# Patient Record
Sex: Male | Born: 1955
Health system: Southern US, Community
[De-identification: ages and names within clinical notes are randomized; demographics above are authoritative.]

## PROBLEM LIST (undated history)

## (undated) DIAGNOSIS — K853 Drug induced acute pancreatitis without necrosis or infection: Secondary | ICD-10-CM

## (undated) DIAGNOSIS — M1990 Unspecified osteoarthritis, unspecified site: Secondary | ICD-10-CM

## (undated) DIAGNOSIS — M48061 Spinal stenosis, lumbar region without neurogenic claudication: Secondary | ICD-10-CM

## (undated) DIAGNOSIS — M87059 Idiopathic aseptic necrosis of unspecified femur: Secondary | ICD-10-CM

## (undated) DIAGNOSIS — E559 Vitamin D deficiency, unspecified: Secondary | ICD-10-CM

## (undated) DIAGNOSIS — K5150 Left sided colitis without complications: Secondary | ICD-10-CM

## (undated) DIAGNOSIS — N529 Male erectile dysfunction, unspecified: Secondary | ICD-10-CM

## (undated) DIAGNOSIS — E119 Type 2 diabetes mellitus without complications: Secondary | ICD-10-CM

## (undated) DIAGNOSIS — K649 Unspecified hemorrhoids: Secondary | ICD-10-CM

## (undated) DIAGNOSIS — I10 Essential (primary) hypertension: Secondary | ICD-10-CM

## (undated) DIAGNOSIS — K529 Noninfective gastroenteritis and colitis, unspecified: Secondary | ICD-10-CM

## (undated) DIAGNOSIS — F553 Abuse of steroids or hormones: Secondary | ICD-10-CM

## (undated) HISTORY — DX: Left sided colitis without complications: K51.50

## (undated) HISTORY — DX: Drug induced acute pancreatitis without necrosis or infection: K85.30

## (undated) HISTORY — DX: Abuse of steroids or hormones: F55.3

## (undated) HISTORY — DX: Unspecified osteoarthritis, unspecified site: M19.90

## (undated) HISTORY — DX: Spinal stenosis, lumbar region without neurogenic claudication: M48.061

## (undated) HISTORY — PX: COLONOSCOPY: SHX174

## (undated) HISTORY — DX: Noninfective gastroenteritis and colitis, unspecified: K52.9

## (undated) HISTORY — PX: SIGMOIDOSCOPY: SUR1295

## (undated) HISTORY — PX: KNEE ARTHROSCOPY: SUR90

## (undated) HISTORY — DX: Vitamin D deficiency, unspecified: E55.9

## (undated) HISTORY — DX: Type 2 diabetes mellitus without complications: E11.9

## (undated) HISTORY — DX: Essential (primary) hypertension: I10

## (undated) HISTORY — DX: Idiopathic aseptic necrosis of unspecified femur: M87.059

## (undated) HISTORY — DX: Unspecified hemorrhoids: K64.9

## (undated) HISTORY — DX: Male erectile dysfunction, unspecified: N52.9

---

## 1999-09-21 ENCOUNTER — Emergency Department (HOSPITAL_COMMUNITY): Admission: EM | Admit: 1999-09-21 | Discharge: 1999-09-21 | Payer: Self-pay | Admitting: Emergency Medicine

## 2000-08-16 ENCOUNTER — Ambulatory Visit (HOSPITAL_COMMUNITY): Admission: RE | Admit: 2000-08-16 | Discharge: 2000-08-16 | Payer: Self-pay | Admitting: Specialist

## 2003-09-25 ENCOUNTER — Emergency Department (HOSPITAL_COMMUNITY): Admission: EM | Admit: 2003-09-25 | Discharge: 2003-09-25 | Payer: Self-pay | Admitting: *Deleted

## 2004-03-14 ENCOUNTER — Encounter: Payer: Self-pay | Admitting: Internal Medicine

## 2004-04-07 ENCOUNTER — Ambulatory Visit: Payer: Self-pay | Admitting: Internal Medicine

## 2004-06-23 ENCOUNTER — Ambulatory Visit: Payer: Self-pay | Admitting: Internal Medicine

## 2004-08-01 ENCOUNTER — Ambulatory Visit: Payer: Self-pay | Admitting: Family Medicine

## 2004-08-10 ENCOUNTER — Ambulatory Visit: Payer: Self-pay | Admitting: Family Medicine

## 2004-08-24 ENCOUNTER — Ambulatory Visit: Payer: Self-pay | Admitting: Family Medicine

## 2005-03-13 ENCOUNTER — Ambulatory Visit: Payer: Self-pay | Admitting: Internal Medicine

## 2005-05-08 ENCOUNTER — Ambulatory Visit: Payer: Self-pay | Admitting: Internal Medicine

## 2005-09-04 ENCOUNTER — Ambulatory Visit: Payer: Self-pay | Admitting: Family Medicine

## 2005-09-11 ENCOUNTER — Ambulatory Visit: Payer: Self-pay | Admitting: Family Medicine

## 2006-04-16 ENCOUNTER — Ambulatory Visit: Payer: Self-pay | Admitting: Internal Medicine

## 2006-09-11 ENCOUNTER — Ambulatory Visit: Payer: Self-pay | Admitting: Family Medicine

## 2006-09-11 LAB — CONVERTED CEMR LAB
ALT: 25 units/L (ref 0–40)
AST: 32 units/L (ref 0–37)
Albumin: 3.7 g/dL (ref 3.5–5.2)
Alkaline Phosphatase: 40 units/L (ref 39–117)
BUN: 12 mg/dL (ref 6–23)
Basophils Absolute: 0 10*3/uL (ref 0.0–0.1)
Basophils Relative: 0.4 % (ref 0.0–1.0)
Bilirubin, Direct: 0.1 mg/dL (ref 0.0–0.3)
CO2: 32 meq/L (ref 19–32)
Calcium: 9.3 mg/dL (ref 8.4–10.5)
Chloride: 106 meq/L (ref 96–112)
Cholesterol: 165 mg/dL (ref 0–200)
Creatinine, Ser: 0.9 mg/dL (ref 0.4–1.5)
Eosinophils Absolute: 0.4 10*3/uL (ref 0.0–0.6)
Eosinophils Relative: 8.8 % — ABNORMAL HIGH (ref 0.0–5.0)
GFR calc Af Amer: 114 mL/min
GFR calc non Af Amer: 94 mL/min
Glucose, Bld: 88 mg/dL (ref 70–99)
HCT: 42.2 % (ref 39.0–52.0)
HDL: 45.8 mg/dL (ref >39.0)
Hemoglobin: 14.4 g/dL (ref 13.0–17.0)
LDL Cholesterol: 107 mg/dL — ABNORMAL HIGH (ref 0–99)
Lymphocytes Relative: 29.3 % (ref 12.0–46.0)
MCHC: 34.2 g/dL (ref 30.0–36.0)
MCV: 90.9 fL (ref 78.0–100.0)
Monocytes Absolute: 0.5 10*3/uL (ref 0.2–0.7)
Monocytes Relative: 11.6 % — ABNORMAL HIGH (ref 3.0–11.0)
Neutro Abs: 2.3 10*3/uL (ref 1.4–7.7)
Neutrophils Relative %: 49.9 % (ref 43.0–77.0)
PSA: 0.5 ng/mL (ref 0.10–4.00)
Platelets: 231 10*3/uL (ref 150–400)
Potassium: 3.8 meq/L (ref 3.5–5.1)
RBC: 4.64 M/uL (ref 4.22–5.81)
RDW: 12.1 % (ref 11.5–14.6)
Sodium: 142 meq/L (ref 135–145)
TSH: 1.19 microintl units/mL (ref 0.35–5.50)
Total Bilirubin: 0.9 mg/dL (ref 0.3–1.2)
Total CHOL/HDL Ratio: 3.6
Total Protein: 7.2 g/dL (ref 6.0–8.3)
Triglycerides: 60 mg/dL (ref 0–149)
VLDL: 12 mg/dL (ref 0–40)
WBC: 4.5 10*3/uL (ref 4.5–10.5)

## 2006-09-16 ENCOUNTER — Ambulatory Visit: Payer: Self-pay | Admitting: Family Medicine

## 2006-12-24 ENCOUNTER — Ambulatory Visit: Payer: Self-pay | Admitting: Internal Medicine

## 2006-12-24 LAB — CONVERTED CEMR LAB
Bilirubin, Direct: 0.1 mg/dL (ref 0.0–0.3)
CRP, High Sensitivity: 1 (ref 0.00–5.00)
Calcium: 9.1 mg/dL (ref 8.4–10.5)
Eosinophils Absolute: 0.2 10*3/uL (ref 0.0–0.6)
Eosinophils Relative: 3.5 % (ref 0.0–5.0)
GFR calc Af Amer: 90 mL/min
GFR calc non Af Amer: 75 mL/min
Glucose, Bld: 144 mg/dL — ABNORMAL HIGH (ref 70–99)
Lymphocytes Relative: 23.7 % (ref 12.0–46.0)
MCHC: 35.8 g/dL (ref 30.0–36.0)
MCV: 90.1 fL (ref 78.0–100.0)
Neutro Abs: 3.4 10*3/uL (ref 1.4–7.7)
Neutrophils Relative %: 63.1 % (ref 43.0–77.0)
Platelets: 191 10*3/uL (ref 150–400)
Sodium: 138 meq/L (ref 135–145)
WBC: 5.4 10*3/uL (ref 4.5–10.5)

## 2007-01-29 ENCOUNTER — Ambulatory Visit: Payer: Self-pay | Admitting: Internal Medicine

## 2007-03-26 ENCOUNTER — Ambulatory Visit: Payer: Self-pay | Admitting: Internal Medicine

## 2007-07-22 ENCOUNTER — Emergency Department (HOSPITAL_COMMUNITY): Admission: EM | Admit: 2007-07-22 | Discharge: 2007-07-22 | Payer: Self-pay | Admitting: Emergency Medicine

## 2007-08-05 ENCOUNTER — Ambulatory Visit: Payer: Self-pay | Admitting: Internal Medicine

## 2007-10-29 ENCOUNTER — Ambulatory Visit: Payer: Self-pay | Admitting: Family Medicine

## 2007-10-29 ENCOUNTER — Telehealth: Payer: Self-pay | Admitting: Family Medicine

## 2007-10-29 LAB — CONVERTED CEMR LAB
Albumin: 3.6 g/dL (ref 3.5–5.2)
Alkaline Phosphatase: 36 units/L — ABNORMAL LOW (ref 39–117)
BUN: 12 mg/dL (ref 6–23)
Eosinophils Absolute: 0.4 10*3/uL (ref 0.0–0.7)
Eosinophils Relative: 9.7 % — ABNORMAL HIGH (ref 0.0–5.0)
GFR calc Af Amer: 90 mL/min
GFR calc non Af Amer: 74 mL/min
HCT: 43.1 % (ref 39.0–52.0)
HDL: 30.6 mg/dL — ABNORMAL LOW (ref >39.0)
MCHC: 33.8 g/dL (ref 30.0–36.0)
MCV: 92.2 fL (ref 78.0–100.0)
Monocytes Absolute: 0.4 10*3/uL (ref 0.1–1.0)
Platelets: 218 10*3/uL (ref 150–400)
Potassium: 3.9 meq/L (ref 3.5–5.1)
RDW: 12.6 % (ref 11.5–14.6)
Sodium: 141 meq/L (ref 135–145)
Triglycerides: 68 mg/dL (ref 0–149)

## 2007-11-05 ENCOUNTER — Ambulatory Visit: Payer: Self-pay | Admitting: Family Medicine

## 2008-08-11 ENCOUNTER — Ambulatory Visit: Payer: Self-pay | Admitting: Internal Medicine

## 2008-08-11 DIAGNOSIS — K5100 Ulcerative (chronic) pancolitis without complications: Secondary | ICD-10-CM | POA: Insufficient documentation

## 2008-08-11 DIAGNOSIS — K649 Unspecified hemorrhoids: Secondary | ICD-10-CM | POA: Insufficient documentation

## 2008-08-11 DIAGNOSIS — K5150 Left sided colitis without complications: Secondary | ICD-10-CM | POA: Insufficient documentation

## 2008-08-12 ENCOUNTER — Ambulatory Visit: Payer: Self-pay | Admitting: Internal Medicine

## 2009-12-22 ENCOUNTER — Telehealth: Payer: Self-pay | Admitting: Internal Medicine

## 2009-12-22 ENCOUNTER — Ambulatory Visit: Payer: Self-pay | Admitting: Family Medicine

## 2009-12-22 LAB — CONVERTED CEMR LAB
ALT: 25 units/L (ref 0–53)
AST: 29 units/L (ref 0–37)
Albumin: 3.8 g/dL (ref 3.5–5.2)
Alkaline Phosphatase: 41 units/L (ref 39–117)
Basophils Relative: 0.3 % (ref 0.0–3.0)
Cholesterol: 156 mg/dL (ref 0–200)
GFR calc non Af Amer: 97.25 mL/min (ref >60)
Glucose, Bld: 93 mg/dL (ref 70–99)
HDL: 35 mg/dL — ABNORMAL LOW (ref >39.00)
Hemoglobin, Urine: NEGATIVE
Ketones, ur: NEGATIVE mg/dL
Lymphs Abs: 1.4 10*3/uL (ref 0.7–4.0)
MCHC: 34.4 g/dL (ref 30.0–36.0)
MCV: 92.5 fL (ref 78.0–100.0)
Neutro Abs: 2.6 10*3/uL (ref 1.4–7.7)
Neutrophils Relative %: 53.1 % (ref 43.0–77.0)
Platelets: 216 10*3/uL (ref 150.0–400.0)
Potassium: 3.9 meq/L (ref 3.5–5.1)
RDW: 13.3 % (ref 11.5–14.6)
Sodium: 143 meq/L (ref 135–145)
Total Protein, Urine: NEGATIVE mg/dL
Total Protein: 7.1 g/dL (ref 6.0–8.3)
Triglycerides: 41 mg/dL (ref 0.0–149.0)
Urine Glucose: NEGATIVE mg/dL
VLDL: 8.2 mg/dL (ref 0.0–40.0)
WBC: 4.9 10*3/uL (ref 4.5–10.5)

## 2010-01-08 ENCOUNTER — Emergency Department (HOSPITAL_COMMUNITY): Admission: EM | Admit: 2010-01-08 | Discharge: 2010-01-08 | Payer: Self-pay | Admitting: Emergency Medicine

## 2010-01-10 ENCOUNTER — Ambulatory Visit: Payer: Self-pay | Admitting: Family Medicine

## 2010-01-12 ENCOUNTER — Emergency Department (HOSPITAL_COMMUNITY): Admission: EM | Admit: 2010-01-12 | Discharge: 2010-01-12 | Payer: Self-pay | Admitting: Family Medicine

## 2010-01-16 ENCOUNTER — Emergency Department (HOSPITAL_COMMUNITY): Admission: EM | Admit: 2010-01-16 | Discharge: 2010-01-16 | Payer: Self-pay | Admitting: Emergency Medicine

## 2010-01-17 ENCOUNTER — Telehealth: Payer: Self-pay | Admitting: Family Medicine

## 2010-01-19 ENCOUNTER — Ambulatory Visit: Payer: Self-pay | Admitting: Family Medicine

## 2010-01-27 ENCOUNTER — Encounter: Payer: Self-pay | Admitting: Family Medicine

## 2010-01-27 ENCOUNTER — Telehealth: Payer: Self-pay | Admitting: Family Medicine

## 2010-01-31 ENCOUNTER — Telehealth: Payer: Self-pay | Admitting: *Deleted

## 2010-02-15 ENCOUNTER — Ambulatory Visit: Payer: Self-pay | Admitting: Internal Medicine

## 2010-06-12 ENCOUNTER — Telehealth: Payer: Self-pay | Admitting: Internal Medicine

## 2010-06-13 ENCOUNTER — Other Ambulatory Visit: Payer: Self-pay | Admitting: Nurse Practitioner

## 2010-06-13 ENCOUNTER — Ambulatory Visit
Admission: RE | Admit: 2010-06-13 | Discharge: 2010-06-13 | Payer: Self-pay | Source: Home / Self Care | Attending: Gastroenterology | Admitting: Gastroenterology

## 2010-06-13 ENCOUNTER — Encounter: Payer: Self-pay | Admitting: Nurse Practitioner

## 2010-06-13 LAB — BASIC METABOLIC PANEL
BUN: 12 mg/dL (ref 6–23)
CO2: 31 mEq/L (ref 19–32)
Calcium: 9.2 mg/dL (ref 8.4–10.5)
Chloride: 105 mEq/L (ref 96–112)
Creatinine, Ser: 1 mg/dL (ref 0.4–1.5)
GFR: 104.12 mL/min (ref >60.00)
Glucose, Bld: 89 mg/dL (ref 70–99)
Potassium: 3.9 mEq/L (ref 3.5–5.1)
Sodium: 140 mEq/L (ref 135–145)

## 2010-06-13 LAB — CBC WITH DIFFERENTIAL/PLATELET
Basophils Absolute: 0 10*3/uL (ref 0.0–0.1)
Basophils Relative: 0.3 % (ref 0.0–3.0)
Eosinophils Absolute: 0.3 10*3/uL (ref 0.0–0.7)
Eosinophils Relative: 7.3 % — ABNORMAL HIGH (ref 0.0–5.0)
HCT: 43 % (ref 39.0–52.0)
Hemoglobin: 14.8 g/dL (ref 13.0–17.0)
Lymphocytes Relative: 33 % (ref 12.0–46.0)
Lymphs Abs: 1.5 10*3/uL (ref 0.7–4.0)
MCHC: 34.3 g/dL (ref 30.0–36.0)
MCV: 94 fl (ref 78.0–100.0)
Monocytes Absolute: 0.6 10*3/uL (ref 0.1–1.0)
Monocytes Relative: 14.1 % — ABNORMAL HIGH (ref 3.0–12.0)
Neutro Abs: 2 10*3/uL (ref 1.4–7.7)
Neutrophils Relative %: 45.3 % (ref 43.0–77.0)
Platelets: 200 10*3/uL (ref 150.0–400.0)
RBC: 4.58 Mil/uL (ref 4.22–5.81)
RDW: 13.1 % (ref 11.5–14.6)
WBC: 4.4 10*3/uL — ABNORMAL LOW (ref 4.5–10.5)

## 2010-06-14 ENCOUNTER — Encounter: Payer: Self-pay | Admitting: Family Medicine

## 2010-06-27 ENCOUNTER — Ambulatory Visit
Admission: RE | Admit: 2010-06-27 | Discharge: 2010-06-27 | Payer: Self-pay | Source: Home / Self Care | Attending: Internal Medicine | Admitting: Internal Medicine

## 2010-07-04 NOTE — Progress Notes (Signed)
Summary: Med refills/ I have forms he dropped off   Phone Note Call from Patient   Call For: Dr Carlean Purl Summary of Call: Patient walked in to drop off a mail order form for his Asacol refill. He would like [place] to complete & fax and then mail him the original for his records please. I noticed it had already ben a year since his last follow up, so I went ahead and scheduled hima follow up for Sept 14. Initial call taken by: Irwin Brakeman [facility],  December 22, 2009 8:06 AM  Follow-up for Phone Call        LM to [facility] at home number. Abelino Derrick CMA Deborra Medina)  December 22, 2009 2:21 PM  Pt left voicemail on my phone at 2:58pm. RC to pt, he received last refill about one month ago and has quite a few pills left.  Advised pt as he is past due for a follow up he I can only fill for one month until he has an appt.  He is agreeable.  He will call me when he is closer to running out and I will call him in a refill. Follow-up by: Abelino Derrick CMA Deborra Medina),  December 22, 2009 3:55 PM

## 2010-07-04 NOTE — Assessment & Plan Note (Signed)
Summary: CPX/CJR   Vital Signs:  Patient profile:   55 year old male Height:      70.25 inches Weight:      250 pounds BMI:     35.75 Temp:     98.5 degrees F oral BP sitting:   160 / 110  (left arm) Cuff size:   regular  Vitals Entered By: Westley Hummer CMA Deborra Medina) (January 10, 2010 9:35 AM) CC: cpx Is Patient Diabetic? No   Primary Care Provider:  Stevie Kern, MD  CC:  cpx.  History of Present Illness: Angel Frazier is a 55 year old male, single, nonsmoker, who comes in today for evaluation of multiple issues.  He has a history of underlying left-sided ulcer of colitis, currently on Asacol 1600 mg twice daily, and relatively asymptomatic.  He is due to go back and see his GI this August.  He takes fiber 100 mg p.r.n. for ED.  This last Saturday he noticed some discomfort in the right side of his back.  It seemed to get better throughout the day.  He went to work on Sunday.  He took some Motrin and felt fine until bedtime when all of a sudden his pain became a 10+.  It was so painful.  He called 911 and went to the emergency room.  In the emergency room he had an evaluation.  They gave him oxycodone Motrin and Valium.  He states today he is about 90% pain free.  No history of trauma.  He gets routine eye care.  Dental care.  Tetanus 2003.  BP today 160/110.  He's had a history in the past to sensitivity to Motrin.  He taken OTC Motrin and this made his blood pressure go up.  When he stops the Motrin.  Blood pressure drops back to normal.  He continues to exercise at the gym on a regular basis.  He is not taking any supplements.  Allergies (verified): No Known Drug Allergies  Past History:  Past medical, surgical, family and social histories (including risk factors) reviewed, and no changes noted (except as noted below).  Past Medical History: Reviewed history from 08/05/2007 and no changes required. ED Left-sided Ulcerative Colitis Hypertension Hemorrhoids (Bleeding) Prior  anabolic steroid use  Past Surgical History: Reviewed history from 08/11/2008 and no changes required. Knee Arthroscopy  Family History: Reviewed history from 08/11/2008 and no changes required. Family History Diabetes 1st degree relative Family History Hypertension Family History of Respiratory disease No FH of Colon Cancer:  Social History: Reviewed history from 08/11/2008 and no changes required. Married 1 son Former Smoker Alcohol use-yes occ Drug use-no Regular exercise-yes, has been a bodybuilder/weightlifter  Review of Systems      See HPI  Physical Exam  General:  Well-developed,well-nourished,in no acute distress; alert,appropriate and cooperative throughout examination Head:  Normocephalic and atraumatic without obvious abnormalities. No apparent alopecia or balding. Eyes:  No corneal or conjunctival inflammation noted. EOMI. Perrla. Funduscopic exam benign, without hemorrhages, exudates or papilledema. Vision grossly normal. Ears:  External ear exam shows no significant lesions or deformities.  Otoscopic examination reveals clear canals, tympanic membranes are intact bilaterally without bulging, retraction, inflammation or discharge. Hearing is grossly normal bilaterally. Nose:  External nasal examination shows no deformity or inflammation. Nasal mucosa are pink and moist without lesions or exudates. Mouth:  Oral mucosa and oropharynx without lesions or exudates.  Teeth in good repair. Neck:  No deformities, masses, or tenderness noted. Chest Wall:  No deformities, masses, tenderness or gynecomastia noted.  Breasts:  No masses or gynecomastia noted Lungs:  Normal respiratory effort, chest expands symmetrically. Lungs are clear to auscultation, no crackles or wheezes. Heart:  Normal rate and regular rhythm. S1 and S2 normal without gallop, murmur, click, rub or other extra sounds. Abdomen:  Bowel sounds positive,abdomen soft and non-tender without masses, organomegaly  or hernias noted. Rectal:  No external abnormalities noted. Normal sphincter tone. No rectal masses or tenderness. Genitalia:  Testes bilaterally descended without nodularity, tenderness or masses. No scrotal masses or lesions. No penis lesions or urethral discharge. Prostate:  Prostate gland firm and smooth, no enlargement, nodularity, tenderness, mass, asymmetry or induration. Msk:  No deformity or scoliosis noted of thoracic or lumbar spine.   Pulses:  R and L carotid,radial,femoral,dorsalis pedis and posterior tibial pulses are full and equal bilaterally Extremities:  No clubbing, cyanosis, edema, or deformity noted with normal full range of motion of all joints.   Neurologic:  No cranial nerve deficits noted. Station and gait are normal. Plantar reflexes are down-going bilaterally. DTRs are symmetrical throughout. Sensory, motor and coordinative functions appear intact. Skin:  Intact without suspicious lesions or rashes Cervical Nodes:  No lymphadenopathy noted Axillary Nodes:  No palpable lymphadenopathy Inguinal Nodes:  No significant adenopathy Psych:  Cognition and judgment appear intact. Alert and cooperative with normal attention span and concentration. No apparent delusions, illusions, hallucinations   Impression & Recommendations:  Problem # 1:  HYPERTENSION (ICD-401.9) Assessment Comment Only  Orders: Prescription Created Electronically [phone]) EKG w/ Interpretation (93000)  Problem # 2:  Preventive Health Care (ICD-V70.0) Assessment: Unchanged  Complete Medication List: 1)  Asacol 400 Mg Tbec (Mesalamine) .... 4 tabs two times a day 2)  Viagra 100 Mg Tabs (Sildenafil citrate) .... Take one tab one hour prior 3)  Fish Oil 300 Mg Caps (Omega-3 fatty acids) .... 2 gel caps once daily 4)  Oxycodone-acetaminophen 5-325 Mg Tabs (Oxycodone-acetaminophen) .... Take one to two tabs by mouth every six hours as needed for pain 5)  Diazepam 5 Mg Tabs (Diazepam) .... Take one tab by  mouth every 6 hours as needed for pain  Patient Instructions: 1)  did not take any Motrin, Aleve, Advil, etc..  Only take two Tylenol 3 times a day as needed and a half of Valium and/or oxycodone at bedtime as needed for low back pain. 2)  Check y  blood pressure daily for the next two weeks to be sure your blood pressure drops back to normal.  If  after two weeks, you getting consistently elevated pressures.  Return to see me with the data and  the device. 3)  Please schedule a follow-up appointment in 1 year. 4)  If you could become pregnant, take a multivitamin with folic acid every day. 5)  Take an Aspirin every day. Prescriptions: VIAGRA 100 MG  TABS (SILDENAFIL CITRATE) take one tab one hour prior  #6 x 11   Entered and Authorized by:   Dorena Cookey MD   Signed by:   Dorena Cookey MD on 01/10/2010   Method used:   Faxed to ...       [facility] (mail-order)             , [place]         Ph: [phone]       Fax: [phone]   RxID:   [id number]

## 2010-07-04 NOTE — Progress Notes (Signed)
Summary: paperwork? LMTCB 01-17-10  Phone Note Call from Patient Call back at Home Phone [phone]   Caller: vm Summary of Call: Paperwork needed.  Do I need appt.? Left message to call back.  Line not identified name or number. Shelbie Hutching, RN  January 17, 2010 12:49 PM  Initial call taken by: Shelbie Hutching, RN,  January 17, 2010 12:34 PM  Follow-up for Phone Call        spoke with patient  Follow-up by: Westley Hummer CMA Deborra Medina),  January 18, 2010 12:16 PM    Prescriptions: VIAGRA 100 MG  TABS (SILDENAFIL CITRATE) take one tab one hour prior  #6 x 11   Entered by:   Westley Hummer CMA ([place])   Authorized by:   Dorena Cookey MD   Signed by:   Westley Hummer CMA ([place]) on 01/18/2010   Method used:   Electronically to        [facility]  [phone]* (retail)       [address]       [place], [place]  [address]       Ph: [phone] or [phone]       Fax: [phone]   RxID:   [id number]

## 2010-07-04 NOTE — Progress Notes (Signed)
Summary: Prior Authorization for Viagra has been approved until 01-27-2011  Phone Note Outgoing Call   Summary of Call: prior authorization for Viagra has been approved by [facility] from 01-26-2010 until 01-27-2011 per mariah. Lmoam for the pt. Initial call taken by: Despina Arias,  January 27, 2010 3:26 PM

## 2010-07-04 NOTE — Progress Notes (Signed)
Summary: Pt req script for Viagra to be sent to [facility] mail order  Phone Note Refill Request Call back at Home Phone [phone] Message from:  Patient  Refills Requested: Medication #1:  VIAGRA 100 MG  TABS take one tab one hour prior   Dosage confirmed as above?Dosage Confirmed  Method Requested: Telephone to [facility].  Initial call taken by: Braulio Bosch,  January 31, 2010 10:00 AM    Prescriptions: VIAGRA 100 MG  TABS (SILDENAFIL CITRATE) take one tab one hour prior  #6 x 11   Entered by:   Clearnce Sorrel CMA   Authorized by:   Dorena Cookey MD   Signed by:   Clearnce Sorrel CMA on 01/31/2010   Method used:   Faxed to ...       [facility] (mail-order)             , [place]         Ph: [phone]       Fax: [phone]   RxID:   [id number]

## 2010-07-04 NOTE — Assessment & Plan Note (Signed)
Summary: Annual UC visit    History of Present Illness Visit Type: Follow-up Visit Primary GI MD: Silvano Rusk MD Primary Provider: Stevie Kern, MD Requesting Provider: n/a Chief Complaint: annual/ med refills History of Present Illness:   55 yo African-american man with longstanding ulcerative coitis. He is usually fine but if he has a hard stool will see a small amount of bright red blood on the toilet paper.   GI Review of Systems      Denies abdominal pain, acid reflux, belching, bloating, chest pain, dysphagia with liquids, dysphagia with solids, heartburn, loss of appetite, nausea, vomiting, vomiting blood, weight loss, and  weight gain.        Denies anal fissure, black tarry stools, change in bowel habit, constipation, diarrhea, diverticulosis, fecal incontinence, heme positive stool, hemorrhoids, irritable bowel syndrome, jaundice, light color stool, liver problems, rectal bleeding, and  rectal pain.    Clinical Reports Reviewed:  Colonoscopy:  03/14/2004:  Left-sided Ulceraticve Colitis  03/14/2004:  Results: Colitis. Location:  [facility].   Left colon bx chronic active colitos right colon bx normal  Flexible Sigmoidoscopy:  08/12/2008:  Results: Hemorrhoids.  ENDOSCOPIC IMPRESSION: 1) Small-medium internal hemorrhoids, which I think are the source of intermittent bleeding. 2) Normal colon, no endoscopically active ulcerative colitis.  Procedures Next Due Date:    Colonoscopy: 03/2012   Current Medications (verified): 1)  Asacol 400 Mg  Tbec (Mesalamine) .... 4 Tabs Two Times A Day 2)  Viagra 100 Mg  Tabs (Sildenafil Citrate) .... Take One Tab One Hour Prior 3)  Fish Oil 300 Mg Caps (Omega-3 Fatty Acids) .... 2 Gel Caps Once Daily 4)  Oxycodone-Acetaminophen 5-325 Mg Tabs (Oxycodone-Acetaminophen) .... Take One To Two Tabs By Mouth Every Six Hours As Needed For Pain 5)  Diazepam 5 Mg Tabs (Diazepam) .... Take One Tab By Mouth Every 6 Hours  As Needed For Pain  Allergies: No Known Drug Allergies  Past History:  Past Medical History: Reviewed history from 08/05/2007 and no changes required. ED Left-sided Ulcerative Colitis Hypertension Hemorrhoids (Bleeding) Prior anabolic steroid use  Past Surgical History: Reviewed history from 08/11/2008 and no changes required. Knee Arthroscopy  Family History: Reviewed history from 08/11/2008 and no changes required. Family History Diabetes 1st degree relative Family History Hypertension Family History of Respiratory disease No FH of Colon Cancer:  Social History: Reviewed history from 08/11/2008 and no changes required. Married 1 son Former Smoker Alcohol use-yes occ Drug use-no Regular exercise-yes, has been a bodybuilder/weightlifter  Vital Signs:  Patient profile:   55 year old male Height:      70.25 inches Weight:      250.25 pounds BMI:     35.78 Pulse rate:   100 / minute Pulse rhythm:   regular BP sitting:   132 / 84  (left arm) Cuff size:   large  Vitals Entered By: June McMurray Alpha Deborra Medina) (February 15, 2010 3:40 PM)  Physical Exam  General:  Well developed, well nourished, no acute distress.obese.   Eyes:  anicteric Lungs:  Clear throughout to auscultation. Heart:  Regular rate and rhythm; no murmurs, rubs,  or bruits. Abdomen:  Bowel sounds positive,abdomen soft and non-tender without masses, organomegaly or hernias noted.   Impression & Recommendations:  Problem # 1:  LEFT SIDED ULCERATIVE COLITIS (ICD-556.5) Assessment Unchanged Diagnosed 2004.  Doing well on Asacol, essentially siince that time. To continue Asacol 4.8 g daily. Repeat routine colonoscopy  2013  Problem # 2:  HEMORRHOIDS, WITH BLEEDING (  ICD-455.8) Assessment: Unchanged Rare bright red blood on toilet paper. No changes.  Patient Instructions: 1)  Please continue current medications.  2)  Please schedule a follow-up appointment in 1 year. 3)  If the rectal  bleeding becomesmore of a problem than it has been then let Dr. Carlean Purl know. 4)  Otherwise call for other gasrointestinal problems. 5)  The medication list was reviewed and reconciled.  All changed / newly prescribed medications were explained.  A complete medication list was provided to the patient / caregiver. Prescriptions: ASACOL 400 MG  TBEC (MESALAMINE) 4 tabs two times a day  #720 x 4   Entered and Authorized by:   Gatha Mayer MD, [facility]   Signed by:   Gatha Mayer MD, FACG on 02/15/2010   Method used:   Faxed to ...       [facility] (mail-order)             , [place]         Ph: [phone]       Fax: [phone]   RxID:   [id number]   Appended Document: Annual UC visit   Impression & Recommendations:  Problem # 1:  LEFT SIDED ULCERATIVE COLITIS (ICD-556.5) Assessment Comment Only asacol is at 3.6 grams daily

## 2010-07-04 NOTE — Medication Information (Signed)
Summary: Viagra Approved  Viagra Approved   Imported By: Laural Benes 02/02/2010 15:23:29  _____________________________________________________________________  External Attachment:    Type:   Image     Comment:   External Document

## 2010-07-06 NOTE — Assessment & Plan Note (Signed)
Summary: UC flare/sheri    History of Present Illness Visit Type: Follow-up Visit Primary GI MD: Silvano Rusk MD Primary Provider: Stevie Kern, MD Requesting Provider: n/a Chief Complaint: UC Flare History of Present Illness:   Patient is followed by Dr. Carlean Purl for history of ulcerative colitis. He was doing fine on Asacol at time of last office visit Sept. 2011. Recently started taking something OTC for right knee pain, it was something for his cartilage. After starting medication patient developed bloody diarrhea. Stopped medication a couple of weeks ago and bleeding improved with only minimal improvement in diarrhea.  He is averaging about 8 loose stools a day. No nocturnal diarrhea. No abdominal pain or fever. No nausea. No recent antibiotics.    GI Review of Systems    Reports belching and  bloating.      Denies abdominal pain, acid reflux, chest pain, dysphagia with liquids, dysphagia with solids, heartburn, loss of appetite, nausea, vomiting, vomiting blood, weight loss, and  weight gain.      Reports change in bowel habits, diarrhea, and  rectal bleeding.     Denies anal fissure, black tarry stools, constipation, diverticulosis, fecal incontinence, heme positive stool, hemorrhoids, irritable bowel syndrome, jaundice, light color stool, liver problems, and  rectal pain.    Current Medications (verified): 1)  Asacol 400 Mg  Tbec (Mesalamine) .... 4 Tabs Two Times A Day 2)  Viagra 100 Mg  Tabs (Sildenafil Citrate) .... Take One Tab One Hour Prior 3)  Fish Oil 300 Mg Caps (Omega-3 Fatty Acids) .... 2 Gel Caps Once Daily  Allergies (verified): No Known Drug Allergies  Past History:  Past Medical History: Last updated: 08/05/2007 ED Left-sided Ulcerative Colitis Hypertension Hemorrhoids (Bleeding) Prior anabolic steroid use  Past Surgical History: Last updated: 08/11/2008 Knee Arthroscopy  Family History: Last updated: 08/11/2008 Family History Diabetes 1st degree  relative Family History Hypertension Family History of Respiratory disease No FH of Colon Cancer:  Social History: Last updated: 08/11/2008 Married 1 son Former Smoker Alcohol use-yes occ Drug use-no Regular exercise-yes, has been a bodybuilder/weightlifter  Review of Systems  The patient denies allergy/sinus, anemia, anxiety-new, arthritis/joint pain, back pain, blood in urine, breast changes/lumps, change in vision, confusion, cough, coughing up blood, depression-new, fainting, fatigue, fever, headaches-new, hearing problems, heart murmur, heart rhythm changes, itching, muscle pains/cramps, night sweats, nosebleeds, shortness of breath, skin rash, sleeping problems, sore throat, swelling of feet/legs, swollen lymph glands, thirst - excessive, urination - excessive, urination changes/pain, urine leakage, vision changes, and voice change.    Vital Signs:  Patient profile:   55 year old male Height:      70.25 inches Weight:      252 pounds BMI:     36.03 BSA:     2.31 Pulse rate:   60 / minute Pulse rhythm:   regular BP sitting:   122 / 94  (left arm)  Vitals Entered By: [place] Deborra Medina) (June 13, 2010 9:34 AM)  Physical Exam  General:  Well developed, well nourished, no acute distress. Head:  Normocephalic and atraumatic. Eyes:  Conjunctiva pink, no icterus.  Neck:  no obvious masses  Lungs:  Clear throughout to auscultation. Heart:  Regular rate and rhythm; no murmurs, rubs,  or bruits. Abdomen:  Abdomen soft, nontender, nondistended. No obvious masses or hepatomegaly.Normal bowel sounds.  Rectal:  No fissures. Inflamed internal hemorrhoids on anoscopy.  Msk:  Symmetrical with no gross deformities. Normal posture. Extremities:  No palmar erythema, no edema.  Neurologic:  Alert and  oriented x4;  grossly normal neurologically. Skin:  Intact without significant lesions or rashes. Cervical Nodes:  No significant cervical adenopathy. Psych:  Alert and  cooperative. Normal mood and affect.   Impression & Recommendations:  Problem # 1:  LEFT SIDED ULCERATIVE COLITIS (ICD-556.5) Assessment Deteriorated Recent development of bloody diarrhea. Blood significantly improved but still having several loose BMs a day. He attributes onset of symptoms to starting what sounds like Chondroitin for knee pain but this doesn't seem likely. Could be infectious but suspect ulcerative colitis flare. His abdominal examination is totally benign. He has internal hemorroids on exam. Will check stool studies, CBC. Increase Asacol to 4.8 gms/ day, I don't feel he is acute enough for a course of steroids. Patient will follow up in a few weeks with Dr. Carlean Purl, hopefully his Asacol can be reduced back to 3.2 gms /day. In the meantime patient will call should develop abdominal pain or fevers.  Orders: TLB-CBC Platelet - w/Differential (85025-[id number]) TLB-BMP (Basic Metabolic Panel-BMET) ([id number]) T-C diff by PCR (35789) T-Culture, Stool ([id number])  Problem # 2:  HEMORRHOIDS, WITH BLEEDING (ICD-455.8) Assessment: Deteriorated Start steroid suppositories. Orders: TLB-CBC Platelet - w/Differential (85025-[id number]) TLB-BMP (Basic Metabolic Panel-BMET) ([id number]) T-C diff by PCR ([id number]) T-Culture, Stool ([id number])  Patient Instructions: 1)  Please go to lab, basement level. 2)  We gave you printed perscriptions for Hydrosortisone Suppositories and Asacol HD to take to your pharmacy. 3)  We have given you samples of Asacol HD and a savings card. 4)  We made you an appointment with Dr. Carlean Purl for 06-27-2010 at 9:30AM. 5)  Copy sent to : Stevie Kern, MD 6)  The medication list was reviewed and reconciled.  All changed / newly prescribed medications were explained.  A complete medication list was provided to the patient / caregiver. Prescriptions: HYDROCORTISONE ACETATE 25 MG SUPP (HYDROCORTISONE ACETATE) Use 1 suppository at bedtime x 1 week  #7 x  1   Entered by:   Marisue Humble NCMA   Authorized by:   Tye Savoy NP   Signed by:   Marisue Humble NCMA on 06/13/2010   Method used:   Print then Give to Patient   RxID:   [id number] ASACOL HD 800 MG TBEC (MESALAMINE) Take 2 tab 3 times daily  #180 x 2   Entered by:   Marisue Humble NCMA   Authorized by:   Tye Savoy NP   Signed by:   Marisue Humble NCMA on 06/13/2010   Method used:   Print then Give to Patient   RxID:   [id number]   Appended Document: UC flare/sheri Milca Sytsma--should recheck C. difficile toxin by PCR...????  Appended Document: UC flare/sheri I ordered stool studies (including C-Diff by PCR) when I saw patient in office today but I don't have any results yet. Thanks

## 2010-07-06 NOTE — Assessment & Plan Note (Signed)
Summary: F/U Rectal bleeding, saw Janne Napoleon ACNP    History of Present Illness Primary GI MD: Silvano Rusk MD Primary Provider: Stevie Kern, MD Requesting Provider: n/a Chief Complaint: F?U UC flare up after seeing Nevin Bloodgood. Pt states he still has around 3-5 bloody diarrhea BM's in a day. Pt states the amount of blood has reduced and the frequency of stools in a day since starting suppositories. Pt is out of them now.  History of Present Illness:   Saw NP 06/13/10 with diarrhea and rectal bleeding. hemorrhoids seen on anoscopy and hydrocortisone suppositories were used for 1 week. His Asacol was increased from 3.2 to 4.8 grams daily.  Stool studies negative. Rectal  bleeding is intermittemt, not with all stools. 3-5 loos and sometimes somewhat formed stools a day now.       GI Review of Systems      Denies abdominal pain, acid reflux, belching, bloating, chest pain, dysphagia with liquids, dysphagia with solids, heartburn, loss of appetite, nausea, vomiting, vomiting blood, weight loss, and  weight gain.      Reports diarrhea and  rectal bleeding.     Denies anal fissure, black tarry stools, change in bowel habit, constipation, diverticulosis, fecal incontinence, heme positive stool, hemorrhoids, irritable bowel syndrome, jaundice, light color stool, liver problems, and  rectal pain.    Current Medications (verified): 1)  Asacol Hd 800 Mg Tbec (Mesalamine) .... Take 2 Tab 3 Times Daily 2)  Viagra 100 Mg  Tabs (Sildenafil Citrate) .... Take One Tab One Hour Prior 3)  Fish Oil 300 Mg Caps (Omega-3 Fatty Acids) .... 2 Gel Caps Once Daily  Allergies (verified): No Known Drug Allergies  Past History:  Past Medical History: Reviewed history from 08/05/2007 and no changes required. ED Left-sided Ulcerative Colitis Hypertension Hemorrhoids (Bleeding) Prior anabolic steroid use  Past Surgical History: Reviewed history from 08/11/2008 and no changes required. Knee  Arthroscopy  Family History: Reviewed history from 08/11/2008 and no changes required. Family History Diabetes 1st degree relative Family History Hypertension Family History of Respiratory disease No FH of Colon Cancer:  Social History: Reviewed history from 08/11/2008 and no changes required. Married 1 son Former Smoker Alcohol use-yes occ Drug use-no Regular exercise-yes, has been a bodybuilder/weightlifter  Vital Signs:  Patient profile:   55 year old male Height:      70.25 inches Weight:      253.38 pounds BMI:     36.23 Pulse rate:   66 / minute Pulse rhythm:   regular BP sitting:   144 / 84  (left arm) Cuff size:   large  Vitals Entered By: Marlon Pel CMA Deborra Medina) (June 27, 2010 9:16 AM)  Physical Exam  General:  Well developed, well nourished, no acute distress. Lungs:  Clear throughout to auscultation. Heart:  Regular rate and rhythm; no murmurs, rubs,  or bruits. Abdomen:  Abdomen soft, nontender, nondistended. No obvious masses or hepatomegaly.Normal bowel sounds.  Rectal:  inspected, one anal tag no other abnormalities    Impression & Recommendations:  Problem # 1:  LEFT SIDED ULCERATIVE COLITIS (ICD-556.5) Assessment Improved diagnosed in 2004 Has mostly done well on ASA Tx with some prdnisone in past. Recent flare is improved but not resolved. he is to stay on 4.8 grams asacl/day and take Canasa 1000 mg nightly diet and nutrition handout from  [facility] given - he asked for this - advised that diet does not cure UC  Problem # 2:  HEMORRHOIDS, WITH BLEEDING (ICD-455.8) Assessment: Improved Canasa suppositories  and control of diarrhea should continue to help.  Patient Instructions: 1)  Copy sent to : Dr Sherren Mocha 2)  Please schedule a follow-up appointment in 4 to 6 weeks.  3)  Your prescription has been sent to the pharmacy. 4)  The medication list was reviewed and reconciled.  All changed / newly prescribed medications were explained.  A complete  medication list was provided to the patient / caregiver. Prescriptions: CANASA 1000 MG SUPP (MESALAMINE) insert one into rectum at bedtime for 1 month  #30 x 0   Entered and Authorized by:   Gatha Mayer MD, [facility]   Signed by:   Gatha Mayer MD, [facility] on 06/27/2010   Method used:   Electronically to        [facility] [phone]* (retail)       [address]       [place], [place]  [address]       Ph: [phone]       Fax: [phone]   RxID:   [id number]

## 2010-07-06 NOTE — Progress Notes (Signed)
Summary: Triage   Phone Note Call from Patient Call back at Home Phone [phone]   Caller: Patient Call For: Dr. Carlean Purl Reason for Call: Talk to Nurse Summary of Call: Pt is having a ulcerative colitis flare and needs to speak with nurse Initial call taken by: Martinique Johnson,  June 12, 2010 8:46 AM  Follow-up for Phone Call        Patient reprot 6-7 diarrhea stools with some rectal bleeding.  He is still taking asacol at ordered dose.  He reports that he has no cramping or urgency.  Patient will come in and see Tye Savoy RNP 06/13/10 9:30 Follow-up by: Barb Merino RN, CGRN,  June 12, 2010 10:39 AM

## 2010-08-07 ENCOUNTER — Encounter: Payer: Self-pay | Admitting: Internal Medicine

## 2010-08-07 ENCOUNTER — Ambulatory Visit (INDEPENDENT_AMBULATORY_CARE_PROVIDER_SITE_OTHER): Payer: BC Managed Care – PPO | Admitting: Internal Medicine

## 2010-08-07 DIAGNOSIS — K649 Unspecified hemorrhoids: Secondary | ICD-10-CM

## 2010-08-07 DIAGNOSIS — K5150 Left sided colitis without complications: Secondary | ICD-10-CM

## 2010-08-15 NOTE — Assessment & Plan Note (Signed)
Summary: FU 4-6 week    History of Present Illness Visit Type: Follow-up Visit Primary GI MD: Silvano Rusk MD Primary Provider: Stevie Kern, MD Requesting Provider: n/a Chief Complaint: Rectal bleeding History of Present Illness:   55 yo African-american man with left UC and known hemorrhoids in past. he has been having a flare of his UC over past few months that he thinks is controlled. He is having bright red blood streaked on the tissue paper and outside of stool most of the time. Stools are formed. No rectal pain.    GI Review of Systems      Denies abdominal pain, acid reflux, belching, bloating, chest pain, dysphagia with liquids, dysphagia with solids, heartburn, loss of appetite, nausea, vomiting, vomiting blood, weight loss, and  weight gain.      Reports rectal bleeding.     Denies anal fissure, black tarry stools, change in bowel habit, constipation, diarrhea, diverticulosis, fecal incontinence, heme positive stool, hemorrhoids, irritable bowel syndrome, jaundice, light color stool, liver problems, and  rectal pain.    Current Medications (verified): 1)  Asacol Hd 800 Mg Tbec (Mesalamine) .... Take 2 Tab 3 Times Daily 2)  Viagra 100 Mg  Tabs (Sildenafil Citrate) .... Take One Tab One Hour Prior 3)  Fish Oil 300 Mg Caps (Omega-3 Fatty Acids) .... 2 Gel Caps Once Daily 4)  Canasa 1000 Mg Supp (Mesalamine) .... Insert One Into Rectum At Bedtime For 1 Month 5)  Aspirin 81 Mg Tbec (Aspirin) .... Take 1 Tablet By Mouth Once Daily  Allergies (verified): No Known Drug Allergies  Past History:  Past Medical History: Reviewed history from 08/05/2007 and no changes required. ED Left-sided Ulcerative Colitis Hypertension Hemorrhoids (Bleeding) Prior anabolic steroid use  Past Surgical History: Reviewed history from 08/11/2008 and no changes required. Knee Arthroscopy  Family History: Reviewed history from 08/11/2008 and no changes required. Family History Diabetes 1st  degree relative Family History Hypertension Family History of Respiratory disease No FH of Colon Cancer:  Social History: Reviewed history from 08/11/2008 and no changes required. Married 1 son Former Smoker Alcohol use-yes occ Drug use-no Regular exercise-yes, has been a bodybuilder/weightlifter  Vital Signs:  Patient profile:   55 year old male Height:      70.25 inches Weight:      253 pounds BMI:     36.17 Pulse rate:   72 / minute Pulse rhythm:   regular BP sitting:   130 / 98  (left arm) Cuff size:   large  Vitals Entered By: June McMurray CMA Deborra Medina) (August 07, 2010 9:34 AM)  Physical Exam  General:  Well developed, well nourished, no acute distress. Rectal:  normal anoderm except very small tag nontender, no mass brown stool anoscopy shows inflamed hemorrhoids and some blood produced with anoscopy.    Impression & Recommendations:  Problem # 1:  HEMORRHOIDS, WITH BLEEDING (ICD-455.8) Assessment Improved Not  resolved though seems better I am not 989% certain that he does not have a component of proctitis. will try hydrocortisone suppositories x 1 month and if still bleeding then flex sig If persistent eorrhoids consider surgical evaluation for possible sclerosis or banding  Problem # 2:  LEFT SIDED ULCERATIVE COLITIS (ICD-556.5) Assessment: Unchanged diagnosed in 2004 Has mostly done well on ASA Tx with some prdnisone in past. Recent flare is probably resolved though with rectal bleeding I am not 100% convinced. He is to stay on 4.8 grams asacl/day. will see what happens with hydrocortisone suppositories this time  and if still bleeding then flex sig evaluation.   Patient Instructions: 1)  Copy sent to : Stevie Kern, MD 2)  Please pick up your new prescription from your pharmacy today 3)  You will use the Anucort nightly for 4 weeks 4)  Please call back in 4 weeks after the completion of your medication to let us know how your symptoms are. 5)  The  medication list was reviewed and reconciled.  All changed / newly prescribed medications were explained.  A complete medication list was provided to the patient / caregiver. Prescriptions: ANUCORT-HC 25 MG SUPP (HYDROCORTISONE ACETATE) insert 1 into rectum before bedtime daily for 1 month  #30 x 0   Entered and Authorized by:   Gatha Mayer MD, [facility]   Signed by:   Gatha Mayer MD, FACG on 08/07/2010   Method used:   Electronically to        [facility]  [phone]* (retail)       [address]       [place], [place]  [address]       Ph: [phone] or [phone]       Fax: [phone]   RxID:   [id number]

## 2010-09-04 ENCOUNTER — Telehealth: Payer: Self-pay | Admitting: Internal Medicine

## 2010-09-04 ENCOUNTER — Encounter: Payer: Self-pay | Admitting: Gastroenterology

## 2010-09-04 DIAGNOSIS — K625 Hemorrhage of anus and rectum: Secondary | ICD-10-CM

## 2010-09-04 NOTE — Telephone Encounter (Signed)
Left message for patient to call back  

## 2010-09-04 NOTE — Telephone Encounter (Signed)
Please schedule him for  a flex sig Dx to use is rectal bleeding and his ulcerative colitis dx

## 2010-09-04 NOTE — Telephone Encounter (Signed)
Error

## 2010-09-04 NOTE — Telephone Encounter (Signed)
Pt called back to let me know he gave me the wrong information, the medication that he was given that has not worked for the rectal bleeding was Hydrocort. Supp. Dr. Carlean Purl please advise.

## 2010-09-04 NOTE — Telephone Encounter (Signed)
Patient states that he was supposed to call back in 1 month to report how the [employer] are working. Pt states that he is still seeing blood on the tissue when he wipes. Reports it is bright red blood. Pt states that Dr. Carlean Purl mentioned doing a procedure if the continued. Dr. Carlean Purl please advise.

## 2010-09-05 NOTE — Telephone Encounter (Signed)
Pt is advised of Dr Celesta Aver recommendations.  Flex scheduled at [facility] for 09/19/10 9:15, pre-visit 09/11/10 9:00

## 2010-09-11 ENCOUNTER — Other Ambulatory Visit: Payer: Self-pay | Admitting: Internal Medicine

## 2010-09-11 ENCOUNTER — Ambulatory Visit (AMBULATORY_SURGERY_CENTER): Payer: BC Managed Care – PPO | Admitting: *Deleted

## 2010-09-11 VITALS — Ht 71.0 in | Wt 256.0 lb

## 2010-09-11 DIAGNOSIS — K625 Hemorrhage of anus and rectum: Secondary | ICD-10-CM

## 2010-09-11 MED ORDER — MESALAMINE 800 MG PO TBEC: 2.0000 | DELAYED_RELEASE_TABLET | Freq: Three times a day (TID) | ORAL | Status: DC

## 2010-09-11 NOTE — Telephone Encounter (Signed)
Patient needed refill on Asacol sent to [facility]

## 2010-09-19 ENCOUNTER — Other Ambulatory Visit: Payer: BC Managed Care – PPO | Admitting: Internal Medicine

## 2010-09-19 ENCOUNTER — Ambulatory Visit (HOSPITAL_COMMUNITY)
Admission: RE | Admit: 2010-09-19 | Discharge: 2010-09-19 | Disposition: A | Discharge status: Home / Self Care | Payer: BC Managed Care – PPO | Source: Ambulatory Visit | Attending: Internal Medicine | Admitting: Internal Medicine

## 2010-09-19 DIAGNOSIS — K644 Residual hemorrhoidal skin tags: Secondary | ICD-10-CM | POA: Insufficient documentation

## 2010-09-19 DIAGNOSIS — K648 Other hemorrhoids: Secondary | ICD-10-CM

## 2010-09-19 DIAGNOSIS — K5150 Left sided colitis without complications: Secondary | ICD-10-CM | POA: Insufficient documentation

## 2010-09-19 DIAGNOSIS — I10 Essential (primary) hypertension: Secondary | ICD-10-CM | POA: Insufficient documentation

## 2010-09-19 DIAGNOSIS — E119 Type 2 diabetes mellitus without complications: Secondary | ICD-10-CM | POA: Insufficient documentation

## 2010-09-19 DIAGNOSIS — K625 Hemorrhage of anus and rectum: Secondary | ICD-10-CM

## 2010-10-17 NOTE — Assessment & Plan Note (Signed)
[facility] OFFICE NOTE   NIK, GORRELL                          MRN:          [id number]  DATE:01/29/2007                            DOB:          Jun 01, 1956    CHIEF COMPLAINT:  Followup of left-sided ulcerative colitis.   He was in with a flare on July 22.  I put him on some prednisone, he  took a taper, he better with formed stools but occasionally still sees  some bright red blood per rectum.  His only medications are the Asacol  12 a day and fish oil at this time.   PAST MEDICAL HISTORY:  Reviewed and unchanged.  No abdominal pain or  fever.  No nausea or vomiting.   Weight 250 pounds, pulse 90, blood pressure 126/74.  RECTAL:  Exam shows some hemorrhoids in the canal.  I can see those in  the knee chest position.  He had a protruding one before.  There is no  perianal dermatitis or abscess or fistula.  He is alert and oriented x3.   ASSESSMENT:  1. Left-sided ulcerative colitis flare, seems to be resolved.  2. Some persistent bleeding which I think is hemorrhoidal.  He is a      weight lifter and I suspect that may be the case.  He dyspnea on      exertion have left-sided ulcerative colitis that could be playing a      role.   PLAN:  1. Continue current dose of Asacol and refill that.  2. Canasa suppositories, one every night for 2 weeks and then as      needed.  3. Return to see me in about 2 months to re-check.  4. If he has recurrent flares I think we need to consider repeat      sigmoidoscopy versus colonoscopy and possible immunomodulator      therapy.     Gatha Mayer, MD,FACG  Electronically Signed    CEG/MedQ  DD: 01/29/2007  DT: 01/29/2007  Job #: [id number]   cc:   Dellis Filbert A. Sherren Mocha, MD

## 2010-10-17 NOTE — Assessment & Plan Note (Signed)
[facility] OFFICE NOTE   MOMODOU, CONSIGLIO                          MRN:          [id number]  DATE:03/26/2007                            DOB:          [birth year]    CHIEF COMPLAINT:  Left-sided ulcerative colitis.   Kahle seems to be doing well on 12 Asacol a day.  He took some Canasa for  a while and he has had rare spots of red blood but no diarrhea, no  abdominal pain.  There are no other active problems.  He continues to  lift weights.   Height 5 feet, 11 inches, weight 256 pounds. Pulse 78, blood pressure  120/90.   PAST MEDICAL HISTORY:  Problems outlined previously.   ASSESSMENT:  I think his bleeding was hemorrhoidal as I suspected.  I  think whatever little blood he sees now is.  He has no diarrhea or pain  to suggest significantly symptomatic left-sided ulcerative colitis.   PLAN:  Continue Asacol. Can use the Canasa as needed. I want to see him  back in February, approximately 4 months.  If the bleeding picks up and  diagnostic uncertainty resurfaces, a flexible sigmoidoscopy will be  undertaken.     Gatha Mayer, MD,FACG  Electronically Signed    CEG/MedQ  DD: 03/26/2007  DT: 03/26/2007  Job #: [id number]   cc:   Dellis Filbert A. Sherren Mocha, MD

## 2010-10-17 NOTE — Assessment & Plan Note (Signed)
[facility] OFFICE NOTE   MAYUR, DUMAN                          MRN:          [id number]  DATE:12/24/2006                            DOB:          Mar 31, 1956    CHIEF COMPLAINT:  Diarrhea, slight bleeding.   PROBLEMS:  1. Left-sided ulcerative colitis, diagnosed 2004.  2. History of transient hypertension.  3. History of anabolic steroid use.  4. History of mild hyperglycemia in the past while on prednisone.  5. Prior history of anemia.  6. 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   MEDICATIONS:  1. Asacol, now on 12 tablets a day.  2. Fish oil, Omega 3 b.i.d.   DRUG ALLERGIES:  None known.   INTERVAL HISTORY:  Angel Frazier was seen in November.  He called about a month  ago stating he was having diarrhea with some streaks of red blood.  His  Asacol was increased to 12 a day.  He was given Rowasa enema at bedtime  for 14 days.  He has basically had 5 to 6 loose stools a day, which is a  change, from a couple of foreign bowel movements he has seen some  streaks of blood.  He denies pain or fever.  There are no sick contacts  or travel.  He has been raking his brain trying to figure out if it is  his diet, but he eats a very healthy diet.  His weight is stable.  He  has otherwise been well without any medical problems, he tells me.   He is not a smoker.   PHYSICAL EXAMINATION:  Well-developed , muscular black male.  Weight 244 pounds, pulse 64, blood pressure 128/88.  Eyes anicteric.  LUNGS:  Clear.  HEART:  S1, S2, no murmurs or gallops.  ABDOMEN:  Soft, nontender without organomegaly or masses.  Inspection of the rectal exam shows a skin tag and a small fresh  external hemorrhoid.  There is no obvious abscess.  Digital exam not  performed.  LOWER EXTREMITIES:  Free of edema.  SKIN:  Warm, dry, no acute rash.   ASSESSMENT:  Flare of left-sided ulcerative colitis seems likely.  Takeo  is  concerned because a friend developed stomach cancer and another  apparently had something like that as well.  I think this is just a  flare of his colitis.   PLAN:  1. He is reassured, though if he has persistent problems after we      treat him with prednisone, sigmoidoscopy or colonoscopy will be      considered.  He is not due for his initial surveillance exam until      2014.  2. Prednisone 40 mg daily for 5 days, then 30 mg daily for 5 days,      then 20 mg daily for 5 days, then 10 mg daily for 5 days, then 5 mg      daily for 10 days.  3. Followup in 1 month.  4. CBC, CMET, C-reactive protein today.  5. Further plans pending clinical course.  Immunomodulator therapy  with 6MP or azathioprine would probably be the next step. I think      endoscopic reassessment would be appropriate prior to initiating      that.     Gatha Mayer, MD,FACG  Electronically Signed    CEG/MedQ  DD: 12/24/2006  DT: 12/24/2006  Job #: [id number]   cc:   Dellis Filbert A. Sherren Mocha, MD

## 2010-10-17 NOTE — Assessment & Plan Note (Signed)
[facility] OFFICE NOTE   Angel Frazier, [name]                          MRN:          [id number]  DATE:08/05/2007                            DOB:          1955/10/16    CHIEF COMPLAINT:  Left-sided ulcerative colitis.   Angel Frazier is doing well on 12 Asacol a day.  He previously had rectal bleeding  that I thought was hemorrhoidal.  In the past 4 months he has not had  any bleeding, he has not had any diarrhea or abdominal pain.   He did have a flare up of some hip pain and could not walk well, that  was treated by Dr. Tonita Cong, apparently he had a spur and some inflammatory  changes.   MEDICATIONS:  1. Asacol 12 a day.  2. Fish oil b.i.d.  3. Motrin p.r.n.   DRUG ALLERGIES:  None known.   ASSESSMENT:  Left-sided ulcerative colitis, doing well.  He is on some  non-steroidal anti-inflammatory drugs, he seems to be tolerating that.  Will monitor things and see him back in a year.  Refill his Asacol.  Will see him sooner if he has a flare.   Note:  His weight is 254 pounds, pulse 84, blood pressure 142/96.  He  keeps primary care followup with Dr. Sherren Mocha.     Gatha Mayer, MD,FACG  Electronically Signed    CEG/MedQ  DD: 08/05/2007  DT: 08/05/2007  Job #: [id number]

## 2010-10-20 NOTE — Op Note (Signed)
[facility]  Patient:    Angel Frazier, Angel Frazier                      MRN: [id number] Proc. Date: 08/16/00 Adm. Date:  [id number] Attending:  Tye Savoy                           Operative Report  PREOPERATIVE DIAGNOSES:  Medial meniscus tear, degenerative joint disease right knee.  POSTOPERATIVE DIAGNOSES:  Medial meniscus tear, degenerative joint disease right knee.  Grade 3 chondromalacia medial femoral condyle, medial tibial plateau, lateral tibial plateau, patella, medial meniscus tear.  PROCEDURE PERFORMED:  Right knee arthroscopy complex to patella, medial femoral condyle, medial and lateral tibial plateau, partial medial meniscectomy.  BRIEF HISTORY AND INDICATION:  A 55 year old with refractory knee pain, swelling, giving-way.  MRI indicating complex medial meniscus tear, tricompartmental degenerative changes.  Operative intervention was indicated for partial medial meniscectomy and debridement and chondroplasty for pain, swelling.  Risks and benefits discussed including bleeding, infection, damage to surrounding structures, sustained swelling, need for treatment including arthroscopic debridement, cortical steroid injection, and inflammatory, etc.  TECHNIQUE:  The patient is in supine position.  After the induction of adequate general anesthesia, 1 gram of Kefzol, the right lower extremity was prepped and draped in the usual sterile fashion.  Superior medial and a lateral parapatellar portal was fashioned with #11 blade.  ______ cannulated atraumatically.  Place irrigant was utilized to insufflate the joint. Orthoscopic camera was then inserted via the lateral compartment.  Inspection of the suprapatellar pouch showed extensive grade 3 chondral changes to the patella.  There normal patellofemoral tracking.  Sulcus demonstrated slight grade 2 changes.  Under direct visualization, an 18 gauge needle was utilized to localize the medial  parapatellar portal which was then fashioned with the #11 blade, sparing the medial meniscus.  Extensive grade 3 changes were noted on the femoral condyle and the tibial plateau medially and extensive complex tear of the posterior horn of the medial meniscus was noted.  Basket rongeur was introduced via the medial portal and utilized to perform partial medial meniscectomy which was then further contoured and shaved to a stable base. Chondroplasties were performed on the medial femoral condyle and of the patella as well.  ______ ______ ______ this.  ACL and PCL were intact with some degenerative spurring of the intercondylar eminence.  Extensive grade 3 changes of the patella were noted, and the shaver was introduced and utilized to perform chondroplasty of patella.  In the lateral compartment, there was a meniscus with a mild frame without tearing but interestingly, fissures were throughout the lateral tibial plateau process.  [place] was introduced and utilized to shave and debride these fissures to stable bases.  Medial and lateral meniscus was stable to palpation.  Reexamination of the medial compartment revealed stable medial meniscus following resection and recontouring.  No loose cartilaginous debris.  The gutters were unremarkable. The wound was then copiously irrigated.  All instrumentation was removed.  The portals were closed with 4-0 nylon simple sutures.  Marcaine 0.25% with epinephrine was infiltrated into the joint.  The wound was dressed sterilely and secured with an ACE bandage.  The patient was awakened without difficulty and transported to the recovery room in satisfactory condition.  The patient tolerated the procedure well, and there were no complications. DD:  08/16/00 TD:  08/16/00 Job: [id number]

## 2010-10-20 NOTE — Assessment & Plan Note (Signed)
[place] OFFICE NOTE   Angel Frazier, Angel Frazier                          MRN:          [id number]  DATE:04/16/2006                            DOB:          11/16/1955    CHIEF COMPLAINT:  Follow-up of left-sided ulcerative colitis.   PROBLEMS:  1. Left-sided ulcerative colitis. Last colonoscopy March 22, 2004,      demonstrating left-sided ulcerative colitis, no dysplasia.  2. Prior history of transient hypertension.  3. History of anabolic steroid use.   Angel Frazier has done well.  I have not seen him in about a year.  He is on  Asacol 3 tablets (400 mg) three times a day for a total of 9 a day with good  control of his ulcerative colitis.  There is no abdominal pain, bleeding or  fever.   ALLERGIES:  NO KNOWN DRUG ALLERGIES.   MEDICATIONS:  No other medications.   SOCIAL HISTORY:  He has changed jobs, working at [employer] in [place] now.   PHYSICAL EXAMINATION:  GENERAL APPEARANCE:  A well-developed, muscular black  male in no acute distress.  VITAL SIGNS:  Weight 245 pounds, pulse 64, blood pressure 130/80.  HEENT:  Eyes anicteric.  LUNGS:  Clear.  CARDIOVASCULAR:  S1, S2, no murmurs or gallops.  ABDOMEN:  Soft and nontender.  No organomegaly or masses.  MENTAL STATUS:  Alert and oriented x3.   ASSESSMENT:  Left-sided ulcerative colitis doing well.   PLAN:  1. Continue Asacol at current dose.  2. He has a physical with Dr. Sherren Mocha in the first of the year and we will      get labs then, he tells me.  3. Return to see me in a year, sooner if having problems.  4. Colonoscopy in 2014, is on the recall list.  5. We will try to help him refill his Asacol through [facility].      Several boxes of samples are given today as well to tide him over.     Gatha Mayer, MD,FACG  Electronically Signed    CEG/MedQ  DD: 04/16/2006  DT: 04/16/2006  Job #: [id number]   cc:   Dellis Filbert A. Sherren Mocha, MD

## 2010-11-13 ENCOUNTER — Ambulatory Visit (INDEPENDENT_AMBULATORY_CARE_PROVIDER_SITE_OTHER): Payer: BC Managed Care – PPO | Admitting: Internal Medicine

## 2010-11-13 ENCOUNTER — Encounter: Payer: Self-pay | Admitting: Internal Medicine

## 2010-11-13 VITALS — BP 126/92 | HR 96 | Ht 71.0 in | Wt 253.8 lb

## 2010-11-13 DIAGNOSIS — K649 Unspecified hemorrhoids: Secondary | ICD-10-CM

## 2010-11-13 DIAGNOSIS — K5190 Ulcerative colitis, unspecified, without complications: Secondary | ICD-10-CM

## 2010-11-13 DIAGNOSIS — K5150 Left sided colitis without complications: Secondary | ICD-10-CM

## 2010-11-13 MED ORDER — MESALAMINE 800 MG PO TBEC: 2.0000 | DELAYED_RELEASE_TABLET | Freq: Three times a day (TID) | ORAL | Status: DC

## 2010-11-13 NOTE — Progress Notes (Signed)
55 yo African-american man with recent band ligation of internal hemorrhoids and left-sided ulcerative colitis. He has not had further bleeding since April 2012 hemorrhoid ligation. No diarrhea or abdominal pain. Asking for refill of Asacol HD.

## 2010-11-13 NOTE — Patient Instructions (Addendum)
Your Asacol was refilled through [facility]. Also samples were given # 3 boxes. Call back sooner than 1 year if you are having problems.

## 2010-11-13 NOTE — Assessment & Plan Note (Signed)
Doing well now - no active colitis on April 2012 sigmoidoscopy. Bleeding was hemorrhoidal. Continue Asacol 4.8 g total daily. Routine follow-up 1 year. Routine colonoscopy around 2015

## 2010-11-13 NOTE — Assessment & Plan Note (Signed)
Successfully treated with band ligation 09/2010

## 2010-11-16 ENCOUNTER — Other Ambulatory Visit: Payer: Self-pay | Admitting: Gastroenterology

## 2010-11-16 MED ORDER — MESALAMINE 800 MG PO TBEC: 2.0000 | DELAYED_RELEASE_TABLET | Freq: Three times a day (TID) | ORAL | Status: DC

## 2010-11-16 NOTE — Telephone Encounter (Signed)
Received a Rx refill request from [facility] mail order concerning the quantity of Asacol. Adjustment made and sent in to [facility] for Asacol 800 mg (2 tablet three times a day)  #540 with 3 refills.

## 2011-02-06 ENCOUNTER — Other Ambulatory Visit (INDEPENDENT_AMBULATORY_CARE_PROVIDER_SITE_OTHER): Payer: BC Managed Care – PPO

## 2011-02-06 DIAGNOSIS — Z0000 Encounter for general adult medical examination without abnormal findings: Secondary | ICD-10-CM

## 2011-02-06 LAB — HEPATIC FUNCTION PANEL
ALT: 24 U/L (ref 0–53)
Albumin: 3.8 g/dL (ref 3.5–5.2)
Alkaline Phosphatase: 37 U/L — ABNORMAL LOW (ref 39–117)
Bilirubin, Direct: 0.1 mg/dL (ref 0.0–0.3)
Total Protein: 7 g/dL (ref 6.0–8.3)

## 2011-02-06 LAB — POCT URINALYSIS DIPSTICK
Bilirubin, UA: NEGATIVE
Glucose, UA: NEGATIVE
Leukocytes, UA: NEGATIVE
Nitrite, UA: NEGATIVE

## 2011-02-06 LAB — CBC WITH DIFFERENTIAL/PLATELET
Basophils Relative: 0.3 % (ref 0.0–3.0)
Eosinophils Relative: 6.7 % — ABNORMAL HIGH (ref 0.0–5.0)
Hemoglobin: 14.4 g/dL (ref 13.0–17.0)
Lymphocytes Relative: 31.2 % (ref 12.0–46.0)
MCV: 94.3 fl (ref 78.0–100.0)
Neutro Abs: 2.7 10*3/uL (ref 1.4–7.7)
Neutrophils Relative %: 51.1 % (ref 43.0–77.0)
RBC: 4.51 Mil/uL (ref 4.22–5.81)
WBC: 5.3 10*3/uL (ref 4.5–10.5)

## 2011-02-06 LAB — BASIC METABOLIC PANEL
CO2: 30 mEq/L (ref 19–32)
Calcium: 8.9 mg/dL (ref 8.4–10.5)
Chloride: 103 mEq/L (ref 96–112)
Creatinine, Ser: 0.9 mg/dL (ref 0.4–1.5)
Sodium: 141 mEq/L (ref 135–145)

## 2011-02-06 LAB — PSA: PSA: 0.6 ng/mL (ref 0.10–4.00)

## 2011-02-06 LAB — TSH: TSH: 0.79 u[IU]/mL (ref 0.35–5.50)

## 2011-02-06 LAB — LIPID PANEL: Total CHOL/HDL Ratio: 3

## 2011-02-15 ENCOUNTER — Encounter: Payer: Self-pay | Admitting: Family Medicine

## 2011-02-15 ENCOUNTER — Ambulatory Visit (INDEPENDENT_AMBULATORY_CARE_PROVIDER_SITE_OTHER): Payer: BC Managed Care – PPO | Admitting: Family Medicine

## 2011-02-15 DIAGNOSIS — I10 Essential (primary) hypertension: Secondary | ICD-10-CM

## 2011-02-15 DIAGNOSIS — K5150 Left sided colitis without complications: Secondary | ICD-10-CM

## 2011-02-15 DIAGNOSIS — Z0000 Encounter for general adult medical examination without abnormal findings: Secondary | ICD-10-CM

## 2011-02-15 DIAGNOSIS — Z23 Encounter for immunization: Secondary | ICD-10-CM

## 2011-02-15 MED ORDER — SILDENAFIL CITRATE 100 MG PO TABS: 100.0000 mg | ORAL_TABLET | ORAL | Status: DC | PRN

## 2011-02-15 NOTE — Progress Notes (Signed)
  Subjective:    Patient ID: Angel Frazier, male    DOB: [birth year], 55 y.o.   MRN: [id number]  HPILeo is a 55 year old single male, nonsmoker, who comes in today for general physical examination because of a history of inflammatory bowel disease and erectile dysfunction.  He takes Asacol 1600 mg 3 times daily under the direction of Dr. Carlean Purl and is doing well  He uses fiber 100 mg p.r.n. For ED.  He, states he's having trouble swelling of his right, leg it's been going on and off for the past couple months.  He had knee surgery torn cartilage many years ago.  No history of chest pain, shortness of breath, etc..  The swelling comes and goes.  He is very conscious of his diet.  He exercises list weights and states on a salt free diet.  He gets routine eye care at the optometry shopped advised to see an ophthalmologist.  Greater dental care, colonoscopy, and GI by Dr. Carlean Purl, tetanus, 2003, seasonal flu shot today    Review of Systems  Constitutional: Negative.   HENT: Negative.   Eyes: Negative.   Respiratory: Negative.   Cardiovascular: Negative.   Gastrointestinal: Negative.   Genitourinary: Negative.   Musculoskeletal: Negative.   Skin: Negative.   Neurological: Negative.   Hematological: Negative.   Psychiatric/Behavioral: Negative.        Objective:   Physical Exam  Constitutional: He is oriented to person, place, and time. He appears well-developed and well-nourished.  HENT:  Head: Normocephalic and atraumatic.  Right Ear: External ear normal.  Left Ear: External ear normal.  Nose: Nose normal.  Mouth/Throat: Oropharynx is clear and moist.  Eyes: Conjunctivae and EOM are normal. Pupils are equal, round, and reactive to light.  Neck: Normal range of motion. Neck supple. No JVD present. No tracheal deviation present. No thyromegaly present.  Cardiovascular: Normal rate, regular rhythm, normal heart sounds and intact distal pulses.  Exam reveals no gallop and no  friction rub.   No murmur heard. Pulmonary/Chest: Effort normal and breath sounds normal. No stridor. No respiratory distress. He has no wheezes. He has no rales. He exhibits no tenderness.  Abdominal: Soft. Bowel sounds are normal. He exhibits no distension and no mass. There is no tenderness. There is no rebound and no guarding.  Genitourinary: Rectum normal, prostate normal and penis normal. Guaiac negative stool. No penile tenderness.  Musculoskeletal: Normal range of motion. He exhibits edema. He exhibits no tenderness.       1+ edema bilaterally.......Marland Kitchen Arterial pulses normal  Lymphadenopathy:    He has no cervical adenopathy.  Neurological: He is alert and oriented to person, place, and time. He has normal reflexes. No cranial nerve deficit. He exhibits normal muscle tone.  Skin: Skin is warm and dry. No rash noted. No erythema. No pallor.  Psychiatric: He has a normal mood and affect. His behavior is normal. Judgment and thought content normal.          Assessment & Plan:  Healthy male.  Ulcerative colitis, asymptomatic on medication.  History of erectile dysfunction continue viagra  Venous insufficiency lower extremities.  Patient declines a diuretic

## 2011-02-15 NOTE — Patient Instructions (Signed)
Continue your current good health habits and medications.  Follow-up in one year or sooner if any problems.  When you do for you next exam.  I would recommend Dr. Audry Pili

## 2011-04-25 ENCOUNTER — Encounter: Payer: Self-pay | Admitting: Internal Medicine

## 2011-10-11 ENCOUNTER — Other Ambulatory Visit: Payer: Self-pay

## 2011-10-11 MED ORDER — MESALAMINE 800 MG PO TBEC: 2.0000 | DELAYED_RELEASE_TABLET | Freq: Three times a day (TID) | ORAL | Status: DC

## 2011-10-11 NOTE — Telephone Encounter (Signed)
Refilled Asacol 879m 2tabs three times a day for a month and left pt message that he needs to call and set up yearly f/u visit with Dr. GCarlean Purl

## 2011-11-02 ENCOUNTER — Ambulatory Visit (INDEPENDENT_AMBULATORY_CARE_PROVIDER_SITE_OTHER): Payer: BC Managed Care – PPO | Admitting: Internal Medicine

## 2011-11-02 ENCOUNTER — Encounter: Payer: Self-pay | Admitting: Internal Medicine

## 2011-11-02 VITALS — BP 134/80 | HR 60 | Ht 71.0 in | Wt 256.0 lb

## 2011-11-02 DIAGNOSIS — K5150 Left sided colitis without complications: Secondary | ICD-10-CM

## 2011-11-02 DIAGNOSIS — K649 Unspecified hemorrhoids: Secondary | ICD-10-CM

## 2011-11-02 MED ORDER — MESALAMINE 800 MG PO TBEC: 2.0000 | DELAYED_RELEASE_TABLET | Freq: Three times a day (TID) | ORAL | Status: DC

## 2011-11-02 NOTE — Assessment & Plan Note (Signed)
No further problems.

## 2011-11-02 NOTE — Progress Notes (Signed)
  Subjective:    Patient ID: Angel Frazier, male    DOB: 04-Jan-1956, 56 y.o.   MRN: [id number]  HPI The patient is a middle-aged [place] American man followed for left-sided ulcerative colitis since 2004. He presents today without any complaints, doing well. When last seen in 2012 it had rectal bleeding which turned out to be hemorrhoids, they have responded nicely the ligation he does not have any rectal bleeding. He continues to have at least annual visits with Dr. Sherren Mocha.   Review of Systems Gaining weight, no other complaints    Objective:   Physical Exam General:  NAD Eyes:   anicteric Lungs:  clear Heart:  S1S2 no rubs, murmurs or gallops Abdomen:  soft and nontender, BS+ Ext:   no edema    Data Reviewed:  I reviewed his last chemistries, CBC and urinalysis from September 2012 and these are normal.        Assessment & Plan:

## 2011-11-02 NOTE — Patient Instructions (Signed)
Glad to see things are going well. The Asacol HD was refilled. See you in a year, sooner if needed. Gatha Mayer, MD, [facility]

## 2011-11-02 NOTE — Assessment & Plan Note (Addendum)
Is doing well on his current dose of Asacol at 4.8 g daily and we will continue that and see him annually, sooner as needed. Consider a colonoscopy in 2015 which would be tenuous and the diagnosis, he only has left-sided disease, I think overall that make sense because this interval for routine screening anyway.

## 2012-05-13 ENCOUNTER — Other Ambulatory Visit (INDEPENDENT_AMBULATORY_CARE_PROVIDER_SITE_OTHER): Payer: BC Managed Care – PPO

## 2012-05-13 DIAGNOSIS — Z0000 Encounter for general adult medical examination without abnormal findings: Secondary | ICD-10-CM

## 2012-05-13 LAB — POCT URINALYSIS DIPSTICK
Bilirubin, UA: NEGATIVE
Glucose, UA: NEGATIVE
Leukocytes, UA: NEGATIVE
Spec Grav, UA: 1.025

## 2012-05-13 LAB — CBC WITH DIFFERENTIAL/PLATELET
Basophils Relative: 0.3 % (ref 0.0–3.0)
Eosinophils Absolute: 0.4 10*3/uL (ref 0.0–0.7)
Lymphs Abs: 1.7 10*3/uL (ref 0.7–4.0)
MCHC: 33.5 g/dL (ref 30.0–36.0)
MCV: 93.6 fl (ref 78.0–100.0)
Monocytes Absolute: 0.5 10*3/uL (ref 0.1–1.0)
Neutro Abs: 2.4 10*3/uL (ref 1.4–7.7)
Neutrophils Relative %: 47.9 % (ref 43.0–77.0)
RBC: 4.62 Mil/uL (ref 4.22–5.81)

## 2012-05-13 LAB — TSH: TSH: 0.95 u[IU]/mL (ref 0.35–5.50)

## 2012-05-13 LAB — BASIC METABOLIC PANEL
BUN: 13 mg/dL (ref 6–23)
Calcium: 9.2 mg/dL (ref 8.4–10.5)
Creatinine, Ser: 1.1 mg/dL (ref 0.4–1.5)
GFR: 90.27 mL/min (ref >60.00)
Potassium: 3.9 mEq/L (ref 3.5–5.1)

## 2012-05-13 LAB — HEPATIC FUNCTION PANEL
Alkaline Phosphatase: 36 U/L — ABNORMAL LOW (ref 39–117)
Bilirubin, Direct: 0.1 mg/dL (ref 0.0–0.3)

## 2012-05-13 LAB — LIPID PANEL: Cholesterol: 146 mg/dL (ref 0–200)

## 2012-05-13 LAB — PSA: PSA: 0.82 ng/mL (ref 0.10–4.00)

## 2012-05-21 ENCOUNTER — Encounter: Payer: Self-pay | Admitting: Family Medicine

## 2012-05-21 ENCOUNTER — Ambulatory Visit (INDEPENDENT_AMBULATORY_CARE_PROVIDER_SITE_OTHER): Payer: BC Managed Care – PPO | Admitting: Family Medicine

## 2012-05-21 VITALS — BP 124/80 | Temp 97.7°F | Ht 71.0 in | Wt 261.0 lb

## 2012-05-21 DIAGNOSIS — I10 Essential (primary) hypertension: Secondary | ICD-10-CM

## 2012-05-21 DIAGNOSIS — Z23 Encounter for immunization: Secondary | ICD-10-CM

## 2012-05-21 DIAGNOSIS — K5150 Left sided colitis without complications: Secondary | ICD-10-CM

## 2012-05-21 DIAGNOSIS — N529 Male erectile dysfunction, unspecified: Secondary | ICD-10-CM | POA: Insufficient documentation

## 2012-05-21 MED ORDER — SILDENAFIL CITRATE 100 MG PO TABS: 100.0000 mg | ORAL_TABLET | ORAL | Status: DC | PRN

## 2012-05-21 MED ORDER — MESALAMINE 800 MG PO TBEC: 2.0000 | DELAYED_RELEASE_TABLET | Freq: Three times a day (TID) | ORAL | Status: DC

## 2012-05-21 NOTE — Patient Instructions (Addendum)
Continue your current medications  Followup with me in one year  Followup with Dr. Elder Cyphers her this summer unless you notice any change in your bowel habits or overt bleeding

## 2012-05-21 NOTE — Progress Notes (Signed)
  Subjective:    Patient ID: Angel Frazier, male    DOB: 08-06-1955, 56 y.o.   MRN: [id number]  HPI Angel Frazier is a 56 year old male single nonsmoker who comes in today for general physical exam because of a history of ulcerative colitis  He sees Dr. Arelia Frazier in the summer. His current medication is Asacol 1600 mg 3 times daily  He states he's asymptomatic no GI problems or symptoms bowel movements normal. He also uses Viagra when necessary for ED  He gets routine eye care, dental care, GI followup as outlined, tetanus 2003 booster today, seasonal flu shot today.  He works out daily at [employer] extremely muscular   Review of Systems  Constitutional: Negative.   HENT: Negative.   Eyes: Negative.   Respiratory: Negative.   Cardiovascular: Negative.   Gastrointestinal: Negative.   Genitourinary: Negative.   Musculoskeletal: Negative.   Skin: Negative.   Neurological: Negative.   Hematological: Negative.   Psychiatric/Behavioral: Negative.        Objective:   Physical Exam  Constitutional: He is oriented to person, place, and time. He appears well-developed and well-nourished.  HENT:  Head: Normocephalic and atraumatic.  Right Ear: External ear normal.  Left Ear: External ear normal.  Nose: Nose normal.  Mouth/Throat: Oropharynx is clear and moist.  Eyes: Conjunctivae normal and EOM are normal. Pupils are equal, round, and reactive to light.  Neck: Normal range of motion. Neck supple. No JVD present. No tracheal deviation present. No thyromegaly present.  Cardiovascular: Normal rate, regular rhythm, normal heart sounds and intact distal pulses.  Exam reveals no gallop and no friction rub.   No murmur heard. Pulmonary/Chest: Effort normal and breath sounds normal. No stridor. No respiratory distress. He has no wheezes. He has no rales. He exhibits no tenderness.  Abdominal: Soft. Bowel sounds are normal. He exhibits no distension and no mass. There is no tenderness. There is no rebound  and no guarding.  Genitourinary: Prostate normal and penis normal. Guaiac positive stool. No penile tenderness.  Musculoskeletal: Normal range of motion. He exhibits no edema and no tenderness.  Lymphadenopathy:    He has no cervical adenopathy.  Neurological: He is alert and oriented to person, place, and time. He has normal reflexes. No cranial nerve deficit. He exhibits normal muscle tone.  Skin: Skin is warm and dry. No rash noted. No erythema. No pallor.  Psychiatric: He has a normal mood and affect. His behavior is normal. Judgment and thought content normal.          Assessment & Plan:  Healthy male  History of ulcers colitis on Asacol 1600 mg 3 times daily asymptomatic except for trace guaiac however hemoglobin normal continue current medication followup in the summer with Dr. Arelia Frazier and less he notices a change in his bowel habits or any overt bleeding  Erectile dysfunction continue Viagra when necessary

## 2012-12-16 ENCOUNTER — Telehealth: Payer: Self-pay | Admitting: Internal Medicine

## 2012-12-16 NOTE — Telephone Encounter (Signed)
Patient would like an earlier appt for colitis flare.  He will come in and see Nicoletta Ba PA on 12/18/12 9:30

## 2012-12-16 NOTE — Telephone Encounter (Signed)
Left message for patient to call back  

## 2012-12-18 ENCOUNTER — Encounter: Payer: Self-pay | Admitting: Physician Assistant

## 2012-12-18 ENCOUNTER — Other Ambulatory Visit (INDEPENDENT_AMBULATORY_CARE_PROVIDER_SITE_OTHER): Payer: BC Managed Care – PPO

## 2012-12-18 ENCOUNTER — Ambulatory Visit (INDEPENDENT_AMBULATORY_CARE_PROVIDER_SITE_OTHER): Payer: BC Managed Care – PPO | Admitting: Physician Assistant

## 2012-12-18 VITALS — BP 128/80 | HR 79 | Ht 71.0 in | Wt 248.2 lb

## 2012-12-18 DIAGNOSIS — K5150 Left sided colitis without complications: Secondary | ICD-10-CM

## 2012-12-18 DIAGNOSIS — K625 Hemorrhage of anus and rectum: Secondary | ICD-10-CM

## 2012-12-18 LAB — CBC WITH DIFFERENTIAL/PLATELET
Basophils Relative: 0.5 % (ref 0.0–3.0)
Eosinophils Absolute: 0.2 10*3/uL (ref 0.0–0.7)
Eosinophils Relative: 3.8 % (ref 0.0–5.0)
HCT: 41.6 % (ref 39.0–52.0)
Lymphs Abs: 1.3 10*3/uL (ref 0.7–4.0)
MCHC: 33.9 g/dL (ref 30.0–36.0)
MCV: 94.1 fl (ref 78.0–100.0)
Monocytes Absolute: 0.5 10*3/uL (ref 0.1–1.0)
Platelets: 205 10*3/uL (ref 150.0–400.0)
RBC: 4.42 Mil/uL (ref 4.22–5.81)
WBC: 5.2 10*3/uL (ref 4.5–10.5)

## 2012-12-18 MED ORDER — PREDNISONE 20 MG PO TABS: ORAL_TABLET | ORAL | Status: DC

## 2012-12-18 NOTE — Progress Notes (Signed)
Subjective:    Patient ID: Angel Frazier, male    DOB: Feb 15, 1956, 57 y.o.   MRN: [id number]  HPI  Angel Frazier is a pleasant 57 year old African American male known to Dr. Carlean Purl who has history of left-sided ulcerative colitis which was initially diagnosed in 2004. He had full colonoscopy in 2005 and then had a flexible sigmoidoscopy most recently in April of 2012 at which time he had banding of internal hemorrhoids and was also noted to have mild active ulcerative colitis. He has been maintained on Asacol HD  2 tablets 3 times daily, and had been doing well over the past year. Patient comes in today stating that he has been having a flare over  the past 2-3 weeks and is now having 5-6 loose bowel movements per day area he denies any abdominal pain or cramping but says he has had some bloating. His appetite has been fine his weight has been stable he's not had any fever or chills. He has been seeing bright red blood in his bowel movements consistently. He has not been on any new meds antibiotics etc. He states that he has taken steroids before and generally does well    Review of Systems  Constitutional: Negative.   HENT: Negative.   Eyes: Negative.   Respiratory: Negative.   Cardiovascular: Negative.   Gastrointestinal: Positive for diarrhea, blood in stool and abdominal distention.  Endocrine: Negative.   Musculoskeletal: Negative.   Skin: Negative.   Allergic/Immunologic: Negative.   Neurological: Negative.   Hematological: Negative.   Psychiatric/Behavioral: Negative.    Outpatient Prescriptions Prior to Visit  Medication Sig Dispense Refill  . aspirin 81 MG tablet Take 81 mg by mouth daily.        . Mesalamine (ASACOL HD) 800 MG TBEC Take 2 tablets (1,600 mg total) by mouth 3 (three) times daily.  540 tablet  3  . Omega-3 Fatty Acids (FISH OIL) 300 MG CAPS Take 2 capsules by mouth daily.        . sildenafil (VIAGRA) 100 MG tablet Take 1 tablet (100 mg total) by mouth as needed.  6 tablet   11  . vitamin C (ASCORBIC ACID) 500 MG tablet Take 500 mg by mouth daily.         No facility-administered medications prior to visit.   No Known Allergies Patient Active Problem List   Diagnosis Date Noted  . ED (erectile dysfunction) 05/21/2012  . LEFT SIDED ULCERATIVE COLITIS 08/11/2008   History  Substance Use Topics  . Smoking status: Former Research scientist (life sciences)  . Smokeless tobacco: Never Used  . Alcohol Use: 0.5 oz/week    1 drink(s) per week   family history includes Diabetes in an unspecified family member and Hypertension in an unspecified family member.  There is no history of Colon cancer.     Objective:   Physical Exam  well-developed African American male in no acute distress, pleasant blood pressure 128/80 pulse 79 height 5 foot 11 weight 248. HEENT; nontraumatic normocephalic EOMI PERRLA sclera anicteric, Supple ;no JVD, Cardiovascular; regular rate and rhythm with S1-S2 no murmur or gallop, Pulmonary; clear bilaterally, Abdomen; soft he is mildly tender in the left lower quadrant there is no guarding or rebound no palpable mass or hepatosplenomegaly bowel sounds are active, Rectal; exam not done, Extremities; no clubbing cyanosis or edema skin warm and dry, Psych; mood and affect normal and appropriate.        Assessment & Plan:  #1 57 yo  old male with  exacerbation of known left-sided ulcerative colitis presenting with diarrhea and hematochezia x2-3 weeks  Plan; will check CBC with differential and CRP today Continue Asacol HD 800 mg 2 tablets 3 times daily Add prednisone 20 mg by mouth every morning Patient is asked to call in a week if he has not noticed any improvement and/or if the symptoms worsen in the interim, otherwise he will have followup with Dr. Carlean Purl in about 3 weeks and we'll keep him on 20 mg of prednisone daily until that time.

## 2012-12-18 NOTE — Patient Instructions (Addendum)
Stay on the Asacol HD, 800 mg , 3 tab twice daily.  We sent a prescription to [address] , phone number is [phone]. It is for Prednisone 20 mg.  Take as directed.  We rescheduled the appointment with Dr. Carlean Purl on 01-23-2013 Friday at 8:45 am.

## 2012-12-22 NOTE — Progress Notes (Signed)
Agree with Ms. Esterwood's assessment and plan. Mathews Stuhr E. Ronni Osterberg, MD, FACG   

## 2013-01-18 ENCOUNTER — Emergency Department (HOSPITAL_COMMUNITY): Payer: BC Managed Care – PPO

## 2013-01-18 ENCOUNTER — Encounter (HOSPITAL_COMMUNITY): Payer: Self-pay | Admitting: Emergency Medicine

## 2013-01-18 ENCOUNTER — Emergency Department (HOSPITAL_COMMUNITY)
Admission: EM | Admit: 2013-01-18 | Discharge: 2013-01-18 | Disposition: A | Discharge status: Home / Self Care | Payer: BC Managed Care – PPO | Attending: Emergency Medicine | Admitting: Emergency Medicine

## 2013-01-18 DIAGNOSIS — R519 Headache, unspecified: Secondary | ICD-10-CM

## 2013-01-18 DIAGNOSIS — Z7982 Long term (current) use of aspirin: Secondary | ICD-10-CM | POA: Insufficient documentation

## 2013-01-18 DIAGNOSIS — R51 Headache: Secondary | ICD-10-CM | POA: Insufficient documentation

## 2013-01-18 DIAGNOSIS — H53149 Visual discomfort, unspecified: Secondary | ICD-10-CM | POA: Insufficient documentation

## 2013-01-18 DIAGNOSIS — Z8719 Personal history of other diseases of the digestive system: Secondary | ICD-10-CM | POA: Insufficient documentation

## 2013-01-18 DIAGNOSIS — I10 Essential (primary) hypertension: Secondary | ICD-10-CM | POA: Insufficient documentation

## 2013-01-18 DIAGNOSIS — Z8679 Personal history of other diseases of the circulatory system: Secondary | ICD-10-CM | POA: Insufficient documentation

## 2013-01-18 DIAGNOSIS — Z87891 Personal history of nicotine dependence: Secondary | ICD-10-CM | POA: Insufficient documentation

## 2013-01-18 DIAGNOSIS — N529 Male erectile dysfunction, unspecified: Secondary | ICD-10-CM | POA: Insufficient documentation

## 2013-01-18 MED ORDER — BUTALBITAL-APAP-CAFFEINE 50-325-40 MG PO TABS: 1.0000 | ORAL_TABLET | Freq: Four times a day (QID) | ORAL | Status: DC | PRN

## 2013-01-18 MED ORDER — DIPHENHYDRAMINE HCL 50 MG/ML IJ SOLN
25.0000 mg | Freq: Once | INTRAMUSCULAR | Status: AC
  Administered 2013-01-18: 25 mg via INTRAVENOUS
  Filled 2013-01-18: qty 1

## 2013-01-18 MED ORDER — MORPHINE SULFATE 4 MG/ML IJ SOLN
4.0000 mg | Freq: Once | INTRAMUSCULAR | Status: AC
  Administered 2013-01-18: 4 mg via INTRAVENOUS
  Filled 2013-01-18: qty 1

## 2013-01-18 MED ORDER — SODIUM CHLORIDE 0.9 % IV BOLUS (SEPSIS)
1000.0000 mL | Freq: Once | INTRAVENOUS | Status: AC
  Administered 2013-01-18: 1000 mL via INTRAVENOUS

## 2013-01-18 MED ORDER — KETOROLAC TROMETHAMINE 30 MG/ML IJ SOLN
30.0000 mg | Freq: Once | INTRAMUSCULAR | Status: AC
  Administered 2013-01-18: 30 mg via INTRAVENOUS
  Filled 2013-01-18: qty 1

## 2013-01-18 MED ORDER — METOCLOPRAMIDE HCL 5 MG/ML IJ SOLN
10.0000 mg | Freq: Once | INTRAMUSCULAR | Status: AC
  Administered 2013-01-18: 10 mg via INTRAVENOUS
  Filled 2013-01-18: qty 2

## 2013-01-18 NOTE — ED Notes (Addendum)
PT. ARRIVED WITH EMS FROM HOME REPORTS INTERMITTENT HEADACHE / PHOTOPHOBIA SINCE Friday , DENIES INJURY , NO NAUSEA OR BLURRED VISION , UNRELIEVED BY OTC TYLENOL , ALERT AND ORIENTED , SPEECH CLEAR / NO FACIAL ASYMMETRY , AMBULATORY . CBG= 142 BY EMS.

## 2013-01-18 NOTE — ED Provider Notes (Signed)
TIME SEEN: 7:17 AM  CHIEF COMPLAINT: Headache  HPI: Patient is a 57 y.o. AAM with history of ulcerative colitis on mesalamine with recent flareup currently on prednisone who presents the emergency department with 2 days of headache. He reports the headache is diffuse, throbbing, sharp, gradual onset. He states he has not had a headache like this before. He has taken Tylenol once at home with only minimal relief. No history of head injury. No thunderclap headache. Not on anticoagulation. No fever, neck pain or neck stiffness, recent tick bite. No numbness, tingling, focal weakness, vision or hearing changes. No tearing of the eyes or rhinorrhea. Patient has photophobia but no nausea or vomiting.  ROS: See HPI Constitutional: no fever  Eyes: no drainage  ENT: no runny nose   Cardiovascular:  no chest pain  Resp: no SOB  GI: no vomiting GU: no dysuria Integumentary: no rash  Allergy: no hives  Musculoskeletal: no leg swelling  Neurological: no slurred speech ROS otherwise negative  PAST MEDICAL HISTORY/PAST SURGICAL HISTORY:  Past Medical History  Diagnosis Date  . Erectile dysfunction   . Left sided ulcerative colitis   . Hypertension   . Bleeding hemorrhoids   . Anabolic steroid abuse     MEDICATIONS:  Prior to Admission medications   Medication Sig Start Date End Date Taking? Authorizing Provider  aspirin 81 MG tablet Take 81 mg by mouth daily.     Yes Historical Provider, MD  Mesalamine (ASACOL HD) 800 MG TBEC Take 2 tablets (1,600 mg total) by mouth 3 (three) times daily. 05/21/12  Yes Dorena Cookey, MD  Omega-3 Fatty Acids (FISH OIL) 300 MG CAPS Take 2 capsules by mouth daily.     Yes Historical Provider, MD  predniSONE (DELTASONE) 20 MG tablet Take 20 mg by mouth daily. 12/18/12  Yes Amy S Esterwood, PA-C  vitamin B-12 (CYANOCOBALAMIN) 1000 MCG tablet Take 1,000 mcg by mouth daily.   Yes Historical Provider, MD  sildenafil (VIAGRA) 100 MG tablet Take 100 mg by mouth as  needed for erectile dysfunction. 05/21/12   Dorena Cookey, MD    ALLERGIES:  No Known Allergies  SOCIAL HISTORY:  History  Substance Use Topics  . Smoking status: Former Research scientist (life sciences)  . Smokeless tobacco: Never Used  . Alcohol Use: 0.5 oz/week    1 drink(s) per week    FAMILY HISTORY: Family History  Problem Relation Age of Onset  . Diabetes      cousin  . Hypertension    . Colon cancer Neg Hx     EXAM: BP 140/81  Pulse 93  Temp(Src) 97.8 F (36.6 C) (Oral)  Resp 23  SpO2 93% CONSTITUTIONAL: Alert and oriented and responds appropriately to questions. Well-appearing; well-nourished, no apparent distress HEAD: Normocephalic, atraumatic EYES: Conjunctivae clear, PERRL, extraocular movements intact ENT: normal nose; no rhinorrhea; moist mucous membranes; pharynx without lesions noted NECK: Supple, no meningismus, no LAD  CARD: RRR; S1 and S2 appreciated; no murmurs, no clicks, no rubs, no gallops RESP: Normal chest excursion without splinting or tachypnea; breath sounds clear and equal bilaterally; no wheezes, no rhonchi, no rales,  ABD/GI: Normal bowel sounds; non-distended; soft, non-tender, no rebound, no guarding BACK:  The back appears normal and is non-tender to palpation, there is no CVA tenderness EXT: Normal ROM in all joints; non-tender to palpation; no edema; normal capillary refill; no cyanosis    SKIN: Normal color for age and race; warm NEURO: Moves all extremities equally strength 5/5 in all 4  extremities, sensation to light touch intact diffusely, cranial nerves II through XII intact PSYCH: The patient's mood and manner are appropriate. Grooming and personal hygiene are appropriate.  MEDICAL DECISION MAKING: Headache seems consistent with migraine headache however given patient denies a prior history of chronic headaches, will obtain head imaging.  Low suspicion for ICH or meningitis.  Will give Reglan, IVF, reassess.  ED PROGRESS: Pt reports feeling much better  but his headache is not completely gone. Will give Toradol. Head CT is negative.  10:49 AM  Patient reports no relief with Toradol and is asking for "the good stuff". Will give a dose of morphine and reassess.  11:29 AM  HA is completely gone.  He is ready for discharge home. He does have intermittent episodes of mild tachycardia and hypoxia. He was a smoker in the past. Likely due to obstructive changes. His lungs are clear. Denies any chest pain or shortness of breath. No fever or cough.  [place], DO 01/18/13 1129

## 2013-01-19 ENCOUNTER — Telehealth: Payer: Self-pay | Admitting: Internal Medicine

## 2013-01-19 NOTE — Telephone Encounter (Signed)
Patient advised to taper dose, use Tylenol for HA pain.  Keep the appt for 01/23/13

## 2013-01-19 NOTE — Telephone Encounter (Signed)
Patient has had migraine HA and was treated in the ER yesterday for it.  He is still on Prednisone 20 mg, his colitis symptoms have improved and he is not having any more symptoms.  He has follow up with you on 01/23/13.  Can he wean down on prednisone?

## 2013-01-19 NOTE — Telephone Encounter (Signed)
10 mg rednisone daily x 5 days, 5 mg daily x 5 days stop

## 2013-01-20 ENCOUNTER — Ambulatory Visit: Payer: BC Managed Care – PPO | Admitting: Internal Medicine

## 2013-01-23 ENCOUNTER — Encounter: Payer: Self-pay | Admitting: Internal Medicine

## 2013-01-23 ENCOUNTER — Ambulatory Visit (INDEPENDENT_AMBULATORY_CARE_PROVIDER_SITE_OTHER): Payer: BC Managed Care – PPO | Admitting: Internal Medicine

## 2013-01-23 ENCOUNTER — Other Ambulatory Visit: Payer: BC Managed Care – PPO

## 2013-01-23 VITALS — BP 112/72 | HR 72 | Ht 71.0 in | Wt 244.4 lb

## 2013-01-23 DIAGNOSIS — K5150 Left sided colitis without complications: Secondary | ICD-10-CM

## 2013-01-23 MED ORDER — MESALAMINE 1000 MG RE SUPP: 1000.0000 mg | Freq: Every day | RECTAL | Status: DC

## 2013-01-23 MED ORDER — PREDNISONE 20 MG PO TABS: ORAL_TABLET | ORAL | Status: AC

## 2013-01-23 NOTE — Patient Instructions (Addendum)
Your physician has requested that you go to the basement for the following lab work before leaving today: Vitamin D level  We have sent the following medications to your pharmacy for you to pick up at your convenience: Angel Frazier, also we are providing you with a coupon card for this  Follow up with [place] in November 2014.  I appreciate the opportunity to care for you.

## 2013-01-23 NOTE — Progress Notes (Signed)
  Subjective:    Patient ID: Angel Frazier, male    DOB: 1956/01/23, 57 y.o.   MRN: [id number]  HPI Angel Frazier is here after recent flare of bloody diarrhe and was seen and treated by Nicoletta Ba PA-C. The prednisone calmed things down and he is without diarrhea but does have slight red blood on the toilet paper at times now. While on the prednisone he developed a severe headache and had ED evaluation with negative head CT. He called and we began further taper of prednisone.  Medications, allergies, past medical history, past surgical history, family history and social history are reviewed and updated in the EMR. Review of Systems As above    Objective:   Physical Exam Muscular WDWN NAD Abdomen soft and non-tender Rectal inspection RA tag otherwise ok    Assessment & Plan:  Left sided ulcerative (chronic) colitis - Plan: mesalamine (CANASA) 1000 MG suppository, Vitamin D (25 hydroxy)

## 2013-01-23 NOTE — Assessment & Plan Note (Signed)
Improved flare with slight rectal bleeding only. He has had hemorrhoids ligated in past - could be recurrent hemorrhoids. Will complete prednisone taper and add Canasa 1000 mg suppository qhs x 1 month. See me in 3 months, sooner prn. If persistent issues endoscopic evaluation and possible biopsies, repeat hemorrhoid ligation.   Vit D level today Pneumovax next visit - when off prednisone

## 2013-01-24 ENCOUNTER — Encounter: Payer: Self-pay | Admitting: Internal Medicine

## 2013-01-24 DIAGNOSIS — E559 Vitamin D deficiency, unspecified: Secondary | ICD-10-CM

## 2013-01-24 HISTORY — DX: Vitamin D deficiency, unspecified: E55.9

## 2013-01-24 LAB — VITAMIN D 25 HYDROXY (VIT D DEFICIENCY, FRACTURES): Vit D, 25-Hydroxy: 25 ng/mL — ABNORMAL LOW (ref 30–89)

## 2013-01-24 NOTE — Progress Notes (Signed)
Quick Note:  Vit D is lowish Rec: 50000 iu vit d3 weekly x 6 weeks disp # 6'  Will recheck level when he returns  He should also start Ca 1200 mg and vit 1000 IU daily ______

## 2013-01-26 ENCOUNTER — Other Ambulatory Visit: Payer: Self-pay

## 2013-01-26 MED ORDER — VITAMIN D3 1.25 MG (50000 UT) PO CAPS: 1.0000 | ORAL_CAPSULE | ORAL | Status: DC

## 2013-04-27 ENCOUNTER — Ambulatory Visit (INDEPENDENT_AMBULATORY_CARE_PROVIDER_SITE_OTHER): Payer: BC Managed Care – PPO | Admitting: Internal Medicine

## 2013-04-27 ENCOUNTER — Encounter: Payer: Self-pay | Admitting: Internal Medicine

## 2013-04-27 ENCOUNTER — Other Ambulatory Visit: Payer: BC Managed Care – PPO

## 2013-04-27 VITALS — BP 120/90 | HR 68 | Ht 71.0 in | Wt 252.8 lb

## 2013-04-27 DIAGNOSIS — E559 Vitamin D deficiency, unspecified: Secondary | ICD-10-CM

## 2013-04-27 DIAGNOSIS — K5150 Left sided colitis without complications: Secondary | ICD-10-CM

## 2013-04-27 DIAGNOSIS — Z1211 Encounter for screening for malignant neoplasm of colon: Secondary | ICD-10-CM

## 2013-04-27 NOTE — Assessment & Plan Note (Signed)
Recheck level

## 2013-04-27 NOTE — Patient Instructions (Addendum)
Your physician has requested that you go to the basement for the following lab work before leaving today: Vitamin D level   Please call back in January 2015 and set up a pre-visit and colonoscopy appointment.  Dx : screening and U.C.   It is the time of year to have a vaccination to prevent the flu (influenza virus).  Please have this done through your primary care provider or you can get this done at local pharmacies or the [facility]. It would be very helpful if you notify your primary care provider when and where you had the vaccination given by messaging them in My Chart, leaving a message or faxing the information.   I appreciate the opportunity to care for you.

## 2013-04-27 NOTE — Assessment & Plan Note (Signed)
Much better Off Canasa Still has rare to occasional rectal bleeding that he thinks are hemorrhoids

## 2013-04-27 NOTE — Progress Notes (Signed)
  Subjective:    Patient ID: Angel Frazier, male    DOB: 07-22-1955, 57 y.o.   MRN: [id number]  HPI The patient is here for followup of his ulcerative colitis and rectal bleeding. He is not having any diarrhea. There is rare to occasional slight rectal bleeding on the toilet paper if he gets a hard stool which is rare that will happen. He stopped using the Canasa suppositories. He doesn't think that made a difference. He had a flare earlier in the year and had a brief course of prednisone and is on mesalamine 4.8 g daily.  Medications, allergies, past medical history, past surgical history, family history and social history are reviewed and updated in the EMR.  Review of Systems As per history of present illness    Objective:   Physical Exam Well-developed well-nourished no acute distress    Assessment & Plan:   1. LEFT SIDED ULCERATIVE COLITIS   2. Special screening for malignant neoplasms, colon   3. Unspecified vitamin D deficiency

## 2013-04-28 LAB — VITAMIN D 25 HYDROXY (VIT D DEFICIENCY, FRACTURES): Vit D, 25-Hydroxy: 35 ng/mL (ref 30–89)

## 2013-05-04 NOTE — Progress Notes (Signed)
Quick Note:  Vit D level ok Please take 1000 IU vitamin D3 daily to maintain ______

## 2013-05-20 ENCOUNTER — Other Ambulatory Visit (INDEPENDENT_AMBULATORY_CARE_PROVIDER_SITE_OTHER): Payer: BC Managed Care – PPO

## 2013-05-20 DIAGNOSIS — Z0000 Encounter for general adult medical examination without abnormal findings: Secondary | ICD-10-CM

## 2013-05-20 LAB — CBC WITH DIFFERENTIAL/PLATELET
Basophils Absolute: 0 10*3/uL (ref 0.0–0.1)
Basophils Relative: 0.3 % (ref 0.0–3.0)
Eosinophils Absolute: 0.3 10*3/uL (ref 0.0–0.7)
Lymphocytes Relative: 34.2 % (ref 12.0–46.0)
MCHC: 33.9 g/dL (ref 30.0–36.0)
Neutrophils Relative %: 50.5 % (ref 43.0–77.0)
Platelets: 217 10*3/uL (ref 150.0–400.0)
RBC: 4.31 Mil/uL (ref 4.22–5.81)

## 2013-05-20 LAB — POCT URINALYSIS DIPSTICK
Bilirubin, UA: NEGATIVE
Blood, UA: NEGATIVE
Ketones, UA: NEGATIVE
Spec Grav, UA: 1.02
pH, UA: 5.5

## 2013-05-20 LAB — HEPATIC FUNCTION PANEL
ALT: 24 U/L (ref 0–53)
AST: 30 U/L (ref 0–37)
Alkaline Phosphatase: 35 U/L — ABNORMAL LOW (ref 39–117)
Bilirubin, Direct: 0.1 mg/dL (ref 0.0–0.3)
Total Bilirubin: 0.8 mg/dL (ref 0.3–1.2)

## 2013-05-20 LAB — LIPID PANEL
Cholesterol: 143 mg/dL (ref 0–200)
HDL: 36.3 mg/dL — ABNORMAL LOW (ref >39.00)
Total CHOL/HDL Ratio: 4
Triglycerides: 39 mg/dL (ref 0.0–149.0)

## 2013-05-20 LAB — BASIC METABOLIC PANEL
CO2: 29 mEq/L (ref 19–32)
Calcium: 9.3 mg/dL (ref 8.4–10.5)
Creatinine, Ser: 1 mg/dL (ref 0.4–1.5)

## 2013-05-25 ENCOUNTER — Encounter: Payer: BC Managed Care – PPO | Admitting: Family Medicine

## 2013-06-01 ENCOUNTER — Encounter: Payer: Self-pay | Admitting: Internal Medicine

## 2013-06-03 ENCOUNTER — Ambulatory Visit (INDEPENDENT_AMBULATORY_CARE_PROVIDER_SITE_OTHER): Payer: BC Managed Care – PPO | Admitting: Family Medicine

## 2013-06-03 ENCOUNTER — Encounter: Payer: Self-pay | Admitting: Family Medicine

## 2013-06-03 VITALS — BP 140/90 | Temp 98.6°F | Ht 70.0 in | Wt 261.0 lb

## 2013-06-03 DIAGNOSIS — N529 Male erectile dysfunction, unspecified: Secondary | ICD-10-CM

## 2013-06-03 DIAGNOSIS — Z1211 Encounter for screening for malignant neoplasm of colon: Secondary | ICD-10-CM

## 2013-06-03 DIAGNOSIS — I10 Essential (primary) hypertension: Secondary | ICD-10-CM

## 2013-06-03 DIAGNOSIS — Z23 Encounter for immunization: Secondary | ICD-10-CM

## 2013-06-03 DIAGNOSIS — K5150 Left sided colitis without complications: Secondary | ICD-10-CM

## 2013-06-03 DIAGNOSIS — E559 Vitamin D deficiency, unspecified: Secondary | ICD-10-CM

## 2013-06-03 MED ORDER — MESALAMINE 800 MG PO TBEC: 2.0000 | DELAYED_RELEASE_TABLET | Freq: Three times a day (TID) | ORAL | Status: DC

## 2013-06-03 MED ORDER — VARDENAFIL HCL 20 MG PO TABS: 20.0000 mg | ORAL_TABLET | Freq: Every day | ORAL | Status: DC | PRN

## 2013-06-03 NOTE — Patient Instructions (Signed)
Continue your current medications  Remember to take a vitamin D supplement daily  Check your blood pressure daily when you get out of bed ,,,,,,,,,, keep a record of all your blood pressure readings,,,,,,,,, return in 6 weeks with a record of all your blood pressure readings and the device  Levitra 20 mg,,,,, when necessary,,,,,,,,, [email or web address]

## 2013-06-03 NOTE — Progress Notes (Signed)
   Subjective:    Patient ID: Angel Frazier, male    DOB: 12-31-1955, 57 y.o.   MRN: [id number]  HPI Angel Frazier is a 57 year old male nonsmoker who comes in today for general physical examination  He's got a history of left-sided ulcerative colitis and is on Asacol he takes 2 tabs 3 times daily. He's due to see Dr. Mady Gemma in March for followup colonoscopy  He uses Viagra 100 mg when necessary but doesn't seem to be working he would like to discuss options. He takes a lot of over-the-counter vitamins and roots and herbs and an aspirin tablet. He says he is no longer taking the testosterone supplements  He gets routine eye care, dental care, vaccinations up-to-date   Review of Systems  Constitutional: Negative.   HENT: Negative.   Eyes: Negative.   Respiratory: Negative.   Cardiovascular: Negative.   Gastrointestinal: Negative.   Endocrine: Negative.   Genitourinary: Negative.   Musculoskeletal: Negative.   Skin: Negative.   Allergic/Immunologic: Negative.   Neurological: Negative.   Hematological: Negative.   Psychiatric/Behavioral: Negative.        Objective:   Physical Exam  Nursing note and vitals reviewed. Constitutional: He is oriented to person, place, and time. He appears well-developed and well-nourished.  HENT:  Head: Normocephalic and atraumatic.  Right Ear: External ear normal.  Left Ear: External ear normal.  Nose: Nose normal.  Mouth/Throat: Oropharynx is clear and moist.  Eyes: Conjunctivae and EOM are normal. Pupils are equal, round, and reactive to light.  Neck: Normal range of motion. Neck supple. No JVD present. No tracheal deviation present. No thyromegaly present.  Cardiovascular: Normal rate, regular rhythm, normal heart sounds and intact distal pulses.  Exam reveals no gallop and no friction rub.   No murmur heard. No carotid or bruits peripheral pulses 2+ and symmetrical  Pulmonary/Chest: Effort normal and breath sounds normal. No stridor. No respiratory  distress. He has no wheezes. He has no rales. He exhibits no tenderness.  Abdominal: Soft. Bowel sounds are normal. He exhibits no distension and no mass. There is no tenderness. There is no rebound and no guarding.  Genitourinary: Rectum normal, prostate normal and penis normal. Guaiac negative stool. No penile tenderness.  Musculoskeletal: Normal range of motion. He exhibits no edema and no tenderness.  Lymphadenopathy:    He has no cervical adenopathy.  Neurological: He is alert and oriented to person, place, and time. He has normal reflexes. No cranial nerve deficit. He exhibits normal muscle tone.  Skin: Skin is warm and dry. No rash noted. No erythema. No pallor.  Psychiatric: He has a normal mood and affect. His behavior is normal. Judgment and thought content normal.          Assessment & Plan:  Healthy male  History of left-sided colitis continue Asacol followup in GI by Dr. Mady Gemma  Question hypertension BP check every morning followup in 6 weeks  History of vitamin D deficiency check vitamin D level  Erectile dysfunction switch to Levitra

## 2013-06-03 NOTE — Progress Notes (Signed)
Pre visit review using our clinic review tool, if applicable. No additional management support is needed unless otherwise documented below in the visit note. 

## 2013-07-16 ENCOUNTER — Encounter: Payer: Self-pay | Admitting: Family Medicine

## 2013-07-16 ENCOUNTER — Ambulatory Visit (INDEPENDENT_AMBULATORY_CARE_PROVIDER_SITE_OTHER): Payer: BC Managed Care – PPO | Admitting: Family Medicine

## 2013-07-16 VITALS — BP 140/80 | Temp 98.6°F | Wt 252.0 lb

## 2013-07-16 DIAGNOSIS — N529 Male erectile dysfunction, unspecified: Secondary | ICD-10-CM

## 2013-07-16 DIAGNOSIS — I10 Essential (primary) hypertension: Secondary | ICD-10-CM

## 2013-07-16 MED ORDER — VARDENAFIL HCL 20 MG PO TABS: 20.0000 mg | ORAL_TABLET | Freq: Every day | ORAL | Status: DC | PRN

## 2013-07-16 NOTE — Progress Notes (Signed)
Pre visit review using our clinic review tool, if applicable. No additional management support is needed unless otherwise documented below in the visit note. 

## 2013-07-16 NOTE — Progress Notes (Signed)
   Subjective:    Patient ID: Angel Frazier, male    DOB: [birth year], 58 y.o.   MRN: [id number]  HPI Angel Frazier is a 58 year old male who comes in today for evaluation of possible hypertension  We saw about a month ago for general physical exam his blood pressure was elevated. He's been checking his blood pressure twice daily and comes back for followup. All his blood pressures at home are normal. This morning he got 120/80. BP here today 140/80   Review of Systems Review of systems negative    Objective:   Physical Exam Well-developed well nourished male no acute distress vital signs stable is afebrile BP here today 140/80       Assessment & Plan:  Normal blood pressure because of family history check BP weekly return when necessary if elevated

## 2013-07-16 NOTE — Patient Instructions (Signed)
Check your blood pressure once a week on Sunday morning  If your blood pressure is elevated then check it daily for 2 weeks. If at the end of 2 weeks your blood pressures normal then go back and just check it once a week if however it after 2 weeks of data gathering your blood pressures are all elevated then come in and see me for followup and reevaluation

## 2013-07-17 ENCOUNTER — Telehealth: Payer: Self-pay | Admitting: Family Medicine

## 2013-07-17 NOTE — Telephone Encounter (Signed)
Relevant patient education assigned to patient using Emmi. ° °

## 2013-07-23 ENCOUNTER — Ambulatory Visit (AMBULATORY_SURGERY_CENTER): Payer: Self-pay

## 2013-07-23 VITALS — Ht 71.0 in | Wt 252.2 lb

## 2013-07-23 DIAGNOSIS — K5190 Ulcerative colitis, unspecified, without complications: Secondary | ICD-10-CM

## 2013-07-23 MED ORDER — NA SULFATE-K SULFATE-MG SULF 17.5-3.13-1.6 GM/177ML PO SOLN: ORAL | Status: DC

## 2013-07-24 ENCOUNTER — Encounter: Payer: Self-pay | Admitting: Internal Medicine

## 2013-08-04 ENCOUNTER — Encounter: Payer: Self-pay | Admitting: Family Medicine

## 2013-08-06 ENCOUNTER — Ambulatory Visit (AMBULATORY_SURGERY_CENTER): Payer: BC Managed Care – PPO | Admitting: Internal Medicine

## 2013-08-06 ENCOUNTER — Encounter: Payer: Self-pay | Admitting: Internal Medicine

## 2013-08-06 VITALS — BP 119/79 | HR 57 | Temp 98.8°F | Resp 24 | Ht 71.0 in | Wt 252.0 lb

## 2013-08-06 DIAGNOSIS — K5150 Left sided colitis without complications: Secondary | ICD-10-CM

## 2013-08-06 DIAGNOSIS — K5190 Ulcerative colitis, unspecified, without complications: Secondary | ICD-10-CM

## 2013-08-06 DIAGNOSIS — K5289 Other specified noninfective gastroenteritis and colitis: Secondary | ICD-10-CM

## 2013-08-06 DIAGNOSIS — Z1211 Encounter for screening for malignant neoplasm of colon: Secondary | ICD-10-CM

## 2013-08-06 MED ORDER — SODIUM CHLORIDE 0.9 % IV SOLN: 500.0000 mL | INTRAVENOUS | Status: DC

## 2013-08-06 NOTE — Progress Notes (Signed)
A/ox3 pleased with MAC, report to Suzanne RN 

## 2013-08-06 NOTE — Progress Notes (Signed)
Called to room to assist during endoscopic procedure.  Patient ID and intended procedure confirmed with present staff. Received instructions for my participation in the procedure from the performing physician.  

## 2013-08-06 NOTE — Op Note (Signed)
[facility]  [address] [place], [address]   COLONOSCOPY PROCEDURE REPORT  PATIENT: Angel Frazier, Angel Frazier  MR#: [id number] BIRTHDATE: 12-Aug-1955 , 52  yrs. old GENDER: Male ENDOSCOPIST: Gatha Mayer, MD, [facility] PROCEDURE DATE:  08/06/2013 PROCEDURE:   Colonoscopy with biopsy First Screening Colonoscopy - Avg.  risk and is 50 yrs.  old or older - No.  Prior Negative Screening - Now for repeat screening. N/A  History of Adenoma - Now for follow-up colonoscopy & has been > or = to 3 yrs.  N/A  Polyps Removed Today? No.  Recommend repeat exam, <10 yrs? Yes.  High risk (family or personal hx). ASA CLASS:   Class II INDICATIONS:High risk patient with previously diagnosed UC left-sided colitis. MEDICATIONS: propofol (Diprivan) 257m IV, MAC sedation, administered by CRNA, and These medications were titrated to patient response per physician's verbal order  DESCRIPTION OF PROCEDURE:   After the risks benefits and alternatives of the procedure were thoroughly explained, informed consent was obtained.  A digital rectal exam revealed no abnormalities of the rectum, A digital rectal exam revealed no prostatic nodules, and A digital rectal exam revealed the prostate was not enlarged.   The LB [id number] endoscope was introduced through the anus and advanced to the terminal ileum which was intubated for a short distance. No adverse events experienced.   The quality of the prep was excellent using Suprep The instrument was then slowly withdrawn as the colon was fully examined.  COLON FINDINGS: Small patches of abnormal mucosa were found in the sigmoid colon.  The mucosa was erythematous and had loss of vascularity and erosions.  This was likely consistent with known ulcerative colitis disease.  Multiple biopsies were performed using cold forceps.   The colon mucosa was otherwise normal. random biopsies taken throughout.  The mucosa appeared normal in the terminal ileum.   Retroflexed views revealed  scars from prior hemorrhoid  banding no other abnormalities. The time to cecum=1 minutes 49 seconds.  Withdrawal time=11 minutes 44 seconds.  The scope was withdrawn and the procedure completed. COMPLICATIONS: There were no complications.  ENDOSCOPIC IMPRESSION: 1.   Small patches of abnormal mucosa were found in the sigmoid colon; multiple biopsies were performed using cold forceps 2.   The colon mucosa was otherwise normal - excellent prep 3.   Normal mucosa in the terminal ileum 4.   Overall very slight sigmoid visible inflammation - 10-11 years from diagnosis of left UC (mainly a CRCA screening exam)  RECOMMENDATIONS: Timing of repeat colonoscopy will be determined by pathology findings in patient w/ 10-11 year history of left UC   eSigned:  CGatha Mayer MD, FD[facility]03/10/2013 9:21 AM   cc: The Patient

## 2013-08-06 NOTE — Patient Instructions (Addendum)
Slight inflammation seen in the left (end) of colon. Overall things looked good. i do not think hemorrhoids are a problem now.  I will let you know pathology results and when to have another routine colonoscopy by mail.  I appreciate the opportunity to care for you. Gatha Mayer, MD, FACG YOU HAD AN ENDOSCOPIC PROCEDURE TODAY AT [facility]: Refer to the procedure report that was given to you for any specific questions about what was found during the examination.  If the procedure report does not answer your questions, please call your gastroenterologist to clarify.  If you requested that your care partner not be given the details of your procedure findings, then the procedure report has been included in a sealed envelope for you to review at your convenience later.  YOU SHOULD EXPECT: Some feelings of bloating in the abdomen. Passage of more gas than usual.  Walking can help get rid of the air that was put into your GI tract during the procedure and reduce the bloating. If you had a lower endoscopy (such as a colonoscopy or flexible sigmoidoscopy) you may notice spotting of blood in your stool or on the toilet paper. If you underwent a bowel prep for your procedure, then you may not have a normal bowel movement for a few days.  DIET: Your first meal following the procedure should be a light meal and then it is ok to progress to your normal diet.  A half-sandwich or bowl of soup is an example of a good first meal.  Heavy or fried foods are harder to digest and may make you feel nauseous or bloated.  Likewise meals heavy in dairy and vegetables can cause extra gas to form and this can also increase the bloating.  Drink plenty of fluids but you should avoid alcoholic beverages for 24 hours.  ACTIVITY: Your care partner should take you home directly after the procedure.  You should plan to take it easy, moving slowly for the rest of the day.  You can resume normal activity the day after  the procedure however you should NOT DRIVE or use heavy machinery for 24 hours (because of the sedation medicines used during the test).    SYMPTOMS TO REPORT IMMEDIATELY: A gastroenterologist can be reached at any hour.  During normal business hours, 8:30 AM to 5:00 PM Monday through Friday, call [phone].  After hours and on weekends, please call the GI answering service at [phone] who will take a message and have the physician on call contact you.   Following lower endoscopy (colonoscopy or flexible sigmoidoscopy):  Excessive amounts of blood in the stool  Significant tenderness or worsening of abdominal pains  Swelling of the abdomen that is new, acute  Fever of 100F or higher  FOLLOW UP: If any biopsies were taken you will be contacted by phone or by letter within the next 1-3 weeks.  Call your gastroenterologist if you have not heard about the biopsies in 3 weeks.  Our staff will call the home number listed on your records the next business day following your procedure to check on you and address any questions or concerns that you may have at that time regarding the information given to you following your procedure. This is a courtesy call and so if there is no answer at the home number and we have not heard from you through the emergency physician on call, we will assume that you have returned to your regular daily  activities without incident.  SIGNATURES/CONFIDENTIALITY: You and/or your care partner have signed paperwork which will be entered into your electronic medical record.  These signatures attest to the fact that that the information above on your After Visit Summary has been reviewed and is understood.  Full responsibility of the confidentiality of this discharge information lies with you and/or your care-partner.

## 2013-08-07 ENCOUNTER — Telehealth: Payer: Self-pay | Admitting: *Deleted

## 2013-08-07 NOTE — Telephone Encounter (Signed)
No answer, left  Message to call if questions or concerns.

## 2013-08-14 ENCOUNTER — Encounter: Payer: Self-pay | Admitting: Internal Medicine

## 2013-09-08 ENCOUNTER — Telehealth: Payer: Self-pay | Admitting: Internal Medicine

## 2013-09-08 NOTE — Telephone Encounter (Signed)
I have reviewed biopsy results with the patient.  All questions answered.  I have mailed him another copy of the biopsy letter

## 2013-09-24 ENCOUNTER — Encounter: Payer: Self-pay | Admitting: Family Medicine

## 2013-09-24 ENCOUNTER — Ambulatory Visit (INDEPENDENT_AMBULATORY_CARE_PROVIDER_SITE_OTHER): Payer: BC Managed Care – PPO | Admitting: Family Medicine

## 2013-09-24 VITALS — BP 110/88 | Temp 97.9°F | Wt 242.0 lb

## 2013-09-24 DIAGNOSIS — N529 Male erectile dysfunction, unspecified: Secondary | ICD-10-CM

## 2013-09-24 NOTE — Patient Instructions (Signed)
Call the urology Center and make an appointment to see Dr. Alinda Money

## 2013-09-24 NOTE — Progress Notes (Signed)
   Subjective:    Patient ID: Angel Frazier, male    DOB: 1955/10/21, 58 y.o.   MRN: [id number]  HPI Angel Frazier is a 58 year old male who comes in today for evaluation of erectile dysfunction  He's tried different products. Most recently tried to Levitra 20 mg,,,,,,,,,,, he then took 2 and it did not help  Since sees struggle with this I would recommend he now see a urologist Dr. Hart Rochester in   Review of Systems    negative Objective:   Physical Exam Well-developed well-nourished male no acute distress vital signs stable he's afebrile BP 110/80       Assessment & Plan:  Erectile dysfunction refer to urology Dr. Alinda Money

## 2013-10-21 ENCOUNTER — Telehealth: Payer: Self-pay | Admitting: Internal Medicine

## 2013-10-21 DIAGNOSIS — K5150 Left sided colitis without complications: Secondary | ICD-10-CM

## 2013-10-21 MED ORDER — MESALAMINE 800 MG PO TBEC: 2.0000 | DELAYED_RELEASE_TABLET | Freq: Three times a day (TID) | ORAL | Status: DC

## 2013-10-21 NOTE — Telephone Encounter (Signed)
How many refills on his Asacol would you like sent in Sir?  Thank you.

## 2013-10-21 NOTE — Telephone Encounter (Signed)
LM on patient's mobile VM that rx sent to [facility] as requested.

## 2013-10-21 NOTE — Telephone Encounter (Signed)
1 year ok

## 2014-02-12 ENCOUNTER — Encounter: Payer: Self-pay | Admitting: Internal Medicine

## 2014-05-31 ENCOUNTER — Telehealth: Payer: Self-pay | Admitting: Internal Medicine

## 2014-05-31 MED ORDER — PREDNISONE 10 MG PO TABS: ORAL_TABLET | ORAL | Status: DC

## 2014-05-31 NOTE — Telephone Encounter (Signed)
Follow up has been scheduled and med sent to the pharmacy

## 2014-05-31 NOTE — Telephone Encounter (Signed)
Prednisone 10 mg tabs  Take 4/day x 3 days Take 3/day x 3 days Take 2/day x 7 days Take 1/day x 14 days Take 1/2/ day x 14 days  Needs appt w/ me in 2-4 weeks and should call back sooner if that is not helping within a few days

## 2014-05-31 NOTE — Telephone Encounter (Signed)
Patient states that right before Christmas, he started having bright, red blood in stool. Reports sometimes the blood is a little darker.He also reports he has urgent loose stool after eating. He reports 6 loose stools/day Denies abdominal pain. He is taking Asacol 2 tablets TID. Please, advise.

## 2014-06-01 ENCOUNTER — Encounter: Payer: Self-pay | Admitting: Internal Medicine

## 2014-06-01 ENCOUNTER — Telehealth: Payer: Self-pay

## 2014-06-01 NOTE — Telephone Encounter (Signed)
Patient came into the office today requesting a jury duty excusal letter.  The form with the information needed has been placed on Dr. Celesta Aver desk for him to address when he returns next week from the hospital rotation.  Jury duty date is 06/17/14.

## 2014-06-02 NOTE — Telephone Encounter (Signed)
Spoke with patient and told him his jury letter is ready.  He is going to pick it up.

## 2014-06-22 ENCOUNTER — Other Ambulatory Visit (INDEPENDENT_AMBULATORY_CARE_PROVIDER_SITE_OTHER): Payer: BLUE CROSS/BLUE SHIELD

## 2014-06-22 ENCOUNTER — Encounter: Payer: Self-pay | Admitting: Internal Medicine

## 2014-06-22 ENCOUNTER — Telehealth: Payer: Self-pay | Admitting: Internal Medicine

## 2014-06-22 ENCOUNTER — Ambulatory Visit (INDEPENDENT_AMBULATORY_CARE_PROVIDER_SITE_OTHER): Payer: BLUE CROSS/BLUE SHIELD | Admitting: Internal Medicine

## 2014-06-22 VITALS — BP 138/84 | HR 79 | Ht 71.0 in | Wt 245.2 lb

## 2014-06-22 DIAGNOSIS — K5150 Left sided colitis without complications: Secondary | ICD-10-CM

## 2014-06-22 DIAGNOSIS — K648 Other hemorrhoids: Secondary | ICD-10-CM | POA: Insufficient documentation

## 2014-06-22 LAB — COMPREHENSIVE METABOLIC PANEL
ALT: 23 U/L (ref 0–53)
AST: 25 U/L (ref 0–37)
Albumin: 3.8 g/dL (ref 3.5–5.2)
Alkaline Phosphatase: 33 U/L — ABNORMAL LOW (ref 39–117)
BUN: 13 mg/dL (ref 6–23)
CO2: 30 mEq/L (ref 19–32)
CREATININE: 0.99 mg/dL (ref 0.40–1.50)
Calcium: 9.2 mg/dL (ref 8.4–10.5)
Chloride: 107 mEq/L (ref 96–112)
GFR: 99.09 mL/min (ref >60.00)
GLUCOSE: 109 mg/dL — AB (ref 70–99)
Potassium: 4.2 mEq/L (ref 3.5–5.1)
Sodium: 141 mEq/L (ref 135–145)
Total Bilirubin: 0.4 mg/dL (ref 0.2–1.2)
Total Protein: 6.8 g/dL (ref 6.0–8.3)

## 2014-06-22 LAB — CBC WITH DIFFERENTIAL/PLATELET
BASOS PCT: 0.3 % (ref 0.0–3.0)
Basophils Absolute: 0 10*3/uL (ref 0.0–0.1)
EOS PCT: 0.8 % (ref 0.0–5.0)
Eosinophils Absolute: 0 10*3/uL (ref 0.0–0.7)
HCT: 39.8 % (ref 39.0–52.0)
HEMOGLOBIN: 13.2 g/dL (ref 13.0–17.0)
LYMPHS ABS: 0.8 10*3/uL (ref 0.7–4.0)
LYMPHS PCT: 15.2 % (ref 12.0–46.0)
MCHC: 33.3 g/dL (ref 30.0–36.0)
MCV: 94.2 fl (ref 78.0–100.0)
MONOS PCT: 7.2 % (ref 3.0–12.0)
Monocytes Absolute: 0.4 10*3/uL (ref 0.1–1.0)
Neutro Abs: 4.2 10*3/uL (ref 1.4–7.7)
Neutrophils Relative %: 76.5 % (ref 43.0–77.0)
Platelets: 203 10*3/uL (ref 150.0–400.0)
RBC: 4.23 Mil/uL (ref 4.22–5.81)
RDW: 13.5 % (ref 11.5–15.5)
WBC: 5.5 10*3/uL (ref 4.0–10.5)

## 2014-06-22 LAB — C-REACTIVE PROTEIN: CRP: 0.1 mg/dL — AB (ref 0.5–20.0)

## 2014-06-22 MED ORDER — MESALAMINE 1.2 G PO TBEC: DELAYED_RELEASE_TABLET | ORAL | Status: DC

## 2014-06-22 MED ORDER — HYDROCORTISONE ACETATE 25 MG RE SUPP: 25.0000 mg | Freq: Every day | RECTAL | Status: DC

## 2014-06-22 MED ORDER — MESALAMINE 1.2 G PO TBEC
DELAYED_RELEASE_TABLET | ORAL | Status: DC
Start: 2014-06-22 — End: 2014-06-22

## 2014-06-22 NOTE — Assessment & Plan Note (Addendum)
DC Asacol Finish prednisone taper Start Lialda 2.4 g bid RTC 2 months CBC, CRP Info about immunomodulators given

## 2014-06-22 NOTE — Patient Instructions (Addendum)
Your physician has requested that you go to the basement for the following lab work before leaving today: CBC/diff, CMET, C-Reactive Protein  We are giving you information to read on Immunomodulators.  We have sent the following medications to your pharmacy for you to pick up at your convenience: Angel Frazier has been sent to your mail order pharmacy, rectal suppositories to help with the rectal bleeding and hemorrhoids sent to [facility].  The Lialda is to replace the asacol.  It is ok to use Imodium if you need it for diarrhea.  Follow up with Dr. Carlean Purl in 2 months.

## 2014-06-22 NOTE — Progress Notes (Signed)
Subjective:    Patient ID: Angel Frazier, male    DOB: 08-02-1955, 59 y.o.   MRN: [id number]  HPI  59 y/o presents for followup of his ulcerative colitis and rectal bleeding.  He has had a recent flare up and is currently on apPrednisone taper.  Complains of recurrent bloody diarrhea where he has good days (6-7 stool/day) and bad days (3-4 stools/day).  States he sees blood with every stool, both with wiping and in the toilet.  Condition has improved with the prednisone as he is having less diarrhea and is seeing less blood.  Also takes Asacol  but he doesn't think it is helping much.  Denies abdominal pain or distension, fever, and nausea/vomitting, and pain with defecation.     No Known Allergies Outpatient Prescriptions Prior to Visit  Medication Sig Dispense Refill  . aspirin 81 MG tablet Take 81 mg by mouth daily.      . Cholecalciferol (VITAMIN D3) 50000 UNITS CAPS Take 1 tablet by mouth once a week. 6 capsule 0  . Mesalamine (ASACOL HD) 800 MG TBEC Take 2 tablets (1,600 mg total) by mouth 3 (three) times daily. 540 tablet 3  . Omega-3 Fatty Acids (FISH OIL) 300 MG CAPS Take 2 capsules by mouth daily.      . predniSONE (DELTASONE) 10 MG tablet Take 4/day x 3 days Take 3/day x 3 days Take 2/day x 7 days Take 1/day x 14 days Take 1/2/ day x 14 days 100 tablet 0  . vardenafil (LEVITRA) 20 MG tablet Take 1 tablet (20 mg total) by mouth daily as needed for erectile dysfunction. 10 tablet 10  . vitamin B-12 (CYANOCOBALAMIN) 1000 MCG tablet Take 1,000 mcg by mouth daily.     No facility-administered medications prior to visit.   Past Medical History  Diagnosis Date  . Erectile dysfunction   . Left sided ulcerative colitis   . Hypertension   . Bleeding hemorrhoids   . Anabolic steroid abuse    Past Surgical History  Procedure Laterality Date  . Knee arthroscopy Right   . Colonoscopy      multiple  . Sigmoidoscopy     History   Social History  . Marital Status: Single    Spouse  Name: N/A    Number of Children: 1  . Years of Education: N/A   Occupational History  .     Social History Main Topics  . Smoking status: Former Research scientist (life sciences)  . Smokeless tobacco: Never Used  . Alcohol Use: 0.5 oz/week    1 drink(s) per week  . Drug Use: No  . Sexual Activity: None   Other Topics Concern  . None   Social History Narrative   Family History  Problem Relation Age of Onset  . Diabetes      cousin  . Hypertension    . Colon cancer Neg Hx   . Diabetes Mother         Review of Systems  Constitutional: Negative for fever, chills and fatigue.  Gastrointestinal: Positive for diarrhea and blood in stool. Negative for nausea, vomiting, abdominal pain, constipation, abdominal distention and rectal pain.  Genitourinary: Negative for dysuria, urgency and frequency.  Neurological: Negative for light-headedness and headaches.  Psychiatric/Behavioral: Negative for confusion and agitation.       Objective:   Physical Exam General: Well developed, well nourished, appears in no apparent distress HEENT: Anicteic Sclera. No pharyngeal erythema or exudates  Neck: Supple, no JVD, no masses  Cardiovascular:  RRR, S1 S2 auscultated, no rubs, murmurs or gallops.  Respiratory: Clear to auscultation bilaterally with equal chest rise  Abdomen: No tenderness to palpation, + BS, no masses.; No guarding or peritoneal signs Rectal Exam: Bleeding internal hemorrhoids seen on anoscopy. Extremities: warm dry without cyanosis clubbing.  Skin: Without rashes exudates or nodules.  Psych: Normal affect and demeanor with intact judgement and insight      Assessment & Plan:   Left-Sided Ulcerative Colitis: -Finish prednisone taper -Discontinue Asacol and start Lialda 2.4 g bid -RTC in 2 months -Ordered CBC, CRP  Internal Hemmorhoids- Bleeding -Seen on Anoscopy today. -Seems to be aggravated by diarrhea.  Will wait for colitis to be controlled before considering to repeat  ligation -Prescribed Hydrocortisone suppositories   Janifer Adie, PA-S, [place] 06/22/2014    I have personally seen the patient, reviewed and repeated key elements of the history and physical and participated in formation of the assessment and plan the student has documented. HC cream substituted for suppositories after visit due to formulary issues.   Gatha Mayer, MD, [facility]

## 2014-06-22 NOTE — Progress Notes (Signed)
Quick Note:  Let him know that labs look fine Hope the medication switch works  ______

## 2014-06-22 NOTE — Telephone Encounter (Signed)
Please advise Sir? 

## 2014-06-22 NOTE — Assessment & Plan Note (Addendum)
Seen at anoscopy today Diarrhea aggravating If colitis controlled then consider repeat ligation HC suppository

## 2014-06-23 MED ORDER — HYDROCORTISONE 2.5 % RE CREA: TOPICAL_CREAM | RECTAL | Status: DC

## 2014-06-23 NOTE — Telephone Encounter (Signed)
Try proctocream HC 2.5% apply into rectum qhs # 30 g with 2 RF

## 2014-06-23 NOTE — Telephone Encounter (Signed)
Patient notified of lab work and new rx

## 2014-07-20 ENCOUNTER — Other Ambulatory Visit (INDEPENDENT_AMBULATORY_CARE_PROVIDER_SITE_OTHER): Payer: BLUE CROSS/BLUE SHIELD

## 2014-07-20 DIAGNOSIS — Z0000 Encounter for general adult medical examination without abnormal findings: Secondary | ICD-10-CM

## 2014-07-20 LAB — CBC WITH DIFFERENTIAL/PLATELET
Basophils Absolute: 0 10*3/uL (ref 0.0–0.1)
Basophils Relative: 0.1 % (ref 0.0–3.0)
EOS ABS: 0.2 10*3/uL (ref 0.0–0.7)
Eosinophils Relative: 3.8 % (ref 0.0–5.0)
HCT: 39.4 % (ref 39.0–52.0)
HEMOGLOBIN: 13.3 g/dL (ref 13.0–17.0)
Lymphocytes Relative: 36 % (ref 12.0–46.0)
Lymphs Abs: 1.6 10*3/uL (ref 0.7–4.0)
MCHC: 33.6 g/dL (ref 30.0–36.0)
MCV: 93.1 fl (ref 78.0–100.0)
MONO ABS: 0.6 10*3/uL (ref 0.1–1.0)
Monocytes Relative: 12.5 % — ABNORMAL HIGH (ref 3.0–12.0)
NEUTROS ABS: 2.2 10*3/uL (ref 1.4–7.7)
NEUTROS PCT: 47.6 % (ref 43.0–77.0)
PLATELETS: 202 10*3/uL (ref 150.0–400.0)
RBC: 4.23 Mil/uL (ref 4.22–5.81)
RDW: 13.5 % (ref 11.5–15.5)
WBC: 4.6 10*3/uL (ref 4.0–10.5)

## 2014-07-20 LAB — BASIC METABOLIC PANEL
BUN: 13 mg/dL (ref 6–23)
CALCIUM: 9.1 mg/dL (ref 8.4–10.5)
CO2: 32 meq/L (ref 19–32)
CREATININE: 0.96 mg/dL (ref 0.40–1.50)
Chloride: 103 mEq/L (ref 96–112)
GFR: 102.65 mL/min (ref >60.00)
Glucose, Bld: 107 mg/dL — ABNORMAL HIGH (ref 70–99)
Potassium: 4.4 mEq/L (ref 3.5–5.1)
SODIUM: 140 meq/L (ref 135–145)

## 2014-07-20 LAB — LIPID PANEL
Cholesterol: 168 mg/dL (ref 0–200)
HDL: 50.9 mg/dL (ref >39.00)
LDL CALC: 105 mg/dL — AB (ref 0–99)
NONHDL: 117.1
Total CHOL/HDL Ratio: 3
Triglycerides: 63 mg/dL (ref 0.0–149.0)
VLDL: 12.6 mg/dL (ref 0.0–40.0)

## 2014-07-20 LAB — HEPATIC FUNCTION PANEL
ALBUMIN: 3.6 g/dL (ref 3.5–5.2)
ALK PHOS: 46 U/L (ref 39–117)
ALT: 20 U/L (ref 0–53)
AST: 21 U/L (ref 0–37)
Bilirubin, Direct: 0.1 mg/dL (ref 0.0–0.3)
Total Bilirubin: 0.5 mg/dL (ref 0.2–1.2)
Total Protein: 6.6 g/dL (ref 6.0–8.3)

## 2014-07-20 LAB — POCT URINALYSIS DIPSTICK
Bilirubin, UA: NEGATIVE
Glucose, UA: NEGATIVE
KETONES UA: NEGATIVE
Leukocytes, UA: NEGATIVE
Nitrite, UA: NEGATIVE
PH UA: 5.5
Protein, UA: NEGATIVE
RBC UA: NEGATIVE
Spec Grav, UA: 1.015
UROBILINOGEN UA: 0.2

## 2014-07-20 LAB — TSH: TSH: 1.18 u[IU]/mL (ref 0.35–4.50)

## 2014-07-20 LAB — PSA: PSA: 0.9 ng/mL (ref 0.10–4.00)

## 2014-07-29 ENCOUNTER — Telehealth: Payer: Self-pay | Admitting: Internal Medicine

## 2014-07-29 ENCOUNTER — Ambulatory Visit (INDEPENDENT_AMBULATORY_CARE_PROVIDER_SITE_OTHER): Payer: BLUE CROSS/BLUE SHIELD | Admitting: Family Medicine

## 2014-07-29 VITALS — BP 120/80 | Temp 98.3°F | Ht 70.5 in | Wt 242.0 lb

## 2014-07-29 DIAGNOSIS — K5150 Left sided colitis without complications: Secondary | ICD-10-CM

## 2014-07-29 DIAGNOSIS — K5190 Ulcerative colitis, unspecified, without complications: Secondary | ICD-10-CM

## 2014-07-29 DIAGNOSIS — Z0000 Encounter for general adult medical examination without abnormal findings: Secondary | ICD-10-CM

## 2014-07-29 DIAGNOSIS — K51911 Ulcerative colitis, unspecified with rectal bleeding: Secondary | ICD-10-CM

## 2014-07-29 DIAGNOSIS — N529 Male erectile dysfunction, unspecified: Secondary | ICD-10-CM

## 2014-07-29 LAB — TESTOSTERONE: TESTOSTERONE: 309.38 ng/dL (ref 300.00–890.00)

## 2014-07-29 MED ORDER — TADALAFIL 5 MG PO TABS: 5.0000 mg | ORAL_TABLET | Freq: Every day | ORAL | Status: DC | PRN

## 2014-07-29 NOTE — Patient Instructions (Signed)
Cialis 5 mg.......Marland Kitchen 1 daily............. you could actually take 1 Monday Wednesday Friday and it would be just as effective and most folks  Testosterone level today......Marland Kitchen we will call you the report  Return in one year for general physical exam sooner if any problems

## 2014-07-29 NOTE — Progress Notes (Signed)
   Subjective:    Patient ID: Angel Frazier, male    DOB: 08/05/1955, 59 y.o.   MRN: [id number]  HPI Saxton is a 59 year old male single nonsmoker who comes in today for general physical examination  He has a history of left-sided ulcerative colitis he's followed in GI by Dr. Rosamaria Lints and doing well. He's on spelled LIA LDA. 2.4 g twice a day. He also takes an aspirin tablet vitamin D and he uses Cialis 5 mg daily for ED  He sees Dr. Lestine Box and urology center yearly  He gets routine eye care, dental care, colonoscopy followed up by Dr. Lisa Roca G  Vaccinations up-to-date   Review of Systems  Constitutional: Negative.   HENT: Negative.   Eyes: Negative.   Respiratory: Negative.   Cardiovascular: Negative.   Gastrointestinal: Negative.   Endocrine: Negative.   Genitourinary: Negative.   Musculoskeletal: Negative.   Skin: Negative.   Allergic/Immunologic: Negative.   Neurological: Negative.   Hematological: Negative.   Psychiatric/Behavioral: Negative.        Objective:   Physical Exam  Constitutional: He is oriented to person, place, and time. He appears well-developed and well-nourished.  HENT:  Head: Normocephalic and atraumatic.  Right Ear: External ear normal.  Left Ear: External ear normal.  Nose: Nose normal.  Mouth/Throat: Oropharynx is clear and moist.  Eyes: Conjunctivae and EOM are normal. Pupils are equal, round, and reactive to light.  Neck: Normal range of motion. Neck supple. No JVD present. No tracheal deviation present. No thyromegaly present.  Cardiovascular: Normal rate, regular rhythm, normal heart sounds and intact distal pulses.  Exam reveals no gallop and no friction rub.   No murmur heard. No carotid nor aortic bruits  Pulmonary/Chest: Effort normal and breath sounds normal. No stridor. No respiratory distress. He has no wheezes. He has no rales. He exhibits no tenderness.  Abdominal: Soft. Bowel sounds are normal. He exhibits no distension and no mass.  There is no tenderness. There is no rebound and no guarding.  Genitourinary: Rectum normal, prostate normal and penis normal. Guaiac negative stool. No penile tenderness.  Musculoskeletal: Normal range of motion. He exhibits no edema or tenderness.  Lymphadenopathy:    He has no cervical adenopathy.  Neurological: He is alert and oriented to person, place, and time. He has normal reflexes. No cranial nerve deficit. He exhibits normal muscle tone.  Skin: Skin is warm and dry. No rash noted. No erythema. No pallor.  Total body skin exam normal he has a freckle on the dorsum of his left great toe. It's brown no dark pigmentation  Psychiatric: He has a normal mood and affect. His behavior is normal. Judgment and thought content normal.  Nursing note and vitals reviewed.         Assessment & Plan:  Healthy male  History of ulcerative colitis....... followed by Dr. Rosamaria Lints  Erectile dysfunction and low libido........ continue exercise...Marland KitchenMarland KitchenMarland Kitchen check testosterone level..... Cialis 5 mg daily

## 2014-07-29 NOTE — Progress Notes (Signed)
Pre visit review using our clinic review tool, if applicable. No additional management support is needed unless otherwise documented below in the visit note. 

## 2014-07-29 NOTE — Telephone Encounter (Signed)
I need to know is it diarrhea, rectal bleeding or both?

## 2014-07-29 NOTE — Telephone Encounter (Signed)
Please advise Sir? 

## 2014-07-29 NOTE — Telephone Encounter (Signed)
Left message for patient to call me back so I can get more details.

## 2014-07-30 MED ORDER — PREDNISONE 10 MG PO TABS: ORAL_TABLET | ORAL | Status: DC

## 2014-07-30 NOTE — Telephone Encounter (Signed)
Prednisone 10 mg tabs # 100 take 4 each day x 1 week then 3 each day x 1 week then 2 each day until he sees me Angel Frazier needs to see me in 2-3 weeks  If worse see APP   See if he can do a quantiferon gold, and TPMT (thiopurine methyltransferase) phenotype today or early next wee along with CBC and CRP re: ulcerative colitis dx

## 2014-07-30 NOTE — Telephone Encounter (Signed)
Spoke with patient and informed him what Dr. Carlean Purl advised.  Confirmed pharmacy to send in Prednisone to.  Patient headed to work now so he will come Monday to have labs drawn.  Aware that he needs appointment and he will either set it up Monday when he comes for labs or will call back when he has his schedule.

## 2014-07-30 NOTE — Telephone Encounter (Signed)
Left messages on both home and cell number to call me back to discuss symptoms.

## 2014-07-30 NOTE — Telephone Encounter (Signed)
Angel Frazier called back and is reporting both rectal bleeding and diarrhea up t 5 or 6 spells a day.  No fever.

## 2014-08-02 ENCOUNTER — Other Ambulatory Visit (INDEPENDENT_AMBULATORY_CARE_PROVIDER_SITE_OTHER): Payer: BLUE CROSS/BLUE SHIELD

## 2014-08-02 DIAGNOSIS — K51911 Ulcerative colitis, unspecified with rectal bleeding: Secondary | ICD-10-CM

## 2014-08-02 LAB — C-REACTIVE PROTEIN

## 2014-08-02 LAB — CBC WITH DIFFERENTIAL/PLATELET
Basophils Absolute: 0 10*3/uL (ref 0.0–0.1)
Basophils Relative: 0.3 % (ref 0.0–3.0)
Eosinophils Absolute: 0.1 10*3/uL (ref 0.0–0.7)
Eosinophils Relative: 1.3 % (ref 0.0–5.0)
HCT: 39.1 % (ref 39.0–52.0)
Hemoglobin: 13.2 g/dL (ref 13.0–17.0)
LYMPHS ABS: 2.6 10*3/uL (ref 0.7–4.0)
Lymphocytes Relative: 36 % (ref 12.0–46.0)
MCHC: 33.8 g/dL (ref 30.0–36.0)
MCV: 92.7 fl (ref 78.0–100.0)
Monocytes Absolute: 0.8 10*3/uL (ref 0.1–1.0)
Monocytes Relative: 10.7 % (ref 3.0–12.0)
NEUTROS ABS: 3.8 10*3/uL (ref 1.4–7.7)
Neutrophils Relative %: 51.7 % (ref 43.0–77.0)
Platelets: 263 10*3/uL (ref 150.0–400.0)
RBC: 4.22 Mil/uL (ref 4.22–5.81)
RDW: 13.4 % (ref 11.5–15.5)
WBC: 7.4 10*3/uL (ref 4.0–10.5)

## 2014-08-03 NOTE — Progress Notes (Signed)
Quick Note:  Labs are ok  ______

## 2014-08-05 LAB — QUANTIFERON TB GOLD ASSAY (BLOOD)
Mitogen value: 0.45 IU/mL
QUANTIFERON NIL VALUE: 0.02 [IU]/mL
Quantiferon Tb Ag Minus Nil Value: 0 IU/mL
TB AG VALUE: 0.02 [IU]/mL

## 2014-08-16 ENCOUNTER — Ambulatory Visit (INDEPENDENT_AMBULATORY_CARE_PROVIDER_SITE_OTHER): Payer: BLUE CROSS/BLUE SHIELD | Admitting: Internal Medicine

## 2014-08-16 ENCOUNTER — Encounter: Payer: Self-pay | Admitting: Internal Medicine

## 2014-08-16 VITALS — BP 138/84 | HR 100 | Ht 69.5 in | Wt 242.0 lb

## 2014-08-16 DIAGNOSIS — K5150 Left sided colitis without complications: Secondary | ICD-10-CM

## 2014-08-16 DIAGNOSIS — K648 Other hemorrhoids: Secondary | ICD-10-CM

## 2014-08-16 MED ORDER — PREDNISONE 10 MG PO TABS: ORAL_TABLET | ORAL | Status: DC

## 2014-08-16 NOTE — Assessment & Plan Note (Signed)
These may be bleeding some again but I don't think that the primary problem and I certainly wouldn't direct therapy towards the hemorrhoids while the diarrhea is still a problem.

## 2014-08-16 NOTE — Assessment & Plan Note (Addendum)
He has required prednisone now 2 times in the last few months. He had a flare while he was on Asacol HD. We changed to Lialda, and he is flared again after coming off the prednisone. He may need immunomodulators and/or biologic therapy. I introduced the topic to him today. I had him do a QuantiFERON which was indeterminate which might be related to his prednisone use. He would still need a hepatitis B surface antigen and a redo with a QuantiFERON versus a PPD which I would probably do if we go to Biologics. At this point we have decided to see if longer treatment on the Lialda and a slower taper prednisone will hold things as he has done pretty well over the years on mesalamine therapy. I don't think there is a rush to change therapy but if he does not respond changing to an immune modulator plus or minus biologic seems to be the next treatment. I have reviewed the risks benefits and indications those medications including increased risks of infections possible increased risk of lymphoma or leukemia and other possible side effects as well. Handouts from [facility] on immunomodulators in biologic therapies are provided. I will see him back in about 6-8 weeks.

## 2014-08-16 NOTE — Progress Notes (Signed)
   Subjective:    Patient ID: Angel Frazier, male    DOB: 02/05/1956, 59 y.o.   MRN: [id number] He complaint: Follow-up ulcerative colitis with rectal bleeding and diarrhea HPI Angel Frazier had another flare. He finished his prednisone taper earlier in the year from his flare that started at the end of 2015. I had changed him from Asacol HD to Lialda thinking may be flared through Asacol HD. The few weeks after finishing the prednisone restarted of diarrhea and rectal bleeding again so I initiated prednisone. He is on 20 mg daily. He was on 40 day for a week and 30 a day for a week and has had a rapid response moving towards normal defecation and no bleeding. He has not had any medication changes is not using any supplements or anything like that. Medications, allergies, past medical history, past surgical history, family history and social history are reviewed and updated in the EMR.  Review of Systems As above.    Objective:   Physical Exam BP 138/84 mmHg  Pulse 100  Ht 5' 9.5" (1.765 m)  Wt 242 lb (109.77 kg)  BMI 35.24 kg/m2 Robust obese black man in no acute distress    Assessment & Plan:  LEFT SIDED ULCERATIVE COLITIS He has required prednisone now 2 times in the last few months. He had a flare while he was on Asacol HD. We changed to Lialda, and he is flared again after coming off the prednisone. He may need immunomodulators and/or biologic therapy. I introduced the topic to him today. I had him do a QuantiFERON which was indeterminate which might be related to his prednisone use. He would still need a hepatitis B surface antigen and a redo with a QuantiFERON versus a PPD which I would probably do if we go to Biologics. At this point we have decided to see if longer treatment on the Lialda and a slower taper prednisone will hold things as he has done pretty well over the years on mesalamine therapy. I don't think there is a rush to change therapy but if he does not respond changing to an immune  modulator plus or minus biologic seems to be the next treatment. I have reviewed the risks benefits and indications those medications including increased risks of infections possible increased risk of lymphoma or leukemia and other possible side effects as well. Handouts from [facility] on immunomodulators in biologic therapies are provided. I will see him back in about 6-8 weeks.      Hemorrhoids, internal, with bleeding These may be bleeding some again but I don't think that the primary problem and I certainly wouldn't direct therapy towards the hemorrhoids while the diarrhea is still a problem.   15 minutes time spent with patient > half in counseling coordination of care

## 2014-08-16 NOTE — Patient Instructions (Signed)
We have refilled your Prednisone, please take as follows:  March 16th start 55m daily, then March 23 start 137mdaily, then April 6th go to 5 mg daily and on April 20th stop the prednisone.   Follow up with Dr. GeCarlean Purln 3 months.   Today you have been given information to read on Biologics and Immunomodulators.   I appreciate the opportunity to care for you. CaSilvano RuskM.D., [facility]

## 2014-08-17 ENCOUNTER — Telehealth: Payer: Self-pay

## 2014-08-17 LAB — THIOPURINE METHYLTRANSFERASE (TPMT), RBC: Thiopurine Methyltransferase, RBC: 16 nmol/hr/mL RBC

## 2014-08-17 NOTE — Telephone Encounter (Signed)
Spoke with patient and informed him that his FMLA papers were mailed out March 10th per Posey Pronto in [employer]  I told him he can reach her at [phone] ext [phone] if he needs her assistance.

## 2014-09-09 ENCOUNTER — Ambulatory Visit: Payer: BLUE CROSS/BLUE SHIELD | Admitting: Internal Medicine

## 2014-11-17 ENCOUNTER — Ambulatory Visit (INDEPENDENT_AMBULATORY_CARE_PROVIDER_SITE_OTHER): Payer: BLUE CROSS/BLUE SHIELD | Admitting: Internal Medicine

## 2014-11-17 ENCOUNTER — Encounter (INDEPENDENT_AMBULATORY_CARE_PROVIDER_SITE_OTHER): Payer: Self-pay

## 2014-11-17 ENCOUNTER — Other Ambulatory Visit (INDEPENDENT_AMBULATORY_CARE_PROVIDER_SITE_OTHER): Payer: BLUE CROSS/BLUE SHIELD

## 2014-11-17 ENCOUNTER — Encounter: Payer: Self-pay | Admitting: Internal Medicine

## 2014-11-17 VITALS — BP 138/88 | HR 84 | Ht 69.5 in | Wt 254.1 lb

## 2014-11-17 DIAGNOSIS — Z7952 Long term (current) use of systemic steroids: Secondary | ICD-10-CM

## 2014-11-17 DIAGNOSIS — E559 Vitamin D deficiency, unspecified: Secondary | ICD-10-CM

## 2014-11-17 DIAGNOSIS — M87059 Idiopathic aseptic necrosis of unspecified femur: Secondary | ICD-10-CM

## 2014-11-17 DIAGNOSIS — K5150 Left sided colitis without complications: Secondary | ICD-10-CM | POA: Diagnosis not present

## 2014-11-17 LAB — VITAMIN D 25 HYDROXY (VIT D DEFICIENCY, FRACTURES): VITD: 45.29 ng/mL (ref 30.00–100.00)

## 2014-11-17 NOTE — Assessment & Plan Note (Addendum)
In remission on Lialda alone after prolonged steroid taper If another flare biologics vs immunomodulators is plan DEXA and vit D level today Get copies of Dr. Reather Littler notes

## 2014-11-17 NOTE — Assessment & Plan Note (Addendum)
Recheck ok at 62

## 2014-11-17 NOTE — Progress Notes (Signed)
Subjective:    Patient ID: Angel Frazier, male    DOB: [birth year], 59 y.o.   MRN: [id number] Cc: f/u ulcerative clitis HPI Here for f/u after flaring in the winter and required a few months of prednisone with tapers. Stools normal Rare bleeding with wiping and hard stool Out of work - back problems - to get an epidural next week Also w/ hip pain - right MRI showed poor circulation to the hips??? No Known Allergies Outpatient Prescriptions Prior to Visit  Medication Sig Dispense Refill  . Ascorbic Acid (VITAMIN C) 1000 MG tablet Take 1,000 mg by mouth daily.    Marland Kitchen aspirin 81 MG tablet Take 81 mg by mouth daily.      . Calcium Carbonate-Vitamin D (CALCIUM-VITAMIN D3 PO) Take 1 tablet by mouth daily. Calcium 1200 vit d3 plus    . Cholecalciferol (VITAMIN D3) 50000 UNITS CAPS Take 1 tablet by mouth once a week. 6 capsule 0  . mesalamine (LIALDA) 1.2 G EC tablet 2 tablets bid 360 tablet 3  . Multiple Vitamins-Minerals (CENTRUM SILVER PO) Take 1 tablet by mouth daily.    . Omega-3 Fatty Acids (FISH OIL) 300 MG CAPS Take 2 capsules by mouth daily.      . tadalafil (CIALIS) 5 MG tablet Take 1 tablet (5 mg total) by mouth daily as needed for erectile dysfunction. 100 tablet 3  . vitamin B-12 (CYANOCOBALAMIN) 1000 MCG tablet Take 2,500 mcg by mouth daily.     . predniSONE (DELTASONE) 10 MG tablet Taper as directed 100 tablet 0   No facility-administered medications prior to visit.   Past Medical History  Diagnosis Date  . Erectile dysfunction   . Left sided ulcerative colitis   . Hypertension   . Bleeding hemorrhoids   . Anabolic steroid abuse   .  vitamin D deficiency 01/24/2013    01/23/13 - level 29 - supplements 50k IU weekly x 6 recommended   . Avascular necrosis of femoral head     bilateral  . Spinal stenosis of lumbar region at multiple levels    Past Surgical History  Procedure Laterality Date  . Knee arthroscopy Right   . Colonoscopy      multiple  . Sigmoidoscopy    .  Hemorrhoid banding  2012    With sigmoidoscopy   History   Social History  . Marital Status: Single    Spouse Name: N/A  . Number of Children: 1  . Years of Education: N/A   Occupational History  .     Social History Main Topics  . Smoking status: Former Research scientist (life sciences)  . Smokeless tobacco: Never Used  . Alcohol Use: 0.5 oz/week    1 drink(s) per week  . Drug Use: No  . Sexual Activity: Not on file   Other Topics Concern  . None   Social History Narrative   Family History  Problem Relation Age of Onset  . Diabetes      cousin  . Hypertension    . Colon cancer Neg Hx   . Diabetes Mother        Review of Systems As above    Objective:   Physical Exam BP 142/90 mmHg  Pulse 84  Ht 5' 9.5" (1.765 m)  Wt 254 lb 2 oz (115.27 kg)  BMI 37.00 kg/m2   I have requested records and reviewed notes and MRI from Dr. Tonita Frazier: 10/09/2014 MRI L spine L3-4 and L4-5 spinal stenosis 10/27/14 MR hips - bilateral AVN  femoral heads, bilateral hip space narrowing with degenerative hanges, right acetabular labral degeneration, righ gluteus minimus tendinitis       Assessment & Plan:  LEFT SIDED ULCERATIVE COLITIS In remission on Lialda alone after prolonged steroid taper If another flare biologics vs immunomodulators is plan DEXA and vit D level today Get copies of Dr. Reather Frazier notes  Vitamin D deficiency Recheck ok at 86   Avascular necrosis of femoral heads Do not think amount and length of steroid Tx related - ? If prior anabolic steroids related  Long term current use of systemic steroids DEXA scan will be done Vit D level - was ok at 45   Cc: Angel Day, MD

## 2014-11-17 NOTE — Patient Instructions (Signed)
   Your physician has requested that you go to the basement for the following lab work before leaving today: Vit.D level  You have an appointment for a DEXA SCAN on June 22nd 2016 at 9:30AM, arrive at 9:15AM in our x-ray department in the basement of our building.   We are going to obtain your records from Dr [name] for Dr Carlean Purl to review.   I appreciate the opportunity to care for you. Silvano Rusk, M.D., [facility]

## 2014-11-18 ENCOUNTER — Encounter: Payer: Self-pay | Admitting: Internal Medicine

## 2014-11-18 DIAGNOSIS — Z7952 Long term (current) use of systemic steroids: Secondary | ICD-10-CM | POA: Insufficient documentation

## 2014-11-18 DIAGNOSIS — Z8739 Personal history of other diseases of the musculoskeletal system and connective tissue: Secondary | ICD-10-CM | POA: Insufficient documentation

## 2014-11-18 DIAGNOSIS — M87059 Idiopathic aseptic necrosis of unspecified femur: Secondary | ICD-10-CM | POA: Insufficient documentation

## 2014-11-18 NOTE — Progress Notes (Signed)
Quick Note:  Vit d aok  ______

## 2014-11-18 NOTE — Assessment & Plan Note (Signed)
DEXA scan will be done Vit D level - was ok at 45

## 2014-11-18 NOTE — Assessment & Plan Note (Signed)
Do not think amount and length of steroid Tx related - ? If prior anabolic steroids related

## 2014-11-24 ENCOUNTER — Ambulatory Visit (INDEPENDENT_AMBULATORY_CARE_PROVIDER_SITE_OTHER)
Admission: RE | Admit: 2014-11-24 | Discharge: 2014-11-24 | Disposition: A | Discharge status: Home / Self Care | Payer: BLUE CROSS/BLUE SHIELD | Source: Ambulatory Visit | Attending: Internal Medicine | Admitting: Internal Medicine

## 2014-11-24 DIAGNOSIS — Z7952 Long term (current) use of systemic steroids: Secondary | ICD-10-CM | POA: Diagnosis not present

## 2014-12-07 NOTE — Progress Notes (Signed)
Quick Note:  Bones are normal density (thickness)- let him know  Good news! ______

## 2015-05-09 ENCOUNTER — Telehealth: Payer: Self-pay | Admitting: Internal Medicine

## 2015-05-09 NOTE — Telephone Encounter (Signed)
Left message for patient to call back  

## 2015-05-10 NOTE — Telephone Encounter (Signed)
Patient reports that he is having diarrhea and rectal bleeding.  He will come in and see Dr. Carlean Purl on 05/13/15 11:15

## 2015-05-13 ENCOUNTER — Encounter: Payer: Self-pay | Admitting: Internal Medicine

## 2015-05-13 ENCOUNTER — Ambulatory Visit (INDEPENDENT_AMBULATORY_CARE_PROVIDER_SITE_OTHER): Payer: BLUE CROSS/BLUE SHIELD | Admitting: Internal Medicine

## 2015-05-13 VITALS — BP 124/84 | HR 84 | Ht 69.5 in | Wt 258.5 lb

## 2015-05-13 DIAGNOSIS — K648 Other hemorrhoids: Secondary | ICD-10-CM | POA: Diagnosis not present

## 2015-05-13 DIAGNOSIS — K5150 Left sided colitis without complications: Secondary | ICD-10-CM | POA: Diagnosis not present

## 2015-05-13 DIAGNOSIS — F439 Reaction to severe stress, unspecified: Secondary | ICD-10-CM

## 2015-05-13 DIAGNOSIS — Z658 Other specified problems related to psychosocial circumstances: Secondary | ICD-10-CM | POA: Diagnosis not present

## 2015-05-13 MED ORDER — MESALAMINE 1.2 G PO TBEC: DELAYED_RELEASE_TABLET | ORAL | Status: DC

## 2015-05-13 MED ORDER — MESALAMINE 1000 MG RE SUPP: 1000.0000 mg | Freq: Every day | RECTAL | Status: DC

## 2015-05-13 NOTE — Assessment & Plan Note (Addendum)
Mild flare suspected question if it's just distal or more proximal. Trying to avoid steroids given his overall history and repeated use. Stress influence on disease suspected, as he is concerned about his inability to work. Canasa suppository 1000 mg per rectum nightly return in 2 months. If this doesn't work he could need further diagnostic evaluation versus going on to more aggressive therapy most likely with Biologics. He had an indeterminate QuantiFERON back before when he was on prednisone whether we repeat that her do a PPD are both needs to be sorted out depending upon what is done. He had been treated with steroids earlier in the year but went for several months doing well.

## 2015-05-13 NOTE — Patient Instructions (Addendum)
We have sent the following medications to your pharmacy for you to pick up at your convenience: Georgiann Mccoy   We have sent the following prescriptions to your mail in pharmacy:  [facility] If you have not heard from your mail in pharmacy within 1 week or if you have not received your medication in the mail, please contact us at [phone] so we may find out why.   We made you a follow up appointment with Dr Carlean Purl for 07/12/2015 at 8:30AM.   I appreciate the opportunity to care for you. Silvano Rusk, MD, [facility]

## 2015-05-13 NOTE — Progress Notes (Signed)
   Subjective:    Patient ID: Angel Frazier, male    DOB: December 17, 1955, 59 y.o.   MRN: [id number] Chief complaint: Rectal bleeding and diarrhea, colitis flare HPI Angel Frazier is here and planning of 2-3 weeks of looser stools and some intermittent rectal bleeding. He wonders if the stress of him being out of work for several months and contemplating disability is related to this. He denies abdominal pain or fever.  Wt Readings from Last 3 Encounters:  05/13/15 258 lb 8 oz (117.255 kg)  11/17/14 254 lb 2 oz (115.27 kg)  08/16/14 242 lb (109.77 kg)  Medications, allergies, past medical history, past surgical history, family history and social history are reviewed and updated in the EMR.   Review of Systems Back pain, using a brace, has had physical therapy. He is reluctant to have surgery as relief cannot be guaranteed.    Objective:   Physical Exam BP 124/84 mmHg  Pulse 84  Ht 5' 9.5" (1.765 m)  Wt 258 lb 8 oz (117.255 kg)  BMI 37.64 kg/m2 Abdomen is soft and nontender Rectal exam inspection of the anoderm is normal  Anoscopy is performed which demonstrates grade 2 internal hemorrhoids and what I think is some proctitis. Erythema, mucoid discharge seen.     Assessment & Plan:   1. LEFT SIDED ULCERATIVE COLITIS   2. Hemorrhoids, internal, with bleeding   3. Situational stress    LEFT SIDED ULCERATIVE COLITIS Mild flare suspected question if it's just distal or more proximal. Trying to avoid steroids given his overall history and repeated use. Stress influence on disease suspected, as he is concerned about his inability to work. Canasa suppository 1000 mg per rectum nightly return in 2 months. If this doesn't work he could need further diagnostic evaluation versus going on to more aggressive therapy most likely with Biologics. He had an indeterminate QuantiFERON back before when he was on prednisone whether we repeat that her do a PPD are both needs to be sorted out depending upon what is  done. He had been treated with steroids earlier in the year but went for several months doing well.  Hemorrhoids, internal, with bleeding These are probably contributing some but it secondary to the primary problem i.e. the colitis   15 minutes time spent with patient > half in counseling coordination of care

## 2015-05-13 NOTE — Assessment & Plan Note (Signed)
These are probably contributing some but it secondary to the primary problem i.e. the colitis

## 2015-05-24 ENCOUNTER — Telehealth: Payer: Self-pay | Admitting: Internal Medicine

## 2015-05-24 NOTE — Telephone Encounter (Signed)
Tried [facility] again and system still down to check on patients Lialda rx.  Will try again tomorrow, patient informed.

## 2015-05-24 NOTE — Telephone Encounter (Signed)
Patient has not heard from his mail order pharmacy about his Lialda order.  I called [facility] and their system is down so I will call them back in an hour, patient aware.

## 2015-05-25 NOTE — Telephone Encounter (Signed)
Spoke with [facility] at [phone].  They had been trying to reach patient about payment for his mail order.  I called and told Angel Frazier to call them and give them ref # [id number] and they will be able to ship his rx.

## 2015-06-05 DIAGNOSIS — E119 Type 2 diabetes mellitus without complications: Secondary | ICD-10-CM

## 2015-06-05 HISTORY — DX: Type 2 diabetes mellitus without complications: E11.9

## 2015-06-14 ENCOUNTER — Emergency Department (INDEPENDENT_AMBULATORY_CARE_PROVIDER_SITE_OTHER)
Admission: EM | Admit: 2015-06-14 | Discharge: 2015-06-14 | Disposition: A | Discharge status: Home / Self Care | Payer: BLUE CROSS/BLUE SHIELD | Source: Home / Self Care | Attending: Emergency Medicine | Admitting: Emergency Medicine

## 2015-06-14 ENCOUNTER — Encounter (HOSPITAL_COMMUNITY): Payer: Self-pay | Admitting: Emergency Medicine

## 2015-06-14 ENCOUNTER — Emergency Department (INDEPENDENT_AMBULATORY_CARE_PROVIDER_SITE_OTHER): Payer: BLUE CROSS/BLUE SHIELD

## 2015-06-14 DIAGNOSIS — M5412 Radiculopathy, cervical region: Secondary | ICD-10-CM

## 2015-06-14 MED ORDER — PREDNISONE 10 MG PO TABS: ORAL_TABLET | ORAL | Status: DC

## 2015-06-14 MED ORDER — OXYCODONE-ACETAMINOPHEN 5-325 MG PO TABS: 2.0000 | ORAL_TABLET | ORAL | Status: DC | PRN

## 2015-06-14 NOTE — ED Provider Notes (Signed)
CSN: [id number]     Arrival date & time 06/14/15  1340 History   First MD Initiated Contact with Patient 06/14/15 1529     Chief Complaint  Patient presents with  . Elbow Pain   (Consider location/radiation/quality/duration/timing/severity/associated sxs/prior Treatment) Patient is a 60 y.o. male presenting with shoulder pain. The history is provided by the patient. No language interpreter was used.  Shoulder Pain Location:  Shoulder Injury: no   Shoulder location:  R shoulder Pain details:    Quality:  Aching   Radiates to:  R arm   Severity:  Moderate   Onset quality:  Gradual   Timing:  Constant   Progression:  Worsening Chronicity:  New Dislocation: no   Foreign body present:  No foreign bodies Prior injury to area:  No Relieved by:  Nothing Worsened by:  Nothing tried Ineffective treatments:  None tried Associated symptoms: tingling   Associated symptoms: no back pain and no swelling   Pt complains of pain in his right arm.  Pt reports when he holds his arm up, pain decreases, when he lets arm hang down, arm feels numb and painful in shoulder.   Pt had neck stiffness several days ago but that resolved,  Now all pain is in arm. Past Medical History  Diagnosis Date  . Erectile dysfunction   . Left sided ulcerative colitis ([place])   . Hypertension   . Bleeding hemorrhoids   . Anabolic steroid abuse   .  vitamin D deficiency 01/24/2013    01/23/13 - level 29 - supplements 50k IU weekly x 6 recommended   . Avascular necrosis of femoral head ([place])     bilateral  . Spinal stenosis of lumbar region at multiple levels    Past Surgical History  Procedure Laterality Date  . Knee arthroscopy Right   . Colonoscopy      multiple  . Sigmoidoscopy    . Hemorrhoid banding  2012    With sigmoidoscopy   Family History  Problem Relation Age of Onset  . Diabetes      cousin  . Hypertension    . Colon cancer Neg Hx   . Diabetes Mother    Social History  Substance Use Topics   . Smoking status: Former Research scientist (life sciences)  . Smokeless tobacco: Never Used  . Alcohol Use: 0.5 oz/week    1 drink(s) per week    Review of Systems  Musculoskeletal: Negative for back pain.  All other systems reviewed and are negative.   Allergies  Review of patient's allergies indicates no known allergies.  Home Medications   Prior to Admission medications   Medication Sig Start Date End Date Taking? Authorizing Provider  cyclobenzaprine (FLEXERIL) 10 MG tablet Take 10 mg by mouth 3 (three) times daily as needed for muscle spasms.   Yes Historical Provider, MD  METHOCARBAMOL PO Take by mouth.   Yes Historical Provider, MD  Ascorbic Acid (VITAMIN C) 1000 MG tablet Take 1,000 mg by mouth daily.    Historical Provider, MD  aspirin 81 MG tablet Take 81 mg by mouth daily.      Historical Provider, MD  Calcium Carbonate-Vitamin D (CALCIUM-VITAMIN D3 PO) Take 1 tablet by mouth daily. Calcium 1200 vit d3 plus    Historical Provider, MD  Cholecalciferol (VITAMIN D3) 50000 UNITS CAPS Take 1 tablet by mouth once a week. 01/26/13   Gatha Mayer, MD  mesalamine (CANASA) 1000 MG suppository Place 1 suppository (1,000 mg total) rectally at bedtime. 05/13/15  Gatha Mayer, MD  mesalamine Doristine Johns) 1.2 G EC tablet 2 tablets bid 05/13/15   Gatha Mayer, MD  Multiple Vitamins-Minerals (CENTRUM SILVER PO) Take 1 tablet by mouth daily.    Historical Provider, MD  Omega-3 Fatty Acids (FISH OIL) 300 MG CAPS Take 2 capsules by mouth daily.      Historical Provider, MD  tadalafil (CIALIS) 5 MG tablet Take 1 tablet (5 mg total) by mouth daily as needed for erectile dysfunction. 07/29/14   Dorena Cookey, MD  vitamin B-12 (CYANOCOBALAMIN) 1000 MCG tablet Take 2,500 mcg by mouth daily.     Historical Provider, MD   Meds Ordered and Administered this Visit  Medications - No data to display  BP 130/99 mmHg  Pulse 86  Temp(Src) 98.4 F (36.9 C) (Oral)  Resp 16  SpO2 97% No data found.   Physical Exam   Constitutional: He is oriented to person, place, and time. He appears well-developed and well-nourished.  HENT:  Head: Normocephalic.  Eyes: EOM are normal. Pupils are equal, round, and reactive to light.  Neck: Normal range of motion.  cspine from, nv and ns intact   Cardiovascular: Normal rate.   Pulmonary/Chest: Effort normal.  Abdominal: Soft. He exhibits no distension.  Musculoskeletal: Normal range of motion.  Shoulder from  Elbow from,  Good pulses.  nv and ns intact  Neurological: He is alert and oriented to person, place, and time.  Psychiatric: He has a normal mood and affect.  Nursing note and vitals reviewed.   ED Course  Procedures (including critical care time)  Labs Review Labs Reviewed - No data to display  Imaging Review Dg Cervical Spine Complete  06/14/2015  CLINICAL DATA:  Left neck and upper extremity pain without injury. EXAM: CERVICAL SPINE - COMPLETE 4+ VIEW COMPARISON:  None. FINDINGS: No fracture is noted. Minimal grade 1 anterolisthesis of C4-5 is noted secondary to posterior facet joint hypertrophy. Moderate degenerative disc disease is noted at C5-6 with anterior osteophyte formation. Mild degenerative disc disease is noted at C6-7 with anterior osteophyte formation. Mild neural foraminal stenosis is noted on the right at C4-5 and C5-6 secondary to uncovertebral spurring. Mild neural foraminal stenosis is noted on the left at C4-5. IMPRESSION: Multilevel degenerative disc disease. No acute abnormality seen in the cervical spine. Electronically Signed   By: Marijo Conception, M.D.   On: 06/14/2015 16:14     Visual Acuity Review  Right Eye Distance:   Left Eye Distance:   Bilateral Distance:    Right Eye Near:   Left Eye Near:    Bilateral Near:         MDM  Pt is followed by Dr. Tonita Cong for back problems.   I will try treating him with Prednisone and pain medication.   I advised him to call Dr. Tonita Cong to be seen and evaluated.   1. Acute cervical  radiculopathy    Meds ordered this encounter  Medications  . METHOCARBAMOL PO    Sig: Take by mouth.  . cyclobenzaprine (FLEXERIL) 10 MG tablet    Sig: Take 10 mg by mouth 3 (three) times daily as needed for muscle spasms.  Marland Kitchen oxyCODONE-acetaminophen (PERCOCET/ROXICET) 5-325 MG tablet    Sig: Take 2 tablets by mouth every 4 (four) hours as needed for severe pain.    Dispense:  20 tablet    Refill:  0    Order Specific Question:  Supervising Provider    Answer:  Melony Overly [  [id number]]  . predniSONE (DELTASONE) 10 MG tablet    Sig: 6,5,4,3,2,1 taper    Dispense:  21 tablet    Refill:  0    Order Specific Question:  Supervising Provider    Answer:  Melony Overly [place], PA-C 06/14/15 1948

## 2015-06-14 NOTE — Discharge Instructions (Signed)

## 2015-06-14 NOTE — ED Notes (Signed)
Reports a 3 days history of right neck pain (crick in neck).  Right shoulder and right arm.  Right elbow feels like "funny bone" has been struck.  Allowing right arm to hang at his side is very painful.  Neck seems to be improved.  No known injury

## 2015-06-22 ENCOUNTER — Ambulatory Visit: Payer: BLUE CROSS/BLUE SHIELD | Admitting: Adult Health

## 2015-06-22 ENCOUNTER — Encounter: Payer: Self-pay | Admitting: Adult Health

## 2015-06-22 ENCOUNTER — Ambulatory Visit (INDEPENDENT_AMBULATORY_CARE_PROVIDER_SITE_OTHER): Payer: BLUE CROSS/BLUE SHIELD | Admitting: Adult Health

## 2015-06-22 VITALS — BP 130/82 | Temp 97.7°F | Ht 69.5 in | Wt 259.4 lb

## 2015-06-22 DIAGNOSIS — I10 Essential (primary) hypertension: Secondary | ICD-10-CM | POA: Diagnosis not present

## 2015-06-22 NOTE — Progress Notes (Signed)
Pre visit review using our clinic review tool, if applicable. No additional management support is needed unless otherwise documented below in the visit note. 

## 2015-06-22 NOTE — Patient Instructions (Signed)
You continue to have boarder line high blood pressure. I do not think it needs to be treated with medication at this time. Continue to monitor your blood pressure at home. Follow up with Dr. Sherren Mocha or myself if you notice your blood pressure is going into the 140-150's/90's.

## 2015-06-22 NOTE — Progress Notes (Signed)
Subjective:    Patient ID: Angel Frazier, male    DOB: 1955/10/04, 60 y.o.   MRN: [id number]  HPI  60 year old male, patient of Dr. Honor Junes, who presents to the office today for the complaint of elevated blood pressure. He reports that his blood pressure over the last week has been in the 308'M systolic/ 57'Q diastolic.   He is not currently on any blood pressure medication. He has been board[place] line high for " 3-4 years"  Denies any headaches or blurred vision.    Review of Systems  Constitutional: Negative.   Respiratory: Negative.   Cardiovascular: Negative.   Neurological: Negative.    Past Medical History  Diagnosis Date  . Erectile dysfunction   . Left sided ulcerative colitis ([place])   . Hypertension   . Bleeding hemorrhoids   . Anabolic steroid abuse   .  vitamin D deficiency 01/24/2013    01/23/13 - level 29 - supplements 50k IU weekly x 6 recommended   . Avascular necrosis of femoral head ([place])     bilateral  . Spinal stenosis of lumbar region at multiple levels     Social History   Social History  . Marital Status: Single    Spouse Name: N/A  . Number of Children: 1  . Years of Education: N/A   Occupational History  .     Social History Main Topics  . Smoking status: Former Research scientist (life sciences)  . Smokeless tobacco: Never Used  . Alcohol Use: 0.5 oz/week    1 drink(s) per week  . Drug Use: No  . Sexual Activity: Not on file   Other Topics Concern  . Not on file   Social History Narrative    Past Surgical History  Procedure Laterality Date  . Knee arthroscopy Right   . Colonoscopy      multiple  . Sigmoidoscopy    . Hemorrhoid banding  2012    With sigmoidoscopy    Family History  Problem Relation Age of Onset  . Diabetes      cousin  . Hypertension    . Colon cancer Neg Hx   . Diabetes Mother     No Known Allergies  Current Outpatient Prescriptions on File Prior to Visit  Medication Sig Dispense Refill  . Ascorbic Acid (VITAMIN C) 1000 MG  tablet Take 1,000 mg by mouth daily.    Marland Kitchen aspirin 81 MG tablet Take 81 mg by mouth daily.      . Calcium Carbonate-Vitamin D (CALCIUM-VITAMIN D3 PO) Take 1 tablet by mouth daily. Calcium 1200 vit d3 plus    . Cholecalciferol (VITAMIN D3) 50000 UNITS CAPS Take 1 tablet by mouth once a week. 6 capsule 0  . cyclobenzaprine (FLEXERIL) 10 MG tablet Take 10 mg by mouth 3 (three) times daily as needed for muscle spasms.    . mesalamine (CANASA) 1000 MG suppository Place 1 suppository (1,000 mg total) rectally at bedtime. 30 suppository 12  . mesalamine (LIALDA) 1.2 G EC tablet 2 tablets bid 360 tablet 3  . METHOCARBAMOL PO Take by mouth.    . Multiple Vitamins-Minerals (CENTRUM SILVER PO) Take 1 tablet by mouth daily.    . Omega-3 Fatty Acids (FISH OIL) 300 MG CAPS Take 2 capsules by mouth daily.      Marland Kitchen oxyCODONE-acetaminophen (PERCOCET/ROXICET) 5-325 MG tablet Take 2 tablets by mouth every 4 (four) hours as needed for severe pain. 20 tablet 0  . tadalafil (CIALIS) 5 MG tablet Take 1  tablet (5 mg total) by mouth daily as needed for erectile dysfunction. 100 tablet 3  . vitamin B-12 (CYANOCOBALAMIN) 1000 MCG tablet Take 2,500 mcg by mouth daily.      No current facility-administered medications on file prior to visit.    BP 130/82 mmHg  Temp(Src) 97.7 F (36.5 C) (Oral)  Ht 5' 9.5" (1.765 m)  Wt 259 lb 6.4 oz (117.663 kg)  BMI 37.77 kg/m2       Objective:   Physical Exam  Constitutional: He is oriented to person, place, and time. He appears well-developed and well-nourished. No distress.  Cardiovascular: Normal rate, regular rhythm, normal heart sounds and intact distal pulses.  Exam reveals no gallop and no friction rub.   No murmur heard. Pulmonary/Chest: Effort normal and breath sounds normal. No respiratory distress. He has no wheezes. He has no rales. He exhibits no tenderness.  Neurological: He is alert and oriented to person, place, and time.  Skin: Skin is warm and dry. No rash  noted. He is not diaphoretic. No erythema. No pallor.  Psychiatric: He has a normal mood and affect. His behavior is normal. Judgment and thought content normal.  Nursing note and vitals reviewed.     Assessment & Plan:  1. Essential hypertension - He has been on prednisone therapy for the last week and a half for a issue with his right shoulder and he is still experiencing some pain. I am not going to treat him at this time. He is to continue to monitor and inform us if his blood pressure continues to go above high 130's/90 - Follow up as needed

## 2015-07-12 ENCOUNTER — Ambulatory Visit (INDEPENDENT_AMBULATORY_CARE_PROVIDER_SITE_OTHER): Payer: BLUE CROSS/BLUE SHIELD | Admitting: Internal Medicine

## 2015-07-12 ENCOUNTER — Other Ambulatory Visit (INDEPENDENT_AMBULATORY_CARE_PROVIDER_SITE_OTHER): Payer: BLUE CROSS/BLUE SHIELD

## 2015-07-12 ENCOUNTER — Encounter: Payer: Self-pay | Admitting: Internal Medicine

## 2015-07-12 VITALS — BP 122/90 | HR 88 | Ht 69.5 in | Wt 261.4 lb

## 2015-07-12 DIAGNOSIS — K5150 Left sided colitis without complications: Secondary | ICD-10-CM

## 2015-07-12 DIAGNOSIS — Z7952 Long term (current) use of systemic steroids: Secondary | ICD-10-CM | POA: Diagnosis not present

## 2015-07-12 DIAGNOSIS — Z7960 Long term (current) use of unspecified immunomodulators and immunosuppressants: Secondary | ICD-10-CM

## 2015-07-12 DIAGNOSIS — Z79899 Other long term (current) drug therapy: Secondary | ICD-10-CM

## 2015-07-12 LAB — CBC WITH DIFFERENTIAL/PLATELET
Basophils Absolute: 0 10*3/uL (ref 0.0–0.1)
Basophils Relative: 0.4 % (ref 0.0–3.0)
EOS ABS: 0.3 10*3/uL (ref 0.0–0.7)
Eosinophils Relative: 5.2 % — ABNORMAL HIGH (ref 0.0–5.0)
HCT: 42.2 % (ref 39.0–52.0)
HEMOGLOBIN: 14.1 g/dL (ref 13.0–17.0)
Lymphocytes Relative: 36.5 % (ref 12.0–46.0)
Lymphs Abs: 1.8 10*3/uL (ref 0.7–4.0)
MCHC: 33.3 g/dL (ref 30.0–36.0)
MCV: 93.3 fl (ref 78.0–100.0)
MONO ABS: 0.8 10*3/uL (ref 0.1–1.0)
Monocytes Relative: 15.8 % — ABNORMAL HIGH (ref 3.0–12.0)
Neutro Abs: 2.1 10*3/uL (ref 1.4–7.7)
Neutrophils Relative %: 42.1 % — ABNORMAL LOW (ref 43.0–77.0)
Platelets: 224 10*3/uL (ref 150.0–400.0)
RBC: 4.52 Mil/uL (ref 4.22–5.81)
RDW: 12.8 % (ref 11.5–15.5)
WBC: 4.9 10*3/uL (ref 4.0–10.5)

## 2015-07-12 LAB — COMPREHENSIVE METABOLIC PANEL
ALBUMIN: 4.1 g/dL (ref 3.5–5.2)
ALK PHOS: 42 U/L (ref 39–117)
ALT: 26 U/L (ref 0–53)
AST: 23 U/L (ref 0–37)
BUN: 10 mg/dL (ref 6–23)
CALCIUM: 9.6 mg/dL (ref 8.4–10.5)
CO2: 26 mEq/L (ref 19–32)
CREATININE: 0.96 mg/dL (ref 0.40–1.50)
Chloride: 104 mEq/L (ref 96–112)
GFR: 102.31 mL/min (ref >60.00)
Glucose, Bld: 139 mg/dL — ABNORMAL HIGH (ref 70–99)
POTASSIUM: 3.8 meq/L (ref 3.5–5.1)
SODIUM: 140 meq/L (ref 135–145)
TOTAL PROTEIN: 7.9 g/dL (ref 6.0–8.3)
Total Bilirubin: 0.4 mg/dL (ref 0.2–1.2)

## 2015-07-12 MED ORDER — MESALAMINE 1000 MG RE SUPP: 1000.0000 mg | Freq: Every evening | RECTAL | Status: DC | PRN

## 2015-07-12 MED ORDER — MERCAPTOPURINE 50 MG PO TABS: 100.0000 mg | ORAL_TABLET | Freq: Every day | ORAL | Status: DC

## 2015-07-12 MED ORDER — PREDNISONE 10 MG PO TABS
40.0000 mg | ORAL_TABLET | Freq: Every day | ORAL | Status: DC
Start: 2015-07-12 — End: 2015-08-19

## 2015-07-12 NOTE — Progress Notes (Signed)
Quick Note:  Let him know labs are ok Ask him to give me an update on sxs in 3 weeks please (call back) ______

## 2015-07-12 NOTE — Assessment & Plan Note (Signed)
Trying to avoid but situation demands Tx  Starting steroid sparing Tx

## 2015-07-12 NOTE — Progress Notes (Signed)
   Subjective:    Patient ID: Angel Frazier, male    DOB: 1956/02/19, 60 y.o.   MRN: [id number] Chief complaint: Ulcerative colitis HPI Angel Frazier returns for follow-up of his left-sided ulcerative colitis. When last year month or 2 ago he was treated by adding mesalamine suppositories do is 4.8 g of mesalamine daily. It hasn't really helped. His as up to 10 loose stools a day, his having blood and mucus intermittently and persistently overall really. No significant abdominal pain. He is not going to be only go back to work at [employer] because of the weight restrictions on lifting. He is having some shoulder problems as well, there may be some question of whether or not this is a cervical spine and radiculopathy issue. Medications, allergies, past medical history, past surgical history, family history and social history are reviewed and updated in the EMR.   Review of Systems As above    Objective:   Physical Exam @BP  122/90 mmHg  Pulse 88  Ht 5' 9.5" (1.765 m)  Wt 261 lb 6 oz (118.559 kg)  BMI 38.06 kg/m2@  General:  NAD Eyes:   anicteric Lungs:  clear Heart:: S1S2 no rubs, murmurs or gallops Abdomen:  soft and nontender, BS+  Data Reviewed:  As above     Assessment & Plan:  LEFT SIDED ULCERATIVE COLITIS Prednisone taper 40 mg qd x 1 week then 30 mg qd x 1 week then 20 mg qd and reassess by phoneand 6 MP - 100 mg daily RTC 2 months Full discussion re: risks 6 MP Major problem of losing insurance looms...  Long term current use of systemic steroids Trying to avoid but situation demands Tx  Starting steroid sparing Tx  Long-term use of immunosuppressant medication - 6 MP Starting 6 MP, risks benefits and need for lab monitoring discussed

## 2015-07-12 NOTE — Patient Instructions (Addendum)
  Your physician has requested that you go to the basement for the following lab work before leaving today: CBC/diff, CMET  Come back in a month and get labs drawn to check your CBC/diff and LFT's.  No appointment needed, they open 7:30AM-5:30PM Monday - Friday.   We have sent the following prescriptions to your mail in pharmacy: 6MP  If you have not heard from your mail in pharmacy within 1 week or if you have not received your medication in the mail, please contact us at [phone] so we may find out why.   We have sent the following medications to your pharmacy for you to pick up at your convenience: Prednisone  Please use your rectal suppositories just as needed.   Follow up with [place] in 2 months.    I appreciate the opportunity to care for you. Silvano Rusk, MD, [facility]

## 2015-07-12 NOTE — Assessment & Plan Note (Addendum)
Starting 6 MP, risks benefits and need for lab monitoring discussed

## 2015-07-12 NOTE — Assessment & Plan Note (Addendum)
Prednisone taper 40 mg qd x 1 week then 30 mg qd x 1 week then 20 mg qd and reassess by phoneand 6 MP - 100 mg daily RTC 2 months Full discussion re: risks 6 MP Major problem of losing insurance looms.Marland KitchenMarland Kitchen

## 2015-08-09 ENCOUNTER — Other Ambulatory Visit (INDEPENDENT_AMBULATORY_CARE_PROVIDER_SITE_OTHER): Payer: BLUE CROSS/BLUE SHIELD

## 2015-08-09 DIAGNOSIS — K5150 Left sided colitis without complications: Secondary | ICD-10-CM | POA: Diagnosis not present

## 2015-08-09 DIAGNOSIS — Z7952 Long term (current) use of systemic steroids: Secondary | ICD-10-CM | POA: Diagnosis not present

## 2015-08-09 DIAGNOSIS — Z79899 Other long term (current) drug therapy: Secondary | ICD-10-CM

## 2015-08-09 DIAGNOSIS — Z7960 Long term (current) use of unspecified immunomodulators and immunosuppressants: Secondary | ICD-10-CM

## 2015-08-09 LAB — CBC WITH DIFFERENTIAL/PLATELET
BASOS ABS: 0 10*3/uL (ref 0.0–0.1)
Basophils Relative: 0.4 % (ref 0.0–3.0)
EOS ABS: 0.1 10*3/uL (ref 0.0–0.7)
Eosinophils Relative: 2.5 % (ref 0.0–5.0)
HCT: 40.1 % (ref 39.0–52.0)
Hemoglobin: 13.7 g/dL (ref 13.0–17.0)
LYMPHS ABS: 1.7 10*3/uL (ref 0.7–4.0)
Lymphocytes Relative: 35.1 % (ref 12.0–46.0)
MCHC: 34.2 g/dL (ref 30.0–36.0)
MCV: 91 fl (ref 78.0–100.0)
MONO ABS: 0.5 10*3/uL (ref 0.1–1.0)
MONOS PCT: 9.3 % (ref 3.0–12.0)
NEUTROS ABS: 2.6 10*3/uL (ref 1.4–7.7)
NEUTROS PCT: 52.7 % (ref 43.0–77.0)
PLATELETS: 249 10*3/uL (ref 150.0–400.0)
RBC: 4.41 Mil/uL (ref 4.22–5.81)
RDW: 12.2 % (ref 11.5–15.5)
WBC: 4.9 10*3/uL (ref 4.0–10.5)

## 2015-08-09 LAB — HEPATIC FUNCTION PANEL
ALBUMIN: 4.2 g/dL (ref 3.5–5.2)
ALK PHOS: 39 U/L (ref 39–117)
ALT: 19 U/L (ref 0–53)
AST: 14 U/L (ref 0–37)
BILIRUBIN TOTAL: 0.8 mg/dL (ref 0.2–1.2)
Bilirubin, Direct: 0.2 mg/dL (ref 0.0–0.3)
Total Protein: 7.6 g/dL (ref 6.0–8.3)

## 2015-08-10 NOTE — Progress Notes (Signed)
Quick Note:  Labs ok Please get an update re: sxs and current dose of prednisone ______

## 2015-08-17 ENCOUNTER — Telehealth: Payer: Self-pay | Admitting: Internal Medicine

## 2015-08-17 DIAGNOSIS — Z79899 Other long term (current) drug therapy: Secondary | ICD-10-CM

## 2015-08-17 DIAGNOSIS — Z7960 Long term (current) use of unspecified immunomodulators and immunosuppressants: Secondary | ICD-10-CM

## 2015-08-17 NOTE — Telephone Encounter (Signed)
Patient reports very thirsty on Purinetho.  He reports that he has a decline in his appetite, he is dizzy, and generally feels bad.  Dr. Carlean Purl please advise

## 2015-08-17 NOTE — Telephone Encounter (Signed)
Hold it CBC, CMET, amylase and lipase please

## 2015-08-17 NOTE — Telephone Encounter (Signed)
Patient notified  He will come for labs tomorrow.

## 2015-08-18 ENCOUNTER — Other Ambulatory Visit (INDEPENDENT_AMBULATORY_CARE_PROVIDER_SITE_OTHER): Payer: BLUE CROSS/BLUE SHIELD

## 2015-08-18 DIAGNOSIS — Z79899 Other long term (current) drug therapy: Secondary | ICD-10-CM

## 2015-08-18 DIAGNOSIS — Z7960 Long term (current) use of unspecified immunomodulators and immunosuppressants: Secondary | ICD-10-CM

## 2015-08-18 LAB — COMPREHENSIVE METABOLIC PANEL
ALBUMIN: 4.3 g/dL (ref 3.5–5.2)
ALK PHOS: 49 U/L (ref 39–117)
ALT: 14 U/L (ref 0–53)
AST: 11 U/L (ref 0–37)
BUN: 22 mg/dL (ref 6–23)
CALCIUM: 10.1 mg/dL (ref 8.4–10.5)
CHLORIDE: 92 meq/L — AB (ref 96–112)
CO2: 15 mEq/L — ABNORMAL LOW (ref 19–32)
Creatinine, Ser: 1.37 mg/dL (ref 0.40–1.50)
GFR: 67.85 mL/min (ref >60.00)
Glucose, Bld: 419 mg/dL — ABNORMAL HIGH (ref 70–99)
POTASSIUM: 4.4 meq/L (ref 3.5–5.1)
SODIUM: 128 meq/L — AB (ref 135–145)
TOTAL PROTEIN: 8.6 g/dL — AB (ref 6.0–8.3)
Total Bilirubin: 0.7 mg/dL (ref 0.2–1.2)

## 2015-08-18 LAB — CBC WITH DIFFERENTIAL/PLATELET
BASOS PCT: 0.4 % (ref 0.0–3.0)
Basophils Absolute: 0 10*3/uL (ref 0.0–0.1)
EOS PCT: 1.8 % (ref 0.0–5.0)
Eosinophils Absolute: 0.1 10*3/uL (ref 0.0–0.7)
HEMATOCRIT: 43.1 % (ref 39.0–52.0)
HEMOGLOBIN: 14.6 g/dL (ref 13.0–17.0)
LYMPHS PCT: 32 % (ref 12.0–46.0)
Lymphs Abs: 1.5 10*3/uL (ref 0.7–4.0)
MCHC: 34 g/dL (ref 30.0–36.0)
MCV: 91 fl (ref 78.0–100.0)
MONO ABS: 0.4 10*3/uL (ref 0.1–1.0)
MONOS PCT: 8.5 % (ref 3.0–12.0)
Neutro Abs: 2.7 10*3/uL (ref 1.4–7.7)
Neutrophils Relative %: 57.3 % (ref 43.0–77.0)
Platelets: 374 10*3/uL (ref 150.0–400.0)
RBC: 4.73 Mil/uL (ref 4.22–5.81)
RDW: 12.3 % (ref 11.5–15.5)
WBC: 4.7 10*3/uL (ref 4.0–10.5)

## 2015-08-18 LAB — LIPASE: LIPASE: 347 U/L — AB (ref 11.0–59.0)

## 2015-08-18 LAB — AMYLASE: AMYLASE: 172 U/L — AB (ref 27–131)

## 2015-08-18 NOTE — Progress Notes (Signed)
Quick Note:  See other norte - also once blood sugars addressed I will arange f/u with [place] re UC and the amylase/lipase elevation ______

## 2015-08-18 NOTE — Progress Notes (Signed)
Quick Note:  His blood sugar is very high - probably from prednisone - where is he on the dose? If at 78 go to 10  May have some pancreatitis also but I think most likely problems stemming from the elevated glucose - he needs to go to PCP office and be worked in today/tomorrow re severe hyperglycemia/diabetes  Hold 6 MP - we may use again but right now hold  If he feels really bad then go to ED -    ______

## 2015-08-19 ENCOUNTER — Inpatient Hospital Stay (HOSPITAL_COMMUNITY)
Admission: EM | Admit: 2015-08-19 | Discharge: 2015-08-24 | DRG: 638 | Disposition: A | Discharge status: Home / Self Care | Payer: BLUE CROSS/BLUE SHIELD | Attending: Family Medicine | Admitting: Family Medicine

## 2015-08-19 ENCOUNTER — Emergency Department (HOSPITAL_COMMUNITY): Payer: BLUE CROSS/BLUE SHIELD

## 2015-08-19 ENCOUNTER — Encounter (HOSPITAL_COMMUNITY): Payer: Self-pay | Admitting: Nurse Practitioner

## 2015-08-19 ENCOUNTER — Inpatient Hospital Stay (HOSPITAL_COMMUNITY): Payer: BLUE CROSS/BLUE SHIELD

## 2015-08-19 ENCOUNTER — Ambulatory Visit (INDEPENDENT_AMBULATORY_CARE_PROVIDER_SITE_OTHER): Payer: BLUE CROSS/BLUE SHIELD | Admitting: Family Medicine

## 2015-08-19 ENCOUNTER — Encounter: Payer: Self-pay | Admitting: Family Medicine

## 2015-08-19 VITALS — BP 90/70 | HR 107 | Temp 97.5°F | Resp 20 | Ht 69.5 in | Wt 228.0 lb

## 2015-08-19 DIAGNOSIS — E1310 Other specified diabetes mellitus with ketoacidosis without coma: Secondary | ICD-10-CM | POA: Diagnosis present

## 2015-08-19 DIAGNOSIS — R739 Hyperglycemia, unspecified: Secondary | ICD-10-CM | POA: Diagnosis not present

## 2015-08-19 DIAGNOSIS — K5190 Ulcerative colitis, unspecified, without complications: Secondary | ICD-10-CM | POA: Diagnosis present

## 2015-08-19 DIAGNOSIS — M4806 Spinal stenosis, lumbar region: Secondary | ICD-10-CM | POA: Diagnosis present

## 2015-08-19 DIAGNOSIS — I959 Hypotension, unspecified: Secondary | ICD-10-CM | POA: Diagnosis not present

## 2015-08-19 DIAGNOSIS — K859 Acute pancreatitis without necrosis or infection, unspecified: Secondary | ICD-10-CM

## 2015-08-19 DIAGNOSIS — R7309 Other abnormal glucose: Secondary | ICD-10-CM | POA: Diagnosis not present

## 2015-08-19 DIAGNOSIS — N179 Acute kidney failure, unspecified: Secondary | ICD-10-CM | POA: Diagnosis present

## 2015-08-19 DIAGNOSIS — Z6832 Body mass index (BMI) 32.0-32.9, adult: Secondary | ICD-10-CM

## 2015-08-19 DIAGNOSIS — E860 Dehydration: Secondary | ICD-10-CM

## 2015-08-19 DIAGNOSIS — K5150 Left sided colitis without complications: Secondary | ICD-10-CM

## 2015-08-19 DIAGNOSIS — Z87891 Personal history of nicotine dependence: Secondary | ICD-10-CM | POA: Diagnosis not present

## 2015-08-19 DIAGNOSIS — Z7952 Long term (current) use of systemic steroids: Secondary | ICD-10-CM

## 2015-08-19 DIAGNOSIS — E669 Obesity, unspecified: Secondary | ICD-10-CM | POA: Diagnosis present

## 2015-08-19 DIAGNOSIS — Z794 Long term (current) use of insulin: Secondary | ICD-10-CM | POA: Diagnosis not present

## 2015-08-19 DIAGNOSIS — R631 Polydipsia: Secondary | ICD-10-CM | POA: Diagnosis present

## 2015-08-19 DIAGNOSIS — I444 Left anterior fascicular block: Secondary | ICD-10-CM | POA: Diagnosis present

## 2015-08-19 DIAGNOSIS — M1990 Unspecified osteoarthritis, unspecified site: Secondary | ICD-10-CM | POA: Diagnosis present

## 2015-08-19 DIAGNOSIS — I10 Essential (primary) hypertension: Secondary | ICD-10-CM | POA: Diagnosis present

## 2015-08-19 DIAGNOSIS — E1110 Type 2 diabetes mellitus with ketoacidosis without coma: Secondary | ICD-10-CM | POA: Insufficient documentation

## 2015-08-19 DIAGNOSIS — K5100 Ulcerative (chronic) pancolitis without complications: Secondary | ICD-10-CM | POA: Diagnosis present

## 2015-08-19 DIAGNOSIS — Z7982 Long term (current) use of aspirin: Secondary | ICD-10-CM | POA: Diagnosis not present

## 2015-08-19 LAB — BASIC METABOLIC PANEL
ANION GAP: 21 — AB (ref 5–15)
BUN: 19 mg/dL (ref 6–20)
CALCIUM: 10 mg/dL (ref 8.9–10.3)
CO2: 14 mmol/L — AB (ref 22–32)
CREATININE: 1.63 mg/dL — AB (ref 0.61–1.24)
Chloride: 95 mmol/L — ABNORMAL LOW (ref 101–111)
GFR calc Af Amer: 51 mL/min — ABNORMAL LOW (ref >60)
GFR, EST NON AFRICAN AMERICAN: 44 mL/min — AB (ref >60)
GLUCOSE: 486 mg/dL — AB (ref 65–99)
Potassium: 5 mmol/L (ref 3.5–5.1)
Sodium: 130 mmol/L — ABNORMAL LOW (ref 135–145)

## 2015-08-19 LAB — CBG MONITORING, ED
GLUCOSE-CAPILLARY: 440 mg/dL — AB (ref 65–99)
GLUCOSE-CAPILLARY: 419 mg/dL — AB (ref 65–99)
GLUCOSE-CAPILLARY: 354 mg/dL — AB (ref 65–99)
Glucose-Capillary: 381 mg/dL — ABNORMAL HIGH (ref 65–99)

## 2015-08-19 LAB — URINALYSIS, ROUTINE W REFLEX MICROSCOPIC
Glucose, UA: >1000 mg/dL — AB
HGB URINE DIPSTICK: NEGATIVE
Leukocytes, UA: NEGATIVE
NITRITE: NEGATIVE
Protein, ur: NEGATIVE mg/dL
Specific Gravity, Urine: 1.033 — ABNORMAL HIGH (ref 1.005–1.030)
pH: 5.5 (ref 5.0–8.0)

## 2015-08-19 LAB — URINE MICROSCOPIC-ADD ON: WBC UA: NONE SEEN WBC/hpf (ref 0–5)

## 2015-08-19 LAB — SALICYLATE LEVEL: Salicylate Lvl: <4 mg/dL (ref 2.8–30.0)

## 2015-08-19 LAB — CBC
HCT: 42.5 % (ref 39.0–52.0)
HEMOGLOBIN: 14.8 g/dL (ref 13.0–17.0)
MCH: 31.2 pg (ref 26.0–34.0)
MCHC: 34.8 g/dL (ref 30.0–36.0)
MCV: 89.5 fL (ref 78.0–100.0)
Platelets: 332 10*3/uL (ref 150–400)
RBC: 4.75 MIL/uL (ref 4.22–5.81)
RDW: 11.9 % (ref 11.5–15.5)
WBC: 3.4 10*3/uL — ABNORMAL LOW (ref 4.0–10.5)

## 2015-08-19 LAB — I-STAT VENOUS BLOOD GAS, ED
ACID-BASE DEFICIT: 15 mmol/L — AB (ref 0.0–2.0)
Bicarbonate: 12.1 mEq/L — ABNORMAL LOW (ref 20.0–24.0)
O2 SAT: 24 %
PCO2 VEN: 33.1 mmHg — AB (ref 45.0–50.0)
PH VEN: 7.17 — AB (ref 7.250–7.300)
TCO2: 13 mmol/L (ref 0–100)
pO2, Ven: 21 mmHg — ABNORMAL LOW (ref 31.0–45.0)

## 2015-08-19 LAB — HEPATIC FUNCTION PANEL
ALK PHOS: 43 U/L (ref 38–126)
ALT: 15 U/L — AB (ref 17–63)
AST: 20 U/L (ref 15–41)
Albumin: 3.4 g/dL — ABNORMAL LOW (ref 3.5–5.0)
BILIRUBIN DIRECT: 0.4 mg/dL (ref 0.1–0.5)
BILIRUBIN INDIRECT: 1.6 mg/dL — AB (ref 0.3–0.9)
Total Bilirubin: 2 mg/dL — ABNORMAL HIGH (ref 0.3–1.2)
Total Protein: 7.4 g/dL (ref 6.5–8.1)

## 2015-08-19 LAB — I-STAT TROPONIN, ED: Troponin i, poc: 0.01 ng/mL (ref 0.00–0.08)

## 2015-08-19 LAB — POCT GLYCOSYLATED HEMOGLOBIN (HGB A1C): HEMOGLOBIN A1C: 427

## 2015-08-19 LAB — LIPASE, BLOOD: Lipase: 136 U/L — ABNORMAL HIGH (ref 11–51)

## 2015-08-19 LAB — BETA-HYDROXYBUTYRIC ACID: Beta-Hydroxybutyric Acid: >8 mmol/L — ABNORMAL HIGH (ref 0.05–0.27)

## 2015-08-19 MED ORDER — DEXTROSE 50 % IV SOLN: 25.0000 mL | INTRAVENOUS | Status: DC | PRN

## 2015-08-19 MED ORDER — ALBUTEROL SULFATE (2.5 MG/3ML) 0.083% IN NEBU: 2.5000 mg | INHALATION_SOLUTION | RESPIRATORY_TRACT | Status: DC | PRN

## 2015-08-19 MED ORDER — ONDANSETRON HCL 4 MG/2ML IJ SOLN: 4.0000 mg | Freq: Three times a day (TID) | INTRAMUSCULAR | Status: DC | PRN

## 2015-08-19 MED ORDER — INSULIN REGULAR BOLUS VIA INFUSION
0.0000 [IU] | Freq: Three times a day (TID) | INTRAVENOUS | Status: DC
  Administered 2015-08-20: 7.3 [IU] via INTRAVENOUS
  Administered 2015-08-20: 6.7 [IU] via INTRAVENOUS
  Administered 2015-08-20: 5.1 [IU] via INTRAVENOUS
  Administered 2015-08-21: 6.4 [IU] via INTRAVENOUS
  Administered 2015-08-21: 5.5 [IU] via INTRAVENOUS
  Filled 2015-08-19: qty 10

## 2015-08-19 MED ORDER — DEXTROSE-NACL 5-0.45 % IV SOLN: INTRAVENOUS | Status: DC

## 2015-08-19 MED ORDER — INSULIN REGULAR HUMAN 100 UNIT/ML IJ SOLN
INTRAMUSCULAR | Status: DC
  Administered 2015-08-19: 3.6 [IU]/h via INTRAVENOUS
  Administered 2015-08-20: 8.3 [IU]/h via INTRAVENOUS
  Administered 2015-08-20: 15.1 [IU]/h via INTRAVENOUS
  Administered 2015-08-20: 10.9 [IU]/h via INTRAVENOUS
  Administered 2015-08-20: 12.8 [IU]/h via INTRAVENOUS
  Administered 2015-08-20: 8.2 [IU]/h via INTRAVENOUS
  Administered 2015-08-20: 17.4 [IU]/h via INTRAVENOUS
  Administered 2015-08-20: 14.2 [IU]/h via INTRAVENOUS
  Administered 2015-08-20: 19.7 [IU]/h via INTRAVENOUS
  Administered 2015-08-21: 27.1 [IU]/h via INTRAVENOUS
  Administered 2015-08-21: 8.8 [IU]/h via INTRAVENOUS
  Administered 2015-08-21: 5.1 [IU]/h via INTRAVENOUS
  Administered 2015-08-21: 4.1 [IU]/h via INTRAVENOUS
  Administered 2015-08-21: 7.5 [IU]/h via INTRAVENOUS

## 2015-08-19 MED ORDER — SODIUM CHLORIDE 0.9 % IV SOLN
INTRAVENOUS | Status: DC
  Filled 2015-08-19: qty 2.5

## 2015-08-19 MED ORDER — ONDANSETRON HCL 4 MG PO TABS: 4.0000 mg | ORAL_TABLET | Freq: Four times a day (QID) | ORAL | Status: DC | PRN

## 2015-08-19 MED ORDER — VITAMIN C 500 MG PO TABS: 1000.0000 mg | ORAL_TABLET | Freq: Every day | ORAL | Status: DC

## 2015-08-19 MED ORDER — ACETAMINOPHEN 650 MG RE SUPP: 650.0000 mg | Freq: Four times a day (QID) | RECTAL | Status: DC | PRN

## 2015-08-19 MED ORDER — OXYCODONE-ACETAMINOPHEN 5-325 MG PO TABS: 2.0000 | ORAL_TABLET | ORAL | Status: DC | PRN

## 2015-08-19 MED ORDER — VITAMIN B-12 1000 MCG PO TABS: 2500.0000 ug | ORAL_TABLET | Freq: Every day | ORAL | Status: DC

## 2015-08-19 MED ORDER — ENOXAPARIN SODIUM 40 MG/0.4ML ~~LOC~~ SOLN
40.0000 mg | Freq: Every day | SUBCUTANEOUS | Status: DC
Start: 2015-08-20 — End: 2015-08-24

## 2015-08-19 MED ORDER — ADULT MULTIVITAMIN W/MINERALS CH: 1.0000 | ORAL_TABLET | Freq: Every day | ORAL | Status: DC

## 2015-08-19 MED ORDER — METHOCARBAMOL 500 MG PO TABS: 500.0000 mg | ORAL_TABLET | Freq: Every day | ORAL | Status: DC | PRN

## 2015-08-19 MED ORDER — SODIUM CHLORIDE 0.9 % IV SOLN: INTRAVENOUS | Status: DC

## 2015-08-19 MED ORDER — DEXTROSE-NACL 5-0.45 % IV SOLN
INTRAVENOUS | Status: AC
  Administered 2015-08-20 (×4): via INTRAVENOUS

## 2015-08-19 MED ORDER — ASPIRIN EC 81 MG PO TBEC: 81.0000 mg | DELAYED_RELEASE_TABLET | Freq: Every day | ORAL | Status: DC

## 2015-08-19 MED ORDER — ACETAMINOPHEN 325 MG PO TABS: 650.0000 mg | ORAL_TABLET | Freq: Four times a day (QID) | ORAL | Status: DC | PRN

## 2015-08-19 MED ORDER — IOHEXOL 300 MG/ML  SOLN
80.0000 mL | Freq: Once | INTRAMUSCULAR | Status: AC | PRN
  Administered 2015-08-19: 80 mL via INTRAVENOUS

## 2015-08-19 MED ORDER — MERCAPTOPURINE 50 MG PO TABS
50.0000 mg | ORAL_TABLET | Freq: Every day | ORAL | Status: DC
  Filled 2015-08-19 (×9): qty 1

## 2015-08-19 MED ORDER — ONDANSETRON HCL 4 MG/2ML IJ SOLN: 4.0000 mg | Freq: Four times a day (QID) | INTRAMUSCULAR | Status: DC | PRN

## 2015-08-19 MED ORDER — SODIUM CHLORIDE 0.9% FLUSH
3.0000 mL | Freq: Two times a day (BID) | INTRAVENOUS | Status: DC
  Administered 2015-08-20 – 2015-08-23 (×7): 3 mL via INTRAVENOUS

## 2015-08-19 MED ORDER — SODIUM CHLORIDE 0.9 % IV BOLUS (SEPSIS)
2000.0000 mL | Freq: Once | INTRAVENOUS | Status: AC
  Administered 2015-08-19: 2000 mL via INTRAVENOUS

## 2015-08-19 MED ORDER — OMEGA-3-ACID ETHYL ESTERS 1 G PO CAPS: 1.0000 g | ORAL_CAPSULE | Freq: Every day | ORAL | Status: DC

## 2015-08-19 MED ORDER — CYCLOBENZAPRINE HCL 10 MG PO TABS: 10.0000 mg | ORAL_TABLET | Freq: Three times a day (TID) | ORAL | Status: DC | PRN

## 2015-08-19 NOTE — ED Notes (Signed)
Pt c/o several day history of increased thirst, poor appetite, and malaise. He went to his PCP today and they found his blood glucose >400 and sent to hospital for possible admission. He denies any pain. hes a&ox4, resp e/u

## 2015-08-19 NOTE — ED Notes (Signed)
Checked patient cbg it was 440 notified RN blood sugar

## 2015-08-19 NOTE — Patient Instructions (Signed)
Go straight to the emergency room at [facility].   BP 90/70 mmHg  Pulse 107  Temp(Src) 97.5 F (36.4 C) (Oral)  Resp 20  Ht 5' 9.5" (1.765 m)  Wt 228 lb (103.42 kg)  BMI 33.20 kg/m2  SpO2 98% You are dehydrated your sugar is over 400, your pulse is over 100, your blood pressure is low. You need fluids, and insulin, and monitoring. You may need overnight admission depending on how things go  I am happy to have you as a patient and I am willing to do your hospital follow up or ED follow up

## 2015-08-19 NOTE — Progress Notes (Signed)
Pre visit review using our clinic review tool, if applicable. No additional management support is needed unless otherwise documented below in the visit note. 

## 2015-08-19 NOTE — Progress Notes (Signed)
Angel Reddish, MD  Subjective:  Angel Frazier is a 60 y.o. year old very pleasant male patient who presents for/with See problem oriented charting ROS- no fever, chills. No diarrhea. Frequent urination, excess thirst.   Past Medical History- Ulcerative colitis on immunosuppressives in past (off at this time)  Medications- reviewed and updated Current Outpatient Prescriptions  Medication Sig Dispense Refill  . Ascorbic Acid (VITAMIN C) 1000 MG tablet Take 1,000 mg by mouth daily.    Marland Kitchen aspirin 81 MG tablet Take 81 mg by mouth daily.      . Calcium Carbonate-Vitamin D (CALCIUM-VITAMIN D3 PO) Take 1 tablet by mouth daily. Calcium 1200 vit d3 plus    . Cholecalciferol (VITAMIN D3) 50000 UNITS CAPS Take 1 tablet by mouth once a week. 6 capsule 0  . cyclobenzaprine (FLEXERIL) 10 MG tablet Take 10 mg by mouth 3 (three) times daily as needed for muscle spasms.    . mesalamine (CANASA) 1000 MG suppository Place 1 suppository (1,000 mg total) rectally at bedtime as needed. 30 suppository 12  . METHOCARBAMOL PO Take by mouth.    . Multiple Vitamins-Minerals (CENTRUM SILVER PO) Take 1 tablet by mouth daily.    . Omega-3 Fatty Acids (FISH OIL) 300 MG CAPS Take 2 capsules by mouth daily.      Marland Kitchen oxyCODONE-acetaminophen (PERCOCET/ROXICET) 5-325 MG tablet Take 2 tablets by mouth every 4 (four) hours as needed for severe pain. 20 tablet 0  . vitamin B-12 (CYANOCOBALAMIN) 1000 MCG tablet Take 2,500 mcg by mouth daily.      No current facility-administered medications for this visit.    Objective: BP 90/70 mmHg  Pulse 107  Temp(Src) 97.5 F (36.4 C) (Oral)  Resp 20  Ht 5' 9.5" (1.765 m)  Wt 228 lb (103.42 kg)  BMI 33.20 kg/m2  SpO2 98% Gen: NAD, appears very fatigued- hunched over on table Dry mucus membranes despite drinking a full bottle of water in my presenece CV: tachycardic, no no murmurs rubs or gallops Lungs: CTAB no crackles, wheeze, rhonchi Abdomen: soft/nontender/nondistended/normal  bowel sounds.  Ext: no edema Skin: warm, dry, no rash Neuro: grossly normal, moves all extremities, is able to slowly walk  Assessment/Plan:  Hyperglycemia- likely new onset diabetes even if prednisone induced Dehydration Hypotension S: Patient called into GI yesterday reporting polyuria on purinetho as well as nausea, dizziness, feeling overall bad. Patient had amylase and lipase which were both eleveted. More concerning was CBG over 400 in nondiabetic. He had been on prednisone but this was stopped a week ago. He is an ulcerative colitis patient of Dr. Carlean Purl  Today, patient reports drinking 1.5 gallons a day and remaining thirsty. He is very fatigued, nauseous all the time. CBG 422 today without eating.   A/P: Hyperglycemia- likely new onset diabetes even if prednisone induced Offered insulin in office and observation for several hours.  We are not equiped for IV fluids. If weekday could consider 4 hours novolog followed by lantus overnight and see how he does- He feels so poorly today he would like more immediate improvement Dehydration and Hypotension (BP 90/70 and HR 107) Will need IVF in ED ? Pancreatitis  No abdominal pain but is very nauseous. Suspect bowel rest and IVF would help him.   Wife will drive him to [name] . I will call to see if he can be brought back quicker.   Patient's PCP only working 2.5 days a week at this piont- patient asks me to assume care and I  am willing to do so for hostpital follow up

## 2015-08-19 NOTE — H&P (Addendum)
[facility] History and Physical  Angel Frazier ZOX:[id number] DOB: 09/30/1955 DOA: 08/19/2015  Referring physician:  ED PCP: [name], MD   Chief Complaint: Generalized weakness and elevated blood sugar  HPI:  Mr. Helseth is a 60 year old male with a past medical history significant for ulcerative colitis, chronic steroid use, osteoarthritis, and hypertension; who presents with complaints of generalized weakness after being advise by his primary care provider for elevated blood sugars. Patient complains of a 2 week history of generalized weakness. He's had associated symptoms of a 30 pound weight loss, shortness of breath, nausea, loss of appetite, urinary frequency. He complains of polydipsia, dry mouth, nocturia. Denies any episodes of vomiting, chest pain, fever, or chills. Patient has never had high blood sugars before in the past to his knowledge. He does endorse a family history of his son being a type I diabetic. He just had recently had a flare of his ulcerative colitis for which he was just tapered off of steroids. He sees Dr. Carlean Purl of gastroenterology and he reports recently been started on mercaptopurine last month. His wife notes that he does have a diet includes items that have a lot of sugar in them.  Upon admission patient was seen have elevated blood glucose of 486, anion gap 21, CO2 14, Betty hydroxybutyric level greater than 8. UA was positive for ketones and glucose. Venous pH was 7.17. Patient's initial chest x-ray showed no acute abnormalities. Patient was started on a glucose nitroglycerin in the ED.   Review of Systems  Constitutional: Positive for malaise/fatigue. Negative for diaphoresis.  HENT: Negative for ear pain and tinnitus.   Eyes: Negative for photophobia and pain.  Respiratory: Positive for shortness of breath. Negative for cough.   Cardiovascular: Negative for chest pain, palpitations and leg swelling.  Gastrointestinal: Positive for nausea. Negative  for vomiting, constipation and blood in stool.  Genitourinary: Positive for frequency. Negative for dysuria and flank pain.  Musculoskeletal: Positive for joint pain. Negative for falls.  Skin: Negative for itching and rash.  Neurological: Positive for weakness. Negative for sensory change and speech change.  Endo/Heme/Allergies: Positive for polydipsia. Negative for environmental allergies.  Psychiatric/Behavioral: Negative for substance abuse. The patient does not have insomnia.       Past Medical History  Diagnosis Date  . Erectile dysfunction   . Left sided ulcerative colitis ([place])   . Hypertension   . Bleeding hemorrhoids   . Anabolic steroid abuse   .  vitamin D deficiency 01/24/2013    01/23/13 - level 29 - supplements 50k IU weekly x 6 recommended   . Avascular necrosis of femoral head ([place])     bilateral  . Spinal stenosis of lumbar region at multiple levels      Past Surgical History  Procedure Laterality Date  . Knee arthroscopy Right   . Colonoscopy      multiple  . Sigmoidoscopy    . Hemorrhoid banding  2012    With sigmoidoscopy      Social History:  reports that he has quit smoking. He has never used smokeless tobacco. He reports that he drinks about 0.5 oz of alcohol per week. He reports that he does not use illicit drugs. Where does patient live--home  and with whom if at home? Wife Can patient participate in ADLs?Yes   No Known Allergies  Family History  Problem Relation Age of Onset  . Diabetes      cousin  . Hypertension    . Colon cancer  Neg Hx   . Diabetes Mother        Prior to Admission medications   Medication Sig Start Date End Date Taking? Authorizing Provider  Ascorbic Acid (VITAMIN C) 1000 MG tablet Take 1,000 mg by mouth daily.   Yes Historical Provider, MD  aspirin 81 MG tablet Take 81 mg by mouth daily.     Yes Historical Provider, MD  Calcium Carbonate-Vitamin D (CALCIUM-VITAMIN D3 PO) Take 1 tablet by mouth daily. Calcium 1200  vit d3 plus   Yes Historical Provider, MD  Cholecalciferol (VITAMIN D3) 50000 UNITS CAPS Take 1 tablet by mouth once a week. Patient taking differently: Take 1 tablet by mouth every Monday.  01/26/13  Yes Gatha Mayer, MD  cyclobenzaprine (FLEXERIL) 10 MG tablet Take 10 mg by mouth 3 (three) times daily as needed for muscle spasms.   Yes Historical Provider, MD  mesalamine (CANASA) 1000 MG suppository Place 1 suppository (1,000 mg total) rectally at bedtime as needed. 07/12/15  Yes Gatha Mayer, MD  METHOCARBAMOL PO Take 500 mg by mouth daily as needed (for muscle spasms).    Yes Historical Provider, MD  Multiple Vitamins-Minerals (CENTRUM SILVER PO) Take 1 tablet by mouth daily.   Yes Historical Provider, MD  Omega-3 Fatty Acids (FISH OIL) 300 MG CAPS Take 300 mg by mouth daily.    Yes Historical Provider, MD  oxyCODONE-acetaminophen (PERCOCET/ROXICET) 5-325 MG tablet Take 2 tablets by mouth every 4 (four) hours as needed for severe pain. 06/14/15  Yes Hollace Kinnier Sofia, PA-C  vitamin B-12 (CYANOCOBALAMIN) 1000 MCG tablet Take 2,500 mcg by mouth daily.    Yes Historical Provider, MD     Physical Exam: Filed Vitals:   08/19/15 1830 08/19/15 1900 08/19/15 1915 08/19/15 1930  BP: 121/90 126/95 126/89 136/97  Pulse: 89 95 89 92  Temp:      TempSrc:      Resp: 17 18 17 21   SpO2: 99% 99% 99% 99%     Constitutional: Vital signs reviewed. Patient is a well-developed and well-nourished in no acute distress and cooperative with exam. Alert and oriented x3.  Head: Normocephalic and atraumatic  Ear: TM normal bilaterally  Mouth: no erythema or exudates, dry mucous membranes Eyes: PERRL, EOMI, conjunctivae normal, No scleral icterus.  Neck: Supple, Trachea midline normal ROM, No JVD, mass, thyromegaly, or carotid bruit present.  Cardiovascular: RRR, S1 normal, S2 normal, no MRG, pulses symmetric and intact bilaterally  Pulmonary/Chest: CTAB, no wheezes, rales, or rhonchi  Abdominal: Soft.  Non-tender, non-distended, bowel sounds are normal, no masses, organomegaly, or guarding present.  GU: no CVA tenderness Musculoskeletal: No joint deformities, erythema, or stiffness, ROM full and no nontender Ext: no edema and no cyanosis, pulses palpable bilaterally (DP and PT)  Hematology: no cervical, inginal, or axillary adenopathy.  Neurological: A&O x3,Generalized weakness strength is 4+ out of 5., cranial nerve II-XII are grossly intact, no focal motor deficit, sensory intact to light touch bilaterally.  Skin: Warm, dry and intact. No rash, cyanosis, or clubbing.  Psychiatric: Normal mood and affect. speech and behavior is normal. Judgment and thought content normal. Cognition and memory are normal.      Data Review   Micro Results No results found for this or any previous visit (from the past 240 hour(s)).  Radiology Reports Dg Chest 2 View  08/19/2015  CLINICAL DATA:  Loss of appetite EXAM: CHEST  2 VIEW COMPARISON:  01/16/2010 FINDINGS: The heart size and mediastinal contours are within normal limits.  Both lungs are clear. The visualized skeletal structures are unremarkable. IMPRESSION: No active cardiopulmonary disease. Electronically Signed   By: Inez Catalina M.D.   On: 08/19/2015 18:05     CBC  Recent Labs Lab 08/18/15 1056 08/19/15 1326  WBC 4.7 3.4*  HGB 14.6 14.8  HCT 43.1 42.5  PLT 374.0 332  MCV 91.0 89.5  MCH  --  31.2  MCHC 34.0 34.8  RDW 12.3 11.9  LYMPHSABS 1.5  --   MONOABS 0.4  --   EOSABS 0.1  --   BASOSABS 0.0  --     Chemistries   Recent Labs Lab 08/18/15 1056 08/19/15 1326 08/19/15 1857  NA 128* 130*  --   K 4.4 5.0  --   CL 92* 95*  --   CO2 15* 14*  --   GLUCOSE 419* 486*  --   BUN 22 19  --   CREATININE 1.37 1.63*  --   CALCIUM 10.1 10.0  --   AST 11  --  20  ALT 14  --  15*  ALKPHOS 49  --  43  BILITOT 0.7  --  2.0*    ------------------------------------------------------------------------------------------------------------------ estimated creatinine clearance is 56.9 mL/min (by C-G formula based on Cr of 1.63). ------------------------------------------------------------------------------------------------------------------  Recent Labs  08/19/15 1136  HGBA1C 427   ------------------------------------------------------------------------------------------------------------------ No results for input(s): CHOL, HDL, LDLCALC, TRIG, CHOLHDL, LDLDIRECT in the last 72 hours. ------------------------------------------------------------------------------------------------------------------ No results for input(s): TSH, T4TOTAL, T3FREE, THYROIDAB in the last 72 hours.  Invalid input(s): FREET3 ------------------------------------------------------------------------------------------------------------------ No results for input(s): VITAMINB12, FOLATE, FERRITIN, TIBC, IRON, RETICCTPCT in the last 72 hours.  Coagulation profile No results for input(s): INR, PROTIME in the last 168 hours.  No results for input(s): DDIMER in the last 72 hours.  Cardiac Enzymes No results for input(s): CKMB, TROPONINI, MYOGLOBIN in the last 168 hours.  Invalid input(s): CK ------------------------------------------------------------------------------------------------------------------ Invalid input(s): POCBNP   CBG:  Recent Labs Lab 08/19/15 1329 08/19/15 1944  GLUCAP 440* 419*       EKG: Independently reviewed. Sinus rhythm with a left anterior fascicular block   Assessment/Plan DKA (diabetic ketoacidoses) ([place]): Acute. Patient with complaints of urinary frequency, polydipsia, and weight loss of 30 pounds over the last 2 weeks.  Blood glucose initially 486, CO2 14, AG 21. UA positive for ketones, beta hydroxybutyrate elevated >8. The signs are suggestive of type 1 diabetes, but question mixed picture. -  Admit to stepdown  - Initiate glucose stabilizer protocol - BMPs every 4 hours 3, may need to order more BMPs if not close - Transition to subcutaneous insulin following  adequate closure of anion gap - Zofran prn nausea - Advance diet as tolerated to carb modified - Diabetic educator  Acute kidney injury: Acute. Creatinine at baseline appears to be less than 1, but acutely elevated at 1.63 on admission. Suspect that this is prerenal in nature given patient's elevated blood glucose. - Continue to monitor  Ulcerative colitis with history of chronic steroid use: Patient reports being started on mercaptopurine within the last 2 weeks and tapered off prednisone for recent flare. - Continue mercaptopurine  Pseudohyponatremia: secondary to patient's hyperglycemia  Code Status:   full Family Communication: bedside Disposition Plan: admit   Total time spent 55 minutes.Greater than 50% of this time was spent in counseling, explanation of diagnosis, planning of further management, and coordination of care  [facility] Pager [phone]  If 7PM-7AM, please contact night-coverage [email or web address] Password [facility] 08/19/2015, 8:14 PM

## 2015-08-19 NOTE — ED Provider Notes (Signed)
CSN: [id number]     Arrival date & time 08/19/15  1242 History   First MD Initiated Contact with Patient 08/19/15 1649     Chief Complaint  Patient presents with  . Hyperglycemia     (Consider location/radiation/quality/duration/timing/severity/associated sxs/prior Treatment) The history is provided by the patient and medical records.     Angel Frazier is a 60 year old male with history of left sided ulcerative colitis, hypertension, hemorrhoids, degenerative disc disease and osteoarthritis, history of long-term systemic steroid use, he presents to the emergency department direction of his PCP today for evaluation of hyperglycemia with suspicions of dehydration due to hypotension and tachycardia.  The patient was evaluated for 2 weeks of shortness of breath, weakness, weight loss and generalized fatigue, malaise, loss of appetite. He also complains of excessive thirst, dry mouth, polyuria, nocturia, is drinking gallons of water and still feels dehydrated and thirsty.  Patient states he's never had any problems with his blood sugars.  Denies diarrhea, abdominal pain, back pain, melena, hematochezia, chest pain.  Patient states that 2 days ago he called his gastroenterologist was concerned that his symptoms might be related to prednisone or mesalamine medications for his ulcerative colitis. Last week he finished a long prednisone taper for UC flare, managed by lower GI.  They obtained lab work which showed new elevated amylase, lipase, hyperglycemia ~420.  Roughly 1 week before that he had normal LFTs and CBC resulted at approximately one month before that he had normal chemistries.  PT denies hx of elevated triglycerided, denies hx of pancreatitis, denies current ETOH abuse.  Past Medical History  Diagnosis Date  . Erectile dysfunction   . Left sided ulcerative colitis ([place])   . Hypertension   . Bleeding hemorrhoids   . Anabolic steroid abuse   .  vitamin D deficiency 01/24/2013    01/23/13 -  level 29 - supplements 50k IU weekly x 6 recommended   . Avascular necrosis of femoral head ([place])     bilateral  . Spinal stenosis of lumbar region at multiple levels    Past Surgical History  Procedure Laterality Date  . Knee arthroscopy Right   . Colonoscopy      multiple  . Sigmoidoscopy    . Hemorrhoid banding  2012    With sigmoidoscopy   Family History  Problem Relation Age of Onset  . Diabetes      cousin  . Hypertension    . Colon cancer Neg Hx   . Diabetes Mother    Social History  Substance Use Topics  . Smoking status: Former Research scientist (life sciences)  . Smokeless tobacco: Never Used  . Alcohol Use: 0.5 oz/week    1 drink(s) per week    Review of Systems  Constitutional: Positive for activity change, appetite change, fatigue and unexpected weight change (30 lb weight loss in the past two weeks). Negative for fever, chills and diaphoresis.  HENT: Negative.   Eyes: Negative.   Respiratory: Positive for shortness of breath. Negative for cough, chest tightness and wheezing.   Cardiovascular: Negative.  Negative for chest pain, palpitations and leg swelling.  Gastrointestinal: Negative for nausea, vomiting, abdominal pain, diarrhea, constipation, blood in stool, abdominal distention and anal bleeding.  Endocrine: Positive for polydipsia and polyuria.  Genitourinary: Negative.  Negative for dysuria and hematuria.  Musculoskeletal: Positive for back pain.  Skin: Negative.  Negative for color change, pallor and rash.  Neurological: Positive for weakness and light-headedness. Negative for syncope, facial asymmetry and speech difficulty.  Psychiatric/Behavioral: Negative.  Negative for confusion.      Allergies  Review of patient's allergies indicates no known allergies.  Home Medications   Prior to Admission medications   Medication Sig Start Date End Date Taking? Authorizing Provider  Ascorbic Acid (VITAMIN C) 1000 MG tablet Take 1,000 mg by mouth daily.   Yes Historical  Provider, MD  aspirin 81 MG tablet Take 81 mg by mouth daily.     Yes Historical Provider, MD  Calcium Carbonate-Vitamin D (CALCIUM-VITAMIN D3 PO) Take 1 tablet by mouth daily. Calcium 1200 vit d3 plus   Yes Historical Provider, MD  Cholecalciferol (VITAMIN D3) 50000 UNITS CAPS Take 1 tablet by mouth once a week. Patient taking differently: Take 1 tablet by mouth every Monday.  01/26/13  Yes Gatha Mayer, MD  cyclobenzaprine (FLEXERIL) 10 MG tablet Take 10 mg by mouth 3 (three) times daily as needed for muscle spasms.   Yes Historical Provider, MD  mesalamine (CANASA) 1000 MG suppository Place 1 suppository (1,000 mg total) rectally at bedtime as needed. 07/12/15  Yes Gatha Mayer, MD  METHOCARBAMOL PO Take 500 mg by mouth daily as needed (for muscle spasms).    Yes Historical Provider, MD  Multiple Vitamins-Minerals (CENTRUM SILVER PO) Take 1 tablet by mouth daily.   Yes Historical Provider, MD  Omega-3 Fatty Acids (FISH OIL) 300 MG CAPS Take 300 mg by mouth daily.    Yes Historical Provider, MD  oxyCODONE-acetaminophen (PERCOCET/ROXICET) 5-325 MG tablet Take 2 tablets by mouth every 4 (four) hours as needed for severe pain. 06/14/15  Yes Hollace Kinnier Sofia, PA-C  vitamin B-12 (CYANOCOBALAMIN) 1000 MCG tablet Take 2,500 mcg by mouth daily.    Yes Historical Provider, MD   BP 111/84 mmHg  Pulse 79  Temp(Src) 98.3 F (36.8 C) (Oral)  Resp 20  SpO2 98% Physical Exam  Constitutional: He is oriented to person, place, and time. He appears well-developed. He is cooperative.  Non-toxic appearance. He has a sickly appearance. No distress.  Thin male  HENT:  Head: Normocephalic and atraumatic.  Nose: Nose normal.  Mouth/Throat: Oropharynx is clear and moist. No oropharyngeal exudate.  Eyes: Conjunctivae, EOM and lids are normal. Pupils are equal, round, and reactive to light. Right eye exhibits no discharge. Left eye exhibits no discharge. No scleral icterus.  Neck: Normal range of motion and phonation  normal. No JVD present. No tracheal deviation present. No thyromegaly present.  Cardiovascular: Normal rate, regular rhythm, normal heart sounds and intact distal pulses.  Exam reveals no gallop and no friction rub.   No murmur heard. Pulses:      Radial pulses are 2+ on the right side, and 2+ on the left side.       Dorsalis pedis pulses are 2+ on the right side, and 2+ on the left side.  Pulmonary/Chest: Effort normal and breath sounds normal. No accessory muscle usage. No respiratory distress. He has no decreased breath sounds. He has no wheezes. He has no rhonchi. He has no rales. He exhibits no tenderness.  Abdominal: Soft. Normal appearance and bowel sounds are normal. He exhibits no distension and no mass. There is no tenderness. There is no rigidity, no rebound, no guarding, no CVA tenderness and negative Murphy's sign.  Musculoskeletal: Normal range of motion. He exhibits no edema or tenderness.  Lymphadenopathy:    He has no cervical adenopathy.  Neurological: He is alert and oriented to person, place, and time. He has normal reflexes. No cranial nerve deficit. He exhibits  normal muscle tone. Coordination normal.  Skin: Skin is warm and dry. No bruising, no ecchymosis and no rash noted. He is not diaphoretic. No cyanosis or erythema. No pallor. Nails show clubbing.  Decreased skin turgor, diffusely dry  Psychiatric: He has a normal mood and affect. His behavior is normal. Judgment and thought content normal.  Nursing note and vitals reviewed.   ED Course  Procedures (including critical care time) Labs Review Labs Reviewed  BASIC METABOLIC PANEL - Abnormal; Notable for the following:    Sodium 130 (*)    Chloride 95 (*)    CO2 14 (*)    Glucose, Bld 486 (*)    Creatinine, Ser 1.63 (*)    GFR calc non Af Amer 44 (*)    GFR calc Af Amer 51 (*)    Anion gap 21 (*)    All other components within normal limits  CBC - Abnormal; Notable for the following:    WBC 3.4 (*)    All  other components within normal limits  URINALYSIS, ROUTINE W REFLEX MICROSCOPIC (NOT AT [facility]) - Abnormal; Notable for the following:    Specific Gravity, Urine 1.033 (*)    Glucose, UA >1000 (*)    Bilirubin Urine SMALL (*)    Ketones, ur >80 (*)    All other components within normal limits  BETA-HYDROXYBUTYRIC ACID - Abnormal; Notable for the following:    Beta-Hydroxybutyric Acid >8.00 (*)    All other components within normal limits  HEPATIC FUNCTION PANEL - Abnormal; Notable for the following:    Albumin 3.4 (*)    ALT 15 (*)    Total Bilirubin 2.0 (*)    Indirect Bilirubin 1.6 (*)    All other components within normal limits  LIPASE, BLOOD - Abnormal; Notable for the following:    Lipase 136 (*)    All other components within normal limits  URINE MICROSCOPIC-ADD ON - Abnormal; Notable for the following:    Squamous Epithelial / LPF 0-5 (*)    Bacteria, UA RARE (*)    Casts HYALINE CASTS (*)    All other components within normal limits  BASIC METABOLIC PANEL - Abnormal; Notable for the following:    CO2 14 (*)    Glucose, Bld 212 (*)    Creatinine, Ser 1.36 (*)    GFR calc non Af Amer 55 (*)    Anion gap 19 (*)    All other components within normal limits  GLUCOSE, CAPILLARY - Abnormal; Notable for the following:    Glucose-Capillary 175 (*)    All other components within normal limits  CBG MONITORING, ED - Abnormal; Notable for the following:    Glucose-Capillary 440 (*)    All other components within normal limits  I-STAT VENOUS BLOOD GAS, ED - Abnormal; Notable for the following:    pH, Ven 7.170 (*)    pCO2, Ven 33.1 (*)    pO2, Ven 21.0 (*)    Bicarbonate 12.1 (*)    Acid-base deficit 15.0 (*)    All other components within normal limits  CBG MONITORING, ED - Abnormal; Notable for the following:    Glucose-Capillary 419 (*)    All other components within normal limits  CBG MONITORING, ED - Abnormal; Notable for the following:    Glucose-Capillary 381 (*)     All other components within normal limits  CBG MONITORING, ED - Abnormal; Notable for the following:    Glucose-Capillary 354 (*)    All other components  within normal limits  MRSA PCR SCREENING  SALICYLATE LEVEL  HEMOGLOBIN A1C  BLOOD GAS, VENOUS  BASIC METABOLIC PANEL  CBC  I-STAT TROPOININ, ED    Imaging Review Dg Chest 2 View  08/19/2015  CLINICAL DATA:  Loss of appetite EXAM: CHEST  2 VIEW COMPARISON:  01/16/2010 FINDINGS: The heart size and mediastinal contours are within normal limits. Both lungs are clear. The visualized skeletal structures are unremarkable. IMPRESSION: No active cardiopulmonary disease. Electronically Signed   By: Inez Catalina M.D.   On: 08/19/2015 18:05   Ct Abdomen Pelvis W Contrast  08/19/2015  CLINICAL DATA:  Elevated LFTs and elevated lipase and amylase EXAM: CT ABDOMEN AND PELVIS WITH CONTRAST TECHNIQUE: Multidetector CT imaging of the abdomen and pelvis was performed using the standard protocol following bolus administration of intravenous contrast. CONTRAST:  69m OMNIPAQUE IOHEXOL 300 MG/ML  SOLN COMPARISON:  None. FINDINGS: Lung bases demonstrated 8 mm nodule in the posterior right lower lobe on the first image. It is incompletely evaluated. Some vague nodular changes are noted in the right middle lobe also on the first image and incompletely evaluated. A few scattered smaller nodules are noted in the left base. The liver, gallbladder, spleen, adrenal glands and pancreas are within normal limits. The kidneys are well visualized bilaterally without renal calculi or urinary tract obstructive changes. The bladder is well distended. Prostate is within normal limits. No pelvic mass lesion is seen. The appendix is within normal limits. No acute bony abnormality is noted. IMPRESSION: No findings to correspond with the given clinical history. Multiple pulmonary nodules are noted. The largest of these measures 8 mm in the right lower lobe. These can be further evaluated  on a nonemergent basis. Electronically Signed   By: MInez CatalinaM.D.   On: 08/19/2015 21:24   I have personally reviewed and evaluated these images and lab results as part of my medical decision-making.   EKG Interpretation   Date/Time:  Friday August 19 2015 18:17:28 EDT Ventricular Rate:  89 PR Interval:  147 QRS Duration: 99 QT Interval:  358 QTC Calculation: 436 R Axis:   -48 Text Interpretation:  Sinus rhythm Left anterior fascicular block Abnormal  R-wave progression, early transition No significant change was found  Confirmed by CAMPOS  MD, KEVIN ([id number] on 08/19/2015 7:42:22 PM      MDM   Pt with hx of ulcerative colitis with long-term prednisone use, presents to the ER with 2 weeks of generalized malaise, weakness, 30 lb weightloss, exertional SOB, Polydipsia, polyuria and nocturia.  He complained of these symptoms to both his GI doctor and his PCP who both obtained labs this week which were pertinent for elevated amylase, lipase and blood glucose. His PCP sent him to the ER today for evaluation of new hyperglycemia with hypotension and tachycardia.  He does not have a hx of DM or pancreatitis, recent labs in early February has normal chemistries and recent normal LFT's.  In the ER he presented with soft SBP- 90, and mild tachycardia with exertional dyspnea, but normal SpO2 and RR at rest.  Work up significant for hyperglycemia ~400's, AG 21 with CO2 14.  Venous BG sig for pH 7.17, Urine with + ketones >80, consistent with DKA, new onset diabetes (?).  CT abd/pelvis obtained to evaluate pancreas.  Pt does not have any abdominal tenderness, do not suspect gallstone pancreatitis. Several labs are pending at this time, however pt clearly has DKA, will need admission for further work up and treatment.  Pt will be admitted for DKA to step down unit by Dr. Tamala Julian.  CRITICAL CARE Performed by: Delsa Grana Total critical care time: 60 Critical care time was exclusive of separately  billable procedures and treating other patients. Critical care was necessary to treat or prevent imminent or life-threatening deterioration. Critical care was time spent personally by me on the following activities: development of treatment plan with patient and/or surrogate as well as nursing, discussions with consultants, evaluation of patient's response to treatment, examination of patient, obtaining history from patient or surrogate, ordering and performing treatments and interventions, ordering and review of laboratory studies, ordering and review of radiographic studies, pulse oximetry and re-evaluation of patient's condition.   Final diagnoses:  Diabetic ketoacidosis without coma associated with other specified diabetes mellitus ([place])       Delsa Grana, PA-C 08/20/15 0142  Jola Schmidt, MD 08/22/15 [phone]

## 2015-08-20 ENCOUNTER — Encounter (HOSPITAL_COMMUNITY): Payer: Self-pay

## 2015-08-20 DIAGNOSIS — N179 Acute kidney failure, unspecified: Secondary | ICD-10-CM | POA: Diagnosis present

## 2015-08-20 LAB — BASIC METABOLIC PANEL
Anion gap: 13 (ref 5–15)
Anion gap: 15 (ref 5–15)
Anion gap: 18 — ABNORMAL HIGH (ref 5–15)
Anion gap: 19 — ABNORMAL HIGH (ref 5–15)
Anion gap: 9 (ref 5–15)
BUN: 14 mg/dL (ref 6–20)
BUN: 16 mg/dL (ref 6–20)
BUN: 14 mg/dL (ref 6–20)
BUN: 15 mg/dL (ref 6–20)
BUN: 18 mg/dL (ref 6–20)
CALCIUM: 9.2 mg/dL (ref 8.9–10.3)
CALCIUM: 9.2 mg/dL (ref 8.9–10.3)
CALCIUM: 9.2 mg/dL (ref 8.9–10.3)
CHLORIDE: 104 mmol/L (ref 101–111)
CHLORIDE: 102 mmol/L (ref 101–111)
CO2: 13 mmol/L — AB (ref 22–32)
CO2: 14 mmol/L — ABNORMAL LOW (ref 22–32)
CO2: 15 mmol/L — ABNORMAL LOW (ref 22–32)
CO2: 17 mmol/L — ABNORMAL LOW (ref 22–32)
CO2: 18 mmol/L — ABNORMAL LOW (ref 22–32)
CREATININE: 1.12 mg/dL (ref 0.61–1.24)
Calcium: 8.9 mg/dL (ref 8.9–10.3)
Calcium: 9.5 mg/dL (ref 8.9–10.3)
Chloride: 104 mmol/L (ref 101–111)
Chloride: 109 mmol/L (ref 101–111)
Chloride: 105 mmol/L (ref 101–111)
Creatinine, Ser: 1.1 mg/dL (ref 0.61–1.24)
Creatinine, Ser: 1.15 mg/dL (ref 0.61–1.24)
Creatinine, Ser: 1.21 mg/dL (ref 0.61–1.24)
Creatinine, Ser: 1.36 mg/dL — ABNORMAL HIGH (ref 0.61–1.24)
GFR calc Af Amer: >60 mL/min (ref >60)
GFR calc Af Amer: >60 mL/min (ref >60)
GFR calc Af Amer: >60 mL/min (ref >60)
GFR calc non Af Amer: >60 mL/min (ref >60)
GFR calc non Af Amer: >60 mL/min (ref >60)
GFR, EST NON AFRICAN AMERICAN: 55 mL/min — AB (ref >60)
GLUCOSE: 138 mg/dL — AB (ref 65–99)
GLUCOSE: 198 mg/dL — AB (ref 65–99)
GLUCOSE: 173 mg/dL — AB (ref 65–99)
GLUCOSE: 147 mg/dL — AB (ref 65–99)
Glucose, Bld: 212 mg/dL — ABNORMAL HIGH (ref 65–99)
POTASSIUM: 3.4 mmol/L — AB (ref 3.5–5.1)
POTASSIUM: 3.7 mmol/L (ref 3.5–5.1)
POTASSIUM: 3.8 mmol/L (ref 3.5–5.1)
POTASSIUM: 3.9 mmol/L (ref 3.5–5.1)
Potassium: 3.2 mmol/L — ABNORMAL LOW (ref 3.5–5.1)
SODIUM: 135 mmol/L (ref 135–145)
SODIUM: 135 mmol/L (ref 135–145)
Sodium: 134 mmol/L — ABNORMAL LOW (ref 135–145)
Sodium: 136 mmol/L (ref 135–145)
Sodium: 135 mmol/L (ref 135–145)

## 2015-08-20 LAB — GLUCOSE, CAPILLARY
GLUCOSE-CAPILLARY: 175 mg/dL — AB (ref 65–99)
GLUCOSE-CAPILLARY: 176 mg/dL — AB (ref 65–99)
GLUCOSE-CAPILLARY: 193 mg/dL — AB (ref 65–99)
GLUCOSE-CAPILLARY: 268 mg/dL — AB (ref 65–99)
GLUCOSE-CAPILLARY: 343 mg/dL — AB (ref 65–99)
GLUCOSE-CAPILLARY: 311 mg/dL — AB (ref 65–99)
GLUCOSE-CAPILLARY: 342 mg/dL — AB (ref 65–99)
GLUCOSE-CAPILLARY: 278 mg/dL — AB (ref 65–99)
GLUCOSE-CAPILLARY: 162 mg/dL — AB (ref 65–99)
GLUCOSE-CAPILLARY: 189 mg/dL — AB (ref 65–99)
GLUCOSE-CAPILLARY: 106 mg/dL — AB (ref 65–99)
Glucose-Capillary: 134 mg/dL — ABNORMAL HIGH (ref 65–99)
Glucose-Capillary: 142 mg/dL — ABNORMAL HIGH (ref 65–99)
Glucose-Capillary: 167 mg/dL — ABNORMAL HIGH (ref 65–99)
Glucose-Capillary: 301 mg/dL — ABNORMAL HIGH (ref 65–99)
Glucose-Capillary: 273 mg/dL — ABNORMAL HIGH (ref 65–99)
Glucose-Capillary: 181 mg/dL — ABNORMAL HIGH (ref 65–99)
Glucose-Capillary: 208 mg/dL — ABNORMAL HIGH (ref 65–99)
Glucose-Capillary: 137 mg/dL — ABNORMAL HIGH (ref 65–99)

## 2015-08-20 LAB — HEMOGLOBIN A1C
HEMOGLOBIN A1C: 13.2 % — AB (ref 4.8–5.6)
Mean Plasma Glucose: 332 mg/dL

## 2015-08-20 LAB — CBC
HCT: 28.9 % — ABNORMAL LOW (ref 39.0–52.0)
HEMOGLOBIN: 10.1 g/dL — AB (ref 13.0–17.0)
MCH: 30.1 pg (ref 26.0–34.0)
MCHC: 34.9 g/dL (ref 30.0–36.0)
MCV: 86.3 fL (ref 78.0–100.0)
Platelets: 275 10*3/uL (ref 150–400)
RBC: 3.35 MIL/uL — ABNORMAL LOW (ref 4.22–5.81)
RDW: 12 % (ref 11.5–15.5)
WBC: 4.3 10*3/uL (ref 4.0–10.5)

## 2015-08-20 LAB — MRSA PCR SCREENING: MRSA BY PCR: NEGATIVE

## 2015-08-20 MED ORDER — POTASSIUM CHLORIDE CRYS ER 20 MEQ PO TBCR
40.0000 meq | EXTENDED_RELEASE_TABLET | Freq: Once | ORAL | Status: AC
  Administered 2015-08-20: 40 meq via ORAL
  Filled 2015-08-20: qty 2

## 2015-08-20 MED ORDER — LIVING WELL WITH DIABETES BOOK
Freq: Once | Status: AC
  Administered 2015-08-20: 13:00:00
  Filled 2015-08-20: qty 1

## 2015-08-20 MED ORDER — SODIUM CHLORIDE 0.9 % IV SOLN
INTRAVENOUS | Status: DC
  Administered 2015-08-20: 17:00:00 via INTRAVENOUS

## 2015-08-20 NOTE — Progress Notes (Signed)
Utilization Review Completed.Donne Anon T3/18/2017

## 2015-08-20 NOTE — Progress Notes (Signed)
Patient refuses to take scheduled morning medications. States that he was told by PCP to withhold taking medicines until after scheduled appointment for "shot" on Tuesday. Patient is worried that he will not make this appointment as it took him very long to arrange it and he will be charged a fee for not showing up.

## 2015-08-20 NOTE — Progress Notes (Signed)
Inpatient Diabetes Program Recommendations  AACE/ADA: New Consensus Statement on Inpatient Glycemic Control (2015)  Target Ranges:  Prepandial:   less than 140 mg/dL      Peak postprandial:   less than 180 mg/dL (1-2 hours)      Critically ill patients:  140 - 180 mg/dL   Review of Glycemic Control New-onset DM Inpatient Diabetes Program Recommendations:  Outpatient Referral: will order with cosign required Basic bedside education has been ordered for RN to initiate.   Will follow. Thank you  Raoul Pitch BSN, RN,CDE Inpatient Diabetes Coordinator [phone] (team pager)

## 2015-08-20 NOTE — Progress Notes (Signed)
[place] TEAM 1 - Stepdown/ICU TEAM PROGRESS NOTE  Angel Frazier OMV:[id number] DOB: 1955-11-30 DOA: 08/19/2015 PCP: [name], MD  Admit HPI / Brief Narrative: 60 year old male with a history of ulcerative colitis, chronic steroid use, osteoarthritis, and hypertension who presented with complaints of 2 weeks of generalized weakness with 30 pound weight loss, shortness of breath, nausea, loss of appetite, and urinary frequency. He endorsed a family history of his son being a type I diabetic. He recently had a flare of his ulcerative colitis for which he received a tapering steroid course.   Upon admission patient was found to have a blood glucose of 486, anion gap 21, CO2 14, beta-hydroxybutyric level >8. UA was positive for ketones and glucose. Venous pH was 7.17. Patient's initial chest x-ray showed no acute abnormalities.   HPI/Subjective: The pt is sleeping soundly.  He awakens to my exam, but is not very talkative and is most interested in going back to sleep.  He is frustrated that I am not planning to d/c him home today.  He denies cp, sob, n/v, or abdom pain.    Assessment/Plan:  DKA  Gap is not yet fully closed, and bicarb not yet at target of 18 or > - despite this CBG is climbing again after transition to D5 IVF - switch back to NS IVF prn CBG >200 - cont IV insulin until bicarb 18 or > and anion gap fully closed (12 or less)   Newly diagnosed DM A1c 13.2 - begin DM education - will clearly require insulin at d/c   Acute kidney injury Creatinine at baseline appears to be less than 1 - elevated at 1.63 on admission - follow w/ ongoing hydration   Ulcerative colitis with history of chronic steroid use Continue mercaptopurine - no evidence of acute flair at this time   Pseudohyponatremia secondary to hyperglycemia  Obesity - Body mass index is 32.23 kg/(m^2).  Code Status: FULL Family Communication: wife present at bedside  Disposition Plan: SDU    Consultants: none  Procedures: none  Antibiotics: none  DVT prophylaxis: lovenox   Objective: Blood pressure 110/79, pulse 91, temperature 98.2 F (36.8 C), temperature source Oral, resp. rate 15, height 5' 11"  (1.803 m), weight 104.781 kg (231 lb), SpO2 98 %.  Intake/Output Summary (Last 24 hours) at 08/20/15 1716 Last data filed at 08/20/15 1244  Gross per 24 hour  Intake 1324.42 ml  Output    800 ml  Net 524.42 ml   Exam: General: No acute respiratory distress Lungs: Clear to auscultation bilaterally without wheezes or crackles Cardiovascular: Regular rate and rhythm without murmur gallop or rub normal S1 and S2 Abdomen: Nontender, nondistended, soft, bowel sounds positive, no rebound, no ascites, no appreciable mass Extremities: No significant cyanosis, clubbing, or edema bilateral lower extremities  Data Reviewed:  Basic Metabolic Panel:  Recent Labs Lab 08/18/15 1056 08/19/15 1326 08/20/15 0033 08/20/15 0428 08/20/15 0745  NA 128* 130* 135 135 134*  K 4.4 5.0 3.8 3.9 3.4*  CL 92* 95* 102 104 104  CO2 15* 14* 14* 13* 15*  GLUCOSE 419* 486* 212* 138* 198*  BUN 22 19 18 16 14   CREATININE 1.37 1.63* 1.36* 1.15 1.10  CALCIUM 10.1 10.0 9.5 9.2 8.9    CBC:  Recent Labs Lab 08/18/15 1056 08/19/15 1326 08/20/15 0428  WBC 4.7 3.4* 4.3  NEUTROABS 2.7  --   --   HGB 14.6 14.8 10.1*  HCT 43.1 42.5 28.9*  MCV 91.0 89.5 86.3  PLT 374.0 332 275    Liver Function Tests:  Recent Labs Lab 08/18/15 1056 08/19/15 1857  AST 11 20  ALT 14 15*  ALKPHOS 49 43  BILITOT 0.7 2.0*  PROT 8.6* 7.4  ALBUMIN 4.3 3.4*    Recent Labs Lab 08/18/15 1056 08/19/15 1857  LIPASE 347.0* 136*  AMYLASE 172*  --    CBG:  Recent Labs Lab 08/20/15 1132 08/20/15 1243 08/20/15 1347 08/20/15 1537 08/20/15 1646  GLUCAP 343* 311* 273* 342* 278*    Recent Results (from the past 240 hour(s))  MRSA PCR Screening     Status: None   Collection Time: 08/20/15  12:51 AM  Result Value Ref Range Status   MRSA by PCR NEGATIVE NEGATIVE Final    Comment:        The GeneXpert MRSA Assay (FDA approved for NASAL specimens only), is one component of a comprehensive MRSA colonization surveillance program. It is not intended to diagnose MRSA infection nor to guide or monitor treatment for MRSA infections.      Studies:   Recent x-ray studies have been reviewed in detail by the Attending Physician  Scheduled Meds:  Scheduled Meds: . aspirin EC  81 mg Oral Daily  . enoxaparin (LOVENOX) injection  40 mg Subcutaneous Daily  . insulin regular  0-10 Units Intravenous TID WC  . mercaptopurine  50 mg Oral Daily  . multivitamin with minerals  1 tablet Oral Daily  . omega-3 acid ethyl esters  1 g Oral Daily  . potassium chloride  40 mEq Oral Once  . sodium chloride flush  3 mL Intravenous Q12H  . vitamin B-12  2,500 mcg Oral Daily  . vitamin C  1,000 mg Oral Daily    Time spent on care of this patient: 35 mins   Angel Frazier T , MD   [facility] Office  [phone] Pager - Text Page per Shea Evans as per below:  On-Call/Text Page:      [email or web address]      password [email or web address]  If 7PM-7AM, please contact night-coverage [email or web address] Password [email or web address] 08/20/2015, 5:16 PM   LOS: 1 day

## 2015-08-20 NOTE — Progress Notes (Signed)
Nutrition Education Note  RD consulted for nutrition education regarding diabetes. He lives with wife. Pt says his son is also diabetic. He eats at home mostly and limits eating out to special occasions. Pt demonstrates good understanding of cho containing foods. Concern that portion control may be a barrier for him. We talked about him adding a side salad at lunch instead of eating 2 sandwiches and/or chips and at dinner which is usually a hot meal take only 1 plate of food (to include a protein, starch and non-starchy vegetable.  Lab Results  Component Value Date   HGBA1C 13.2* 08/19/2015    RD provided/reviewed handout from the [employer]. Discussed different food groups and their effects on blood sugar, emphasizing carbohydrate-containing foods. Reviewed list of common  carbohydrates and recommended serving sizes of common foods.  Discussed importance of controlled and consistent carbohydrate intake throughout the day. Provided examples of ways to balance meals/snacks and encouraged intake of high-fiber, whole grain complex carbohydrates and limiting seconds at meal time. Teach back method used.  Expect good compliance. Pt able to identify all cho type foods served in his breakfast this morning.   Body mass index is 32.23 kg/(m^2). Pt meets criteria for obesity class I based on current BMI.  Current diet order is CHO Modifed, patient is consuming approximately 100% of meals at this time. Labs and medications reviewed. No further nutrition interventions warranted at this time.  Colman Cater MS,RD,CSG,LDN Office: [phone] Pager: [phone]

## 2015-08-20 NOTE — Plan of Care (Signed)
Problem: Education: Goal: Knowledge of disease or condition will improve Outcome: Progressing Patient watched diabetic education video # 501 in room.

## 2015-08-21 LAB — GLUCOSE, CAPILLARY
GLUCOSE-CAPILLARY: 104 mg/dL — AB (ref 65–99)
GLUCOSE-CAPILLARY: 120 mg/dL — AB (ref 65–99)
GLUCOSE-CAPILLARY: 158 mg/dL — AB (ref 65–99)
GLUCOSE-CAPILLARY: 166 mg/dL — AB (ref 65–99)
GLUCOSE-CAPILLARY: 183 mg/dL — AB (ref 65–99)
GLUCOSE-CAPILLARY: 232 mg/dL — AB (ref 65–99)
GLUCOSE-CAPILLARY: 319 mg/dL — AB (ref 65–99)
GLUCOSE-CAPILLARY: 361 mg/dL — AB (ref 65–99)
Glucose-Capillary: 135 mg/dL — ABNORMAL HIGH (ref 65–99)
Glucose-Capillary: 163 mg/dL — ABNORMAL HIGH (ref 65–99)
Glucose-Capillary: 185 mg/dL — ABNORMAL HIGH (ref 65–99)
Glucose-Capillary: 159 mg/dL — ABNORMAL HIGH (ref 65–99)
Glucose-Capillary: 194 mg/dL — ABNORMAL HIGH (ref 65–99)
Glucose-Capillary: 264 mg/dL — ABNORMAL HIGH (ref 65–99)
Glucose-Capillary: 394 mg/dL — ABNORMAL HIGH (ref 65–99)
Glucose-Capillary: 390 mg/dL — ABNORMAL HIGH (ref 65–99)

## 2015-08-21 LAB — HEPATIC FUNCTION PANEL
ALBUMIN: 2.6 g/dL — AB (ref 3.5–5.0)
ALT: 15 U/L — ABNORMAL LOW (ref 17–63)
AST: 13 U/L — ABNORMAL LOW (ref 15–41)
Alkaline Phosphatase: 34 U/L — ABNORMAL LOW (ref 38–126)
BILIRUBIN TOTAL: 0.6 mg/dL (ref 0.3–1.2)
Bilirubin, Direct: <0.1 mg/dL — ABNORMAL LOW (ref 0.1–0.5)
Total Protein: 6.1 g/dL — ABNORMAL LOW (ref 6.5–8.1)

## 2015-08-21 LAB — BASIC METABOLIC PANEL
Anion gap: 12 (ref 5–15)
BUN: 12 mg/dL (ref 6–20)
CALCIUM: 9.1 mg/dL (ref 8.9–10.3)
CO2: 19 mmol/L — ABNORMAL LOW (ref 22–32)
CREATININE: 1.02 mg/dL (ref 0.61–1.24)
Chloride: 107 mmol/L (ref 101–111)
GFR calc Af Amer: >60 mL/min (ref >60)
GLUCOSE: 179 mg/dL — AB (ref 65–99)
Potassium: 3.5 mmol/L (ref 3.5–5.1)
Sodium: 138 mmol/L (ref 135–145)

## 2015-08-21 LAB — CBC
HEMATOCRIT: 34.6 % — AB (ref 39.0–52.0)
Hemoglobin: 11.9 g/dL — ABNORMAL LOW (ref 13.0–17.0)
MCH: 30 pg (ref 26.0–34.0)
MCHC: 34.4 g/dL (ref 30.0–36.0)
MCV: 87.2 fL (ref 78.0–100.0)
Platelets: 230 10*3/uL (ref 150–400)
RBC: 3.97 MIL/uL — ABNORMAL LOW (ref 4.22–5.81)
RDW: 11.9 % (ref 11.5–15.5)
WBC: 2.9 10*3/uL — ABNORMAL LOW (ref 4.0–10.5)

## 2015-08-21 LAB — LIPASE, BLOOD: LIPASE: 87 U/L — AB (ref 11–51)

## 2015-08-21 MED ORDER — INSULIN ASPART 100 UNIT/ML ~~LOC~~ SOLN
0.0000 [IU] | Freq: Three times a day (TID) | SUBCUTANEOUS | Status: DC
  Administered 2015-08-22: 11 [IU] via SUBCUTANEOUS
  Administered 2015-08-22: 15 [IU] via SUBCUTANEOUS
  Administered 2015-08-22 – 2015-08-23 (×3): 7 [IU] via SUBCUTANEOUS
  Administered 2015-08-24: 11 [IU] via SUBCUTANEOUS

## 2015-08-21 MED ORDER — INSULIN ASPART 100 UNIT/ML ~~LOC~~ SOLN
3.0000 [IU] | Freq: Three times a day (TID) | SUBCUTANEOUS | Status: DC
  Administered 2015-08-22 (×2): 3 [IU] via SUBCUTANEOUS

## 2015-08-21 MED ORDER — INSULIN ASPART 100 UNIT/ML ~~LOC~~ SOLN
0.0000 [IU] | Freq: Every day | SUBCUTANEOUS | Status: DC
  Administered 2015-08-21: 5 [IU] via SUBCUTANEOUS
  Administered 2015-08-22: 3 [IU] via SUBCUTANEOUS
  Administered 2015-08-23: 2 [IU] via SUBCUTANEOUS

## 2015-08-21 MED ORDER — SODIUM CHLORIDE 0.9 % IV SOLN
INTRAVENOUS | Status: DC
  Administered 2015-08-21: 19:00:00 via INTRAVENOUS

## 2015-08-21 MED ORDER — INSULIN GLARGINE 100 UNIT/ML ~~LOC~~ SOLN
20.0000 [IU] | Freq: Every day | SUBCUTANEOUS | Status: DC
Start: 2015-08-21 — End: 2015-08-22
  Administered 2015-08-21: 20 [IU] via SUBCUTANEOUS
  Filled 2015-08-21 (×2): qty 0.2

## 2015-08-21 NOTE — Progress Notes (Signed)
[place] TEAM 1 - Stepdown/ICU TEAM PROGRESS NOTE  ANTIONO ETTINGER JJO:[id number] DOB: 1956-01-31 DOA: 08/19/2015 PCP: [name], MD  Admit HPI / Brief Narrative: 60 year old male with a history of ulcerative colitis, chronic steroid use, osteoarthritis, and hypertension who presented with complaints of 2 weeks of generalized weakness with 30 pound weight loss, shortness of breath, nausea, loss of appetite, and urinary frequency. He endorsed a family history of his son being a type I diabetic. He recently had a flare of his ulcerative colitis for which he received a tapering steroid course.   Upon admission patient was found to have a blood glucose of 486, anion gap 21, CO2 14, beta-hydroxybutyric level >8. UA was positive for ketones and glucose. Venous pH was 7.17. Patient's initial chest x-ray showed no acute abnormalities.   HPI/Subjective: The patient is resting comfortably in bed.  He denies chest pain shortness breath fevers chills nausea or vomiting.  He admits that he is somewhat overwhelmed with his new diagnosis of diabetes and is not sure if he will be comfortable giving himself insulin shots.  Assessment/Plan:  DKA  Gap is now closed and bicarbonate is in range - transition off IV insulin today   Newly diagnosed DM A1c 13.2 - continue DM education - will likely require frequent titration of insulin to reach appropriate maintenance dose - initiate Lantus plus meal coverage plus sliding scale and follow CBG  Acute kidney injury Creatinine at baseline appears to be less than 1 - elevated at 1.63 on admission - creatinine has now essentially normalized  Ulcerative colitis with history of chronic steroid use Continue mercaptopurine - no evidence of acute flair at this time   Pseudohyponatremia secondary to hyperglycemia - resolved  Obesity - Body mass index is 32.23 kg/(m^2).  Code Status: FULL Family Communication: wife present at bedside  Disposition Plan: SDU    Consultants: none  Procedures: none  Antibiotics: none  DVT prophylaxis: lovenox   Objective: Blood pressure 113/76, pulse 91, temperature 97.3 F (36.3 C), temperature source Oral, resp. rate 17, height 5' 11"  (1.803 m), weight 104.781 kg (231 lb), SpO2 100 %.  Intake/Output Summary (Last 24 hours) at 08/21/15 1530 Last data filed at 08/21/15 1210  Gross per 24 hour  Intake    720 ml  Output   1200 ml  Net   -480 ml   Exam: General: No acute respiratory distress - alert and conversant Lungs: Clear to auscultation bilaterally  Cardiovascular: Regular rate and rhythm without murmur gallop or rub normal S1 and S2 Abdomen: Nontender, nondistended, soft, bowel sounds positive, no rebound, no ascites, no appreciable mass Extremities: No significant cyanosis, clubbing, edema bilateral lower extremities  Data Reviewed:  Basic Metabolic Panel:  Recent Labs Lab 08/20/15 0428 08/20/15 0745 08/20/15 1813 08/20/15 2204 08/21/15 0440  NA 135 134* 136 135 138  K 3.9 3.4* 3.2* 3.7 3.5  CL 104 104 109 105 107  CO2 13* 15* 18* 17* 19*  GLUCOSE 138* 198* 173* 147* 179*  BUN 16 14 14 15 12   CREATININE 1.15 1.10 1.21 1.12 1.02  CALCIUM 9.2 8.9 9.2 9.2 9.1    CBC:  Recent Labs Lab 08/18/15 1056 08/19/15 1326 08/20/15 0428 08/21/15 0440  WBC 4.7 3.4* 4.3 2.9*  NEUTROABS 2.7  --   --   --   HGB 14.6 14.8 10.1* 11.9*  HCT 43.1 42.5 28.9* 34.6*  MCV 91.0 89.5 86.3 87.2  PLT 374.0 332 275 230    Liver Function  Tests:  Recent Labs Lab 08/18/15 1056 08/19/15 1857 08/21/15 0440  AST 11 20 13*  ALT 14 15* 15*  ALKPHOS 49 43 34*  BILITOT 0.7 2.0* 0.6  PROT 8.6* 7.4 6.1*  ALBUMIN 4.3 3.4* 2.6*    Recent Labs Lab 08/18/15 1056 08/19/15 1857 08/21/15 0440  LIPASE 347.0* 136* 87*  AMYLASE 172*  --   --    CBG:  Recent Labs Lab 08/21/15 0816 08/21/15 0951 08/21/15 1208 08/21/15 1320 08/21/15 1518  GLUCAP 159* 264* 232* 183* 319*    Recent Results  (from the past 240 hour(s))  MRSA PCR Screening     Status: None   Collection Time: 08/20/15 12:51 AM  Result Value Ref Range Status   MRSA by PCR NEGATIVE NEGATIVE Final    Comment:        The GeneXpert MRSA Assay (FDA approved for NASAL specimens only), is one component of a comprehensive MRSA colonization surveillance program. It is not intended to diagnose MRSA infection nor to guide or monitor treatment for MRSA infections.      Studies:   Recent x-ray studies have been reviewed in detail by the Attending Physician  Scheduled Meds:  Scheduled Meds: . aspirin EC  81 mg Oral Daily  . enoxaparin (LOVENOX) injection  40 mg Subcutaneous Daily  . insulin regular  0-10 Units Intravenous TID WC  . mercaptopurine  50 mg Oral Daily  . multivitamin with minerals  1 tablet Oral Daily  . omega-3 acid ethyl esters  1 g Oral Daily  . sodium chloride flush  3 mL Intravenous Q12H  . vitamin B-12  2,500 mcg Oral Daily  . vitamin C  1,000 mg Oral Daily    Time spent on care of this patient: 35 mins   Lubertha Leite T , MD   [facility] Office  [phone] Pager - Text Page per Shea Evans as per below:  On-Call/Text Page:      [email or web address]      password [email or web address]  If 7PM-7AM, please contact night-coverage [email or web address] Password [email or web address] 08/21/2015, 3:30 PM   LOS: 2 days

## 2015-08-21 NOTE — Plan of Care (Signed)
Problem: Education: Goal: Knowledge of disease or condition will improve Outcome: Progressing Patient watched video #502. RN educated about what types of foods have carbohydrates. Patient states he is overwhelmed at the amount of information.

## 2015-08-22 DIAGNOSIS — Z794 Long term (current) use of insulin: Secondary | ICD-10-CM

## 2015-08-22 LAB — GLUCOSE, CAPILLARY
GLUCOSE-CAPILLARY: 267 mg/dL — AB (ref 65–99)
GLUCOSE-CAPILLARY: 310 mg/dL — AB (ref 65–99)
Glucose-Capillary: 278 mg/dL — ABNORMAL HIGH (ref 65–99)
Glucose-Capillary: 247 mg/dL — ABNORMAL HIGH (ref 65–99)
Glucose-Capillary: 309 mg/dL — ABNORMAL HIGH (ref 65–99)
Glucose-Capillary: 188 mg/dL — ABNORMAL HIGH (ref 65–99)
Glucose-Capillary: 253 mg/dL — ABNORMAL HIGH (ref 65–99)

## 2015-08-22 LAB — BASIC METABOLIC PANEL
Anion gap: 11 (ref 5–15)
BUN: 9 mg/dL (ref 6–20)
CO2: 19 mmol/L — ABNORMAL LOW (ref 22–32)
CREATININE: 0.89 mg/dL (ref 0.61–1.24)
Calcium: 8.8 mg/dL — ABNORMAL LOW (ref 8.9–10.3)
Chloride: 106 mmol/L (ref 101–111)
Glucose, Bld: 295 mg/dL — ABNORMAL HIGH (ref 65–99)
POTASSIUM: 3.5 mmol/L (ref 3.5–5.1)
SODIUM: 136 mmol/L (ref 135–145)

## 2015-08-22 MED ORDER — INSULIN GLARGINE 100 UNIT/ML ~~LOC~~ SOLN
30.0000 [IU] | Freq: Every day | SUBCUTANEOUS | Status: DC
  Administered 2015-08-22 – 2015-08-23 (×2): 30 [IU] via SUBCUTANEOUS
  Filled 2015-08-22 (×3): qty 0.3

## 2015-08-22 MED ORDER — INSULIN ASPART 100 UNIT/ML ~~LOC~~ SOLN
6.0000 [IU] | Freq: Three times a day (TID) | SUBCUTANEOUS | Status: DC
  Administered 2015-08-22 – 2015-08-24 (×6): 6 [IU] via SUBCUTANEOUS

## 2015-08-22 NOTE — Progress Notes (Signed)
Inpatient Diabetes Program Recommendations  AACE/ADA: New Consensus Statement on Inpatient Glycemic Control (2015)  Target Ranges:  Prepandial:   less than 140 mg/dL      Peak postprandial:   less than 180 mg/dL (1-2 hours)      Critically ill patients:  140 - 180 mg/dL   Review of Glycemic Control  Results for Angel Frazier, Angel Frazier (MRN [id number]) as of 08/22/2015 15:57  Ref. Range 08/21/2015 21:25 08/22/2015 00:27 08/22/2015 03:25 08/22/2015 07:53 08/22/2015 12:21  Glucose-Capillary Latest Ref Range: 65-99 mg/dL 390 (H) 310 (H) 278 (H) 247 (H) 309 (H)    Inpatient Diabetes Program Recommendations:  Increase Lantus to 30 units QHS Increase Novolog to 8 units tidwc for meal coverage insulin  Long discussion with pt regarding new diagnosis of DM. Pt states he is overwelmed. Has watched several videos and seems interested in insulin pen vs vial and syringe. Will place order for case manager to check on cost of insulin pen copay. Discussed importance of diet, exercise and how they affect blood sugars. Stressed importance of checking blood sugars and taking logbook to MD OV for any needed adjustments.  Will demonstrate insulin pen in am as pt was very overwelmed with information overload. He seems motivated to control blood sugars. Will f/u in am.  Thank you. Lorenda Peck, RD, LDN, CDE Inpatient Diabetes Coordinator [phone]

## 2015-08-22 NOTE — Progress Notes (Signed)
Patient watched video 503 and 504. He feels over whelmed. Support and education given.

## 2015-08-22 NOTE — Progress Notes (Signed)
[place] TEAM 1 - Stepdown/ICU TEAM PROGRESS NOTE  BRYLAN DEC YNW:[id number] DOB: [birth year] DOA: 08/19/2015 PCP: [name], MD  Admit HPI / Brief Narrative: 60 year old male with a history of ulcerative colitis, chronic steroid use, osteoarthritis, and hypertension who presented with complaints of 2 weeks of generalized weakness with 30 pound weight loss, shortness of breath, nausea, loss of appetite, and urinary frequency. He endorsed a family history of his son being a type I diabetic. He recently had a flare of his ulcerative colitis for which he received a tapering steroid course.   Upon admission patient was found to have a blood glucose of 486, anion gap 21, CO2 14, beta-hydroxybutyric level >8. UA was positive for ketones and glucose. Venous pH was 7.17. Patient's initial chest x-ray showed no acute abnormalities.   HPI/Subjective: The patient has no new complaints.  He is anxious to be discharged home.  We had a very lengthy discussion about diabetes and general principles of diabetes management.  We discussed the serious nature of DKA and the possibility of it proving to be fatal if not managed and recognized acutely.  I advised the patient that I would like to see his CBGs consistently less than 200 prior to discharging him home.  Assessment/Plan:  DKA  Resolved - patient off insulin drip   Newly diagnosed DM A1c 13.2 - continue DM education - patient is very overwhelmed and a high risk for acutely recurrent DKA - I have counseled him as to the very serious nature of DKA in the possibility of a killing him - we will continue to adjust his insulin until his CBGs are consistently less than 200 - I have counseled him on absolute need to very carefully follow his CBGs as an outpatient  Acute kidney injury Creatinine at baseline appears to be less than 1 - elevated at 1.63 on admission - creatinine has now normalized  Ulcerative colitis with history of chronic steroid  use Continue mercaptopurine - no evidence of acute flair at this time   Pseudohyponatremia secondary to hyperglycemia - resolved  Obesity - Body mass index is 32.23 kg/(m^2).  Code Status: FULL Family Communication: wife present at bedside  Disposition Plan: SDU - possible discharge 24-48 hours  Consultants: none  Procedures: none  Antibiotics: none  DVT prophylaxis: lovenox   Objective: Blood pressure 113/77, pulse 81, temperature 98.5 F (36.9 C), temperature source Oral, resp. rate 16, height 5' 11"  (1.803 m), weight 104.781 kg (231 lb), SpO2 100 %.  Intake/Output Summary (Last 24 hours) at 08/22/15 1653 Last data filed at 08/22/15 1300  Gross per 24 hour  Intake    949 ml  Output   2450 ml  Net  -1501 ml   Exam: General: No acute respiratory distress - alert and conversant Lungs: Clear to auscultation bilaterally - no wheeze Cardiovascular: Regular rate and rhythm without murmur gallop or rub  Abdomen: Nontender, obese, soft, bowel sounds positive, no rebound, no ascites, no appreciable mass Extremities: No significant cyanosis, clubbing, or edema bilateral lower extremities  Data Reviewed:  Basic Metabolic Panel:  Recent Labs Lab 08/20/15 0745 08/20/15 1813 08/20/15 2204 08/21/15 0440 08/22/15 0451  NA 134* 136 135 138 136  K 3.4* 3.2* 3.7 3.5 3.5  CL 104 109 105 107 106  CO2 15* 18* 17* 19* 19*  GLUCOSE 198* 173* 147* 179* 295*  BUN 14 14 15 12 9   CREATININE 1.10 1.21 1.12 1.02 0.89  CALCIUM 8.9 9.2 9.2 9.1 8.8*  CBC:  Recent Labs Lab 08/18/15 1056 08/19/15 1326 08/20/15 0428 08/21/15 0440  WBC 4.7 3.4* 4.3 2.9*  NEUTROABS 2.7  --   --   --   HGB 14.6 14.8 10.1* 11.9*  HCT 43.1 42.5 28.9* 34.6*  MCV 91.0 89.5 86.3 87.2  PLT 374.0 332 275 230    Liver Function Tests:  Recent Labs Lab 08/18/15 1056 08/19/15 1857 08/21/15 0440  AST 11 20 13*  ALT 14 15* 15*  ALKPHOS 49 43 34*  BILITOT 0.7 2.0* 0.6  PROT 8.6* 7.4 6.1*   ALBUMIN 4.3 3.4* 2.6*    Recent Labs Lab 08/18/15 1056 08/19/15 1857 08/21/15 0440  LIPASE 347.0* 136* 87*  AMYLASE 172*  --   --    CBG:  Recent Labs Lab 08/21/15 2125 08/22/15 0027 08/22/15 0325 08/22/15 0753 08/22/15 1221  GLUCAP 390* 310* 278* 247* 309*    Recent Results (from the past 240 hour(s))  MRSA PCR Screening     Status: None   Collection Time: 08/20/15 12:51 AM  Result Value Ref Range Status   MRSA by PCR NEGATIVE NEGATIVE Final    Comment:        The GeneXpert MRSA Assay (FDA approved for NASAL specimens only), is one component of a comprehensive MRSA colonization surveillance program. It is not intended to diagnose MRSA infection nor to guide or monitor treatment for MRSA infections.      Studies:   Recent x-ray studies have been reviewed in detail by the Attending Physician  Scheduled Meds:  Scheduled Meds: . aspirin EC  81 mg Oral Daily  . enoxaparin (LOVENOX) injection  40 mg Subcutaneous Daily  . insulin aspart  0-20 Units Subcutaneous TID WC  . insulin aspart  0-5 Units Subcutaneous QHS  . insulin aspart  6 Units Subcutaneous TID WC  . insulin glargine  30 Units Subcutaneous QHS  . mercaptopurine  50 mg Oral Daily  . multivitamin with minerals  1 tablet Oral Daily  . omega-3 acid ethyl esters  1 g Oral Daily  . sodium chloride flush  3 mL Intravenous Q12H  . vitamin B-12  2,500 mcg Oral Daily  . vitamin C  1,000 mg Oral Daily    Time spent on care of this patient: 35 mins   Aiyanna Awtrey T , MD   [facility] Office  [phone] Pager - Text Page per Shea Evans as per below:  On-Call/Text Page:      [email or web address]      password [email or web address]  If 7PM-7AM, please contact night-coverage [email or web address] Password [email or web address] 08/22/2015, 4:53 PM   LOS: 3 days

## 2015-08-23 DIAGNOSIS — E1110 Type 2 diabetes mellitus with ketoacidosis without coma: Secondary | ICD-10-CM | POA: Insufficient documentation

## 2015-08-23 LAB — BASIC METABOLIC PANEL
ANION GAP: 9 (ref 5–15)
BUN: 7 mg/dL (ref 6–20)
CHLORIDE: 106 mmol/L (ref 101–111)
CO2: 24 mmol/L (ref 22–32)
Calcium: 9 mg/dL (ref 8.9–10.3)
Creatinine, Ser: 0.78 mg/dL (ref 0.61–1.24)
GFR calc Af Amer: >60 mL/min (ref >60)
GFR calc non Af Amer: >60 mL/min (ref >60)
Glucose, Bld: 148 mg/dL — ABNORMAL HIGH (ref 65–99)
POTASSIUM: 3.4 mmol/L — AB (ref 3.5–5.1)
Sodium: 139 mmol/L (ref 135–145)

## 2015-08-23 LAB — GLUCOSE, CAPILLARY
GLUCOSE-CAPILLARY: 118 mg/dL — AB (ref 65–99)
GLUCOSE-CAPILLARY: 234 mg/dL — AB (ref 65–99)
GLUCOSE-CAPILLARY: 234 mg/dL — AB (ref 65–99)
GLUCOSE-CAPILLARY: 230 mg/dL — AB (ref 65–99)

## 2015-08-23 NOTE — Progress Notes (Signed)
Difference between short acting and longer acting insulin education done along with self administration of insulin  Education by bedside RN.

## 2015-08-23 NOTE — Progress Notes (Signed)
CM received message that pt was worried about being able to afford insulin pens.  Met with pt and significant other @ bedside and provided cards for pharmaceutical patient assistance programs for both Lantus Solostar and Novolog pens.

## 2015-08-23 NOTE — Progress Notes (Signed)
[place] TEAM 1 - Stepdown/ICU TEAM Progress Note  Angel Frazier HQR:[id number] DOB: 1955-06-24 DOA: 08/19/2015 PCP: [name], MD  Admit HPI / Brief Narrative: 60 year old male BM PMHx ulcerative colitis, chronic steroid use, osteoarthritis, and hypertension who presented with complaints of 2 weeks of generalized weakness with 30 pound weight loss, shortness of breath, nausea, loss of appetite, and urinary frequency. He endorsed a family history of his son being a type I diabetic. He recently had a flare of his ulcerative colitis for which he received a tapering steroid course.   Upon admission patient was found to have a blood glucose of 486, anion gap 21, CO2 14, beta-hydroxybutyric level >8. UA was positive for ketones and glucose. Venous pH was 7.17. Patient's initial chest x-ray showed no acute abnormalities.   HPI/Subjective: 3/21 A/O 4, NAD. Patient still overwhelmed with process of administering medication, tracking CBG, recording food log.  Assessment/Plan: DKA  Resolved - patient off insulin drip   Newly diagnosed DM 3/17 Hemoglobin A1c =13.2  - continue DM education - patient is very overwhelmed and a high risk for acutely recurrent DKA - I have counseled him as to the very serious nature of DKA in the possibility of a killing him - we will continue to adjust his insulin until his CBGs are consistently less than 200 - I have counseled him on absolute need to very carefully follow his CBGs as an outpatient -CSW consult will insurance pain for insulin pens? -Patient did not have a good understanding of requirement for initially keeping logbook with CBG prior to each meal, prior to bedtime as well as food log. Discussed at length with patient and wife -Patient will practice overnight drawn and administering insulin as well as recording in his log book. -Should be ready for discharge on 3/22  Acute kidney injury Creatinine at baseline appears to be less than 1 - elevated at  1.63 on admission - creatinine has now normalized  Ulcerative colitis with history of chronic steroid use Continue mercaptopurine - no evidence of acute flair at this time   Pseudohyponatremia secondary to hyperglycemia - resolved  Obesity - Body mass index is 32.23 kg/(m^2).    Code Status: FULL Family Communication: no family present at time of exam Disposition Plan: Discharge on 3/22   Consultants:   Procedure/Significant Events:    Culture   Antibiotics:   DVT prophylaxis: Lovenox   Devices    LINES / TUBES:      Continuous Infusions:   Objective: VITAL SIGNS: Temp: 98.5 F (36.9 C) (03/21 1200) Temp Source: Oral (03/21 1200) BP: 142/95 mmHg (03/21 1200) Pulse Rate: 71 (03/21 1200) SPO2; FIO2:   Intake/Output Summary (Last 24 hours) at 08/23/15 1511 Last data filed at 08/23/15 1051  Gross per 24 hour  Intake    243 ml  Output    125 ml  Net    118 ml     Exam: General: A/O 4, NAD, No acute respiratory distress Eyes: Negative headache, negative scleral hemorrhage ENT: Negative Runny nose, negative gingival bleeding, Neck:  Negative scars, masses, torticollis, lymphadenopathy, JVD Lungs: Clear to auscultation bilaterally without wheezes or crackles Cardiovascular: Regular rate and rhythm without murmur gallop or rub normal S1 and S2 Abdomen:negative abdominal pain, nondistended, positive soft, bowel sounds, no rebound, no ascites, no appreciable mass Extremities: No significant cyanosis, clubbing, or edema bilateral lower extremities Psychiatric:  Negative depression, negative anxiety, negative fatigue, negative mania  Neurologic:  Cranial nerves II through XII intact,  tongue/uvula midline, all extremities muscle strength 5/5, sensation intact throughout, negative dysarthria, negative expressive aphasia, negative receptive aphasia.   Data Reviewed: Basic Metabolic Panel:  Recent Labs Lab 08/20/15 1813 08/20/15 2204 08/21/15 0440  08/22/15 0451 08/23/15 0213  NA 136 135 138 136 139  K 3.2* 3.7 3.5 3.5 3.4*  CL 109 105 107 106 106  CO2 18* 17* 19* 19* 24  GLUCOSE 173* 147* 179* 295* 148*  BUN 14 15 12 9 7   CREATININE 1.21 1.12 1.02 0.89 0.78  CALCIUM 9.2 9.2 9.1 8.8* 9.0   Liver Function Tests:  Recent Labs Lab 08/18/15 1056 08/19/15 1857 08/21/15 0440  AST 11 20 13*  ALT 14 15* 15*  ALKPHOS 49 43 34*  BILITOT 0.7 2.0* 0.6  PROT 8.6* 7.4 6.1*  ALBUMIN 4.3 3.4* 2.6*    Recent Labs Lab 08/18/15 1056 08/19/15 1857 08/21/15 0440  LIPASE 347.0* 136* 87*  AMYLASE 172*  --   --    No results for input(s): AMMONIA in the last 168 hours. CBC:  Recent Labs Lab 08/18/15 1056 08/19/15 1326 08/20/15 0428 08/21/15 0440  WBC 4.7 3.4* 4.3 2.9*  NEUTROABS 2.7  --   --   --   HGB 14.6 14.8 10.1* 11.9*  HCT 43.1 42.5 28.9* 34.6*  MCV 91.0 89.5 86.3 87.2  PLT 374.0 332 275 230   Cardiac Enzymes: No results for input(s): CKTOTAL, CKMB, CKMBINDEX, TROPONINI in the last 168 hours. BNP (last 3 results) No results for input(s): BNP in the last 8760 hours.  ProBNP (last 3 results) No results for input(s): PROBNP in the last 8760 hours.  CBG:  Recent Labs Lab 08/22/15 1221 08/22/15 1740 08/22/15 2121 08/23/15 0816 08/23/15 1245  GLUCAP 309* 253* 267* 118* 234*    Recent Results (from the past 240 hour(s))  MRSA PCR Screening     Status: None   Collection Time: 08/20/15 12:51 AM  Result Value Ref Range Status   MRSA by PCR NEGATIVE NEGATIVE Final    Comment:        The GeneXpert MRSA Assay (FDA approved for NASAL specimens only), is one component of a comprehensive MRSA colonization surveillance program. It is not intended to diagnose MRSA infection nor to guide or monitor treatment for MRSA infections.      Studies:  Recent x-ray studies have been reviewed in detail by the Attending Physician  Scheduled Meds:  Scheduled Meds: . aspirin EC  81 mg Oral Daily  . enoxaparin  (LOVENOX) injection  40 mg Subcutaneous Daily  . insulin aspart  0-20 Units Subcutaneous TID WC  . insulin aspart  0-5 Units Subcutaneous QHS  . insulin aspart  6 Units Subcutaneous TID WC  . insulin glargine  30 Units Subcutaneous QHS  . mercaptopurine  50 mg Oral Daily  . multivitamin with minerals  1 tablet Oral Daily  . omega-3 acid ethyl esters  1 g Oral Daily  . sodium chloride flush  3 mL Intravenous Q12H  . vitamin B-12  2,500 mcg Oral Daily  . vitamin C  1,000 mg Oral Daily    Time spent on care of this patient: 40 mins   Jacora Hopkins, Geraldo Docker , MD  [facility] Office  [phone] Pager [phone]  On-Call/Text Page:      [email or web address]      password [email or web address]  If 7PM-7AM, please contact night-coverage [email or web address] Password [email or web address] 08/23/2015, 3:11 PM   LOS: 4 days   Care during the described time interval  was provided by me .  I have reviewed this patient's available data, including medical history, events of note, physical examination, and all test results as part of my evaluation. I have personally reviewed and interpreted all radiology studies.   Dia Crawford, MD [phone] Pager

## 2015-08-24 ENCOUNTER — Telehealth: Payer: Self-pay | Admitting: Internal Medicine

## 2015-08-24 DIAGNOSIS — Z79899 Other long term (current) drug therapy: Secondary | ICD-10-CM

## 2015-08-24 DIAGNOSIS — Z7960 Long term (current) use of unspecified immunomodulators and immunosuppressants: Secondary | ICD-10-CM

## 2015-08-24 DIAGNOSIS — E1310 Other specified diabetes mellitus with ketoacidosis without coma: Principal | ICD-10-CM

## 2015-08-24 DIAGNOSIS — N179 Acute kidney failure, unspecified: Secondary | ICD-10-CM

## 2015-08-24 LAB — GLUCOSE, CAPILLARY
GLUCOSE-CAPILLARY: 120 mg/dL — AB (ref 65–99)
Glucose-Capillary: 253 mg/dL — ABNORMAL HIGH (ref 65–99)

## 2015-08-24 MED ORDER — UNABLE TO FIND: Status: DC

## 2015-08-24 MED ORDER — INSULIN GLARGINE 100 UNIT/ML SOLOSTAR PEN: 30.0000 [IU] | PEN_INJECTOR | Freq: Every day | SUBCUTANEOUS | Status: DC

## 2015-08-24 MED ORDER — INSULIN ASPART 100 UNIT/ML FLEXPEN: 8.0000 [IU] | PEN_INJECTOR | Freq: Three times a day (TID) | SUBCUTANEOUS | Status: DC

## 2015-08-24 NOTE — Progress Notes (Signed)
Inpatient Diabetes Program Recommendations  AACE/ADA: New Consensus Statement on Inpatient Glycemic Control (2015)  Target Ranges:  Prepandial:   less than 140 mg/dL      Peak postprandial:   less than 180 mg/dL (1-2 hours)      Critically ill patients:  140 - 180 mg/dL   Review of Glycemic Control   Results for ALEPH, NICKSON (MRN [id number]) as of 08/24/2015 13:03  Ref. Range 08/24/2015 07:05 08/24/2015 11:42  Glucose-Capillary Latest Ref Range: 65-99 mg/dL 120 (H) 253 (H)   Educated patient on insulin pen use at home. Reviewed contents of insulin flexpen starter kit. Reviewed all steps if insulin pen including attachment of needle, 2-unit air shot, dialing up dose, giving injection, removing needle, disposal of sharps, storage of unused insulin, disposal of insulin etc. Patient able to provide successful return demonstration. Also reviewed troubleshooting with insulin pen. MD to give patient Rxs for insulin pens and insulin pen needles.  Discharging home on Lantus 30 units QHS and Novolog 8 units tidwc.  F/U with PCP within a week with blood sugar log. OP Diabetes Education consult ordered.  Thank you. Lorenda Peck, RD, LDN, CDE Inpatient Diabetes Coordinator [phone]

## 2015-08-24 NOTE — Care Management (Signed)
Benefits check for coverage for insulin pens was completed. Patient's co-pay will be $35.00, Case manager provided this information to the patient. No further case management needs identified.

## 2015-08-24 NOTE — Discharge Summary (Signed)
Physician Discharge Summary  Angel Frazier QZR:[id number] DOB: [birth year] DOA: 08/19/2015  PCP: [name], MD  Admit date: 08/19/2015 Discharge date: 08/24/2015  Time spent: *25 minutes  Recommendations for Outpatient Follow-up:  Follow PCP in 2 weeks Follow-up GI outpatient in 2 weeks   Discharge Diagnoses:  Principal Problem:   Diabetic ketoacidosis ([place]) Active Problems:   LEFT SIDED ULCERATIVE COLITIS   Long term current use of systemic steroids   Acute kidney injury ([place])   Diabetic ketoacidosis without coma ([place])   Discharge Condition: Stable  Diet recommendation: Carb modified diet  Filed Weights   08/20/15 0000 08/23/15 0417  Weight: 104.781 kg (231 lb) 109 kg (240 lb 4.8 oz)    History of present illness:  60 year old male with a history of ulcerative colitis, chronic steroid use, osteoarthritis, and hypertension who presented with complaints of 2 weeks of generalized weakness with 30 pound weight loss, shortness of breath, nausea, loss of appetite, and urinary frequency. He endorsed a family history of his son being a type I diabetic. He recently had a flare of his ulcerative colitis for which he received a tapering steroid course.   Hospital Course:  DKA  Resolved - patient off insulin drip   Newly diagnosed DM A1c 13.2 - continue DM education - patient is very overwhelmed and a high risk for acutely recurrent DKA -patient has seen diabetic coordinator. At this time started on Lantus 30 units subcutaneous daily along with NovoLog FlexPen 8 units TID with meals.  Acute kidney injury Creatinine at baseline appears to be less than 1 - elevated at 1.63 on admission - creatinine has now normalized  Ulcerative colitis with history of chronic steroid use Patient does not take medications for ulcerative colitis at this time. Wants to call up his GI as outpatient for further recommendations  Pseudohyponatremia secondary to hyperglycemia -  resolved   Procedures:  None  Consultations:  None  Discharge Exam: Filed Vitals:   08/24/15 0000 08/24/15 0432  BP: 127/87 138/99  Pulse: 70 78  Temp: 98 F (36.7 C) 97.7 F (36.5 C)  Resp: 18 18    General: Appears in no acute distress Cardiovascular: S1-S2 regular Respiratory: Clear to auscultation bilaterally  Discharge Instructions   Discharge Instructions    Ambulatory referral to Nutrition and Diabetic Education    Complete by:  As directed   New-onset DM A1C=13.2     Diet Carb Modified    Complete by:  As directed      Increase activity slowly    Complete by:  As directed           Current Discharge Medication List    START taking these medications   Details  insulin aspart (NOVOLOG) 100 UNIT/ML FlexPen Inject 8 Units into the skin 3 (three) times daily with meals. Qty: 15 mL, Refills: 11    Insulin Glargine (LANTUS) 100 UNIT/ML Solostar Pen Inject 30 Units into the skin daily at 10 pm. Qty: 15 mL, Refills: 11    UNABLE TO FIND Outpatient OT Qty: 1 each, Refills: 0      CONTINUE these medications which have NOT CHANGED   Details  Ascorbic Acid (VITAMIN C) 1000 MG tablet Take 1,000 mg by mouth daily.    aspirin 81 MG tablet Take 81 mg by mouth daily.      Calcium Carbonate-Vitamin D (CALCIUM-VITAMIN D3 PO) Take 1 tablet by mouth daily. Calcium 1200 vit d3 plus    Cholecalciferol (VITAMIN D3) 50000 UNITS CAPS  Take 1 tablet by mouth once a week. Qty: 6 capsule, Refills: 0    cyclobenzaprine (FLEXERIL) 10 MG tablet Take 10 mg by mouth 3 (three) times daily as needed for muscle spasms.    mesalamine (CANASA) 1000 MG suppository Place 1 suppository (1,000 mg total) rectally at bedtime as needed. Qty: 30 suppository, Refills: 12   Associated Diagnoses: Left sided ulcerative (chronic) colitis (HCC)    METHOCARBAMOL PO Take 500 mg by mouth daily as needed (for muscle spasms).     Multiple Vitamins-Minerals (CENTRUM SILVER PO) Take 1 tablet by  mouth daily.    Omega-3 Fatty Acids (FISH OIL) 300 MG CAPS Take 300 mg by mouth daily.     oxyCODONE-acetaminophen (PERCOCET/ROXICET) 5-325 MG tablet Take 2 tablets by mouth every 4 (four) hours as needed for severe pain. Qty: 20 tablet, Refills: 0    vitamin B-12 (CYANOCOBALAMIN) 1000 MCG tablet Take 2,500 mcg by mouth daily.        No Known Allergies    The results of significant diagnostics from this hospitalization (including imaging, microbiology, ancillary and laboratory) are listed below for reference.    Significant Diagnostic Studies: Dg Chest 2 View  08/19/2015  CLINICAL DATA:  Loss of appetite EXAM: CHEST  2 VIEW COMPARISON:  01/16/2010 FINDINGS: The heart size and mediastinal contours are within normal limits. Both lungs are clear. The visualized skeletal structures are unremarkable. IMPRESSION: No active cardiopulmonary disease. Electronically Signed   By: Inez Catalina M.D.   On: 08/19/2015 18:05   Ct Abdomen Pelvis W Contrast  08/19/2015  CLINICAL DATA:  Elevated LFTs and elevated lipase and amylase EXAM: CT ABDOMEN AND PELVIS WITH CONTRAST TECHNIQUE: Multidetector CT imaging of the abdomen and pelvis was performed using the standard protocol following bolus administration of intravenous contrast. CONTRAST:  41m OMNIPAQUE IOHEXOL 300 MG/ML  SOLN COMPARISON:  None. FINDINGS: Lung bases demonstrated 8 mm nodule in the posterior right lower lobe on the first image. It is incompletely evaluated. Some vague nodular changes are noted in the right middle lobe also on the first image and incompletely evaluated. A few scattered smaller nodules are noted in the left base. The liver, gallbladder, spleen, adrenal glands and pancreas are within normal limits. The kidneys are well visualized bilaterally without renal calculi or urinary tract obstructive changes. The bladder is well distended. Prostate is within normal limits. No pelvic mass lesion is seen. The appendix is within normal limits.  No acute bony abnormality is noted. IMPRESSION: No findings to correspond with the given clinical history. Multiple pulmonary nodules are noted. The largest of these measures 8 mm in the right lower lobe. These can be further evaluated on a nonemergent basis. Electronically Signed   By: MInez CatalinaM.D.   On: 08/19/2015 21:24    Microbiology: Recent Results (from the past 240 hour(s))  MRSA PCR Screening     Status: None   Collection Time: 08/20/15 12:51 AM  Result Value Ref Range Status   MRSA by PCR NEGATIVE NEGATIVE Final    Comment:        The GeneXpert MRSA Assay (FDA approved for NASAL specimens only), is one component of a comprehensive MRSA colonization surveillance program. It is not intended to diagnose MRSA infection nor to guide or monitor treatment for MRSA infections.      Labs: Basic Metabolic Panel:  Recent Labs Lab 08/20/15 1813 08/20/15 2204 08/21/15 0440 08/22/15 0451 08/23/15 0213  NA 136 135 138 136 139  K 3.2* 3.7  3.5 3.5 3.4*  CL 109 105 107 106 106  CO2 18* 17* 19* 19* 24  GLUCOSE 173* 147* 179* 295* 148*  BUN 14 15 12 9 7   CREATININE 1.21 1.12 1.02 0.89 0.78  CALCIUM 9.2 9.2 9.1 8.8* 9.0   Liver Function Tests:  Recent Labs Lab 08/18/15 1056 08/19/15 1857 08/21/15 0440  AST 11 20 13*  ALT 14 15* 15*  ALKPHOS 49 43 34*  BILITOT 0.7 2.0* 0.6  PROT 8.6* 7.4 6.1*  ALBUMIN 4.3 3.4* 2.6*    Recent Labs Lab 08/18/15 1056 08/19/15 1857 08/21/15 0440  LIPASE 347.0* 136* 87*  AMYLASE 172*  --   --    No results for input(s): AMMONIA in the last 168 hours. CBC:  Recent Labs Lab 08/18/15 1056 08/19/15 1326 08/20/15 0428 08/21/15 0440  WBC 4.7 3.4* 4.3 2.9*  NEUTROABS 2.7  --   --   --   HGB 14.6 14.8 10.1* 11.9*  HCT 43.1 42.5 28.9* 34.6*  MCV 91.0 89.5 86.3 87.2  PLT 374.0 332 275 230    CBG:  Recent Labs Lab 08/23/15 1245 08/23/15 1645 08/23/15 2144 08/24/15 0705 08/24/15 1142  GLUCAP 234* 234* 230* 120* 253*        Signed:  Eleonore Chiquito S MD.  [facility] 08/24/2015, 12:23 PM

## 2015-08-24 NOTE — Progress Notes (Signed)
Pt drawn and administered insulin successfully with RN supervision. Education was given on the importance of hand hygiene before administering insulin.

## 2015-08-25 NOTE — Telephone Encounter (Signed)
Patient with new diagnosis of diabetes.  He is now on insulin.  Should he resume his mercaptopurine?  He is feeling some what better.  He is having some problems now with his hemorrhoids. "swelling".  He denies bleeding, diarrhea, or other complaints.

## 2015-08-26 NOTE — Telephone Encounter (Signed)
Renew 35m and get amylade, lipase cmet and cbc 1-2 weeks after  I think his problems were the diabetes and not reaction to 6 MP so we need to reasses w/ Tx

## 2015-08-26 NOTE — Telephone Encounter (Signed)
Patient notified  He will come for labs next Friday.

## 2015-08-31 ENCOUNTER — Encounter: Payer: Self-pay | Admitting: Family Medicine

## 2015-08-31 ENCOUNTER — Ambulatory Visit (INDEPENDENT_AMBULATORY_CARE_PROVIDER_SITE_OTHER): Payer: BLUE CROSS/BLUE SHIELD | Admitting: Family Medicine

## 2015-08-31 VITALS — BP 114/90 | HR 94 | Temp 98.5°F | Wt 245.0 lb

## 2015-08-31 DIAGNOSIS — E1165 Type 2 diabetes mellitus with hyperglycemia: Secondary | ICD-10-CM | POA: Diagnosis not present

## 2015-08-31 DIAGNOSIS — E1310 Other specified diabetes mellitus with ketoacidosis without coma: Secondary | ICD-10-CM

## 2015-08-31 DIAGNOSIS — E1110 Type 2 diabetes mellitus with ketoacidosis without coma: Secondary | ICD-10-CM

## 2015-08-31 DIAGNOSIS — E119 Type 2 diabetes mellitus without complications: Secondary | ICD-10-CM

## 2015-08-31 DIAGNOSIS — Z794 Long term (current) use of insulin: Secondary | ICD-10-CM | POA: Diagnosis not present

## 2015-08-31 MED ORDER — METFORMIN HCL 500 MG PO TABS: 500.0000 mg | ORAL_TABLET | Freq: Two times a day (BID) | ORAL | Status: DC

## 2015-08-31 NOTE — Patient Instructions (Signed)
Diabetes Morning- continue 8 units novolog, take metformin 515m after breakfast Lunch- continue 8 units novolog Dinner- increase to 10 units novolog  Before bed- continue 30 units of Lantus      I sent in metformin to be taken twice a day but we will add second dose likely at next visit. Just take in the morning for now. I am starting you on a low dose and it may not change your blood sugars much.  Keep logging blood sugar  Glad you are going to diabetes nutrition  See me back in 3-4 weeks.   If any sugar below 70 let me know as soon as possible  Hypoglycemia Low blood sugar (hypoglycemia) means that the level of sugar in your blood is lower than it should be. Signs of low blood sugar include:  Getting sweaty.  Feeling hungry.  Feeling dizzy or weak.  Feeling sleepier than normal.  Feeling nervous.  Headaches.  Having a fast heartbeat. Low blood sugar can happen fast and can be an emergency. Your doctor can do tests to check your blood sugar level. You can have low blood sugar and not have diabetes. HOME CARE  Check your blood sugar as told by your doctor. If it is less than 70 mg/dl or as told by your doctor, take 1 of the following:  3 to 4 glucose tablets.   cup clear juice.   cup soda pop, not diet.  1 cup milk.  5 to 6 hard candies.  Recheck blood sugar after 15 minutes. Repeat until it is at the right level.  Eat a snack if it is more than 1 hour until the next meal.  Only take medicine as told by your doctor.  Do not skip meals. Eat on time.  Do not drink alcohol except with meals.  Check your blood glucose before driving.  Check your blood glucose before and after exercise.  Always carry treatment with you, such as glucose pills.  Always wear a medical alert bracelet if you have diabetes. GET HELP RIGHT AWAY IF:   Your blood glucose goes below 70 mg/dl or as told by your doctor, and you:  Are confused.  Are not able to  swallow.  Pass out (faint).  You cannot treat yourself. You may need someone to help you.  You have low blood sugar problems often.  You have problems from your medicines.  You are not feeling better after 3 to 4 days.  You have vision changes. MAKE SURE YOU:   Understand these instructions.  Will watch this condition.  Will get help right away if you are not doing well or get worse.   This information is not intended to replace advice given to you by your health care provider. Make sure you discuss any questions you have with your health care provider.   Document Released: 08/15/2009 Document Revised: 06/11/2014 Document Reviewed: 01/25/2015 Elsevier Interactive Patient Education 2[employer]

## 2015-08-31 NOTE — Assessment & Plan Note (Signed)
S: Hospital follow up for DKA. History hyperglycemia since 2012 at least. Had been on steroids for ulcerative colitis flare and sharp uptick recently in cbg from 139 to >400 despite coming off of steroids. Hospitalized from 08/19/15 to 08/24/15 for DKA- he had 2 weeks of weakness, 30 lb weigh tloss, shortness of breath, nausea, lack of appetite and severe polyuria.   Treated with insulin drip and later transitioned to 30 units lantus in evenings with novolog 8 units TID with meals. Saw diabetic coordinator and will be seeing diabetes education on 11th of April. He has done well on home regimen. He is gaining weight back after prior fluid losses. He also had AkI in hospitali which resolved before discharge.   Checking sugars 4x a day in Am, before lunch, before dinner, before bed 23rd- 146, 273, 272, 274 24th-80, 156, 222, 165, 326 25th-109,152, 313, 363 26th-111, 247, 286, 341 27th-130,179, 152, 202 28th-150,271, 195, 211 29th-98.   A/P: doing incredibly well for new onset diabetes. Suspect this is type II diabetes after review of prior CBGs but drastic worsening recently as well as DKA concerning for type I. Add metformin 537m in AM and may add the other dose in evening next visit. Continue 30 units lantus and 8 units each meal but increase to 10 before dinner given before bed readings 211-363. Discussed range goal 80-180. Any below 70 to let me know immediately. Will follow up 3-4 weeks for further adjustments. Wonder if in long run has potential to transition to orals but really unclear at present.

## 2015-08-31 NOTE — Progress Notes (Signed)
[name], MD  Subjective:  Angel Frazier is a 60 y.o. year old very pleasant male patient who presents for/with See problem oriented charting ROS- no fever, chills, nausea, vomiting. Polyuria has largely resolved. Thirst much lower.   Past Medical History-  Patient Active Problem List   Diagnosis Date Noted  . Uncontrolled diabetes mellitus ([place]) 08/31/2015    Priority: High  . DKA (diabetic ketoacidoses) ([place]) 08/19/2015    Priority: High  . Long-term use of immunosuppressant medication - 6 MP 07/12/2015    Priority: High  . Long term current use of systemic steroids 11/18/2014    Priority: High  . LEFT SIDED ULCERATIVE COLITIS 08/11/2008    Priority: High  . Avascular necrosis of femoral heads 11/18/2014    Priority: Low  . Hemorrhoids, internal, with bleeding 06/22/2014    Priority: Low  . Vitamin D deficiency 01/24/2013    Priority: Low  . ED (erectile dysfunction) 05/21/2012    Priority: Low    Medications- reviewed and updated Current Outpatient Prescriptions  Medication Sig Dispense Refill  . Ascorbic Acid (VITAMIN C) 1000 MG tablet Take 1,000 mg by mouth daily.    Marland Kitchen aspirin 81 MG tablet Take 81 mg by mouth daily.      . Calcium Carbonate-Vitamin D (CALCIUM-VITAMIN D3 PO) Take 1 tablet by mouth daily. Calcium 1200 vit d3 plus    . Cholecalciferol (VITAMIN D3) 50000 UNITS CAPS Take 1 tablet by mouth once a week. (Patient taking differently: Take 1 tablet by mouth every Monday. ) 6 capsule 0  . insulin aspart (NOVOLOG) 100 UNIT/ML FlexPen Inject 8 Units into the skin 3 (three) times daily with meals. 15 mL 11  . Insulin Glargine (LANTUS) 100 UNIT/ML Solostar Pen Inject 30 Units into the skin daily at 10 pm. 15 mL 11  . mesalamine (CANASA) 1000 MG suppository Place 1 suppository (1,000 mg total) rectally at bedtime as needed. 30 suppository 12  . METHOCARBAMOL PO Take 500 mg by mouth daily as needed (for muscle spasms).     . Multiple Vitamins-Minerals (CENTRUM SILVER  PO) Take 1 tablet by mouth daily.    . Omega-3 Fatty Acids (FISH OIL) 300 MG CAPS Take 300 mg by mouth daily.     Marland Kitchen UNABLE TO FIND Outpatient OT 1 each 0  . vitamin B-12 (CYANOCOBALAMIN) 1000 MCG tablet Take 2,500 mcg by mouth daily.     . cyclobenzaprine (FLEXERIL) 10 MG tablet Take 10 mg by mouth 3 (three) times daily as needed for muscle spasms. Reported on 08/31/2015    . metFORMIN (GLUCOPHAGE) 500 MG tablet Take 1 tablet (500 mg total) by mouth 2 (two) times daily with a meal. 180 tablet 3  . oxyCODONE-acetaminophen (PERCOCET/ROXICET) 5-325 MG tablet Take 2 tablets by mouth every 4 (four) hours as needed for severe pain. (Patient not taking: Reported on 08/31/2015) 20 tablet 0   No current facility-administered medications for this visit.    Objective: BP 114/90 mmHg  Pulse 94  Temp(Src) 98.5 F (36.9 C)  Wt 245 lb (111.131 kg) Gen: NAD, resting comfortably CV: RRR no murmurs rubs or gallops Lungs: CTAB no crackles, wheeze, rhonchi Abdomen: soft/nontender/nondistended/normal bowel sounds. No rebound or guarding.  Ext: no edema Skin: warm, dry Neuro: grossly normal, moves all extremities  Assessment/Plan:  Uncontrolled diabetes mellitus ([place]) S: Hospital follow up for DKA. History hyperglycemia since 2012 at least. Had been on steroids for ulcerative colitis flare and sharp uptick recently in cbg from 139 to >400  despite coming off of steroids. Hospitalized from 08/19/15 to 08/24/15 for DKA- he had 2 weeks of weakness, 30 lb weigh tloss, shortness of breath, nausea, lack of appetite and severe polyuria.   Treated with insulin drip and later transitioned to 30 units lantus in evenings with novolog 8 units TID with meals. Saw diabetic coordinator and will be seeing diabetes education on 11th of April. He has done well on home regimen. He is gaining weight back after prior fluid losses. He also had AkI in hospitali which resolved before discharge.   Checking sugars 4x a day in Am, before  lunch, before dinner, before bed 23rd- 146, 273, 272, 274 24th-80, 156, 222, 165, 326 25th-109,152, 313, 363 26th-111, 247, 286, 341 27th-130,179, 152, 202 28th-150,271, 195, 211 29th-98.   A/P: doing incredibly well for new onset diabetes. Suspect this is type II diabetes after review of prior CBGs but drastic worsening recently as well as DKA concerning for type I. Add metformin 534m in AM and may add the other dose in evening next visit. Continue 30 units lantus and 8 units each meal but increase to 10 before dinner given before bed readings 211-363. Discussed range goal 80-180. Any below 70 to let me know immediately. Will follow up 3-4 weeks for further adjustments. Wonder if in long run has potential to transition to orals but really unclear at present.   3-4 weeks. Return precautions advised.   Meds ordered this encounter  Medications  . metFORMIN (GLUCOPHAGE) 500 MG tablet    Sig: Take 1 tablet (500 mg total) by mouth 2 (two) times daily with a meal.    Dispense:  180 tablet    Refill:  3   The duration of face-to-face time during this visit was 30 minutes. Greater than 50% of this time was spent in counseling, explanation of diagnosis, planning of further management, and/or coordination of care.

## 2015-09-01 LAB — HM DIABETES EYE EXAM

## 2015-09-02 ENCOUNTER — Other Ambulatory Visit (INDEPENDENT_AMBULATORY_CARE_PROVIDER_SITE_OTHER): Payer: BLUE CROSS/BLUE SHIELD

## 2015-09-02 DIAGNOSIS — Z79899 Other long term (current) drug therapy: Secondary | ICD-10-CM | POA: Diagnosis not present

## 2015-09-02 DIAGNOSIS — Z7960 Long term (current) use of unspecified immunomodulators and immunosuppressants: Secondary | ICD-10-CM

## 2015-09-02 LAB — CBC WITH DIFFERENTIAL/PLATELET
BASOS ABS: 0 10*3/uL (ref 0.0–0.1)
Basophils Relative: 0.4 % (ref 0.0–3.0)
EOS ABS: 0.3 10*3/uL (ref 0.0–0.7)
Eosinophils Relative: 8.4 % — ABNORMAL HIGH (ref 0.0–5.0)
HEMATOCRIT: 36 % — AB (ref 39.0–52.0)
HEMOGLOBIN: 12.1 g/dL — AB (ref 13.0–17.0)
LYMPHS PCT: 35.8 % (ref 12.0–46.0)
Lymphs Abs: 1.3 10*3/uL (ref 0.7–4.0)
MCHC: 33.7 g/dL (ref 30.0–36.0)
MCV: 91.6 fl (ref 78.0–100.0)
Monocytes Absolute: 0.4 10*3/uL (ref 0.1–1.0)
Monocytes Relative: 11.8 % (ref 3.0–12.0)
Neutro Abs: 1.5 10*3/uL (ref 1.4–7.7)
Neutrophils Relative %: 43.6 % (ref 43.0–77.0)
Platelets: 251 10*3/uL (ref 150.0–400.0)
RBC: 3.93 Mil/uL — ABNORMAL LOW (ref 4.22–5.81)
RDW: 12.9 % (ref 11.5–15.5)
WBC: 3.6 10*3/uL — AB (ref 4.0–10.5)

## 2015-09-02 LAB — COMPREHENSIVE METABOLIC PANEL
ALT: 16 U/L (ref 0–53)
AST: 13 U/L (ref 0–37)
Albumin: 3.9 g/dL (ref 3.5–5.2)
Alkaline Phosphatase: 34 U/L — ABNORMAL LOW (ref 39–117)
BUN: 10 mg/dL (ref 6–23)
CHLORIDE: 100 meq/L (ref 96–112)
CO2: 31 meq/L (ref 19–32)
CREATININE: 1.04 mg/dL (ref 0.40–1.50)
Calcium: 9.7 mg/dL (ref 8.4–10.5)
GFR: 93.24 mL/min (ref >60.00)
Glucose, Bld: 184 mg/dL — ABNORMAL HIGH (ref 70–99)
POTASSIUM: 3.7 meq/L (ref 3.5–5.1)
SODIUM: 138 meq/L (ref 135–145)
TOTAL PROTEIN: 7.5 g/dL (ref 6.0–8.3)
Total Bilirubin: 0.6 mg/dL (ref 0.2–1.2)

## 2015-09-02 LAB — AMYLASE: Amylase: 262 U/L — ABNORMAL HIGH (ref 27–131)

## 2015-09-02 LAB — LIPASE: LIPASE: 825 U/L — AB (ref 11.0–59.0)

## 2015-09-02 NOTE — Progress Notes (Signed)
Quick Note:  Lipase and amylase are up so 6 MP appears to be causing pancreatitis - stop that please Will discuss more at f/u ______

## 2015-09-05 ENCOUNTER — Encounter: Payer: Self-pay | Admitting: Family Medicine

## 2015-09-07 ENCOUNTER — Other Ambulatory Visit (INDEPENDENT_AMBULATORY_CARE_PROVIDER_SITE_OTHER): Payer: BLUE CROSS/BLUE SHIELD

## 2015-09-07 ENCOUNTER — Telehealth: Payer: Self-pay | Admitting: Internal Medicine

## 2015-09-07 ENCOUNTER — Encounter: Payer: Self-pay | Admitting: Internal Medicine

## 2015-09-07 ENCOUNTER — Ambulatory Visit (INDEPENDENT_AMBULATORY_CARE_PROVIDER_SITE_OTHER): Payer: BLUE CROSS/BLUE SHIELD | Admitting: Internal Medicine

## 2015-09-07 VITALS — BP 146/100 | HR 80 | Ht 71.0 in | Wt 240.8 lb

## 2015-09-07 DIAGNOSIS — K5150 Left sided colitis without complications: Secondary | ICD-10-CM

## 2015-09-07 DIAGNOSIS — K853 Drug induced acute pancreatitis without necrosis or infection: Secondary | ICD-10-CM

## 2015-09-07 DIAGNOSIS — R918 Other nonspecific abnormal finding of lung field: Secondary | ICD-10-CM | POA: Insufficient documentation

## 2015-09-07 HISTORY — DX: Drug induced acute pancreatitis without necrosis or infection: K85.30

## 2015-09-07 LAB — LIPASE: Lipase: 440 U/L — ABNORMAL HIGH (ref 11.0–59.0)

## 2015-09-07 LAB — AMYLASE: AMYLASE: 207 U/L — AB (ref 27–131)

## 2015-09-07 MED ORDER — MESALAMINE 1.2 G PO TBEC: 2.4000 g | DELAYED_RELEASE_TABLET | Freq: Two times a day (BID) | ORAL | Status: DC

## 2015-09-07 NOTE — Patient Instructions (Addendum)
  Your physician has requested that you go to the basement for lab work before leaving today.   Go back on your Lialda per Dr Carlean Purl.    You have been scheduled for a CT scan of the abdomen and pelvis at [facility] ([address]---this is in the same building as Press [facility]).   You are scheduled on 09/21/15 at 8:30AM. You should arrive 15 minutes prior to your appointment time for registration. Please follow the written instructions below on the day of your exam:   You may take any medications as prescribed with a small amount of water except for the following: Metformin, Glucophage, Glucovance, Avandamet, Riomet, Fortamet, Actoplus Met, Janumet, Glumetza or Metaglip. The above medications must be held the day of the exam AND 48 hours after the exam.  This test typically takes 30-45 minutes to complete.  If you have any questions regarding your exam or if you need to reschedule, you may call the CT department at [phone] between the hours of 8:00 am and 5:00 pm, Monday-Friday.  ________________________________________________________________________  I appreciate the opportunity to care for you.

## 2015-09-07 NOTE — Progress Notes (Signed)
   Subjective:    Patient ID: Angel Frazier, male    DOB: 06-11-1955, 60 y.o.   MRN: [id number] Chief complaint: Follow-up ulcerative colitis, problems with 6 mercaptopurine HPI Angel Frazier returns for follow-up. He's had a lot of problems lately, he was diagnosed with diabetes mellitus out of control, and his pancreatic enzymes were high. I was not sure thank reticulocyte enzymes were high because of his diabetes he had, he asked to get admitted with hyperglycemia and is now on insulin, or if it was due to the 6-MP. The 6-MP was restarted and his pancreatic enzymes rose again so I stopped it recently. He is not on any therapy for his colitis he denies diarrhea or bleeding or abdominal pain at this time. CT scanning of the abdomen and pelvis on 08/19/2015 did not demonstrate any pancreatic or other significant abnormalities. He still has a lot of neck problems he is having paresthesias in the second and third digits of his right hand and is due for injection into the cervical spine. He is still unable to work. He remains on disability. CT scan of the abdomen and pelvis didn't demonstrate some pulmonary nodules however have not been followed up on yet. Medications, allergies, past medical history, past surgical history, family history and social history are reviewed and updated in the EMR.  Review of Systems As above    Objective:   Physical Exam @BP  146/100 mmHg  Pulse 80  Ht 5' 11"  (1.803 m)  Wt 240 lb 12.8 oz (109.226 kg)  BMI 33.60 kg/m2@  General:  NAD Eyes:   anicteric Lungs:  clear Heart:: S1S2 no rubs, murmurs or gallops Abdomen:  soft and nontender, BS+ Ext:   no edema, cyanosis or clubbing    Data Reviewed:  As per history of present illness     Assessment & Plan:  Left sided ulcerative colitis, without complications (HCC)  Drug-induced acute pancreatitis  Pulmonary nodules  CT chestTo evaluate the pulmonary nodules quantiferon testing as he might need biologic therapy he had  an indeterminate QuantiFERON when he was on prednisone last Amylase and lipase are rechecked in these are actually improving Resume Lialda He is doing well at this time with respect to his colitis but given his history of the last year or so and need for prednisone he may need a biologic. I will arrange follow-up after the above is complete.

## 2015-09-07 NOTE — Telephone Encounter (Signed)
I spoke with Stacy at CT she will contact patient to help him reschedule.  I left a message for Mr Hoback also with Stacy's contact numbers.

## 2015-09-08 NOTE — Progress Notes (Signed)
Quick Note:  Pancreas tests still elevated but better Will need to recheck later - will plan to do after CT chest done ______

## 2015-09-09 LAB — QUANTIFERON TB GOLD ASSAY (BLOOD)
INTERFERON GAMMA RELEASE ASSAY: NEGATIVE
Mitogen-Nil: 5.99 IU/mL
QUANTIFERON TB AG MINUS NIL: 0.01 [IU]/mL
Quantiferon Nil Value: 0.04 IU/mL

## 2015-09-13 ENCOUNTER — Encounter: Payer: BLUE CROSS/BLUE SHIELD | Attending: Family Medicine | Admitting: *Deleted

## 2015-09-13 ENCOUNTER — Encounter: Payer: Self-pay | Admitting: *Deleted

## 2015-09-13 VITALS — Ht 71.5 in | Wt 241.5 lb

## 2015-09-13 DIAGNOSIS — E1310 Other specified diabetes mellitus with ketoacidosis without coma: Secondary | ICD-10-CM | POA: Insufficient documentation

## 2015-09-13 DIAGNOSIS — E119 Type 2 diabetes mellitus without complications: Secondary | ICD-10-CM

## 2015-09-13 NOTE — Patient Instructions (Signed)
Plan:  Aim for 45-60 Carb Choices per meal (3-4 grams) +/- 1 either way  Aim for 0-15 Carbs per snack if hungry  Include protein in moderation with your meals and snacks Consider reading food labels for Total Carbohydrate and Fat Grams of foods Consider  increasing your activity level by going to the gym for 30-45 minutes daily as tolerated Continue checking BG at alternate times per day as directed by MD  Conrinue taking medication as directed by MD  Consider Romona Curls Protein Bars Kind Bars Light & Fit GREEK Yogurt Fruits & nuts/ fruit & cheese  Eat breakfast and take insulin before going to gym Peanut butter & jelly (sugar free) [place] & cheese sandwich After working out out a protein/meal replacement shake  Always carry a bottle of water, snack and glucometer with you

## 2015-09-13 NOTE — Progress Notes (Signed)
Diabetes Self-Management Education  Visit Type: First/Initial  Appt. Start Time: 1030 Appt. End Time: 1200  09/13/2015  [name], identified by name and date of birth, is a 60 y.o. male with a diagnosis of Diabetes: Type 2. Angel Frazier presents with his wife Angel Frazier. Angel Frazier has a new diagnosis of T2DM and was recently hospitalized. He is adjusting well to the medication and modifying his dietary intake. He used to be a Agricultural engineer" prior to diagnosis. We discussed how to manage his dietary intake, insulin and food in order to get back into the gym. He is in the process of trying to obtain disability due to some physical challenges as well. His son has T1DM. Angel Frazier does the grocery shopping and food preparation.  ASSESSMENT  Height 5' 11.5" (1.816 m), weight 241 lb 8 oz (109.544 kg). Body mass index is 33.22 kg/(m^2).      Diabetes Self-Management Education - 09/13/15 1102    Visit Information   Visit Type First/Initial   Initial Visit   Diabetes Type Type 2   Are you currently following a meal plan? No   Are you taking your medications as prescribed? Yes   Date Diagnosed 08/19/15   Health Coping   How would you rate your overall health? Good   Psychosocial Assessment   Patient Belief/Attitude about Diabetes Motivated to manage diabetes   Self-care barriers None   Self-management support Doctor's office;Family;CDE visits   Other persons present Patient;Spouse/SO   Patient Concerns Nutrition/Meal planning;Medication;Monitoring;Healthy Lifestyle;Glycemic Control   Special Needs None   Preferred Learning Style No preference indicated   Learning Readiness Change in progress   How often do you need to have someone help you when you read instructions, pamphlets, or other written materials from your doctor or pharmacy? 3 - Sometimes   Complications   Last HgB A1C per patient/outside source 13.2 %   How often do you check your blood sugar? 3-4 times/day   Fasting Blood glucose range  (mg/dL) 70-129   Number of hypoglycemic episodes per month 0  lowest 75m/dl asymptomatic   Have you had a dilated eye exam in the past 12 months? No   Have you had a dental exam in the past 12 months? Yes   Are you checking your feet? Yes   How many days per week are you checking your feet? 7   Dietary Intake   Breakfast 2 eggs, bacon/turkey sausage, oatmeal, 1/2 slice toast, water   Snack (morning) apple   Lunch tKuwaitbreast sandwich wheat bread, lettuce, tomato, cheese, chips, water   Snack (afternoon) banana/sugar free jello   Dinner baked chicken, lima beans, rice, greens/green beans, cabbage   Beverage(s) water, dilute regular pepsi   Exercise   Exercise Type ADL's   Patient Education   Previous Diabetes Education No   Disease state  Definition of diabetes, type 1 and 2, and the diagnosis of diabetes;Factors that contribute to the development of diabetes   Nutrition management  Role of diet in the treatment of diabetes and the relationship between the three main macronutrients and blood glucose level;Food label reading, portion sizes and measuring food.;Carbohydrate counting;Meal options for control of blood glucose level and chronic complications.   Physical activity and exercise  Role of exercise on diabetes management, blood pressure control and cardiac health.;Identified with patient nutritional and/or medication changes necessary with exercise.;Helped patient identify appropriate exercises in relation to his/her diabetes, diabetes complications and other health issue.   Medications Reviewed patients medication  for diabetes, action, purpose, timing of dose and side effects.   Monitoring Identified appropriate SMBG and/or A1C goals.   Acute complications Taught treatment of hypoglycemia - the 15 rule.   Chronic complications Relationship between chronic complications and blood glucose control;Identified and discussed with patient  current chronic complications   Psychosocial  adjustment Helped patient identify a support system for diabetes management   Personal strategies to promote health Lifestyle issues that need to be addressed for better diabetes care   Individualized Goals (developed by patient)   Nutrition General guidelines for healthy choices and portions discussed   Physical Activity Exercise 3-5 times per week;30 minutes per day   Monitoring  test my blood glucose as discussed   Reducing Risk treat hypoglycemia with 15 grams of carbs if blood glucose less than 69m/dL;increase portions of nuts and seeds   Outcomes   Expected Outcomes Demonstrated interest in learning. Expect positive outcomes   Future DMSE PRN   Program Status Completed      Individualized Plan for Diabetes Self-Management Training:   Learning Objective:  Patient will have a greater understanding of diabetes self-management. Patient education plan is to attend individual and/or group sessions per assessed needs and concerns.   Plan:   Patient Instructions  Plan:  Aim for 45-60 Carb Choices per meal (3-4 grams) +/- 1 either way  Aim for 0-15 Carbs per snack if hungry  Include protein in moderation with your meals and snacks Consider reading food labels for Total Carbohydrate and Fat Grams of foods Consider  increasing your activity level by going to the gym for 30-45 minutes daily as tolerated Continue checking BG at alternate times per day as directed by MD  Conrinue taking medication as directed by MD  Consider NRomona CurlsProtein Bars Kind Bars Light & Fit GREEK Yogurt Fruits & nuts/ fruit & cheese  Eat breakfast and take insulin before going to gym Peanut butter & jelly (sugar free) TKuwait& cheese sandwich After working out out a protein/meal replacement shake  Always carry a bottle of water, snack and glucometer with you  Expected Outcomes:  Demonstrated interest in learning. Expect positive outcomes  Education material provided: Living Well with Diabetes,  A1C conversion sheet, Meal plan card and My Plate  If problems or questions, patient to contact team via:  Phone  Future DSME appointment: PRN

## 2015-09-21 ENCOUNTER — Ambulatory Visit (INDEPENDENT_AMBULATORY_CARE_PROVIDER_SITE_OTHER)
Admission: RE | Admit: 2015-09-21 | Discharge: 2015-09-21 | Disposition: A | Discharge status: Home / Self Care | Payer: BLUE CROSS/BLUE SHIELD | Source: Ambulatory Visit | Attending: Internal Medicine | Admitting: Internal Medicine

## 2015-09-21 DIAGNOSIS — R918 Other nonspecific abnormal finding of lung field: Secondary | ICD-10-CM

## 2015-09-23 ENCOUNTER — Other Ambulatory Visit: Payer: Self-pay

## 2015-09-23 DIAGNOSIS — R918 Other nonspecific abnormal finding of lung field: Secondary | ICD-10-CM

## 2015-09-23 NOTE — Progress Notes (Signed)
Quick Note:  He has multiple tiny lung nodules - inflammation ? Cancer (doubt)  Needs to see pulmonary please ______

## 2015-09-26 ENCOUNTER — Encounter: Payer: Self-pay | Admitting: Family Medicine

## 2015-09-26 ENCOUNTER — Ambulatory Visit (INDEPENDENT_AMBULATORY_CARE_PROVIDER_SITE_OTHER): Payer: BLUE CROSS/BLUE SHIELD | Admitting: Family Medicine

## 2015-09-26 ENCOUNTER — Ambulatory Visit: Payer: BLUE CROSS/BLUE SHIELD | Admitting: Family Medicine

## 2015-09-26 VITALS — BP 112/80 | HR 100 | Temp 98.5°F | Wt 243.0 lb

## 2015-09-26 DIAGNOSIS — E1165 Type 2 diabetes mellitus with hyperglycemia: Secondary | ICD-10-CM

## 2015-09-26 DIAGNOSIS — Z794 Long term (current) use of insulin: Secondary | ICD-10-CM

## 2015-09-26 NOTE — Patient Instructions (Signed)
No changes today  Continue to hold your mealtime insulin if sugar is under 85  See me on June 18th or later and we will recheck a1c.   Can ask Dorna Bloom our office manager about self pay prices but I would encourage you to try to keep insurance

## 2015-09-26 NOTE — Progress Notes (Signed)
Subjective:  Angel Frazier is a 60 y.o. year old very pleasant male patient who presents for/with See problem oriented charting ROS- no hypoglycemia, no chest pain or abdominal pain, no nausea or vomiting  Past Medical History-  Patient Active Problem List   Diagnosis Date Noted  . Uncontrolled diabetes mellitus ([place]) 08/31/2015    Priority: High  . DKA (diabetic ketoacidoses) ([place]) 08/19/2015    Priority: High  . Long-term use of immunosuppressant medication - 6 MP 07/12/2015    Priority: High  . Long term current use of systemic steroids 11/18/2014    Priority: High  . LEFT SIDED ULCERATIVE COLITIS 08/11/2008    Priority: High  . Avascular necrosis of femoral heads 11/18/2014    Priority: Low  . Hemorrhoids, internal, with bleeding 06/22/2014    Priority: Low  . Vitamin D deficiency 01/24/2013    Priority: Low  . ED (erectile dysfunction) 05/21/2012    Priority: Low  . Drug-induced acute pancreatitis - 6MP 09/07/2015  . Pulmonary nodules 09/07/2015    Medications- reviewed and updated Current Outpatient Prescriptions  Medication Sig Dispense Refill  . Ascorbic Acid (VITAMIN C) 1000 MG tablet Take 1,000 mg by mouth daily.    Marland Kitchen aspirin 81 MG tablet Take 81 mg by mouth daily.      . Calcium Carbonate-Vitamin D (CALCIUM-VITAMIN D3 PO) Take 1 tablet by mouth daily. Calcium 1200 vit d3 plus    . Cholecalciferol (VITAMIN D3) 50000 UNITS CAPS Take 1 tablet by mouth once a week. (Patient taking differently: Take 1 tablet by mouth every Monday. ) 6 capsule 0  . insulin aspart (NOVOLOG) 100 UNIT/ML FlexPen Inject 8 Units into the skin 3 (three) times daily with meals. 15 mL 11  . Insulin Glargine (LANTUS) 100 UNIT/ML Solostar Pen Inject 30 Units into the skin daily at 10 pm. 15 mL 11  . mesalamine (CANASA) 1000 MG suppository Place 1 suppository (1,000 mg total) rectally at bedtime as needed. 30 suppository 12  . mesalamine (LIALDA) 1.2 g EC tablet Take 2 tablets (2.4 g total) by mouth 2  (two) times daily. 60 tablet   . metFORMIN (GLUCOPHAGE) 500 MG tablet Take 1 tablet (500 mg total) by mouth 2 (two) times daily with a meal. 180 tablet 3  . METHOCARBAMOL PO Take 500 mg by mouth daily as needed (for muscle spasms).     . Multiple Vitamins-Minerals (CENTRUM SILVER PO) Take 1 tablet by mouth daily.    . Omega-3 Fatty Acids (FISH OIL) 300 MG CAPS Take 300 mg by mouth daily.     Marland Kitchen UNABLE TO FIND Outpatient OT 1 each 0  . vitamin B-12 (CYANOCOBALAMIN) 1000 MCG tablet Take 2,500 mcg by mouth daily.     . cyclobenzaprine (FLEXERIL) 10 MG tablet Take 10 mg by mouth 3 (three) times daily as needed for muscle spasms. Reported on 09/26/2015    . oxyCODONE-acetaminophen (PERCOCET/ROXICET) 5-325 MG tablet Take 2 tablets by mouth every 4 (four) hours as needed for severe pain. (Patient not taking: Reported on 09/26/2015) 20 tablet 0   No current facility-administered medications for this visit.    Objective: BP 112/80 mmHg  Pulse 100  Temp(Src) 98.5 F (36.9 C)  Wt 243 lb (110.224 kg) Gen: NAD, resting comfortably Mucous membranes are moist. CV: RRR no murmurs rubs or gallops Lungs: CTAB no crackles, wheeze, rhonchi Abdomen: soft/nontender/nondistended/normal bowel sounds. No rebound or guarding.  Ext: no edema Skin: warm, dry Neuro: grossly normal, moves all extremities  Assessment/Plan:  Uncontrolled diabetes mellitus (HCC) S: compliant with Metformin 500 in AM. lantus 30 units. 8 units novolog but 10 at dinner.  fastings from 90-110 over last 2 weeks Before meals range from 75-low 200s but usually in 100-150 per check.  Before bed 130-200, last visit before bed was 211-363 Lab Results  Component Value Date   HGBA1C 13.2* 08/19/2015  A/P: Suspect drastically improved control. We discussed adding metformin in evening but patient is hesitant to make any changes. He is missing a dose of insulin every few days when sugar is in upper 70s- he may continue this: If sugar under 85-  do not take mealtime insulin. Discussed if gets any below 70 that we may reduce to 7/7/9 from 01/09/09. Fastings are ideal so hesitant to decrease Lantus. Follow up June 18th or later and update a1c  Return precautions advised.   The duration of face-to-face time during this visit was 15 minutes. Greater than 50% of this time was spent in counseling, explanation of diagnosis, planning of further management, and/or coordination of care.   [name], MD

## 2015-09-26 NOTE — Assessment & Plan Note (Signed)
S: compliant with Metformin 500 in AM. lantus 30 units. 8 units novolog but 10 at dinner.  fastings from 90-110 over last 2 weeks Before meals range from 75-low 200s but usually in 100-150 per check.  Before bed 130-200, last visit before bed was 211-363 Lab Results  Component Value Date   HGBA1C 13.2* 08/19/2015  A/P: Suspect drastically improved control. We discussed adding metformin in evening but patient is hesitant to make any changes. He is missing a dose of insulin every few days when sugar is in upper 70s- he may continue this: If sugar under 85- do not take mealtime insulin. Discussed if gets any below 70 that we may reduce to 7/7/9 from 01/09/09. Fastings are ideal so hesitant to decrease Lantus. Follow up June 18th or later and update a1c

## 2015-09-28 ENCOUNTER — Encounter: Payer: Self-pay | Admitting: Internal Medicine

## 2015-09-28 ENCOUNTER — Ambulatory Visit (INDEPENDENT_AMBULATORY_CARE_PROVIDER_SITE_OTHER): Payer: BLUE CROSS/BLUE SHIELD | Admitting: Internal Medicine

## 2015-09-28 VITALS — BP 138/90 | HR 85 | Ht 71.0 in | Wt 243.0 lb

## 2015-09-28 DIAGNOSIS — R918 Other nonspecific abnormal finding of lung field: Secondary | ICD-10-CM | POA: Diagnosis not present

## 2015-09-28 NOTE — Patient Instructions (Signed)
We will call you in 6 months to arrange follow up CT chest but you call us in meantime if you develop  shortness of breath, unexplained cough or pain with breathing

## 2015-09-28 NOTE — Progress Notes (Signed)
Subjective:     Patient ID: Angel Frazier, male   DOB: [birth year],   MRN: [id number]  HPI  46 yobm quit smoking 2002 p pna with no residual symptoms and nl baseline cxr and CT ABD with MPN's confirmed on CT chest so referred to pulmonary clinic 09/28/2015 by Dr Carlean Purl.  09/28/2015 1st [facility] office visit/ Angel Frazier   No symptoms at all - Not limited by breathing from desired activities  / no cough  No obvious day to day or daytime variability or assoc chronic cough or cp or chest tightness, subjective wheeze or overt sinus or hb symptoms. No unusual exp hx or h/o childhood pna/ asthma or knowledge of premature birth.  Sleeping ok without nocturnal  or early am exacerbation  of respiratory  c/o's or need for noct saba. Also denies any obvious fluctuation of symptoms with weather or environmental changes or other aggravating or alleviating factors except as outlined above   Current Medications, Allergies, Complete Past Medical History, Past Surgical History, Family History, and Social History were reviewed in [employer] record.  ROS  The following are not active complaints unless bolded sore throat, dysphagia, dental problems, itching, sneezing,  nasal congestion or excess/ purulent secretions, ear ache,   fever, chills, sweats, unintended wt loss, classically pleuritic or exertional cp, hemoptysis,  orthopnea pnd or leg swelling, presyncope, palpitations, abdominal pain, anorexia, nausea, vomiting, diarrhea  or change in bowel or bladder habits, change in stools or urine, dysuria,hematuria,  rash, arthralgias, visual complaints, headache, numbness, weakness or ataxia or problems with walking or coordination,  change in mood/affect or memory.           Review of Systems     Objective:   Physical Exam amb slt hoarse bm nad  Wt Readings from Last 3 Encounters:  09/28/15 243 lb (110.224 kg)  09/26/15 243 lb (110.224 kg)  09/13/15 241 lb 8 oz (109.544 kg)     Vital signs reviewed  HEENT: nl dentition, turbinates, and oropharynx. Nl external ear canals without cough reflex   NECK :  without JVD/Nodes/TM/ nl carotid upstrokes bilaterally   LUNGS: no acc muscle use,  Nl contour chest which is clear to A and P bilaterally without cough on insp or exp maneuvers   CV:  RRR  no s3 or murmur or increase in P2, no edema   ABD:  soft and nontender with nl inspiratory excursion in the supine position. No bruits or organomegaly, bowel sounds nl  MS:  Nl gait/ ext warm without deformities, calf tenderness, cyanosis or clubbing No obvious joint restrictions   SKIN: warm and dry without lesions    NEURO:  alert, approp, nl sensorium with  no motor deficits      I personally reviewed images and agree with radiology impression as follows:  CT Chest  09/21/15 Multiple bilateral pulmonary nodules, measuring up to 9 mm. Non-contrast chest CT at 3-6 months is recommended         Assessment:

## 2015-09-29 NOTE — Assessment & Plan Note (Addendum)
CT 09/25/15 Multiple bilateral pulmonary nodules, measuring up to 9 mm > in tickle file for 03/26/16   Although there are clearly abnormalities on CT scan, they should probably be considered "microscopic" since not obvious on plain cxr and note pt has h/o UC which may certainly be assoc with benign MPNs  And most likely these will prove to be benign as well  Discussed in detail all the  indications, usual  risks and alternatives  relative to the benefits with patient who agrees to proceed with conservative f/u as outlined   Total time discussing /counseling = 25/63mov reviewing studies/ options/ need for f/u per guidelines - see avs

## 2015-10-03 NOTE — Progress Notes (Signed)
Quick Note:  I reviewed pulmonary note Please get an update on his colitis sxs - hope he is ok  I will need to see him by July at latest - sooner if having sxs ______

## 2015-11-04 ENCOUNTER — Telehealth: Payer: Self-pay

## 2015-11-04 MED ORDER — MESALAMINE 1.2 G PO TBEC: 2.4000 g | DELAYED_RELEASE_TABLET | Freq: Two times a day (BID) | ORAL | Status: DC

## 2015-11-04 NOTE — Telephone Encounter (Signed)
Mailed form to [employer] for patient assistance with Lialda.  Copy of form sent to be scanned in.  Patient aware.

## 2015-11-09 ENCOUNTER — Telehealth: Payer: Self-pay | Admitting: Internal Medicine

## 2015-11-09 NOTE — Telephone Encounter (Signed)
Form was returned in a pre-paid envelope that came with the forms.   Company notified

## 2015-11-10 ENCOUNTER — Other Ambulatory Visit: Payer: BLUE CROSS/BLUE SHIELD

## 2015-11-15 ENCOUNTER — Encounter: Payer: BLUE CROSS/BLUE SHIELD | Admitting: Family Medicine

## 2015-11-15 ENCOUNTER — Telehealth: Payer: Self-pay | Admitting: Internal Medicine

## 2015-11-15 ENCOUNTER — Other Ambulatory Visit: Payer: Self-pay | Admitting: Family Medicine

## 2015-11-15 NOTE — Telephone Encounter (Signed)
Advised him it was sent and there is a copy scanned in his chart. Told Angel Frazier to contact [employer] directly to confirm they received the form.

## 2015-11-15 NOTE — Telephone Encounter (Addendum)
Pt would like refill rx for his Insulin Glargine (LANTUS) 100 UNIT/ML Solostar Pen AND  insulin aspart (NOVOLOG) 100 UNIT/ML FlexPen  [facility]

## 2015-11-18 ENCOUNTER — Telehealth: Payer: Self-pay

## 2015-11-18 MED ORDER — INSULIN GLARGINE 100 UNIT/ML SOLOSTAR PEN: 30.0000 [IU] | PEN_INJECTOR | Freq: Every day | SUBCUTANEOUS | Status: DC

## 2015-11-18 MED ORDER — INSULIN ASPART 100 UNIT/ML FLEXPEN: 8.0000 [IU] | PEN_INJECTOR | Freq: Three times a day (TID) | SUBCUTANEOUS | Status: DC

## 2015-11-18 NOTE — Telephone Encounter (Signed)
Rx's sent to pharmacy as requested. 

## 2015-11-18 NOTE — Telephone Encounter (Signed)
Received paperwork from [employer]. Voicemail message left for patient on 11/14/15 @1423  requesting return phone call. Attempting to ask patient what his insurance will cover. Left another voicemail message for patient today asking for return phone call. Trying to see if insurance will cover [place]? Awaiting return phone call at this time.

## 2015-11-22 NOTE — Telephone Encounter (Signed)
Patient came into office. States he does not have a good signal where he lives, and he has trouble reaching someone w/ the automated system when he calls in. Went over previous note. Patient states he is confused. Advised will have Roselyn Reef to try and contact when she returns to the office tomorrow. Patient agreed. States ok to leave a vmail.

## 2015-12-07 NOTE — Telephone Encounter (Signed)
Spoke to patient. Currently, he has no insurance. He is currently using Lantus & Novolog. He wants to continue to use these as they are controlling him well. Paperwork for assistance through [employer] placed for Dr. Yong Channel to fill out. Discussed the fact that he is due for an A1C check. He is scheduling an appointment to come in. Informed him we would send paper work back in since he does not want to change insulin.

## 2015-12-13 ENCOUNTER — Other Ambulatory Visit (INDEPENDENT_AMBULATORY_CARE_PROVIDER_SITE_OTHER): Payer: BLUE CROSS/BLUE SHIELD

## 2015-12-13 DIAGNOSIS — E119 Type 2 diabetes mellitus without complications: Secondary | ICD-10-CM

## 2015-12-13 LAB — HEMOGLOBIN A1C: Hgb A1c MFr Bld: 6.4 % (ref 4.6–6.5)

## 2015-12-14 ENCOUNTER — Other Ambulatory Visit: Payer: Self-pay

## 2015-12-14 MED ORDER — INSULIN GLARGINE 100 UNIT/ML SOLOSTAR PEN: 30.0000 [IU] | PEN_INJECTOR | Freq: Every day | SUBCUTANEOUS | Status: DC

## 2015-12-14 MED ORDER — INSULIN ASPART 100 UNIT/ML FLEXPEN: 8.0000 [IU] | PEN_INJECTOR | Freq: Three times a day (TID) | SUBCUTANEOUS | Status: DC

## 2015-12-20 ENCOUNTER — Encounter: Payer: Self-pay | Admitting: Internal Medicine

## 2016-01-05 ENCOUNTER — Telehealth: Payer: Self-pay | Admitting: Family Medicine

## 2016-01-05 NOTE — Telephone Encounter (Signed)
Received a fax from [employer] stating that the patient is eligible to receive Lantus until 12/21/2016.

## 2016-01-12 ENCOUNTER — Telehealth: Payer: Self-pay

## 2016-01-12 NOTE — Telephone Encounter (Signed)
Called patient and left a voicemail message to notify him that his Novolog is here for pickup.

## 2016-03-09 ENCOUNTER — Other Ambulatory Visit: Payer: Self-pay | Admitting: Internal Medicine

## 2016-03-09 DIAGNOSIS — R918 Other nonspecific abnormal finding of lung field: Secondary | ICD-10-CM

## 2016-03-15 ENCOUNTER — Telehealth: Payer: Self-pay

## 2016-03-15 NOTE — Telephone Encounter (Signed)
Called patient and left a voicemail asking for a return phone call to let him know that his insulin is here and ready for pick up.

## 2016-03-28 ENCOUNTER — Telehealth: Payer: Self-pay | Admitting: Internal Medicine

## 2016-03-28 NOTE — Telephone Encounter (Signed)
Patient reports that he is having loose watery stools with rectal bleeding 4-5 days for the last 2 days.  He has been compliant with his Lialda.  No recent antibiotic use or sick contacts.  Please advise

## 2016-03-28 NOTE — Telephone Encounter (Signed)
This is tough due to his situation - he has diabetes so prednisone not a great option and he has lost his insurance I believe so other Tx not easy due to cost.  I think he will need to see an APP soon or me tomorrow to try to sort out what to do.

## 2016-03-29 ENCOUNTER — Encounter: Payer: Self-pay | Admitting: *Deleted

## 2016-03-29 ENCOUNTER — Other Ambulatory Visit: Payer: BLUE CROSS/BLUE SHIELD

## 2016-03-29 NOTE — Telephone Encounter (Signed)
Patient can't come in today.  He will come tomorrow and see Alonza Bogus, PA

## 2016-03-30 ENCOUNTER — Other Ambulatory Visit (INDEPENDENT_AMBULATORY_CARE_PROVIDER_SITE_OTHER): Payer: BLUE CROSS/BLUE SHIELD

## 2016-03-30 ENCOUNTER — Ambulatory Visit (INDEPENDENT_AMBULATORY_CARE_PROVIDER_SITE_OTHER): Payer: BLUE CROSS/BLUE SHIELD | Admitting: Gastroenterology

## 2016-03-30 ENCOUNTER — Encounter: Payer: Self-pay | Admitting: Gastroenterology

## 2016-03-30 VITALS — BP 138/90 | HR 100 | Ht 71.0 in | Wt 251.0 lb

## 2016-03-30 DIAGNOSIS — R197 Diarrhea, unspecified: Secondary | ICD-10-CM

## 2016-03-30 DIAGNOSIS — K5150 Left sided colitis without complications: Secondary | ICD-10-CM

## 2016-03-30 LAB — BASIC METABOLIC PANEL
BUN: 15 mg/dL (ref 6–23)
CALCIUM: 9.8 mg/dL (ref 8.4–10.5)
CO2: 29 mEq/L (ref 19–32)
Chloride: 105 mEq/L (ref 96–112)
Creatinine, Ser: 0.95 mg/dL (ref 0.40–1.50)
GFR: 103.31 mL/min (ref >60.00)
GLUCOSE: 109 mg/dL — AB (ref 70–99)
Potassium: 4.1 mEq/L (ref 3.5–5.1)
SODIUM: 140 meq/L (ref 135–145)

## 2016-03-30 LAB — CBC WITH DIFFERENTIAL/PLATELET
BASOS PCT: 0.4 % (ref 0.0–3.0)
Basophils Absolute: 0 10*3/uL (ref 0.0–0.1)
EOS ABS: 0.2 10*3/uL (ref 0.0–0.7)
EOS PCT: 4 % (ref 0.0–5.0)
HCT: 41.6 % (ref 39.0–52.0)
Hemoglobin: 14.1 g/dL (ref 13.0–17.0)
LYMPHS ABS: 1.5 10*3/uL (ref 0.7–4.0)
Lymphocytes Relative: 29.1 % (ref 12.0–46.0)
MCHC: 34 g/dL (ref 30.0–36.0)
MCV: 90.2 fl (ref 78.0–100.0)
MONO ABS: 0.6 10*3/uL (ref 0.1–1.0)
Monocytes Relative: 11.5 % (ref 3.0–12.0)
NEUTROS ABS: 2.9 10*3/uL (ref 1.4–7.7)
NEUTROS PCT: 55 % (ref 43.0–77.0)
PLATELETS: 259 10*3/uL (ref 150.0–400.0)
RBC: 4.61 Mil/uL (ref 4.22–5.81)
RDW: 13.6 % (ref 11.5–15.5)
WBC: 5.2 10*3/uL (ref 4.0–10.5)

## 2016-03-30 LAB — HIGH SENSITIVITY CRP: CRP HIGH SENSITIVITY: 1.67 mg/L (ref 0.000–5.000)

## 2016-03-30 MED ORDER — BUDESONIDE 9 MG PO TB24: 1.0000 | ORAL_TABLET | Freq: Every day | ORAL | 1 refills | Status: DC

## 2016-03-30 NOTE — Patient Instructions (Addendum)
Please go to the basement level to have your labs drawn and stool study. We sent a prescription to [facility] for Uceris 9 mg.  We have provided you with a savings card. Call to activate the card at [phone].  We made you an appointment with 06-01-2016 at 3:45 PM.

## 2016-03-30 NOTE — Progress Notes (Signed)
     03/30/2016 DAEVEON ZWEBER [id number] [birth year]   History of Present Illness:  This is a 60 year old male who is known to Dr. Carlean Purl for left-sided UC.  Has been taking Lialda 4.8 grams daily and doing well up until just a couple of weeks ago. Now has been having diarrhea/increased frequency on average 4-6 times a day, but sometimes more. Denies any abdominal pain. Does see some blood with his stool, sometimes more than others. He denies any recent travel or antibiotics. No fevers, chills, nausea, vomiting.  As stated in Dr. Celesta Aver previous note, he had some issues with elevated pancreatic enzymes while taking 6-mercaptopurine.  There was some discussion regarding possible need for biologics, but overall he has done well since he was last here in April. He does still have Pharmacist, community until November.  Current Medications, Allergies, Past Medical History, Past Surgical History, Family History and Social History were reviewed in [employer] record.   Physical Exam: BP 138/90   Pulse 100   Ht 5' 11"  (1.803 m)   Wt 251 lb (113.9 kg)   BMI 35.01 kg/m  General: Well developed black male in no acute distress Head: Normocephalic and atraumatic Eyes:  Sclerae anicteric, conjunctiva pink  Ears: Normal auditory acuity Lungs: Clear throughout to auscultation Heart: Regular rate and rhythm Abdomen: Soft, non-distended.  BS present.  Non-tender. Musculoskeletal: Symmetrical with no gross deformities  Extremities: No edema  Neurological: Alert oriented x 4, grossly non-focal Psychological:  Alert and cooperative. Normal mood and affect  Assessment and Recommendations: -60 year old male with left-sided ulcerative colitis. Has been taking Lialda 4.8 grams daily and doing well up until just a couple of weeks ago.  Now having diarrhea and rectal bleeding. We'll check labs including CBC, BMP, CRP. We'll also check stool for C. difficile. Pending the C. difficile is  negative we will start him on Uceris 9 mg daily to see if this will help control his symptoms. He does still have insurance to the beginning of November so we will try to get him a 90 day supply of that with prescription. This will also be better for his diabetes/blood sugar than prednisone.  He will follow-up with Dr. Carlean Purl in approximately 6-8 weeks and will continue the Uceris until then.

## 2016-04-02 ENCOUNTER — Other Ambulatory Visit: Payer: BLUE CROSS/BLUE SHIELD

## 2016-04-02 DIAGNOSIS — K5150 Left sided colitis without complications: Secondary | ICD-10-CM

## 2016-04-03 LAB — CLOSTRIDIUM DIFFICILE BY PCR: CDIFFPCR: NOT DETECTED

## 2016-04-03 NOTE — Progress Notes (Signed)
Agree with Ms. Zehr's management.  Franca Stakes E. Braylin Xu, MD, FACG  

## 2016-04-16 ENCOUNTER — Telehealth: Payer: Self-pay | Admitting: *Deleted

## 2016-04-16 DIAGNOSIS — R918 Other nonspecific abnormal finding of lung field: Secondary | ICD-10-CM

## 2016-04-16 NOTE — Telephone Encounter (Signed)
-----   Message from Tanda Rockers, MD sent at 04/10/2016  4:17 PM EST ----- Needs ct s contrast f/u mpns - declined prev due to insurance, see if willing to have it now and if not notify PC he's not following our recs and no need for f/u at this office

## 2016-04-16 NOTE — Telephone Encounter (Signed)
Spoke with the pt  He agrees to have the CT Chest  I had sent order to [facility]  Nothing further needed

## 2016-04-24 ENCOUNTER — Ambulatory Visit (INDEPENDENT_AMBULATORY_CARE_PROVIDER_SITE_OTHER)
Admission: RE | Admit: 2016-04-24 | Discharge: 2016-04-24 | Disposition: A | Discharge status: Home / Self Care | Payer: BLUE CROSS/BLUE SHIELD | Source: Ambulatory Visit | Attending: Internal Medicine | Admitting: Internal Medicine

## 2016-04-24 DIAGNOSIS — R918 Other nonspecific abnormal finding of lung field: Secondary | ICD-10-CM

## 2016-04-25 NOTE — Progress Notes (Signed)
Spoke with pt and notified of results per Dr. Wert. Pt verbalized understanding and denied any questions. 

## 2016-05-23 ENCOUNTER — Telehealth: Payer: Self-pay | Admitting: Family Medicine

## 2016-05-23 NOTE — Telephone Encounter (Signed)
° ° ° °  Angel Frazier with [facility] said she received a letter from Dr Yong Channel. She call today to ask if the pt had arthritist before taking novolog,   Would like a call back [phone] case number [id number]

## 2016-05-24 ENCOUNTER — Telehealth: Payer: Self-pay

## 2016-05-24 NOTE — Telephone Encounter (Signed)
Returned her call but the office was already closed. It was after 5pm.

## 2016-05-24 NOTE — Telephone Encounter (Signed)
Called patient to let him know his pens have arrived and are ready for pick up. He verbalized understanding. I did let him know that if he came tomorrow to ask for Izora Gala.

## 2016-06-01 ENCOUNTER — Ambulatory Visit: Payer: BLUE CROSS/BLUE SHIELD | Admitting: Internal Medicine

## 2016-06-12 NOTE — Telephone Encounter (Signed)
Called and responded to phone call

## 2016-07-16 ENCOUNTER — Telehealth: Payer: Self-pay | Admitting: Family Medicine

## 2016-07-16 NOTE — Telephone Encounter (Signed)
° ° ° °  Pt call to say he would like to see a diabetic specialist. Would like a call back

## 2016-07-17 NOTE — Telephone Encounter (Signed)
Called patient and left a voicemail for a return phone call

## 2016-07-18 NOTE — Telephone Encounter (Signed)
Yes thanks, may refer to endocrine

## 2016-07-18 NOTE — Telephone Encounter (Signed)
Spoke with patient who is requesting to see an endocrinologist. Is it ok to put a referral in?

## 2016-07-19 ENCOUNTER — Other Ambulatory Visit: Payer: Self-pay

## 2016-07-19 DIAGNOSIS — E1165 Type 2 diabetes mellitus with hyperglycemia: Secondary | ICD-10-CM

## 2016-07-19 DIAGNOSIS — Z794 Long term (current) use of insulin: Principal | ICD-10-CM

## 2016-07-19 NOTE — Telephone Encounter (Signed)
Referral placed.

## 2016-08-13 ENCOUNTER — Encounter: Payer: Self-pay | Admitting: Endocrinology

## 2016-08-13 ENCOUNTER — Ambulatory Visit (INDEPENDENT_AMBULATORY_CARE_PROVIDER_SITE_OTHER): Payer: BLUE CROSS/BLUE SHIELD | Admitting: Endocrinology

## 2016-08-13 VITALS — BP 134/82 | HR 72 | Ht 71.0 in | Wt 252.0 lb

## 2016-08-13 DIAGNOSIS — E1165 Type 2 diabetes mellitus with hyperglycemia: Secondary | ICD-10-CM | POA: Diagnosis not present

## 2016-08-13 DIAGNOSIS — Z794 Long term (current) use of insulin: Secondary | ICD-10-CM | POA: Diagnosis not present

## 2016-08-13 LAB — POCT GLYCOSYLATED HEMOGLOBIN (HGB A1C): Hemoglobin A1C: 6.1

## 2016-08-13 NOTE — Progress Notes (Signed)
Subjective:    Patient ID: Angel Frazier, male    DOB: [birth year], 61 y.o.   MRN: [id number]  HPI pt is referred by Dr Yong Channel, for diabetes.  DM was dx'ed in 2017, when he presented with DKA; he has mild if any neuropathy of the lower extremities; he is unaware of any associated chronic complications; he has been on insulin since dx; pt says his diet is good, but exercise is limited by health problems; he has never had pancreatitis, pancreatic surgery, or severe hypoglycemia. He stopped insulin a few mos ago, due to improved glycemic control.  He says cbg's are still well-controlled, off insulin.   Past Medical History:  Diagnosis Date  .  vitamin D deficiency 01/24/2013   01/23/13 - level 29 - supplements 50k IU weekly x 6 recommended   . Anabolic steroid abuse   . Avascular necrosis of femoral head (HCC)    bilateral  . Bleeding hemorrhoids   . Colitis 08/06/2013   Mildly actibr colitis  . Diabetes mellitus without complication ([place]) 7989   type 2  . Erectile dysfunction   . Hypertension   . Left sided ulcerative colitis ([place])   . Spinal stenosis of lumbar region at multiple levels     Past Surgical History:  Procedure Laterality Date  . COLONOSCOPY     multiple  . HEMORRHOID BANDING  2012   With sigmoidoscopy  . KNEE ARTHROSCOPY Right   . SIGMOIDOSCOPY      Social History   Social History  . Marital status: Single    Spouse name: N/A  . Number of children: 1  . Years of education: N/A   Occupational History  .  [employer]   Social History Main Topics  . Smoking status: Former Smoker    Packs/day: 1.00    Years: 6.00    Types: Cigarettes    Quit date: 06/04/2000  . Smokeless tobacco: Never Used  . Alcohol use No  . Drug use: No  . Sexual activity: Not on file   Other Topics Concern  . Not on file   Social History Narrative  . No narrative on file    Current Outpatient Prescriptions on File Prior to Visit  Medication Sig Dispense Refill  .  Ascorbic Acid (VITAMIN C) 1000 MG tablet Take 1,000 mg by mouth daily. Reported on 09/28/2015    . aspirin 81 MG tablet Take 81 mg by mouth daily. Reported on 09/28/2015    . Budesonide 9 MG TB24 Take 1 tablet by mouth daily. 90 tablet 1  . Calcium Carbonate-Vitamin D (CALCIUM-VITAMIN D3 PO) Take 1 tablet by mouth daily. Reported on 09/28/2015    . Cholecalciferol (VITAMIN D3) 50000 UNITS CAPS Take 1 tablet by mouth once a week. 6 capsule 0  . cyclobenzaprine (FLEXERIL) 10 MG tablet Take 10 mg by mouth 3 (three) times daily as needed for muscle spasms. Reported on 09/26/2015    . mesalamine (LIALDA) 1.2 g EC tablet Take 2 tablets (2.4 g total) by mouth 2 (two) times daily. 360 tablet 3  . metFORMIN (GLUCOPHAGE) 500 MG tablet Take 1 tablet (500 mg total) by mouth 2 (two) times daily with a meal. 180 tablet 3  . METHOCARBAMOL PO Take 500 mg by mouth daily as needed (for muscle spasms).     . Multiple Vitamins-Minerals (CENTRUM SILVER PO) Take 1 tablet by mouth daily. Reported on 09/28/2015    . Omega-3 Fatty Acids (FISH OIL) 300 MG CAPS  Take 300 mg by mouth daily.     . vitamin B-12 (CYANOCOBALAMIN) 1000 MCG tablet Take 2,500 mcg by mouth daily. Reported on 09/28/2015     No current facility-administered medications on file prior to visit.     Allergies  Allergen Reactions  . Mercaptopurine Other (See Comments)    Caused Pancreatitis    Family History  Problem Relation Age of Onset  . Diabetes Mother   . Diabetes      cousin  . Hypertension    . Diabetes type I Son     BP 134/82   Pulse 72   Ht 5' 11"  (1.803 m)   Wt 252 lb (114.3 kg)   SpO2 94%   BMI 35.15 kg/m   Review of Systems denies blurry vision, headache, chest pain, sob, n/v, urinary frequency, muscle cramps, excessive diaphoresis, memory loss, cold intolerance, rhinorrhea, and easy bruising.  He has weight gain.      Objective:   Physical Exam VS: see vs page GEN: no distress HEAD: head: no deformity eyes: no  periorbital swelling, no proptosis external nose and ears are normal mouth: no lesion seen NECK: supple, thyroid is not enlarged CHEST WALL: no deformity LUNGS: clear to auscultation CV: reg rate and rhythm, no murmur ABD: abdomen is soft, nontender.  no hepatosplenomegaly.  not distended.  no hernia MUSCULOSKELETAL: muscle bulk and strength are grossly normal.  no obvious joint swelling.  gait is normal and steady EXTEMITIES: no deformity.  no ulcer on the feet.  feet are of normal color and temp.  Trace bilat leg edema.   PULSES: dorsalis pedis intact bilat.  no carotid bruit NEURO:  cn 2-12 grossly intact.   readily moves all 4's.  sensation is intact to touch on the feet.  SKIN:  Normal texture and temperature.  No rash or suspicious lesion is visible.   NODES:  None palpable at the neck.  PSYCH: alert, well-oriented.  Does not appear anxious nor depressed.   Lab Results  Component Value Date   CREATININE 0.95 03/30/2016   BUN 15 03/30/2016   NA 140 03/30/2016   K 4.1 03/30/2016   CL 105 03/30/2016   CO2 29 03/30/2016   I personally reviewed electrocardiogram tracing (08/19/15): Indication: DKA Impression: NSR.  No MI.  No hypertrophy. Early RVP in chest leads.  LAE Compared to 07/29/14: no change.      Assessment & Plan:  Type 1 DM, new to me, in remission.   Patient is advised the following: Patient Instructions  good diet and exercise significantly improve the control of your diabetes.  please let me know if you wish to be referred to a dietician.  high blood sugar is very risky to your health.  you should see an eye doctor and dentist every year.  It is very important to get all recommended vaccinations.  Controlling your blood pressure and cholesterol drastically reduces the damage diabetes does to your body.  Those who smoke should quit.  Please discuss these with your doctor.  check your blood sugar once a day.  vary the time of day when you check, between before the 3  meals, and at bedtime.  also check if you have symptoms of your blood sugar being too high or too low.  please keep a record of the readings and bring it to your next appointment here (or you can bring the meter itself).  You can write it on any piece of paper.  please call us  sooner if your blood sugar goes below 70, or if you have a lot of readings over 200.  You can stay off the insulin for now.  However, with time, the need for insulin will return.  Please come back for a follow-up appointment in 3-4 months.

## 2016-08-13 NOTE — Patient Instructions (Addendum)
good diet and exercise significantly improve the control of your diabetes.  please let me know if you wish to be referred to a dietician.  high blood sugar is very risky to your health.  you should see an eye doctor and dentist every year.  It is very important to get all recommended vaccinations.  Controlling your blood pressure and cholesterol drastically reduces the damage diabetes does to your body.  Those who smoke should quit.  Please discuss these with your doctor.  check your blood sugar once a day.  vary the time of day when you check, between before the 3 meals, and at bedtime.  also check if you have symptoms of your blood sugar being too high or too low.  please keep a record of the readings and bring it to your next appointment here (or you can bring the meter itself).  You can write it on any piece of paper.  please call us sooner if your blood sugar goes below 70, or if you have a lot of readings over 200.  You can stay off the insulin for now.  However, with time, the need for insulin will return.  Please come back for a follow-up appointment in 3-4 months.

## 2016-08-30 ENCOUNTER — Other Ambulatory Visit (INDEPENDENT_AMBULATORY_CARE_PROVIDER_SITE_OTHER): Payer: BLUE CROSS/BLUE SHIELD

## 2016-08-30 ENCOUNTER — Other Ambulatory Visit: Payer: Self-pay

## 2016-08-30 DIAGNOSIS — Z794 Long term (current) use of insulin: Secondary | ICD-10-CM | POA: Diagnosis not present

## 2016-08-30 DIAGNOSIS — E1165 Type 2 diabetes mellitus with hyperglycemia: Secondary | ICD-10-CM | POA: Diagnosis not present

## 2016-08-30 DIAGNOSIS — IMO0001 Reserved for inherently not codable concepts without codable children: Secondary | ICD-10-CM

## 2016-08-30 LAB — CBC
HEMATOCRIT: 41.3 % (ref 39.0–52.0)
HEMOGLOBIN: 13.9 g/dL (ref 13.0–17.0)
MCHC: 33.6 g/dL (ref 30.0–36.0)
MCV: 93.7 fl (ref 78.0–100.0)
PLATELETS: 227 10*3/uL (ref 150.0–400.0)
RBC: 4.41 Mil/uL (ref 4.22–5.81)
RDW: 13.1 % (ref 11.5–15.5)
WBC: 4.8 10*3/uL (ref 4.0–10.5)

## 2016-08-30 LAB — POC URINALSYSI DIPSTICK (AUTOMATED)
BILIRUBIN UA: NEGATIVE
Blood, UA: NEGATIVE
GLUCOSE UA: NEGATIVE
Ketones, UA: NEGATIVE
LEUKOCYTES UA: NEGATIVE
NITRITE UA: NEGATIVE
Protein, UA: NEGATIVE
Spec Grav, UA: 1.03 (ref 1.030–1.035)
Urobilinogen, UA: 0.2 (ref ?–2.0)
pH, UA: 6 (ref 5.0–8.0)

## 2016-08-30 LAB — BASIC METABOLIC PANEL
BUN: 14 mg/dL (ref 6–23)
CO2: 28 mEq/L (ref 19–32)
Calcium: 9.2 mg/dL (ref 8.4–10.5)
Chloride: 104 mEq/L (ref 96–112)
Creatinine, Ser: 0.97 mg/dL (ref 0.40–1.50)
GFR: 100.72 mL/min (ref >60.00)
GLUCOSE: 92 mg/dL (ref 70–99)
Potassium: 3.9 mEq/L (ref 3.5–5.1)
Sodium: 141 mEq/L (ref 135–145)

## 2016-08-30 LAB — HEPATIC FUNCTION PANEL
ALBUMIN: 4.1 g/dL (ref 3.5–5.2)
ALT: 17 U/L (ref 0–53)
AST: 17 U/L (ref 0–37)
Alkaline Phosphatase: 39 U/L (ref 39–117)
Bilirubin, Direct: 0.1 mg/dL (ref 0.0–0.3)
Total Bilirubin: 0.5 mg/dL (ref 0.2–1.2)
Total Protein: 7 g/dL (ref 6.0–8.3)

## 2016-08-30 LAB — POCT UA - MICROALBUMIN
ALBUMIN/CREATININE RATIO, URINE, POC: 30
Creatinine, POC: 50 mg/dL

## 2016-08-30 LAB — LIPID PANEL
CHOLESTEROL: 128 mg/dL (ref 0–200)
HDL: 35.2 mg/dL — ABNORMAL LOW (ref >39.00)
LDL Cholesterol: 83 mg/dL (ref 0–99)
NonHDL: 93.28
Total CHOL/HDL Ratio: 4
Triglycerides: 49 mg/dL (ref 0.0–149.0)
VLDL: 9.8 mg/dL (ref 0.0–40.0)

## 2016-08-30 LAB — HEMOGLOBIN A1C: HEMOGLOBIN A1C: 6.9 % — AB (ref 4.6–6.5)

## 2016-08-31 ENCOUNTER — Other Ambulatory Visit: Payer: BLUE CROSS/BLUE SHIELD

## 2016-09-07 ENCOUNTER — Ambulatory Visit (INDEPENDENT_AMBULATORY_CARE_PROVIDER_SITE_OTHER): Payer: BLUE CROSS/BLUE SHIELD | Admitting: Family Medicine

## 2016-09-07 ENCOUNTER — Other Ambulatory Visit: Payer: Self-pay | Admitting: Family Medicine

## 2016-09-07 ENCOUNTER — Encounter: Payer: Self-pay | Admitting: Family Medicine

## 2016-09-07 VITALS — BP 124/86 | HR 78 | Temp 98.0°F | Ht 71.0 in | Wt 248.6 lb

## 2016-09-07 DIAGNOSIS — R918 Other nonspecific abnormal finding of lung field: Secondary | ICD-10-CM

## 2016-09-07 DIAGNOSIS — K5150 Left sided colitis without complications: Secondary | ICD-10-CM | POA: Diagnosis not present

## 2016-09-07 DIAGNOSIS — Z1159 Encounter for screening for other viral diseases: Secondary | ICD-10-CM

## 2016-09-07 DIAGNOSIS — E559 Vitamin D deficiency, unspecified: Secondary | ICD-10-CM

## 2016-09-07 DIAGNOSIS — Z0000 Encounter for general adult medical examination without abnormal findings: Secondary | ICD-10-CM | POA: Diagnosis not present

## 2016-09-07 DIAGNOSIS — E119 Type 2 diabetes mellitus without complications: Secondary | ICD-10-CM

## 2016-09-07 DIAGNOSIS — Z125 Encounter for screening for malignant neoplasm of prostate: Secondary | ICD-10-CM | POA: Diagnosis not present

## 2016-09-07 DIAGNOSIS — Z114 Encounter for screening for human immunodeficiency virus [HIV]: Secondary | ICD-10-CM

## 2016-09-07 LAB — PSA: PSA: 0.98 ng/mL (ref 0.10–4.00)

## 2016-09-07 LAB — VITAMIN D 25 HYDROXY (VIT D DEFICIENCY, FRACTURES): VITD: 43.73 ng/mL (ref 30.00–100.00)

## 2016-09-07 NOTE — Progress Notes (Signed)
Phone: [phone]  Subjective:  Patient presents today for their annual physical. Chief complaint-noted.   See problem oriented charting- ROS- full  review of systems was completed and negative including No chest pain or shortness of breath. No headache or blurry vision. No blood in stool or abdominal cramping  The following were reviewed and entered/updated in epic: Past Medical History:  Diagnosis Date  .  vitamin D deficiency 01/24/2013   01/23/13 - level 29 - supplements 50k IU weekly x 6 recommended   . Anabolic steroid abuse   . Avascular necrosis of femoral head (HCC)    bilateral  . Bleeding hemorrhoids   . Colitis 08/06/2013   Mildly actibr colitis  . Diabetes mellitus without complication ([place]) 8182   type 2  . Erectile dysfunction   . Hypertension   . Left sided ulcerative colitis ([place])   . Spinal stenosis of lumbar region at multiple levels    Patient Active Problem List   Diagnosis Date Noted  . Controlled type 2 diabetes mellitus ([place]) 08/31/2015    Priority: High  . LEFT SIDED ULCERATIVE COLITIS 08/11/2008    Priority: High  . Long term current use of systemic steroids 11/18/2014    Priority: Low  . Avascular necrosis of femoral heads 11/18/2014    Priority: Low  . Hemorrhoids, internal, with bleeding 06/22/2014    Priority: Low  . Vitamin D deficiency 01/24/2013    Priority: Low  . ED (erectile dysfunction) 05/21/2012    Priority: Low  . Diarrhea 03/30/2016  . Drug-induced acute pancreatitis - 6MP 09/07/2015  . Pulmonary nodules 09/07/2015  . Colitis 08/06/2013   Past Surgical History:  Procedure Laterality Date  . COLONOSCOPY     multiple  . HEMORRHOID BANDING  2012   With sigmoidoscopy  . KNEE ARTHROSCOPY Right   . SIGMOIDOSCOPY      Family History  Problem Relation Age of Onset  . Diabetes Mother   . Diabetes      cousin  . Hypertension    . Diabetes type I Son     Medications- reviewed and updated Current Outpatient  Prescriptions  Medication Sig Dispense Refill  . Ascorbic Acid (VITAMIN C) 1000 MG tablet Take 1,000 mg by mouth daily. Reported on 09/28/2015    . aspirin 81 MG tablet Take 81 mg by mouth daily. Reported on 09/28/2015    . Budesonide 9 MG TB24 Take 1 tablet by mouth daily. 90 tablet 1  . Calcium Carbonate-Vitamin D (CALCIUM-VITAMIN D3 PO) Take 1 tablet by mouth daily. Reported on 09/28/2015    . Cholecalciferol (VITAMIN D3) 50000 UNITS CAPS Take 1 tablet by mouth once a week. 6 capsule 0  . cyclobenzaprine (FLEXERIL) 10 MG tablet Take 10 mg by mouth 3 (three) times daily as needed for muscle spasms. Reported on 09/26/2015    . mesalamine (LIALDA) 1.2 g EC tablet Take 2 tablets (2.4 g total) by mouth 2 (two) times daily. 360 tablet 3  . metFORMIN (GLUCOPHAGE) 500 MG tablet Take 1 tablet (500 mg total) by mouth 2 (two) times daily with a meal. 180 tablet 3  . METHOCARBAMOL PO Take 500 mg by mouth daily as needed (for muscle spasms).     . Multiple Vitamins-Minerals (CENTRUM SILVER PO) Take 1 tablet by mouth daily. Reported on 09/28/2015    . Omega-3 Fatty Acids (FISH OIL) 300 MG CAPS Take 300 mg by mouth daily.     . vitamin B-12 (CYANOCOBALAMIN) 1000 MCG tablet Take 2,500 mcg  by mouth daily. Reported on 09/28/2015     No current facility-administered medications for this visit.     Allergies-reviewed and updated Allergies  Allergen Reactions  . Mercaptopurine Other (See Comments)    Caused Pancreatitis    Social History   Social History  . Marital status: Single    Spouse name: N/A  . Number of children: 1  . Years of education: N/A   Occupational History  .  [employer]   Social History Main Topics  . Smoking status: Former Smoker    Packs/day: 1.00    Years: 6.00    Types: Cigarettes    Quit date: 06/04/2000  . Smokeless tobacco: Never Used  . Alcohol use No  . Drug use: No  . Sexual activity: Not Asked   Other Topics Concern  . None   Social History Narrative     Single never married. 1 son 16 in 2018. 1 grandson 9 in 2018. Lives in [place] alone      Disability- low back issues   Roll up machine operator      Hobbies: enjoys tinkering in his shed, enjoys going to the gym    Objective: BP 124/86   Pulse 78   Temp 98 F (36.7 C) (Oral)   Ht 5' 11"  (1.803 m)   Wt 248 lb 9.6 oz (112.8 kg)   SpO2 97%   BMI 34.67 kg/m  Gen: NAD, resting comfortably HEENT: Mucous membranes are moist. Oropharynx normal Neck: no thyromegaly CV: RRR no murmurs rubs or gallops Lungs: CTAB no crackles, wheeze, rhonchi Abdomen: soft/nontender/nondistended/normal bowel sounds. No rebound or guarding.  Ext: no edema Skin: warm, dry Neuro: grossly normal, moves all extremities, PERRLA Rectal: normal tone, normal sized prostate, no masses or tenderness  Diabetic Foot Exam - Simple   Simple Foot Form Diabetic Foot exam was performed with the following findings:  Yes 09/07/2016  1:39 PM  Visual Inspection No deformities, no ulcerations, no other skin breakdown bilaterally:  Yes Sensation Testing Intact to touch and monofilament testing bilaterally:  Yes Pulse Check Posterior Tibialis and Dorsalis pulse intact bilaterally:  Yes Comments    Assessment/Plan:  61 y.o. male presenting for annual physical.  Health Maintenance counseling: 1. Anticipatory guidance: Patient counseled regarding regular dental exams q6 months, eye exams - last April but needs repeat, wearing seatbelts.  2. Risk factor reduction:  Advised patient of need for regular exercise and diet rich and fruits and vegetables to reduce risk of heart attack and stroke. Exercise- has not been to gym in a week but usually at least once a week. Diet-really watching diet and has helped with diabetes- no longer on insulin.  Wt Readings from Last 3 Encounters:  09/07/16 248 lb 9.6 oz (112.8 kg)  08/13/16 252 lb (114.3 kg)  03/30/16 251 lb (113.9 kg)  3. Immunizations/screenings/ancillary  studies- declines flu shot, did not tolerate last time Immunization History  Administered Date(s) Administered  . Influenza Split 02/15/2011  . Influenza, Seasonal, Injecte, Preservative Fre 05/21/2012  . Influenza,inj,Quad PF,36+ Mos 06/03/2013  . Td 06/04/2001  . Tdap 05/21/2012   Health Maintenance Due  Topic Date Due  . Hepatitis C Screening - today [birth year]  . FOOT EXAM - normal 03/29/1964  . HIV Screening - today 03/29/1969  . OPHTHALMOLOGY EXAM - needs yearly eye exam- to have copy faxed to [place] 08/31/2016   4. Prostate cancer screening-  PSA was not added with previsit labs. Discussed ordering  today- he agrees. Low risk rectal exam  Lab Results  Component Value Date   PSA 0.90 07/20/2014   PSA 0.66 05/20/2013   PSA 0.82 05/13/2012   5. Colon cancer screening - 08/06/13 with 5 year follow up  Status of chronic or acute concerns   Vitamin D deficiency- discussed option of repeating today - he agreeds  Controlled type 2 diabetes mellitus ([place]) S: well controlled. On metformin 54m BID. On past on latnus 30 units and novolog with each meals. --> now he is off CBGs- always under 180 and usually way under that. Exercise and diet- watching what he eats Lab Results  Component Value Date   HGBA1C 6.9 (H) 08/30/2016   HGBA1C 6.1 08/13/2016   HGBA1C 6.4 12/13/2015   A/P: LDL reasonable at 83. Could push for LDL under 70 though with DM.   LEFT SIDED ULCERATIVE COLITIS Ulcerative coliitis- compliant with lialda. No blood in stool or abodiminal cramping. Compliant with liada 2.4g BID. Plans to schedule Dr. GCarlean Purlfollow up  Pulmonary nodules Pulmonary nodules- advised 1 year follow up from 04/24/16 repeat chest CT. Also one lesion in right upper lobe with repeat every 3 years until 5-6 years stability.  Return in about 6 months (around 03/09/2017) for follow up- or sooner if needed.  Orders Placed This Encounter  Procedures  . PSA  . Hepatitis C antibody, reflex    [facility]   . HIV antibody  . VITAMIN D 25 Hydroxy (Vit-D Deficiency, Fractures)    [facility]   Return precautions advised.   SGarret Reddish MD

## 2016-09-07 NOTE — Assessment & Plan Note (Signed)
Pulmonary nodules- advised 1 year follow up from 04/24/16 repeat chest CT. Also one lesion in right upper lobe with repeat every 3 years until 5-6 years stability.

## 2016-09-07 NOTE — Progress Notes (Signed)
Pre visit review using our clinic review tool, if applicable. No additional management support is needed unless otherwise documented below in the visit note. 

## 2016-09-07 NOTE — Assessment & Plan Note (Signed)
Ulcerative coliitis- compliant with lialda. No blood in stool or abodiminal cramping. Compliant with liada 2.4g BID. Plans to schedule Dr. Carlean Purl follow up

## 2016-09-07 NOTE — Patient Instructions (Addendum)
If you have had your eye exam within the last year, please sign release of information at check out desk. If you have not had an eye exam within a year, please get one at this time as this is important for your diabetes care  Please stop by lab before you go   [place]!!!  [facility] [place] for ACUTE CARE  Such as: Sprains, Injuries, cuts, abrasions, rashes, muscle pain, joint pain, back pain Colds, flu, sore throats, headache, allergies, cough, fever  Ear pain, sinus and eye infections Abdominal pain, nausea, vomiting, diarrhea, upset stomach Animal/insect bites  3 Easy Ways to Schedule: Walk-In Scheduling Call in scheduling Mychart Sign-up: [email or web address]

## 2016-09-07 NOTE — Assessment & Plan Note (Signed)
S: well controlled. On metformin 532m BID. On past on latnus 30 units and novolog with each meals. --> now he is off CBGs- always under 180 and usually way under that. Exercise and diet- watching what he eats Lab Results  Component Value Date   HGBA1C 6.9 (H) 08/30/2016   HGBA1C 6.1 08/13/2016   HGBA1C 6.4 12/13/2015   A/P: LDL reasonable at 83. Could push for LDL under 70 though with DM.

## 2016-09-08 LAB — HEPATITIS C ANTIBODY: HCV Ab: NEGATIVE

## 2016-09-08 LAB — HIV ANTIBODY (ROUTINE TESTING W REFLEX): HIV 1&2 Ab, 4th Generation: NONREACTIVE

## 2016-09-12 ENCOUNTER — Other Ambulatory Visit: Payer: Self-pay | Admitting: Family Medicine

## 2016-09-13 ENCOUNTER — Telehealth: Payer: Self-pay | Admitting: Internal Medicine

## 2016-09-13 NOTE — Telephone Encounter (Signed)
Patient reports diarrhea 3 watery loose stools with occasional blood for the last few weeks. Says he is having a UC flare.  He is taking his Lialda 2 BID.  Please advise

## 2016-09-14 MED ORDER — PREDNISONE 10 MG PO TABS: 40.0000 mg | ORAL_TABLET | Freq: Every day | ORAL | 0 refills | Status: DC

## 2016-09-14 NOTE — Telephone Encounter (Signed)
Have him start Prednisone 10 mg tabs 4 each day and call back with an update early next week and I will advise taper from there.  Disp # 100 no refill

## 2016-09-14 NOTE — Telephone Encounter (Signed)
Patient notified

## 2016-09-14 NOTE — Telephone Encounter (Signed)
I left a message for the patient to call back to discuss.

## 2016-09-20 ENCOUNTER — Telehealth: Payer: Self-pay | Admitting: Internal Medicine

## 2016-09-20 NOTE — Telephone Encounter (Signed)
Left message for pt to call back  °

## 2016-09-20 NOTE — Telephone Encounter (Signed)
Left message for patient to call back  

## 2016-09-20 NOTE — Telephone Encounter (Signed)
Patient calling back regarding this best call  back  number is [phone]  has questions regarding Tapering for Prednisone.

## 2016-09-21 NOTE — Telephone Encounter (Signed)
Reduce by 10 mg qd each week so go to 30 x 1 week, 20 x 1 week then 10 x 2 weeks and 5 x 2 weeks please  Let me know if he has more problems

## 2016-09-21 NOTE — Telephone Encounter (Signed)
Left message for pt to call back.  Spoke with pt and he is aware.

## 2016-09-21 NOTE — Telephone Encounter (Signed)
Pt states he is doing better and is calling back to see how Dr. Carlean Purl wants him to taper off of the prednisone. Please advise.

## 2016-10-22 ENCOUNTER — Telehealth: Payer: Self-pay

## 2016-10-22 IMAGING — CT CT CHEST W/O CM
2 of 3 series · 15 of 36 positions shown, 18 images · non-contrast
Comparison: CT abdomen pelvis 08/19/2015.

CLINICAL DATA: Right lower lobe pulmonary nodule.

EXAM:
CT CHEST WITHOUT CONTRAST
TECHNIQUE: Multidetector CT imaging of the chest was performed following the
standard protocol without IV contrast.

[Series 2: thorax · axial · 0.78mm/px · z∈[+1204,+1434]mm · 12 of 136 slices shown, 15 images]
[im 11/136  mediastinal]
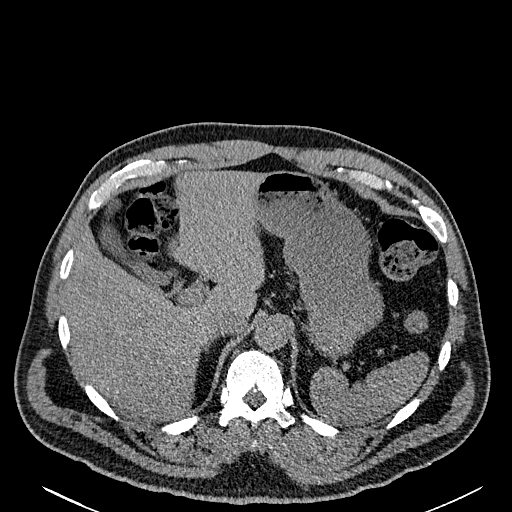
[im 11/136  lung]
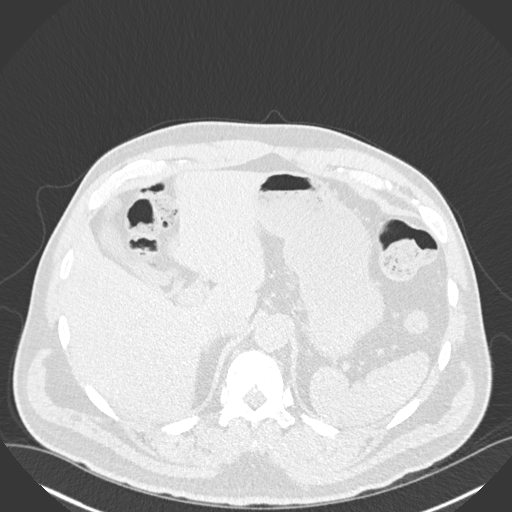
[im 21/136  lung]
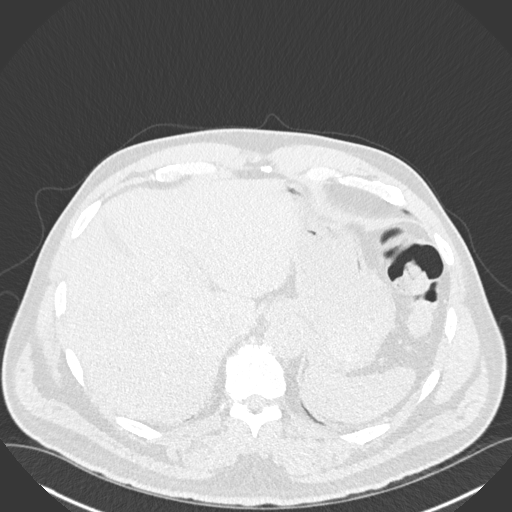
[im 31/136  lung]
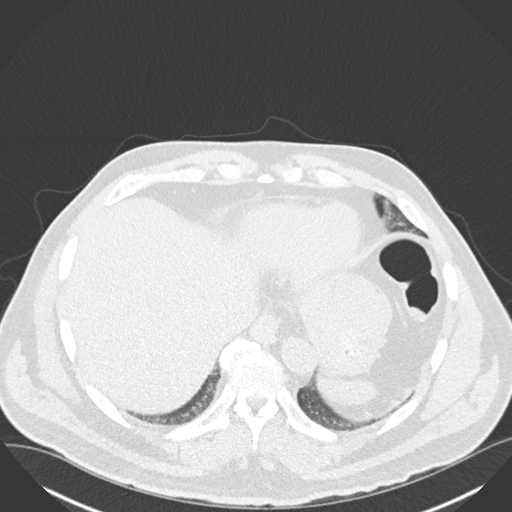
[im 41/136  lung]
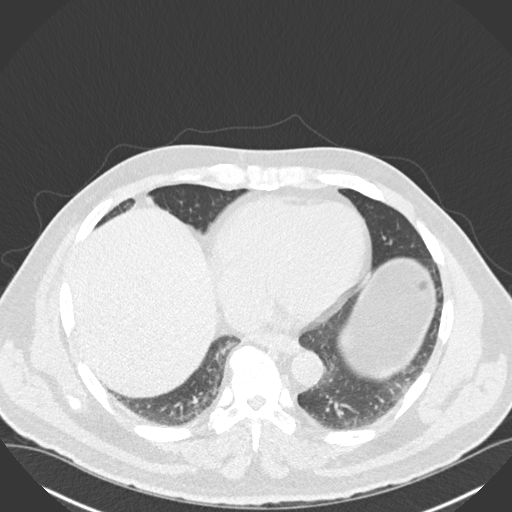
[im 51/136  mediastinal]
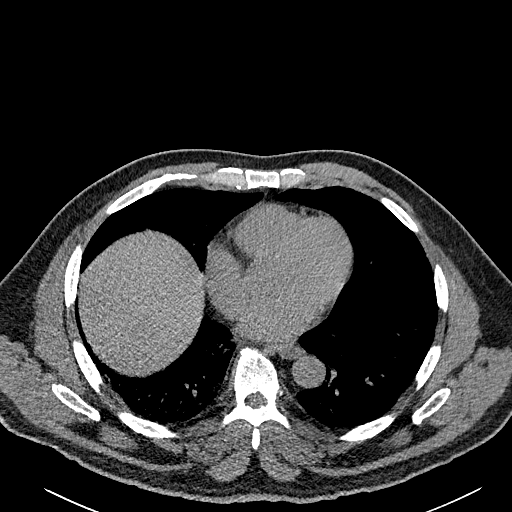
[im 51/136  lung]
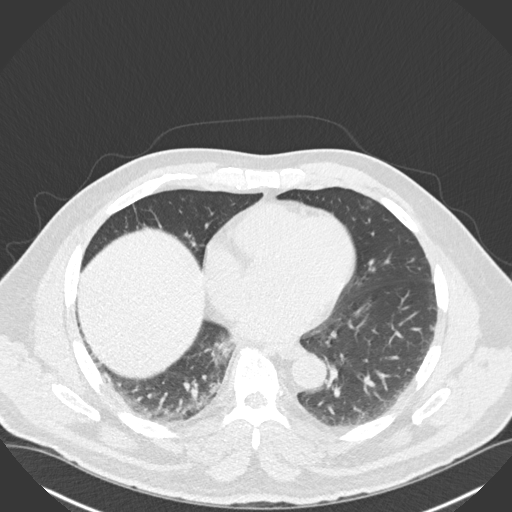
[im 61/136  lung]
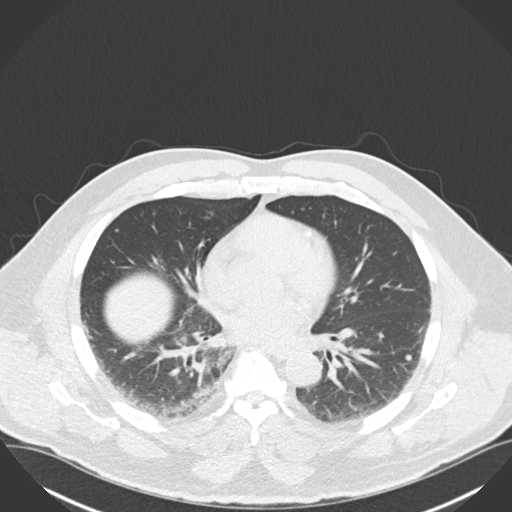
[im 76/136  lung]
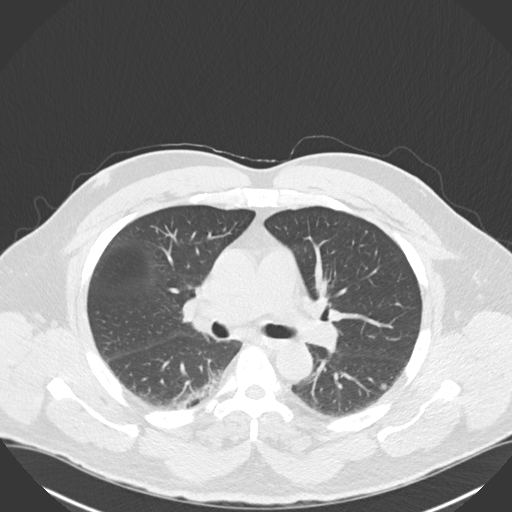
[im 86/136  lung]
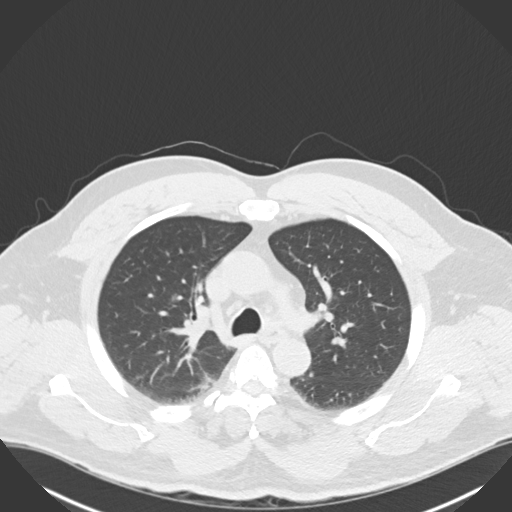
[im 96/136  mediastinal]
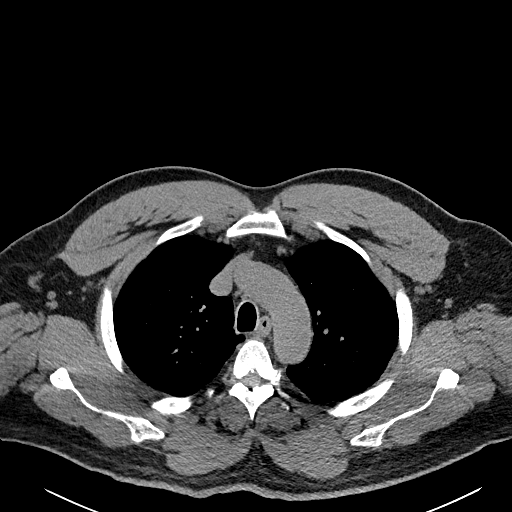
[im 96/136  lung]
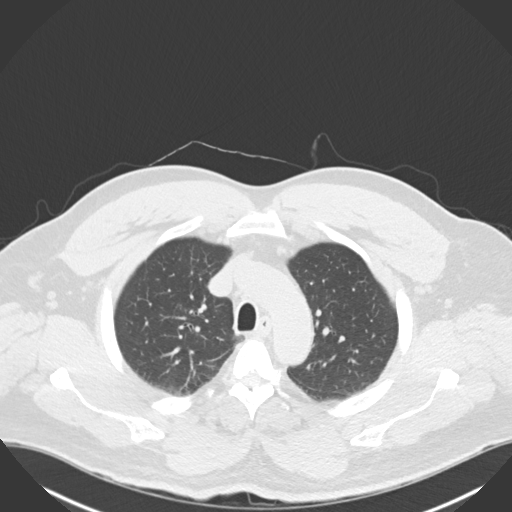
[im 106/136  lung]
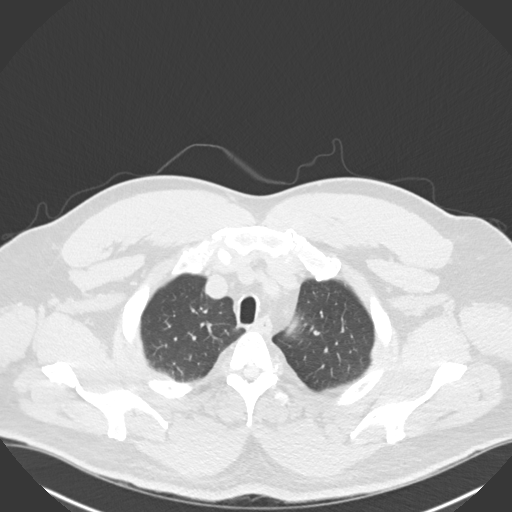
[im 116/136  lung]
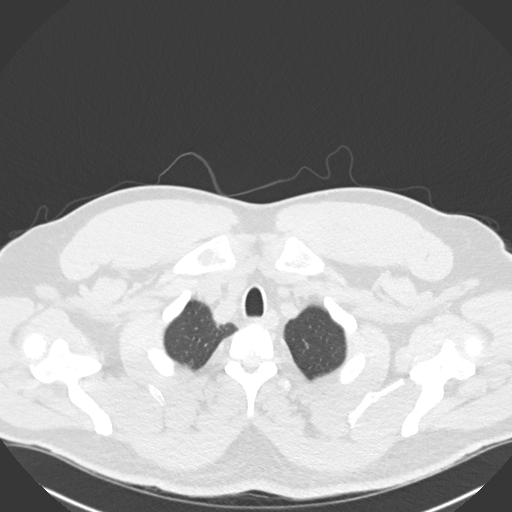
[im 126/136  lung]
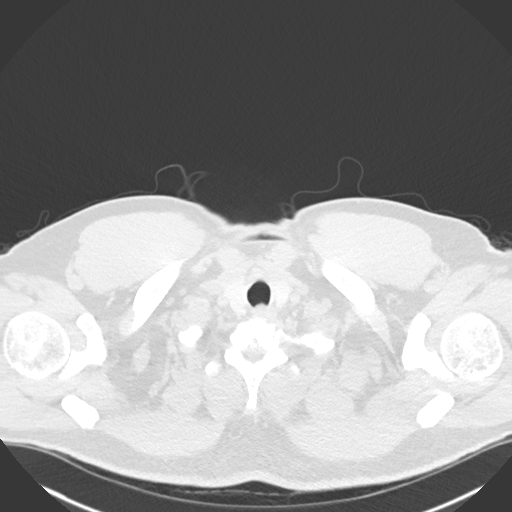

[Series 5: coronal · coronal · 0.59mm/px · 3 of 115 slices shown]
[im 23/115  lung]
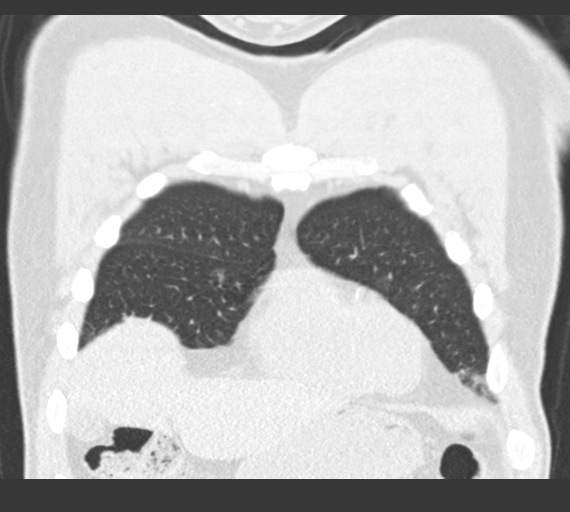
[im 46/115  lung]
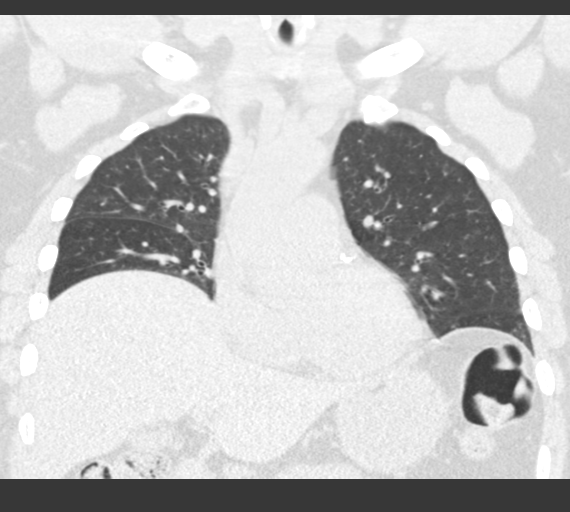
[im 69/115  lung]
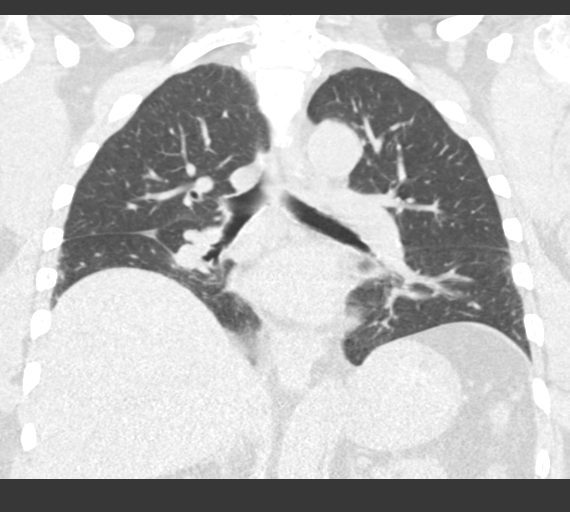

[15 of 36 positions shown; findings below may reference images not displayed]

FINDINGS: Mediastinum/Nodes: Mediastinal lymph nodes are not enlarged by CT
size criteria. Hilar regions are difficult to definitively evaluate
without IV contrast. No axillary adenopathy. Pulmonary arteries are
borderline enlarged. Coronary artery calcification. Heart size
normal. No pericardial effusion.

Lungs/Pleura: Scattered bilateral pulmonary nodules measure up to 9
mm (8 x 9 mm, series 3/image 64). Those visualized on the exam of
08/19/2015 are unchanged in the short interval. Mild dependent
atelectasis bilaterally. No pleural fluid. Airway is unremarkable.

Upper abdomen: Visualized portions of the liver, gallbladder,
adrenal glands, right kidney, spleen, pancreas, stomach and bowel
are grossly unremarkable.

Musculoskeletal: No worrisome lytic or sclerotic lesions.
Degenerative changes are seen in the spine.
IMPRESSION: 1. Multiple bilateral pulmonary nodules, measuring up to 9 mm.
Non-contrast chest CT at 3-6 months is recommended. If the nodules
are stable at time of repeat CT, then future CT at 18-24 months
(from today's scan) is considered optional for low-risk patients,
but is recommended for high-risk patients. This recommendation
follows the consensus statement: Guidelines for Management of
Incidental Pulmonary Nodules Detected on CT Images:From the
[HOSPITAL] 5826; published online before print
(10.1148/radiol.5478787853).
2. Coronary artery calcification.

## 2016-10-22 MED ORDER — MESALAMINE 1.2 G PO TBEC: 2.4000 g | DELAYED_RELEASE_TABLET | Freq: Two times a day (BID) | ORAL | 3 refills | Status: DC

## 2016-10-22 NOTE — Telephone Encounter (Signed)
Filled out a patient assistance form from [facility] program.  I will have Dr Carlean Purl sign the rx tomorrow. I 've left [place] a message on both home and cell to call me so I can see if he wants to put his income information with the form.

## 2016-10-23 NOTE — Telephone Encounter (Signed)
Patient called back and said to fax form so I faxed it to fax # [phone] and will send it down to be scanned into epic.

## 2016-11-02 LAB — HM DIABETES EYE EXAM

## 2016-11-06 ENCOUNTER — Encounter: Payer: Self-pay | Admitting: Family Medicine

## 2016-12-13 ENCOUNTER — Encounter: Payer: Self-pay | Admitting: Endocrinology

## 2016-12-13 ENCOUNTER — Ambulatory Visit (INDEPENDENT_AMBULATORY_CARE_PROVIDER_SITE_OTHER): Payer: BLUE CROSS/BLUE SHIELD | Admitting: Endocrinology

## 2016-12-13 VITALS — BP 134/92 | HR 72 | Ht 71.0 in | Wt 247.0 lb

## 2016-12-13 DIAGNOSIS — E119 Type 2 diabetes mellitus without complications: Secondary | ICD-10-CM

## 2016-12-13 LAB — POCT GLYCOSYLATED HEMOGLOBIN (HGB A1C): HEMOGLOBIN A1C: 5.9

## 2016-12-13 MED ORDER — PIOGLITAZONE HCL-METFORMIN HCL 15-850 MG PO TABS: 1.0000 | ORAL_TABLET | Freq: Every day | ORAL | 3 refills | Status: DC

## 2016-12-13 NOTE — Patient Instructions (Addendum)
check your blood sugar once a day.  vary the time of day when you check, between before the 3 meals, and at bedtime.  also check if you have symptoms of your blood sugar being too high or too low.  please keep a record of the readings and bring it to your next appointment here (or you can bring the meter itself).  You can write it on any piece of paper.  please call us sooner if your blood sugar goes below 70, or if you have a lot of readings over 200.  You can stay off the insulin for now.  However, with time, the need for insulin will return.  The best way to delay that is to lose weight.  I have sent a prescription to your pharmacy, to change metformin to a combination pill.   Please come back for a follow-up appointment in 6 months.

## 2016-12-13 NOTE — Progress Notes (Signed)
Subjective:    Patient ID: Angel Frazier, male    DOB: 1955/10/17, 61 y.o.   MRN: [id number]  HPI Pt returns for f/u of diabetes mellitus: DM type: 1 Dx'ed: 6644 Complications: none Therapy: insulin since dx DKA: never Severe hypoglycemia: never Pancreatitis: never Pancreatic imaging: normal on 2017 CT Other: he has been in remission since soon after dx Interval history:  He says cbg's are still well-controlled, off insulin.  pt states he feels well in general. Past Medical History:  Diagnosis Date  .  vitamin D deficiency 01/24/2013   01/23/13 - level 29 - supplements 50k IU weekly x 6 recommended   . Anabolic steroid abuse   . Avascular necrosis of femoral head (HCC)    bilateral  . Bleeding hemorrhoids   . Colitis 08/06/2013   Mildly actibr colitis  . Diabetes mellitus without complication ([facility]) 0347   type 2  . Erectile dysfunction   . Hypertension   . Left sided ulcerative colitis ([place])   . Spinal stenosis of lumbar region at multiple levels     Past Surgical History:  Procedure Laterality Date  . COLONOSCOPY     multiple  . HEMORRHOID BANDING  2012   With sigmoidoscopy  . KNEE ARTHROSCOPY Right   . SIGMOIDOSCOPY      Social History   Social History  . Marital status: Single    Spouse name: N/A  . Number of children: 1  . Years of education: N/A   Occupational History  .  [employer]   Social History Main Topics  . Smoking status: Former Smoker    Packs/day: 1.00    Years: 6.00    Types: Cigarettes    Quit date: 06/04/2000  . Smokeless tobacco: Never Used  . Alcohol use No  . Drug use: No  . Sexual activity: Not on file   Other Topics Concern  . Not on file   Social History Narrative   Single never married. 1 son 25 in 2018. 1 grandson 59 in 2018. 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 Ascorbic Acid (VITAMIN C) 1000 MG tablet Take 1,000 mg by mouth daily. Reported on 09/28/2015    . aspirin 81 MG tablet Take 81 mg by mouth daily. Reported on 09/28/2015    . Budesonide 9 MG TB24 Take 1 tablet by mouth daily. 90 tablet 1  . Calcium Carbonate-Vitamin D (CALCIUM-VITAMIN D3 PO) Take 1 tablet by mouth daily. Reported on 09/28/2015    . Cholecalciferol (VITAMIN D3) 50000 UNITS CAPS Take 1 tablet by mouth once a week. 6 capsule 0  . cyclobenzaprine (FLEXERIL) 10 MG tablet Take 10 mg by mouth 3 (three) times daily as needed for muscle spasms. Reported on 09/26/2015    . mesalamine (LIALDA) 1.2 g EC tablet Take 2 tablets (2.4 g total) by mouth 2 (two) times daily. 360 tablet 3  . METHOCARBAMOL PO Take 500 mg by mouth daily as needed (for muscle spasms).     . Multiple Vitamins-Minerals (CENTRUM SILVER PO) Take 1 tablet by mouth daily. Reported on 09/28/2015    . Omega-3 Fatty Acids (FISH OIL) 300 MG CAPS Take 300 mg by mouth daily.     Marland Kitchen  vitamin B-12 (CYANOCOBALAMIN) 1000 MCG tablet Take 2,500 mcg by mouth daily. Reported on 09/28/2015     No current facility-administered medications on file prior to visit.     Allergies  Allergen Reactions  . Mercaptopurine Other (See Comments)    Caused Pancreatitis    Family History  Problem Relation Age of Onset  . Diabetes Mother   . Diabetes Unknown        cousin  . Hypertension Unknown   . Diabetes type I Son     BP (!) 134/92   Pulse 72   Ht 5' 11"  (1.803 m)   Wt 247 lb (112 kg)   SpO2 96%   BMI 34.45 kg/m    Review of Systems He has lost 5 lbs.    Objective:   Physical Exam VITAL SIGNS:  See vs page.  GENERAL: no distress Pulses: foot pulses are intact bilaterally.   MSK: no deformity of the feet or ankles.  CV: no edema of the legs or ankles.  Skin:  no ulcer on the feet or ankles.  normal color and temp on the feet and ankles.  Neuro: sensation is  intact to touch on the feet and ankles.     A1c=5.9%    Assessment & Plan:  Type 1 DM, in remission. Weight loss, intentional.  Pt is advised to continue his efforts.  Patient Instructions  check your blood sugar once a day.  vary the time of day when you check, between before the 3 meals, and at bedtime.  also check if you have symptoms of your blood sugar being too high or too low.  please keep a record of the readings and bring it to your next appointment here (or you can bring the meter itself).  You can write it on any piece of paper.  please call us sooner if your blood sugar goes below 70, or if you have a lot of readings over 200.  You can stay off the insulin for now.  However, with time, the need for insulin will return.  The best way to delay that is to lose weight.  I have sent a prescription to your pharmacy, to change metformin to a combination pill.   Please come back for a follow-up appointment in 6 months.

## 2017-01-14 ENCOUNTER — Telehealth: Payer: Self-pay | Admitting: Internal Medicine

## 2017-01-14 DIAGNOSIS — R197 Diarrhea, unspecified: Secondary | ICD-10-CM

## 2017-01-14 NOTE — Telephone Encounter (Signed)
Patient reports that he is having diarrhea for 2 weeks.  He is not having any bleeding.  He is maintained on Lialda 2 BID.  Budesonide is on his list, but he completed this prescription.  Some urgency.  Please advise

## 2017-01-14 NOTE — Telephone Encounter (Signed)
No answer/machine I will continue to try and reach the patient

## 2017-01-15 ENCOUNTER — Other Ambulatory Visit: Payer: BLUE CROSS/BLUE SHIELD

## 2017-01-15 MED ORDER — DIPHENOXYLATE-ATROPINE 2.5-0.025 MG PO TABS: 1.0000 | ORAL_TABLET | Freq: Four times a day (QID) | ORAL | 0 refills | Status: DC | PRN

## 2017-01-15 NOTE — Telephone Encounter (Signed)
Please ask about watery/soft # stools etc  I do not see any recent Abx but ask   Updat - I think he is on patient assistance plan - how is he buying Rxs?

## 2017-01-15 NOTE — Telephone Encounter (Signed)
No recent antibiotics. He says the diarrhea is watery at times.  He uses an assistance program as well as insurance for his drug plan.

## 2017-01-15 NOTE — Addendum Note (Signed)
Addended by: Marlon Pel on: 01/15/2017 03:14 PM   Modules accepted: Orders

## 2017-01-15 NOTE — Telephone Encounter (Signed)
No answer/machine .  I will await a return call from the patient

## 2017-01-15 NOTE — Telephone Encounter (Signed)
I need him to do a C. difficile PCR If he wants a prescription for Lomotil we can do that while were checking for C. difficile, one every 6 hours when necessary #60 no refills  If he does not hear from me by Friday a.m. have him call

## 2017-01-16 ENCOUNTER — Other Ambulatory Visit: Payer: BLUE CROSS/BLUE SHIELD

## 2017-01-16 DIAGNOSIS — R197 Diarrhea, unspecified: Secondary | ICD-10-CM

## 2017-01-17 ENCOUNTER — Other Ambulatory Visit: Payer: Self-pay

## 2017-01-17 LAB — CLOSTRIDIUM DIFFICILE BY PCR: Toxigenic C. Difficile by PCR: NOT DETECTED

## 2017-01-17 MED ORDER — PREDNISONE 10 MG PO TABS: ORAL_TABLET | ORAL | 0 refills | Status: DC

## 2017-01-17 NOTE — Progress Notes (Signed)
C. difficile test is negative so I think he is having a flare of his ulcerative colitis  To make things better he should start prednisone.  Have him take 10 mg tabs for each day until Tuesday of next week and call us with an update on symptoms and I will advise a taper. We can prescribe #100 no refill  We also need him on the books for an appointment in October to discuss a better long-term treatment plan since his had some relapses despite mesalamine

## 2017-01-22 ENCOUNTER — Telehealth: Payer: Self-pay | Admitting: Internal Medicine

## 2017-01-22 NOTE — Telephone Encounter (Signed)
Patient currently on 10 mg of predisone.  He is still having 5-6 loose BM a day, no bleeding.  He was to call back after a week at 10 mg with an update

## 2017-01-22 NOTE — Telephone Encounter (Signed)
There was a typo in my message it said for instead of four - tell him I am sorry about that  Have him take 4 tabs daily and let me know how he is doing on Friday  Watch blood sugars - might go up

## 2017-01-23 NOTE — Telephone Encounter (Signed)
Patient notified

## 2017-01-23 NOTE — Telephone Encounter (Signed)
Left message for patient to call back  

## 2017-01-29 ENCOUNTER — Telehealth: Payer: Self-pay | Admitting: Internal Medicine

## 2017-01-29 NOTE — Telephone Encounter (Signed)
Left message for patient to call back  

## 2017-01-30 NOTE — Telephone Encounter (Signed)
Patient calling with an update.  He is currently on 40 mg of prednisone.  He is having 1-2 formed BM a day, no rectal bleeding, and reports " doing better".  Please advise on prednisone taper

## 2017-01-30 NOTE — Telephone Encounter (Signed)
Left message for patient to call back  

## 2017-01-30 NOTE — Telephone Encounter (Signed)
Reduce 10 mg daily each week until he gets to 10 and call me back or call back sooner if worse  I should see him in October

## 2017-01-31 NOTE — Telephone Encounter (Signed)
I left a detailed message for Angel Frazier.  I asked that he call me back to confirm he understands the instructions.

## 2017-02-01 ENCOUNTER — Telehealth: Payer: Self-pay | Admitting: Internal Medicine

## 2017-02-01 NOTE — Telephone Encounter (Signed)
Patient left a message confirming he got the prednisone taper instructions.

## 2017-02-01 NOTE — Telephone Encounter (Signed)
noted 

## 2017-02-25 ENCOUNTER — Other Ambulatory Visit: Payer: Self-pay | Admitting: Internal Medicine

## 2017-02-25 DIAGNOSIS — R918 Other nonspecific abnormal finding of lung field: Secondary | ICD-10-CM

## 2017-03-02 ENCOUNTER — Encounter (HOSPITAL_COMMUNITY): Payer: Self-pay | Admitting: *Deleted

## 2017-03-02 ENCOUNTER — Ambulatory Visit (HOSPITAL_COMMUNITY)
Admission: EM | Admit: 2017-03-02 | Discharge: 2017-03-02 | Disposition: A | Discharge status: Home / Self Care | Payer: BLUE CROSS/BLUE SHIELD | Attending: Physician Assistant | Admitting: Physician Assistant

## 2017-03-02 DIAGNOSIS — T63441A Toxic effect of venom of bees, accidental (unintentional), initial encounter: Secondary | ICD-10-CM

## 2017-03-02 MED ORDER — CETIRIZINE HCL 5 MG/5ML PO SOLN: 10.0000 mg | Freq: Every day | ORAL | 0 refills | Status: DC

## 2017-03-02 MED ORDER — PREDNISONE 20 MG PO TABS: 40.0000 mg | ORAL_TABLET | Freq: Every day | ORAL | 0 refills | Status: DC

## 2017-03-02 NOTE — Discharge Instructions (Signed)
Keep the hand elevated and use ice to help with swelling and itching.

## 2017-03-02 NOTE — ED Provider Notes (Signed)
03/02/2017 1:49 PM   DOB: Oct 07, 1955 / MRN: [id number]  SUBJECTIVE:  Angel Frazier is a 61 y.o. male presenting for thumb iching and swelling that started after getting stung by a bee.  Otherwise he feels well.    Immunization History  Administered Date(s) Administered  . Influenza Split 02/15/2011  . Influenza, Seasonal, Injecte, Preservative Fre 05/21/2012  . Influenza,inj,Quad PF,6+ Mos 06/03/2013  . Td 06/04/2001  . Tdap 05/21/2012     He is allergic to mercaptopurine.   He  has a past medical history of  vitamin D deficiency (01/24/2013); Anabolic steroid abuse; Avascular necrosis of femoral head ([facility]); Bleeding hemorrhoids; Colitis (08/06/2013); Diabetes mellitus without complication ([facility]) (3300); Erectile dysfunction; Hypertension; Left sided ulcerative colitis ([facility]); and Spinal stenosis of lumbar region at multiple levels.    He  reports that he quit smoking about 16 years ago. His smoking use included Cigarettes. He has a 6.00 pack-year smoking history. He has never used smokeless tobacco. He reports that he drinks alcohol. He reports that he does not use drugs. He  has no sexual activity history on file. The patient  has a past surgical history that includes Knee arthroscopy (Right); Colonoscopy; and Sigmoidoscopy.  His family history includes Diabetes in his mother and unknown relative; Diabetes type I in his son; Hypertension in his unknown relative.  Review of Systems  Constitutional: Negative for chills, diaphoresis and fever.  Respiratory: Negative for cough, hemoptysis, sputum production, shortness of breath and wheezing.   Cardiovascular: Negative for chest pain, orthopnea and leg swelling.  Gastrointestinal: Negative for nausea.  Skin: Negative for rash.  Neurological: Negative for dizziness.    OBJECTIVE:  BP (!) 138/93   Pulse 63   Temp 97.9 F (36.6 C) (Oral)   Resp 16   SpO2 98%   Lab Results  Component Value Date   HGBA1C 5.9 12/13/2016     Physical  Exam  Constitutional: He appears well-developed. He is active and cooperative.  Non-toxic appearance.  Cardiovascular: Normal rate.   Pulmonary/Chest: Effort normal. No tachypnea.  Musculoskeletal:       Right wrist: He exhibits swelling. He exhibits normal range of motion, no tenderness, no bony tenderness, no effusion, no crepitus, no deformity and no laceration.  Neurological: He is alert.  Skin: Skin is warm and dry. He is not diaphoretic. No pallor.  Vitals reviewed.   No results found for this or any previous visit (from the past 72 hour(s)).  No results found.  ASSESSMENT AND PLAN:  The encounter diagnosis was Local reaction to bee sting, accidental or unintentional, initial encounter. No pain about the lesion with itching and swelling. TD current.     The patient is advised to call or return to clinic if he does not see an improvement in symptoms, or to seek the care of the closest emergency department if he worsens with the above plan.   Philis Fendt, MHS, PA-C 03/02/2017 1:49 PM    Tereasa Coop, PA-C 03/02/17 1349

## 2017-03-02 NOTE — ED Triage Notes (Signed)
C/O getting stung by bee in right thumb yesterday.  Today thumb swollen; swelling extending into hand and first now.  Denies any throat, oral, or breathing problems.

## 2017-03-15 ENCOUNTER — Encounter: Payer: Self-pay | Admitting: Gastroenterology

## 2017-03-19 ENCOUNTER — Ambulatory Visit (INDEPENDENT_AMBULATORY_CARE_PROVIDER_SITE_OTHER): Payer: BLUE CROSS/BLUE SHIELD | Admitting: Internal Medicine

## 2017-03-19 ENCOUNTER — Encounter: Payer: Self-pay | Admitting: Internal Medicine

## 2017-03-19 DIAGNOSIS — R918 Other nonspecific abnormal finding of lung field: Secondary | ICD-10-CM

## 2017-03-19 DIAGNOSIS — K5150 Left sided colitis without complications: Secondary | ICD-10-CM

## 2017-03-19 DIAGNOSIS — M87059 Idiopathic aseptic necrosis of unspecified femur: Secondary | ICD-10-CM

## 2017-03-19 NOTE — Assessment & Plan Note (Signed)
Judicious use steroids - as before doubt related to steroids

## 2017-03-19 NOTE — Patient Instructions (Signed)
  Please follow up with Dr Carlean Purl in a year or sooner if needed.    I appreciate the opportunity to care for you. Ronney Lion, [facility]

## 2017-03-19 NOTE — Assessment & Plan Note (Signed)
Await CT

## 2017-03-19 NOTE — Assessment & Plan Note (Signed)
Doing well now RTC 1 year

## 2017-03-19 NOTE — Progress Notes (Signed)
   Angel Frazier 61 y.o. May 13, 1956 [id number]  Assessment & Plan:  LEFT SIDED ULCERATIVE COLITIS Doing well now RTC 1 year  Pulmonary nodules Await CT  Avascular necrosis of femoral heads Judicious use steroids - as before doubt related to steroids  We talked about using a biologic agent or other medications to reduce the need for intermittent steroids but he is not interested in that at this time. He will stay on his mesalamine and let me know if he has problems again.    Subjective:   Chief Complaint:Left-sided ulcerative colitis  HPI Angel Frazier is here for follow-up, he has been on disability as his not been able to go back to work with his back problems. He is about the transition to Medicare I think. 2 times in the last year perhaps threes needed short courses of prednisone to control diarrhea and rectal bleeding. Last time was in August, he is off prednisone and doing well at this time without complaints. Medications, allergies past medical history past surgical history all reviewed in the EMR Review of Systems He is working out at the gym in general he is better but he was unable to go back to work. Says he misses the people there but not to labor itself.  Objective:   Physical Exam @BP  126/90   Pulse 68   Ht 5' 11"  (1.803 m)   Wt 244 lb (110.7 kg)   BMI 34.03 kg/m @  General:  NAD Eyes:   anicteric Lungs:  clear Heart::  S1S2 no rubs, murmurs or gallops Abdomen:  soft and nontender, BS+   15 minutes time spent with patient > half in counseling coordination of care

## 2017-03-28 ENCOUNTER — Telehealth: Payer: Self-pay | Admitting: Family Medicine

## 2017-03-28 NOTE — Telephone Encounter (Signed)
MEDICATION:  Accu check one touch  PHARMACY:   [id number] - [place], [place] - [place] [phone] ([phone] (Fax)     IS THIS A 90 DAY SUPPLY : N/A  IS PATIENT OUT OF MEDICATION: N/A  IF NOT; HOW MUCH IS LEFT: N/A  LAST APPOINTMENT DATE: @Visit  date not found  NEXT APPOINTMENT DATE:@1 /15/2019  OTHER COMMENTS: Patient stated he needs a "new machine" and for the accu check clarification Dr. Yong Channel can call insurance at 1 [phone]    **Let patient know to contact pharmacy at the end of the day to make sure medication is ready. **  ** Please notify patient to allow 48-72 hours to process**  **Encourage patient to contact the pharmacy for refills or they can request refills through [facility]**

## 2017-04-01 NOTE — Telephone Encounter (Signed)
Called patient and left a voicemail asking patient to call back and let me know how many times a day patient checks his blood sugar.

## 2017-04-09 ENCOUNTER — Other Ambulatory Visit: Payer: Self-pay

## 2017-04-09 MED ORDER — BLOOD GLUCOSE METER KIT: PACK | 0 refills | Status: AC

## 2017-04-09 NOTE — Telephone Encounter (Signed)
Left message on voicemail to call office.  

## 2017-04-09 NOTE — Telephone Encounter (Signed)
Patient states that he checks his blood sugar 4 times daily.

## 2017-04-09 NOTE — Telephone Encounter (Signed)
Prescription printed and faxed to pharmacy

## 2017-04-18 ENCOUNTER — Telehealth: Payer: Self-pay | Admitting: Family Medicine

## 2017-04-18 NOTE — Telephone Encounter (Signed)
error 

## 2017-04-29 ENCOUNTER — Ambulatory Visit (INDEPENDENT_AMBULATORY_CARE_PROVIDER_SITE_OTHER)
Admission: RE | Admit: 2017-04-29 | Discharge: 2017-04-29 | Disposition: A | Discharge status: Home / Self Care | Payer: Medicare Other | Source: Ambulatory Visit | Attending: Internal Medicine | Admitting: Internal Medicine

## 2017-04-29 DIAGNOSIS — R918 Other nonspecific abnormal finding of lung field: Secondary | ICD-10-CM

## 2017-04-29 NOTE — Progress Notes (Signed)
Spoke with pt and notified of results per Dr. Wert. Pt verbalized understanding and denied any questions. 

## 2017-05-02 ENCOUNTER — Telehealth: Payer: Self-pay | Admitting: Internal Medicine

## 2017-05-02 NOTE — Telephone Encounter (Signed)
Left message for patient to call back  

## 2017-05-07 MED ORDER — PREDNISONE 10 MG PO TABS: 40.0000 mg | ORAL_TABLET | Freq: Every day | ORAL | 0 refills | Status: DC

## 2017-05-07 NOTE — Telephone Encounter (Signed)
Patient returning phone call to [facility] best call back # is [phone].

## 2017-05-07 NOTE — Telephone Encounter (Signed)
No return call from the patient

## 2017-05-07 NOTE — Telephone Encounter (Signed)
Patient notified  rx sent He will call back if this fails to help

## 2017-05-07 NOTE — Telephone Encounter (Signed)
Patient with UC flare diarrhea multiple episodes a day.  He is also having flare of hemorrhoids.  5-6 BM a day. No recent antibiotics.  No bleeding, some urgency, and stools are watery. Currently on Lialda 1.2 gm 2 tabs BID.  Please advise

## 2017-05-07 NOTE — Addendum Note (Signed)
Addended by: Marlon Pel on: 05/07/2017 02:52 PM   Modules accepted: Orders

## 2017-05-07 NOTE — Telephone Encounter (Signed)
1) Prednisone 10 mg tabs # 70 no refill  Start at 4 tabs qd x 5 d, 3 tabs qd x 5 d, 2 tabs qd x 10 d, 1 tab qd x 10 d 1/2 tab qd x 10 d  Call back if this does not work out

## 2017-05-08 ENCOUNTER — Telehealth: Payer: Self-pay

## 2017-05-08 MED ORDER — HYDROCORTISONE 2.5 % RE CREA: 1.0000 "application " | TOPICAL_CREAM | Freq: Two times a day (BID) | RECTAL | 1 refills | Status: DC

## 2017-05-08 NOTE — Telephone Encounter (Signed)
Patient notified  Rx sent

## 2017-05-08 NOTE — Telephone Encounter (Signed)
-----   Message from Gatha Mayer, MD sent at 05/08/2017 10:22 AM EST ----- Regarding: forgot hc cream rx I forgot 2.5% HC cream bid to rectum for his hemorrhoids if he wants # 30 g 1 refill

## 2017-06-14 ENCOUNTER — Ambulatory Visit: Payer: Medicare Other | Admitting: Endocrinology

## 2017-06-14 ENCOUNTER — Encounter: Payer: Self-pay | Admitting: Endocrinology

## 2017-06-14 VITALS — BP 148/108 | HR 64 | Temp 97.5°F | Wt 251.0 lb

## 2017-06-14 DIAGNOSIS — E119 Type 2 diabetes mellitus without complications: Secondary | ICD-10-CM | POA: Diagnosis not present

## 2017-06-14 LAB — POCT GLYCOSYLATED HEMOGLOBIN (HGB A1C): HEMOGLOBIN A1C: 5.9

## 2017-06-14 NOTE — Patient Instructions (Addendum)
check your blood sugar once a day.  vary the time of day when you check, between before the 3 meals, and at bedtime.  also check if you have symptoms of your blood sugar being too high or too low.  please keep a record of the readings and bring it to your next appointment here (or you can bring the meter itself).  You can write it on any piece of paper.  please call us sooner if your blood sugar goes below 70, or if you have a lot of readings over 200.  You can stay off the insulin for now.  However, with time, the need for insulin will return.  The best way to delay that is to lose weight.  Please continue the same medication.  Please come back for a follow-up appointment in 6 months.

## 2017-06-14 NOTE — Progress Notes (Signed)
Subjective:    Patient ID: Angel Frazier, male    DOB: [birth year], 62 y.o.   MRN: [id number]  HPI Pt returns for f/u of diabetes mellitus: DM type: 1, in remission Dx'ed: 2956 Complications: none Therapy: 2 oral meds.  DKA: never Severe hypoglycemia: never Pancreatitis: never Pancreatic imaging: normal on 2017 CT Other: he has been in remission since soon after dx. He took insulin for a brief time after dx.   Interval history:  He says cbg's are still well-controlled, off insulin.  pt states he feels well in general.   Past Medical History:  Diagnosis Date  .  vitamin D deficiency 01/24/2013   01/23/13 - level 29 - supplements 50k IU weekly x 6 recommended   . Anabolic steroid abuse   . Avascular necrosis of femoral head (HCC)    bilateral  . Bleeding hemorrhoids   . Colitis 08/06/2013   Mildly actibr colitis  . Diabetes mellitus without complication ([place]) 2130   type 2  . Drug-induced acute pancreatitis - 6MP 09/07/2015  . Erectile dysfunction   . Hypertension   . Left sided ulcerative colitis ([place])   . Spinal stenosis of lumbar region at multiple levels     Past Surgical History:  Procedure Laterality Date  . COLONOSCOPY     multiple  . KNEE ARTHROSCOPY Right   . SIGMOIDOSCOPY      Social History   Socioeconomic History  . Marital status: Single    Spouse name: Not on file  . Number of children: 1  . Years of education: Not on file  . Highest education level: Not on file  Social Needs  . Financial resource strain: Not on file  . Food insecurity - worry: Not on file  . Food insecurity - inability: Not on file  . Transportation needs - medical: Not on file  . Transportation needs - non-medical: Not on file  Occupational History    Employer: [employer]  Tobacco Use  . Smoking status: Former Smoker    Packs/day: 1.00    Years: 6.00    Pack years: 6.00    Types: Cigarettes    Last attempt to quit: 06/04/2000    Years since quitting: 17.0  .  Smokeless tobacco: Never Used  Substance and Sexual Activity  . Alcohol use: Yes    Comment: occasionally  . Drug use: No  . Sexual activity: Not on file  Other Topics Concern  . Not on file  Social History Narrative   Single never married. 1 son 25 in 2018. 1 grandson 93 in 2018. 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 Ascorbic Acid (VITAMIN C) 1000 MG tablet Take 1,000 mg by mouth daily. Reported on 09/28/2015    . aspirin 81 MG tablet Take 81 mg by mouth daily. Reported on 09/28/2015    . blood glucose meter kit and supplies Dispense based on patient and insurance preference. Use up to four times daily as directed. (FOR ICD-9 250.00, 250.01). 1 each 0  . Calcium Carbonate-Vitamin D (CALCIUM-VITAMIN D3 PO) Take 1 tablet by mouth daily. Reported on 09/28/2015    . cetirizine HCl (ZYRTEC) 5 MG/5ML SOLN Take 10 mLs (10 mg total)  by mouth daily. 300 mL 0  . Cholecalciferol (VITAMIN D3) 50000 UNITS CAPS Take 1 tablet by mouth once a week. 6 capsule 0  . cyclobenzaprine (FLEXERIL) 10 MG tablet Take 10 mg by mouth 3 (three) times daily as needed for muscle spasms. Reported on 09/26/2015    . diphenoxylate-atropine (LOMOTIL) 2.5-0.025 MG tablet Take 1 tablet by mouth every 6 (six) hours as needed for diarrhea or loose stools. 60 tablet 0  . hydrocortisone (ANUSOL-HC) 2.5 % rectal cream Place 1 application rectally 2 (two) times daily. 30 g 1  . mesalamine (LIALDA) 1.2 g EC tablet Take 2 tablets (2.4 g total) by mouth 2 (two) times daily. 360 tablet 3  . METHOCARBAMOL PO Take 500 mg by mouth daily as needed (for muscle spasms).     . Multiple Vitamins-Minerals (CENTRUM SILVER PO) Take 1 tablet by mouth daily. Reported on 09/28/2015    . Omega-3 Fatty Acids (FISH OIL) 300 MG CAPS Take 300  mg by mouth daily.     . pioglitazone-metformin (ACTOPLUS MET) 15-850 MG tablet Take 1 tablet by mouth daily. 90 tablet 3  . vitamin B-12 (CYANOCOBALAMIN) 1000 MCG tablet Take 2,500 mcg by mouth daily. Reported on 09/28/2015     No current facility-administered medications on file prior to visit.     Allergies  Allergen Reactions  . Mercaptopurine Other (See Comments)    Caused Pancreatitis    Family History  Problem Relation Age of Onset  . Diabetes Mother   . Diabetes Unknown        cousin  . Hypertension Unknown   . Diabetes type I Son     BP (!) 148/108 (BP Location: Left Arm, Patient Position: Sitting, Cuff Size: Normal)   Pulse 64   Temp (!) 97.5 F (36.4 C) (Oral)   Wt 251 lb (113.9 kg)   SpO2 98%   BMI 35.01 kg/m    Review of Systems Denies weight change.      Objective:   Physical Exam VITAL SIGNS:  See vs page GENERAL: no distress Pulses: dorsalis pedis intact bilat.   MSK: no deformity of the feet CV: no leg edema Skin:  no ulcer on the feet.  normal color and temp on the feet.   Neuro: sensation is intact to touch on the feet.    Lab Results  Component Value Date   HGBA1C 5.9 06/14/2017      Assessment & Plan:  Type 1 DM, in remission.   HTN: prob situational: recheck next time.   Patient Instructions  check your blood sugar once a day.  vary the time of day when you check, between before the 3 meals, and at bedtime.  also check if you have symptoms of your blood sugar being too high or too low.  please keep a record of the readings and bring it to your next appointment here (or you can bring the meter itself).  You can write it on any piece of paper.  please call us sooner if your blood sugar goes below 70, or if you have a lot of readings over 200.  You can stay off the insulin for now.  However, with time, the need for insulin will return.  The best way to delay that is to lose weight.  Please continue the same medication.  Please come back for  a follow-up appointment in 6 months.

## 2017-06-17 NOTE — Progress Notes (Signed)
Subjective:   Angel Frazier is a 62 y.o. male who presents for an Initial Medicare Annual Wellness Visit.  Lives alone in one story single family home.   Review of Systems  No ROS.  Medicare Wellness Visit. Additional risk factors are reflected in the social history.  Sleeps 6-8 hrs/night. Mostly wakes up feeling rested. Does not nap during day.  Cardiac Risk Factors include: advanced age (>102mn, >>30women);diabetes mellitus;dyslipidemia;male gender;obesity (BMI >30kg/m2);hypertension    Objective:    Today's Vitals   06/18/17 1022  BP: 138/90  Pulse: 74  Resp: 16  SpO2: 98%  Weight: 256 lb 14.4 oz (116.5 kg)  Height: 5' 9"  (1.753 m)   Body mass index is 37.94 kg/m.  Advanced Directives 06/18/2017 09/13/2015 08/20/2015  Does Patient Have a Medical Advance Directive? No No No  Would patient like information on creating a medical advance directive? Yes (MAU/Ambulatory/Procedural Areas - Information given) Yes - Educational materials given No - patient declined information  Patient did not have any Advanced Directives completed. I gave patient the documents and a handout discussing Advanced Directives. We discussed and went through the [place] questions were answered. Total time spent on topic was 17 minutes.   Current Medications (verified) Outpatient Encounter Medications as of 06/18/2017  Medication Sig  . Ascorbic Acid (VITAMIN C) 1000 MG tablet Take 1,000 mg by mouth daily. Reported on 09/28/2015  . aspirin 81 MG tablet Take 81 mg by mouth daily. Reported on 09/28/2015  . blood glucose meter kit and supplies Dispense based on patient and insurance preference. Use up to four times daily as directed. (FOR ICD-9 250.00, 250.01).  . Calcium Carbonate-Vitamin D (CALCIUM-VITAMIN D3 PO) Take 1 tablet by mouth daily. Reported on 09/28/2015  . cetirizine HCl (ZYRTEC) 5 MG/5ML SOLN Take 10 mLs (10 mg total) by mouth daily.  . Cholecalciferol (VITAMIN D3)  50000 UNITS CAPS Take 1 tablet by mouth once a week.  . cyclobenzaprine (FLEXERIL) 10 MG tablet Take 10 mg by mouth 3 (three) times daily as needed for muscle spasms. Reported on 09/26/2015  . diphenoxylate-atropine (LOMOTIL) 2.5-0.025 MG tablet Take 1 tablet by mouth every 6 (six) hours as needed for diarrhea or loose stools.  . hydrocortisone (ANUSOL-HC) 2.5 % rectal cream Place 1 application rectally 2 (two) times daily.  . mesalamine (LIALDA) 1.2 g EC tablet Take 2 tablets (2.4 g total) by mouth 2 (two) times daily.  .Marland KitchenMETHOCARBAMOL PO Take 500 mg by mouth daily as needed (for muscle spasms).   . Multiple Vitamins-Minerals (CENTRUM SILVER PO) Take 1 tablet by mouth daily. Reported on 09/28/2015  . Omega-3 Fatty Acids (FISH OIL) 300 MG CAPS Take 300 mg by mouth daily.   . pioglitazone-metformin (ACTOPLUS MET) 15-850 MG tablet Take 1 tablet by mouth daily.  . vitamin B-12 (CYANOCOBALAMIN) 1000 MCG tablet Take 2,500 mcg by mouth daily. Reported on 09/28/2015   No facility-administered encounter medications on file as of 06/18/2017.     Allergies (verified) Mercaptopurine   History: Past Medical History:  Diagnosis Date  .  vitamin D deficiency 01/24/2013   01/23/13 - level 29 - supplements 50k IU weekly x 6 recommended   . Anabolic steroid abuse   . Avascular necrosis of femoral head (HCC)    bilateral  . Bleeding hemorrhoids   . Colitis 08/06/2013   Mildly actibr colitis  . [place] [address]  type 2  . Drug-induced acute pancreatitis -  6MP 09/07/2015  . Erectile dysfunction   . Hypertension   . Left sided ulcerative colitis ([place])   . Spinal stenosis of lumbar region at multiple levels    Past Surgical History:  Procedure Laterality Date  . COLONOSCOPY     multiple  . KNEE ARTHROSCOPY Right   . SIGMOIDOSCOPY     Family History  Problem Relation Age of Onset  . Diabetes Mother   . Diabetes Unknown        cousin  . Hypertension Unknown   .  Diabetes type I Son    Social History   Socioeconomic History  . Marital status: Single    Spouse name: None  . Number of children: 1  . Years of education: None  . Highest education level: None  Social Needs  . Financial resource strain: None  . Food insecurity - worry: None  . Food insecurity - inability: None  . Transportation needs - medical: None  . Transportation needs - non-medical: None  Occupational History  . Occupation: retired    Fish farm manager: [employer]  Tobacco Use  . Smoking status: Former Smoker    Packs/day: 1.00    Years: 6.00    Pack years: 6.00    Types: Cigarettes    Last attempt to quit: 06/04/2000    Years since quitting: 17.0  . Smokeless tobacco: Never Used  Substance and Sexual Activity  . Alcohol use: Yes    Comment: occasionally  . Drug use: No  . Sexual activity: None  Other Topics Concern  . None  Social History Narrative   Single never married. 1 son 85 in 2018. 1 grandson 66 in 2018. 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 N  Vision? N  Difficulty concentrating or making decisions? N  Walking or climbing stairs? N  Dressing or bathing? N  Doing errands, shopping? N  Preparing Food and eating ? N  Using the Toilet? N  In the past six months, have you accidently leaked urine? N  Do you have problems with loss of bowel control? N  Managing your Medications? N  Managing your Finances? N  Housekeeping or managing your Housekeeping? N  Some recent data might be hidden     Immunizations and Health Maintenance Immunization History  Administered Date(s) Administered  . Influenza Split 02/15/2011  . Influenza,  Seasonal, Injecte, Preservative Fre 05/21/2012  . Influenza,inj,Quad PF,6+ Mos 06/03/2013  . Td 06/04/2001  . Tdap 05/21/2012   There are no preventive care reminders to display for this patient.  Patient Care Team: [name], MD as PCP - General (Family Medicine)  Indicate any recent Medical Services you may have received from other than [facility] providers in the past year (date may be approximate).    Assessment:   This is a routine wellness examination for Angel Frazier.  Hearing/Vision screen  Hearing Screening   125Hz  250Hz  500Hz  1000Hz  2000Hz  3000Hz  4000Hz  6000Hz  8000Hz   Right ear:   40 40 40  0    Left ear:   40 40 40  40    Vision Screening Comments: 08/2016 [employer]. Wears bifocals.  Dietary issues and exercise activities discussed: Current Exercise Habits: Home exercise routine, Type of exercise: walking;strength training/weights, Intensity: Moderate, Exercise limited by: None identified  Eats 3 meals/day. Patient states he is trying to eliminate snacks. States he is trying to lower his carbs.  Oatmeal for breakfast. Sandwich and chips for lunch. Dinner is rice, vegetable and a meat. Drinks 1/2 large water bottles/day. Most meals cooked at home. Discussed diabetic diet with patient and importance of eating low glycemic foods. Patient stated understanding.  Goals    . Maintain current health status      Depression Screen PHQ 2/9 Scores 06/18/2017 09/13/2015  PHQ - 2 Score 0 0  PHQ- 9 Score 0 -  PHQ2 and PHQ9 completed. No signs or symptoms of depression. Total time spent on topic was 13 minutes. Fall Risk Fall Risk  06/18/2017 09/13/2015  Falls in the past year? No No   Cognitive Function: Ad8 score reviewed for issues:  Issues making decisions:no  Less interest in hobbies / activities:no  Repeats questions, stories (family complaining):no  Trouble using ordinary gadgets (microwave, computer, phone):no  Forgets the month or year: no  Mismanaging finances:  no  Remembering appts:no  Daily problems with thinking and/or memory:no Ad8 score is=0 States that he reads his daily bible reading and then once a week participates in bible study.         Screening Tests Health Maintenance  Topic Date Due  . INFLUENZA VACCINE  02/18/2018 (Originally 01/02/2017)  . PNEUMOCOCCAL POLYSACCHARIDE VACCINE (1) 09/25/2025 (Originally 03/29/1958)  . URINE MICROALBUMIN  08/30/2017  . OPHTHALMOLOGY EXAM  11/02/2017  . HEMOGLOBIN A1C  12/12/2017  . FOOT EXAM  06/14/2018  . COLONOSCOPY  08/07/2018  . TETANUS/TDAP  05/21/2022  . Hepatitis C Screening  Completed  . HIV Screening  Completed      Plan:   Follow up with PCP as directed.  I have personally reviewed and noted the following in the patient's chart:   . Medical and social history . Use of alcohol, tobacco or illicit drugs  . Current medications and supplements . Functional ability and status . Nutritional status . Physical activity . Advanced directives . List of other physicians . Vitals . Screenings to include cognitive, depression, and falls . Referrals and appointments  In addition, I have reviewed and discussed with patient certain preventive protocols, quality metrics, and best practice recommendations. A written personalized care plan for preventive services as well as general preventive health recommendations were provided to patient.     Williemae Area, RN   06/18/2017

## 2017-06-17 NOTE — Progress Notes (Signed)
PCP notes:   Health maintenance: Flu: declined.   Abnormal screenings: BP 138/90. I asked patient to check his blood pressures regularly and write them down to bring do his next appointment. I scheduled patient for 30 minute visit 09/11/17 for his yearly follow up and blood work. I told him if his blood pressure gets higher then he needs to schedule a closer visit for just discussion of blood pressure.     Patient concerns: NA.   Nurse concerns: NA.   Next PCP appt: 09/11/17.

## 2017-06-17 NOTE — Progress Notes (Signed)
Pre visit review using our clinic review tool, if applicable. No additional management support is needed unless otherwise documented below in the visit note. 

## 2017-06-18 ENCOUNTER — Ambulatory Visit (INDEPENDENT_AMBULATORY_CARE_PROVIDER_SITE_OTHER): Payer: Medicare Other | Admitting: *Deleted

## 2017-06-18 ENCOUNTER — Encounter: Payer: Self-pay | Admitting: *Deleted

## 2017-06-18 VITALS — BP 138/90 | HR 74 | Resp 16 | Ht 69.0 in | Wt 256.9 lb

## 2017-06-18 DIAGNOSIS — Z0000 Encounter for general adult medical examination without abnormal findings: Secondary | ICD-10-CM | POA: Diagnosis not present

## 2017-06-18 NOTE — Patient Instructions (Addendum)
Angel Frazier , Thank you for taking time to come for your Medicare Wellness Visit. I appreciate your ongoing commitment to your health goals. Please review the following plan we discussed and let me know if I can assist you in the future.   These are the goals we discussed: Goals    . Maintain current health status       This is a list of the screening recommended for you and due dates:  Health Maintenance  Topic Date Due  . Flu Shot  02/18/2018*  . Pneumococcal vaccine (1) 09/25/2025*  . Urine Protein Check  08/30/2017  . Eye exam for diabetics  11/02/2017  . Hemoglobin A1C  12/12/2017  . Complete foot exam   06/14/2018  . Colon Cancer Screening  08/07/2018  . Tetanus Vaccine  05/21/2022  .  Hepatitis C: One time screening is recommended by Center for Disease Control  (CDC) for  adults born from [birth year] through 1967.   Completed  . HIV Screening  Completed  *Topic was postponed. The date shown is not the original due date.   Preventive Care for Adults  A healthy lifestyle and preventive care can promote health and wellness. Preventive health guidelines for adults include the following key practices.  . A routine yearly physical is a good way to check with your health care provider about your health and preventive screening. It is a chance to share any concerns and updates on your health and to receive a thorough exam.  . Visit your dentist for a routine exam and preventive care every 6 months. Brush your teeth twice a day and floss once a day. Good oral hygiene prevents tooth decay and gum disease.  . The frequency of eye exams is based on your age, health, family medical history, use  of contact lenses, and other factors. Follow your health care provider's recommendations for frequency of eye exams.  . Eat a healthy diet. Foods like vegetables, fruits, whole grains, low-fat dairy products, and lean protein foods contain the nutrients you need without too many calories. Decrease your  intake of foods high in solid fats, added sugars, and salt. Eat the right amount of calories for you. Get information about a proper diet from your health care provider, if necessary.  . Regular physical exercise is one of the most important things you can do for your health. Most adults should get at least 150 minutes of moderate-intensity exercise (any activity that increases your heart rate and causes you to sweat) each week. In addition, most adults need muscle-strengthening exercises on 2 or more days a week.  Silver Sneakers may be a benefit available to you. To determine eligibility, you may visit the website: www.silversneakers.com or contact program at (832)608-1214 Mon-Fri between 8AM-8PM.   . Maintain a healthy weight. The body mass index (BMI) is a screening tool to identify possible weight problems. It provides an estimate of body fat based on height and weight. Your health care provider can find your BMI and can help you achieve or maintain a healthy weight.   For adults 20 years and older: ? A BMI below 18.5 is considered underweight. ? A BMI of 18.5 to 24.9 is normal. ? A BMI of 25 to 29.9 is considered overweight. ? A BMI of 30 and above is considered obese.   . Maintain normal blood lipids and cholesterol levels by exercising and minimizing your intake of saturated fat. Eat a balanced diet with plenty of fruit and vegetables. Blood  tests for lipids and cholesterol should begin at age 51 and be repeated every 5 years. If your lipid or cholesterol levels are high, you are over 50, or you are at high risk for heart disease, you may need your cholesterol levels checked more frequently. Ongoing high lipid and cholesterol levels should be treated with medicines if diet and exercise are not working.  . If you smoke, find out from your health care provider how to quit. If you do not use tobacco, please do not start.  . If you choose to drink alcohol, please do not consume more than 2  drinks per day. One drink is considered to be 12 ounces (355 mL) of beer, 5 ounces (148 mL) of wine, or 1.5 ounces (44 mL) of liquor.  . If you are 49-67 years old, ask your health care provider if you should take aspirin to prevent strokes.  . Use sunscreen. Apply sunscreen liberally and repeatedly throughout the day. You should seek shade when your shadow is shorter than you. Protect yourself by wearing long sleeves, pants, a wide-brimmed hat, and sunglasses year round, whenever you are outdoors.  . Once a month, do a whole body skin exam, using a mirror to look at the skin on your back. Tell your health care provider of new moles, moles that have irregular borders, moles that are larger than a pencil eraser, or moles that have changed in shape or color.

## 2017-06-18 NOTE — Progress Notes (Signed)
I have reviewed and agree with note, evaluation, plan. Thankful for BP monitoring advice and advised follow up  [name], MD

## 2017-07-30 DIAGNOSIS — M1712 Unilateral primary osteoarthritis, left knee: Secondary | ICD-10-CM | POA: Diagnosis not present

## 2017-08-22 ENCOUNTER — Other Ambulatory Visit: Payer: Self-pay | Admitting: Family Medicine

## 2017-09-11 ENCOUNTER — Encounter: Payer: Self-pay | Admitting: Family Medicine

## 2017-09-11 ENCOUNTER — Ambulatory Visit (INDEPENDENT_AMBULATORY_CARE_PROVIDER_SITE_OTHER): Payer: Medicare Other | Admitting: Family Medicine

## 2017-09-11 VITALS — BP 132/92 | HR 56 | Temp 98.3°F | Ht 69.0 in | Wt 247.6 lb

## 2017-09-11 DIAGNOSIS — E559 Vitamin D deficiency, unspecified: Secondary | ICD-10-CM | POA: Diagnosis not present

## 2017-09-11 DIAGNOSIS — K5150 Left sided colitis without complications: Secondary | ICD-10-CM

## 2017-09-11 DIAGNOSIS — R918 Other nonspecific abnormal finding of lung field: Secondary | ICD-10-CM

## 2017-09-11 DIAGNOSIS — Z125 Encounter for screening for malignant neoplasm of prostate: Secondary | ICD-10-CM

## 2017-09-11 DIAGNOSIS — Z0000 Encounter for general adult medical examination without abnormal findings: Secondary | ICD-10-CM | POA: Diagnosis not present

## 2017-09-11 DIAGNOSIS — E119 Type 2 diabetes mellitus without complications: Secondary | ICD-10-CM | POA: Diagnosis not present

## 2017-09-11 LAB — COMPREHENSIVE METABOLIC PANEL
ALBUMIN: 4.3 g/dL (ref 3.5–5.2)
ALK PHOS: 38 U/L — AB (ref 39–117)
ALT: 21 U/L (ref 0–53)
AST: 26 U/L (ref 0–37)
BILIRUBIN TOTAL: 0.9 mg/dL (ref 0.2–1.2)
BUN: 16 mg/dL (ref 6–23)
CO2: 30 mEq/L (ref 19–32)
Calcium: 9.4 mg/dL (ref 8.4–10.5)
Chloride: 101 mEq/L (ref 96–112)
Creatinine, Ser: 0.97 mg/dL (ref 0.40–1.50)
GFR: 100.38 mL/min (ref >60.00)
Glucose, Bld: 103 mg/dL — ABNORMAL HIGH (ref 70–99)
Potassium: 3.9 mEq/L (ref 3.5–5.1)
Sodium: 139 mEq/L (ref 135–145)
TOTAL PROTEIN: 7.3 g/dL (ref 6.0–8.3)

## 2017-09-11 LAB — CBC
HEMATOCRIT: 41.2 % (ref 39.0–52.0)
HEMOGLOBIN: 14 g/dL (ref 13.0–17.0)
MCHC: 33.9 g/dL (ref 30.0–36.0)
MCV: 94.1 fl (ref 78.0–100.0)
PLATELETS: 209 10*3/uL (ref 150.0–400.0)
RBC: 4.38 Mil/uL (ref 4.22–5.81)
RDW: 13.5 % (ref 11.5–15.5)
WBC: 4.3 10*3/uL (ref 4.0–10.5)

## 2017-09-11 LAB — LIPID PANEL
CHOLESTEROL: 174 mg/dL (ref 0–200)
HDL: 50.2 mg/dL (ref >39.00)
LDL Cholesterol: 112 mg/dL — ABNORMAL HIGH (ref 0–99)
NONHDL: 123.84
TRIGLYCERIDES: 60 mg/dL (ref 0.0–149.0)
Total CHOL/HDL Ratio: 3
VLDL: 12 mg/dL (ref 0.0–40.0)

## 2017-09-11 LAB — PSA: PSA: 1.17 ng/mL (ref 0.10–4.00)

## 2017-09-11 LAB — VITAMIN D 25 HYDROXY (VIT D DEFICIENCY, FRACTURES): VITD: 52.95 ng/mL (ref 30.00–100.00)

## 2017-09-11 LAB — HEMOGLOBIN A1C: Hgb A1c MFr Bld: 6.3 % (ref 4.6–6.5)

## 2017-09-11 LAB — MICROALBUMIN / CREATININE URINE RATIO
CREATININE, U: 155.9 mg/dL
MICROALB/CREAT RATIO: 1.7 mg/g (ref 0.0–30.0)
Microalb, Ur: 2.7 mg/dL — ABNORMAL HIGH (ref 0.0–1.9)

## 2017-09-11 NOTE — Assessment & Plan Note (Signed)
continues to follow with Dr. Ebony Hail.  He is on a combination of Actos and metformin 15-872m once a day.  In the past he was on insulin but drastically improved diet and exercise and improved A1c. Will check a1c as next Dr. ELoanne Drillingvisit is july Lab Results  Component Value Date   HGBA1C 5.9 06/14/2017   HGBA1C 5.9 12/13/2016   HGBA1C 6.9 (H) 08/30/2016   Patient's LDL has been pretty reasonable at 83. We did discuss could lower his heart risk particularly in light of his diabetes- with perhaps once a week statin- we will consider if LDL remains above 70

## 2017-09-11 NOTE — Assessment & Plan Note (Signed)
he remains on Lialda. No blood in stool or abdominal cramping.  Follows with Dr. Carlean Purl- last seen October 2018

## 2017-09-11 NOTE — Assessment & Plan Note (Signed)
repeat CT scan planned for November 2020.  We will follow-up at next years physical.  He is on a recall que through pulmonary- Dr. Melvyn Novas

## 2017-09-11 NOTE — Progress Notes (Signed)
Phone: [phone]  Subjective:  Patient presents today for their annual physical. Chief complaint-noted.   See problem oriented charting- ROS- full  review of systems was completed and negative including no blood in stool or chest pain  The following were reviewed and entered/updated in epic: Past Medical History:  Diagnosis Date  .  vitamin D deficiency 01/24/2013   01/23/13 - level 29 - supplements 50k IU weekly x 6 recommended   . Anabolic steroid abuse   . Avascular necrosis of femoral head (HCC)    bilateral  . Bleeding hemorrhoids   . Colitis 08/06/2013   Mildly actibr colitis  . Diabetes mellitus without complication ([place]) 5462   type 2  . Drug-induced acute pancreatitis - 6MP 09/07/2015  . Erectile dysfunction   . Hypertension   . Left sided ulcerative colitis ([place])   . Spinal stenosis of lumbar region at multiple levels    Patient Active Problem List   Diagnosis Date Noted  . Controlled type 2 diabetes mellitus ([place]) 08/31/2015    Priority: High  . LEFT SIDED ULCERATIVE COLITIS 08/11/2008    Priority: High  . Pulmonary nodules 09/07/2015    Priority: Medium  . Long term current use of systemic steroids 11/18/2014    Priority: Low  . Avascular necrosis of femoral heads 11/18/2014    Priority: Low  . Hemorrhoids, internal, with bleeding 06/22/2014    Priority: Low  . Vitamin D deficiency 01/24/2013    Priority: Low  . ED (erectile dysfunction) 05/21/2012    Priority: Low   Past Surgical History:  Procedure Laterality Date  . COLONOSCOPY     multiple  . KNEE ARTHROSCOPY Right   . SIGMOIDOSCOPY      Family History  Problem Relation Age of Onset  . Diabetes Mother   . Diabetes Unknown        cousin  . Hypertension Unknown   . Diabetes type I Son     Medications- reviewed and updated Current Outpatient Medications  Medication Sig Dispense Refill  . ACCU-CHEK AVIVA PLUS test strip TEST FOUR TIMES A DAY AS DIRECTED BY PRESCRIBER 350 each 0  .  ACCU-CHEK SOFTCLIX LANCETS lancets TEST FOUR TIMES A DAY AS DIRECTED BY PRESCRIBER 300 each 0  . Ascorbic Acid (VITAMIN C) 1000 MG tablet Take 1,000 mg by mouth daily. Reported on 09/28/2015    . aspirin 81 MG tablet Take 81 mg by mouth daily. Reported on 09/28/2015    . blood glucose meter kit and supplies Dispense based on patient and insurance preference. Use up to four times daily as directed. (FOR ICD-9 250.00, 250.01). 1 each 0  . Calcium Carbonate-Vitamin D (CALCIUM-VITAMIN D3 PO) Take 1 tablet by mouth daily. Reported on 09/28/2015    . cetirizine HCl (ZYRTEC) 5 MG/5ML SOLN Take 10 mLs (10 mg total) by mouth daily. 300 mL 0  . cyclobenzaprine (FLEXERIL) 10 MG tablet Take 10 mg by mouth 3 (three) times daily as needed for muscle spasms. Reported on 09/26/2015    . diphenoxylate-atropine (LOMOTIL) 2.5-0.025 MG tablet Take 1 tablet by mouth every 6 (six) hours as needed for diarrhea or loose stools. 60 tablet 0  . hydrocortisone (ANUSOL-HC) 2.5 % rectal cream Place 1 application rectally 2 (two) times daily. 30 g 1  . mesalamine (LIALDA) 1.2 g EC tablet Take 2 tablets (2.4 g total) by mouth 2 (two) times daily. 360 tablet 3  . METHOCARBAMOL PO Take 500 mg by mouth daily as needed (for muscle spasms).     Marland Kitchen  Multiple Vitamins-Minerals (CENTRUM SILVER PO) Take 1 tablet by mouth daily. Reported on 09/28/2015    . Omega-3 Fatty Acids (FISH OIL) 300 MG CAPS Take 300 mg by mouth daily.     . pioglitazone-metformin (ACTOPLUS MET) 15-850 MG tablet Take 1 tablet by mouth daily. 90 tablet 3  . vitamin B-12 (CYANOCOBALAMIN) 1000 MCG tablet Take 2,500 mcg by mouth daily. Reported on 09/28/2015     No current facility-administered medications for this visit.     Allergies-reviewed and updated Allergies  Allergen Reactions  . Mercaptopurine Other (See Comments)    Caused Pancreatitis    Social History   Social History Narrative   Single never married. 1 son 55 in 2018. 1 grandson 59 in 2018. 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 Oropharynx normal Neck: no thyromegaly CV: RRR no murmurs rubs or gallops Lungs: CTAB no crackles, wheeze, rhonchi Abdomen: soft/nontender/nondistended/normal bowel sounds. No rebound or guarding.  Ext: no edema Skin: warm, dry Neuro: grossly normal, moves all extremities, PERRLA Rectal: normal tone, normal sized prostate- upper ends of normal though, no masses or tenderness  Assessment/Plan:  62 y.o. male presenting for annual physical.  Health Maintenance counseling: 1. Anticipatory guidance: Patient counseled regarding regular dental exams -q6 months, eye exams -yearly, wearing seatbelts.  2. Risk factor reduction:  Advised patient of need for regular exercise and diet rich and fruits and vegetables to reduce risk of heart attack and stroke. Exercise- 1-2 x a week now. Diet- wants to get to about 225- he has already lost 9 lbs since January 15th- congratulated on effort. No late night snacking is helping.  Wt Readings from Last 3 Encounters:  09/11/17 247 lb 9.6 oz (112.3 kg)  06/18/17 256 lb 14.4 oz (116.5 kg)  06/14/17 251 lb (113.9 kg)  3. Immunizations/screenings/ancillary studies- has felt super exhausted from immunizations in the past. He would prefer to wait on pneumonia shot to 63 so he only needs 2 shots. Opts out of shingrix due to issues in the past.  Immunization History  Administered Date(s) Administered  . Influenza Split 02/15/2011  . Influenza, Seasonal, Injecte, Preservative Fre 05/21/2012  .  Influenza,inj,Quad PF,6+ Mos 06/03/2013  . Td 06/04/2001  . Tdap 05/21/2012  4. Prostate cancer screening-  low risk rectal exam- nocturia once a night, update psa today Lab Results  Component Value Date   PSA 0.98 09/07/2016   PSA 0.90 07/20/2014   PSA 0.66 05/20/2013   5. Colon cancer screening - March 2015 with 5-year follow-up  Status of chronic or acute concerns   Vitamin D deficiency will update today. Takes Vitamin D supplement daily.    Controlled type 2 diabetes mellitus ([place]) continues to follow with Dr. Ebony Hail.  He is on a combination of Actos and metformin 15-834m once a day.  In the past he was on insulin but drastically improved diet and exercise and improved A1c. Will  check a1c as next Dr. Loanne Drilling visit is july Lab Results  Component Value Date   HGBA1C 5.9 06/14/2017   HGBA1C 5.9 12/13/2016   HGBA1C 6.9 (H) 08/30/2016   Patient's LDL has been pretty reasonable at 83. We did discuss could lower his heart risk particularly in light of his diabetes- with perhaps once a week statin- we will consider if LDL remains above 70  LEFT SIDED ULCERATIVE COLITIS he remains on Lialda. No blood in stool or abdominal cramping.  Follows with Dr. Carlean Purl- last seen October 2018  Pulmonary nodules repeat CT scan planned for November 2020.  We will follow-up at next years physical.  He is on a recall que through pulmonary- Dr. Melvyn Novas  Future Appointments  Date Time Provider [place]  12/16/2017  8:45 AM Renato Shin, MD [facility] None   Offered 6 months, definitely 1 year for CPE  Lab/Order associations: Preventative health care - Plan: PSA, VITAMIN D 25 Hydroxy (Vit-D Deficiency, Fractures), CBC, Comprehensive metabolic panel, Lipid panel, Hemoglobin A1c, Microalbumin / creatinine urine ratio  Controlled type 2 diabetes mellitus without complication, without long-term current use of insulin (HCC) - Plan: CBC, Comprehensive metabolic panel, Lipid panel, Hemoglobin  A1c, Microalbumin / creatinine urine ratio  LEFT SIDED ULCERATIVE COLITIS - Plan: CBC, Comprehensive metabolic panel  Vitamin D deficiency - Plan: VITAMIN D 25 Hydroxy (Vit-D Deficiency, Fractures)  Screening for prostate cancer - Plan: PSA  Return precautions advised.  [name], MD

## 2017-09-11 NOTE — Patient Instructions (Addendum)
Please stop by lab before you go  [place] job shedding a few lbs and I like your goal of getting closer to 220!   Definitely see me once a year but happy to see you in 6 months. Go ahead and sign up for next years physical so we have a date on the books.

## 2017-09-11 NOTE — Assessment & Plan Note (Signed)
will update today. Takes Vitamin D supplement daily.

## 2017-09-13 ENCOUNTER — Telehealth: Payer: Self-pay | Admitting: Family Medicine

## 2017-09-13 NOTE — Telephone Encounter (Signed)
Spoke with patient regarding labs and physician's note (charted in results note) Patient agreed to start statin-phoned in as ordered in results note. Per Dr. Ansel Bong visit note, patient can have lipid repeated at 6 month or 1 year. Scheduled lab for 03/17/18. Please enter lab order.  Thank you.

## 2017-09-13 NOTE — Telephone Encounter (Signed)
Patient calling back for lab results

## 2017-09-13 NOTE — Telephone Encounter (Signed)
Copied from [place] [phone]. Topic: Quick Communication - Lab Results >> Sep 12, 2017  1:11 PM [place], [place], [place] wrote: Called patient to inform them of 09/11/2017 lab results. When patient returns call, triage nurse may disclose results. >> Sep 13, 2017 10:33 AM Yvette Rack wrote: Pt calling about labs results advised pt that someone would give him a call back

## 2017-09-13 NOTE — Telephone Encounter (Signed)
Attempted to call pt. To give lab results; left voice message to call back to office.

## 2017-09-13 NOTE — Telephone Encounter (Signed)
Copied from [place] [id number]. Topic: Quick Communication - Lab Results >> Sep 12, 2017  1:11 PM [place], [place], [place] wrote: Called patient to inform them of 09/11/2017 lab results. When patient returns call, triage nurse may disclose results. >> Sep 13, 2017 10:33 AM Yvette Rack wrote: Pt calling about labs results advised pt that someone would give him a call back

## 2017-09-16 ENCOUNTER — Encounter: Payer: Self-pay | Admitting: Internal Medicine

## 2017-09-16 ENCOUNTER — Ambulatory Visit: Payer: Medicare Other | Admitting: Internal Medicine

## 2017-09-16 ENCOUNTER — Telehealth: Payer: Self-pay | Admitting: Internal Medicine

## 2017-09-16 VITALS — BP 140/96 | HR 80 | Ht 69.5 in | Wt 248.5 lb

## 2017-09-16 DIAGNOSIS — F439 Reaction to severe stress, unspecified: Secondary | ICD-10-CM

## 2017-09-16 DIAGNOSIS — K5150 Left sided colitis without complications: Secondary | ICD-10-CM

## 2017-09-16 MED ORDER — PREDNISONE 10 MG PO TABS: ORAL_TABLET | ORAL | 0 refills | Status: DC

## 2017-09-16 NOTE — Progress Notes (Signed)
Angel Frazier 62 y.o. 06-05-1955 [id number]  Assessment & Plan:   Encounter Diagnoses  Name Primary?  . Left sided ulcerative (chronic) colitis ([place]) Yes  . Situational stress     He may be having a flare of his colitis though the stress of his longtime girlfriend stroke and some dietary indiscretion may be having a role as well.  I did prescribe prednisone and asked him to take it if he has persistent symptoms and let me know.  He would start at 40 mg daily he did and I would advise a taper if he needs to start it. He has been and was advised of steroid side effects including bone fractures.  Ask about biologic treatments in the things he sees commercials for we reviewed that some.  It could be a cost prohibitive issue though he might need those in the future, depending upon how he does.  He is intolerant of 6-MP and thus azathioprine.  I will see him in 1 year routinely and sooner if needed  If he does have clear recurrent flare would repeat a colonoscopy prior to changing long-term Tx most likely. Had patchy mild left UC 2015.  I appreciate the opportunity to care for this patient. As as aboveCC: Angel Olp, MD    Subjective:   Chief Complaint: Diarrhea  HPI  Angel Frazier presents with a several day history of loose bowel movements, soft more frequent than usual.  Normally goes 2-3 times a day but since 3-4 days ago he has been going 5-6 times a day though is only been 3 times no abdominal pain or bleeding or fever and no new medications although he has been started on atorvastatin 20 mg weekly he is yet to start that.. No recent antibiotics.  He called because he did not want to get as severe as he has had in the past with ulcerative colitis flares.  No bleeding reported.  He says that lately he has been eating some fried food ([employer] and Popeyes), he has been under some stress because his long-term girlfriend had a stroke.  He wonders if that might be contributing. He is  intentionally dieting.  Working on his diabetes which is better.  His last colonoscopy was negative in 201 in the past year so he has had some flares5.  I had anticipated a repeat in 2020.  Last flare was in August 2018 treated with a short course of prednisone he has had 2 or 3 treatments of prednisone in the last 2 years. Wt Readings from Last 3 Encounters:  09/16/17 248 lb 8 oz (112.7 kg)  09/11/17 247 lb 9.6 oz (112.3 kg)  06/18/17 256 lb 14.4 oz (116.5 kg)     Allergies  Allergen Reactions  . Mercaptopurine Other (See Comments)    Caused Pancreatitis   Current Meds  Medication Sig  . ACCU-CHEK AVIVA PLUS test strip TEST FOUR TIMES A DAY AS DIRECTED BY PRESCRIBER  . ACCU-CHEK SOFTCLIX LANCETS lancets TEST FOUR TIMES A DAY AS DIRECTED BY PRESCRIBER  . Ascorbic Acid (VITAMIN C) 1000 MG tablet Take 1,000 mg by mouth daily. Reported on 09/28/2015  . aspirin 81 MG tablet Take 81 mg by mouth daily. Reported on 09/28/2015  . atorvastatin (LIPITOR) 20 MG tablet Take 20 mg by mouth once a week.  . blood glucose meter kit and supplies Dispense based on patient and insurance preference. Use up to four times daily as directed. (FOR ICD-9 250.00, 250.01).  . Calcium Carbonate-Vitamin  D (CALCIUM-VITAMIN D3 PO) Take 1 tablet by mouth daily. Reported on 09/28/2015  . cetirizine HCl (ZYRTEC) 5 MG/5ML SOLN Take 10 mLs (10 mg total) by mouth daily.  . Cholecalciferol (VITAMIN D) 2000 units CAPS Take 1 capsule by mouth daily.  . cyclobenzaprine (FLEXERIL) 10 MG tablet Take 10 mg by mouth 3 (three) times daily as needed for muscle spasms. Reported on 09/26/2015  . insulin aspart (NOVOLOG) 100 UNIT/ML injection Inject 8 Units into the skin 3 (three) times daily before meals.  . insulin glargine (LANTUS) 100 UNIT/ML injection Inject 30 Units into the skin at bedtime.  . mesalamine (LIALDA) 1.2 g EC tablet Take 2 tablets (2.4 g total) by mouth 2 (two) times daily.  Marland Kitchen METHOCARBAMOL PO Take 500 mg by mouth daily  as needed (for muscle spasms).   . Multiple Vitamins-Minerals (CENTRUM SILVER PO) Take 1 tablet by mouth daily. Reported on 09/28/2015  . Omega-3 Fatty Acids (FISH OIL) 300 MG CAPS Take 300 mg by mouth daily.   . pioglitazone-metformin (ACTOPLUS MET) 15-850 MG tablet Take 1 tablet by mouth daily.  . vitamin B-12 (CYANOCOBALAMIN) 1000 MCG tablet Take 2,500 mcg by mouth daily. Reported on 09/28/2015   Past Medical History:  Diagnosis Date  .  vitamin D deficiency 01/24/2013   01/23/13 - level 29 - supplements 50k IU weekly x 6 recommended   . Anabolic steroid abuse   . Avascular necrosis of femoral head (HCC)    bilateral  . Bleeding hemorrhoids   . Colitis 08/06/2013   Mildly actibr colitis  . Diabetes mellitus without complication ([place]) 0947   type 2  . Drug-induced acute pancreatitis - 6MP 09/07/2015  . Erectile dysfunction   . Hypertension   . Left sided ulcerative colitis ([place])   . Spinal stenosis of lumbar region at multiple levels    Past Surgical History:  Procedure Laterality Date  . COLONOSCOPY     multiple  . KNEE ARTHROSCOPY Right   . SIGMOIDOSCOPY     Social History   Social History Narrative   Single never married. 1 son 66 in 2018. 1 grandson 52 in 2018. 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   Review of Systems As above  Objective:   Physical Exam _0  (!) 140/96 (BP Location: Left Arm, Patient Position: Sitting, Cuff Size: Normal)   Pulse 80   Ht 5' 9.5" (1.765 m)   Wt 248 lb 8 oz (112.7 kg)   BMI 36.17 kg/m @  General:  NAD Eyes:   anicteric Lungs:  clear Heart::  S1S2 no rubs, murmurs or gallops Abdomen:  soft and nontender, BS+ Ext:   no edema, cyanosis or clubbing    Data Reviewed:    Prior GI notes, labs in EMR

## 2017-09-16 NOTE — Telephone Encounter (Signed)
Pt having a UC flare up.

## 2017-09-16 NOTE — Patient Instructions (Addendum)
  We have sent the following medications to your pharmacy for you to pick up at your convenience: Prednisone Call and let us know if you start this.    Follow up with Dr Carlean Purl in a year or sooner if needed.    I appreciate the opportunity to care for you. Silvano Rusk, MD, [facility]

## 2017-09-16 NOTE — Telephone Encounter (Signed)
Patient reports that he is having a UC flare.  " diarrhea".  Denies bleeding.  He will come in and see Dr. Carlean Purl this pm at 3:00

## 2017-09-16 NOTE — Telephone Encounter (Signed)
Left message for patient to call back  

## 2017-09-23 ENCOUNTER — Telehealth: Payer: Self-pay

## 2017-09-24 NOTE — Telephone Encounter (Signed)
Patient started prednisone

## 2017-09-25 ENCOUNTER — Other Ambulatory Visit: Payer: Self-pay | Admitting: Family Medicine

## 2017-09-25 NOTE — Telephone Encounter (Signed)
Have him update me on Monday re: sxs and will advise taper after

## 2017-09-26 NOTE — Telephone Encounter (Signed)
Left a message for Angel Frazier to call on Monday with an update

## 2017-09-30 ENCOUNTER — Telehealth: Payer: Self-pay | Admitting: Internal Medicine

## 2017-09-30 NOTE — Telephone Encounter (Signed)
30 mg daily x 1 week 20 mg daily x 1 week 10 mg daily x 2 weeks 5 mg daily x 2 weeks Stop after that   Have him see me in July Call back sooner prn

## 2017-09-30 NOTE — Telephone Encounter (Signed)
I left detailed instructions for the patient.

## 2017-09-30 NOTE — Telephone Encounter (Signed)
Dr. Carlean Purl.  Please see message from [place].  Please advise on prednisone taper

## 2017-10-07 ENCOUNTER — Telehealth: Payer: Self-pay

## 2017-10-07 NOTE — Telephone Encounter (Signed)
We received forms faxed over from [facility] for Angel Frazier to get assistance with his Lialda. I will mail these to him per his request and he can do his part and get them back to me to have Dr Carlean Purl sign and then I can fax them. I confirmed his address.

## 2017-11-04 ENCOUNTER — Other Ambulatory Visit: Payer: Self-pay

## 2017-11-04 ENCOUNTER — Encounter (HOSPITAL_COMMUNITY): Payer: Self-pay | Admitting: Emergency Medicine

## 2017-11-04 ENCOUNTER — Ambulatory Visit (HOSPITAL_COMMUNITY)
Admission: EM | Admit: 2017-11-04 | Discharge: 2017-11-04 | Disposition: A | Discharge status: Home / Self Care | Payer: Medicare Other | Attending: Internal Medicine | Admitting: Internal Medicine

## 2017-11-04 ENCOUNTER — Telehealth: Payer: Self-pay

## 2017-11-04 DIAGNOSIS — M62838 Other muscle spasm: Secondary | ICD-10-CM

## 2017-11-04 MED ORDER — CYCLOBENZAPRINE HCL 5 MG PO TABS: 5.0000 mg | ORAL_TABLET | Freq: Every day | ORAL | 0 refills | Status: DC

## 2017-11-04 MED ORDER — NAPROXEN 375 MG PO TABS: 375.0000 mg | ORAL_TABLET | Freq: Two times a day (BID) | ORAL | 0 refills | Status: DC | PRN

## 2017-11-04 NOTE — Telephone Encounter (Signed)
  Form faxed to [facility] patient assistance program, fax # [phone]. This is for patient's Lialda 1.2 gram tablets. Sig: 2 tabs PO BID. Form signed by Dr Carlean Purl and faxed on 11/01/17.

## 2017-11-04 NOTE — ED Provider Notes (Signed)
[facility]   [id number] 11/04/17 Arrival Time: 4098  SUBJECTIVE: History from: patient. Angel Frazier is a 62 y.o. male complains of gradual worsening right upper back pain that began 1 week ago.  Denies a precipitating event or specific injury.  Back and radiates towards elbow.  Describes the pain as constant and "aggravating" in character.  Has tried heating pad without relief.  Symptoms are made worse with rest.  Denies similar symptoms in the past.  Denies fever, chills, erythema, ecchymosis, effusion, weakness, numbness and tingling.      ROS: As per HPI.  Past Medical History:  Diagnosis Date  .  vitamin D deficiency 01/24/2013   01/23/13 - level 29 - supplements 50k IU weekly x 6 recommended   . Anabolic steroid abuse   . Avascular necrosis of femoral head (HCC)    bilateral  . Bleeding hemorrhoids   . Colitis 08/06/2013   Mildly actibr colitis  . Diabetes mellitus without complication ([place]) 1191   type 2  . Drug-induced acute pancreatitis - 6MP 09/07/2015  . Erectile dysfunction   . Hypertension   . Left sided ulcerative colitis ([place])   . Spinal stenosis of lumbar region at multiple levels    Past Surgical History:  Procedure Laterality Date  . COLONOSCOPY     multiple  . KNEE ARTHROSCOPY Right   . SIGMOIDOSCOPY     Allergies  Allergen Reactions  . Mercaptopurine Other (See Comments)    Caused Pancreatitis   No current facility-administered medications on file prior to encounter.    Current Outpatient Medications on File Prior to Encounter  Medication Sig Dispense Refill  . ACCU-CHEK AVIVA PLUS test strip TEST FOUR TIMES A DAY AS DIRECTED BY PRESCRIBER 350 each 0  . ACCU-CHEK SOFTCLIX LANCETS lancets TEST FOUR TIMES A DAY AS DIRECTED BY PRESCRIBER 300 each 0  . Ascorbic Acid (VITAMIN C) 1000 MG tablet Take 1,000 mg by mouth daily. Reported on 09/28/2015    . aspirin 81 MG tablet Take 81 mg by mouth daily. Reported on 09/28/2015    . atorvastatin (LIPITOR) 20  MG tablet Take 20 mg by mouth once a week.    . blood glucose meter kit and supplies Dispense based on patient and insurance preference. Use up to four times daily as directed. (FOR ICD-9 250.00, 250.01). 1 each 0  . Calcium Carbonate-Vitamin D (CALCIUM-VITAMIN D3 PO) Take 1 tablet by mouth daily. Reported on 09/28/2015    . cetirizine HCl (ZYRTEC) 5 MG/5ML SOLN Take 10 mLs (10 mg total) by mouth daily. 300 mL 0  . Cholecalciferol (VITAMIN D) 2000 units CAPS Take 1 capsule by mouth daily.    . insulin aspart (NOVOLOG) 100 UNIT/ML injection Inject 8 Units into the skin 3 (three) times daily before meals.    . insulin glargine (LANTUS) 100 UNIT/ML injection Inject 30 Units into the skin at bedtime.    . mesalamine (LIALDA) 1.2 g EC tablet Take 2 tablets (2.4 g total) by mouth 2 (two) times daily. 360 tablet 3  . METHOCARBAMOL PO Take 500 mg by mouth daily as needed (for muscle spasms).     . Multiple Vitamins-Minerals (CENTRUM SILVER PO) Take 1 tablet by mouth daily. Reported on 09/28/2015    . Omega-3 Fatty Acids (FISH OIL) 300 MG CAPS Take 300 mg by mouth daily.     . pioglitazone-metformin (ACTOPLUS MET) 15-850 MG tablet Take 1 tablet by mouth daily. 90 tablet 3  . predniSONE (DELTASONE) 10 MG  tablet Use as directed - start at 40 mg daily and notify Dr. Carlean Purl if you need it 100 tablet 0  . vitamin B-12 (CYANOCOBALAMIN) 1000 MCG tablet Take 2,500 mcg by mouth daily. Reported on 09/28/2015     Social History   Socioeconomic History  . Marital status: Single    Spouse name: Not on file  . Number of children: 1  . Years of education: Not on file  . Highest education level: Not on file  Occupational History  . Occupation: retired    Fish farm manager: [employer] [place]  . Financial resource strain: Not on file  . Food insecurity:    Worry: Not on file    Inability: Not on file  . Transportation needs:    Medical: Not on file    Non-medical: Not on file  Tobacco Use  . Smoking  status: Former Smoker    Packs/day: 1.00    Years: 6.00    Pack years: 6.00    Types: Cigarettes    Last attempt to quit: 06/04/2000    Years since quitting: 17.4  . Smokeless tobacco: Never Used  Substance and Sexual Activity  . Alcohol use: Yes    Comment: occasionally  . Drug use: No  . Sexual activity: Not on file  Lifestyle  . Physical activity:    Days per week: Not on file    Minutes per session: Not on file  . Stress: Not on file  Relationships  . Social connections:    Talks on phone: Not on file    Gets together: Not on file    Attends religious service: Not on file    Active member of club or organization: Not on file    Attends meetings of clubs or organizations: Not on file    Relationship status: Not on file  . Intimate partner violence:    Fear of current or ex partner: Not on file    Emotionally abused: Not on file    Physically abused: Not on file    Forced sexual activity: Not on file  Other Topics Concern  . Not on file  Social History Narrative   Single never married. 1 son 103 in 2018. 1 grandson 60 in 2018. 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 Diabetes Mother   . Diabetes Unknown        cousin  . Hypertension Unknown   . Diabetes type I Son     OBJECTIVE:  Vitals:   11/04/17 1705  BP: (!) 157/110  Pulse: 77  Resp: 20  Temp: 98.2 F (36.8 C)  TempSrc: Oral  SpO2: 98%    General appearance: AOx3; in no acute distress.  Head: NCAT Lungs: CTA bilaterally Heart: RRR.  Clear S1 and S2 without murmur, gallops, or rubs.  Radial pulses 2+ bilaterally. Musculoskeletal: Back Inspection: Skin warm, dry, clear and intact without obvious erythema, effusion, or ecchymosis.  Palpation: Nontender to palpation; palpable muscle spasm about the right paravertebral muscle ROM:  FROM active and passive Strength: 5/5 shld abduction, 5/5 shld adduction, 5/5 elbow flexion, 5/5  elbow extension, 5/5 grip strength Skin: warm and dry Neurologic: Ambulates without difficulty; Sensation intact about the upper/ lower extremities Psychological: alert and cooperative;  normal mood and affect  ASSESSMENT & PLAN:  1. Muscle spasm     Meds ordered this encounter  Medications  . naproxen (NAPROSYN) 375 MG tablet    Sig: Take 1 tablet (375 mg total) by mouth 2 (two) times daily as needed for moderate pain.    Dispense:  20 tablet    Refill:  0    Order Specific Question:   Supervising Provider    Answer:   Wynona Luna [phone]  . cyclobenzaprine (FLEXERIL) 5 MG tablet    Sig: Take 1 tablet (5 mg total) by mouth at bedtime.    Dispense:  12 tablet    Refill:  0    Order Specific Question:   Supervising Provider    Answer:   Wynona Luna [[id number]]    Continue conservative management of rest, ice, heat, and gentle stretches Take naproxen as needed for pain relief (may cause abdominal discomfort, ulcers, and GI bleeds avoid taking with other NSAIDs) Take cyclobenzaprine at nighttime for symptomatic relief. Avoid driving or operating heavy machinery while using medication. Follow up with PCP if symptoms persist Present to ER if worsening or new symptoms (fever, chills, chest pain, abdominal pain, changes in bowel or bladder habits, pain radiating into lower legs, etc...)   Reviewed expectations re: course of current medical issues. Questions answered. Outlined signs and symptoms indicating need for more acute intervention. Patient verbalized understanding. After Visit Summary given.    Lestine Box, PA-C 11/04/17 1747

## 2017-11-04 NOTE — Discharge Instructions (Signed)
Continue conservative management of rest, ice, heat, and gentle stretches Take naproxen as needed for pain relief (may cause abdominal discomfort, ulcers, and GI bleeds avoid taking with other NSAIDs) Take cyclobenzaprine at nighttime for symptomatic relief. Avoid driving or operating heavy machinery while using medication. Follow up with PCP if symptoms persist Present to ER if worsening or new symptoms (fever, chills, chest pain, abdominal pain, changes in bowel or bladder habits, pain radiating into lower legs, etc...)

## 2017-11-04 NOTE — ED Triage Notes (Signed)
Right upper back pain and pain radiating into right elbow for a week.  Denies injury

## 2017-11-13 DIAGNOSIS — M5412 Radiculopathy, cervical region: Secondary | ICD-10-CM | POA: Diagnosis not present

## 2017-11-13 DIAGNOSIS — M546 Pain in thoracic spine: Secondary | ICD-10-CM | POA: Diagnosis not present

## 2017-12-04 NOTE — Telephone Encounter (Signed)
I spoke with Judson Roch at [facility], phone # [phone] and she started processing his patient assistance form for his Lialda. She said it is missing proof of his financial information. I called and left him a detailed message that we need a 1099 form from 2018 or a social security letter from 2019 if that is how he gets his income. This was left on his mobile #. I told him I will be out of the office until 12/10/17. Once I get this information I will fax it to them to complete his form.

## 2017-12-11 ENCOUNTER — Encounter: Payer: Self-pay | Admitting: Internal Medicine

## 2017-12-11 ENCOUNTER — Ambulatory Visit: Payer: Medicare Other | Admitting: Internal Medicine

## 2017-12-11 VITALS — BP 132/80 | HR 84 | Ht 69.0 in | Wt 240.4 lb

## 2017-12-11 DIAGNOSIS — K5150 Left sided colitis without complications: Secondary | ICD-10-CM | POA: Diagnosis not present

## 2017-12-11 DIAGNOSIS — Z7952 Long term (current) use of systemic steroids: Secondary | ICD-10-CM

## 2017-12-11 MED ORDER — MESALAMINE 1.2 G PO TBEC: 2.4000 g | DELAYED_RELEASE_TABLET | Freq: Two times a day (BID) | ORAL | 11 refills | Status: DC

## 2017-12-11 NOTE — Telephone Encounter (Signed)
Patient came in to see Dr Carlean Purl today and brought his financial information. I faxed it to [facility] cares and quickly we received a fax approving him for assistance with his Lialda through 12/15/2018. They have notified [place] of this information.

## 2017-12-11 NOTE — Patient Instructions (Signed)
We have sent the following medications to your pharmacy for you to pick up at your convenience: Lialda   Please follow up with Dr Carlean Purl in a year or sooner if needed.    I appreciate the opportunity to care for you. Silvano Rusk, MD, [facility]

## 2017-12-11 NOTE — Progress Notes (Signed)
Angel Frazier 62 y.o. Dec 02, 1955 [id number]  Assessment & Plan:  LEFT SIDED ULCERATIVE COLITIS Doing well currently on mesalamine We discussed pros/cons of moving to biologic tx and that would be cost-prohibitive it seems He is well now and not inclined to make changes He has required some short steroid tapers off and on and is aware of the risks of steroids including fracture, htn, cataracts, weight gain, etc Stay on current Tx and RTYC 1 yr sooner prn Review after visit shows no recording of any pneumonia vaccines - w/ UC and DM and age seems appropriate if not done Will contact him and order Prevnar-13 to start and it appears he should wait 1 year for Pneumovax  Probably cheaper to do at a pharmacy so will try to set that up for him  Long term current use of systemic steroids As per UC ass/plan Off/on and internittent He understands and accepts risks Already has known AVN but not surgical it seems - also has hx anabolic steroids I think   I appreciate the opportunity to care for this patient. OP:FYTWKM, Brayton Mars, MD  Subjective:   Chief Complaint: follow-up UC  HPI No bleeding, diarrhea, abd pain since April - took short course prednisone Feels good now - avoiding spicy foods and thinks that helps Busy helping girlfriend after her stroke "not much me time" She is better w/ lass aphasia or dysarthria but not back to NL - her kids do not seem to help much  Allergies  Allergen Reactions  . Mercaptopurine Other (See Comments)    Caused Pancreatitis   Current Meds  Medication Sig  . ACCU-CHEK AVIVA PLUS test strip TEST FOUR TIMES A DAY AS DIRECTED BY PRESCRIBER  . ACCU-CHEK SOFTCLIX LANCETS lancets TEST FOUR TIMES A DAY AS DIRECTED BY PRESCRIBER  . Ascorbic Acid (VITAMIN C) 1000 MG tablet Take 1,000 mg by mouth daily. Reported on 09/28/2015  . aspirin 81 MG tablet Take 81 mg by mouth daily. Reported on 09/28/2015  . atorvastatin (LIPITOR) 20 MG tablet Take 20 mg by  mouth once a week.  . blood glucose meter kit and supplies Dispense based on patient and insurance preference. Use up to four times daily as directed. (FOR ICD-9 250.00, 250.01).  . Calcium Carbonate-Vitamin D (CALCIUM-VITAMIN D3 PO) Take 1 tablet by mouth daily. Reported on 09/28/2015  . cetirizine HCl (ZYRTEC) 5 MG/5ML SOLN Take 10 mLs (10 mg total) by mouth daily.  . Cholecalciferol (VITAMIN D) 2000 units CAPS Take 1 capsule by mouth daily.  Marland Kitchen gabapentin (NEURONTIN) 300 MG capsule Take 300 mg by mouth 3 (three) times daily.   . insulin aspart (NOVOLOG) 100 UNIT/ML injection Inject 8 Units into the skin 3 (three) times daily before meals.  . insulin glargine (LANTUS) 100 UNIT/ML injection Inject 30 Units into the skin at bedtime.  . mesalamine (LIALDA) 1.2 g EC tablet Take 2 tablets (2.4 g total) by mouth 2 (two) times daily.  Marland Kitchen METHOCARBAMOL PO Take 500 mg by mouth daily as needed (for muscle spasms).   . Multiple Vitamins-Minerals (CENTRUM SILVER PO) Take 1 tablet by mouth daily. Reported on 09/28/2015  . Omega-3 Fatty Acids (FISH OIL) 300 MG CAPS Take 300 mg by mouth daily.   . pioglitazone-metformin (ACTOPLUS MET) 15-850 MG tablet Take 1 tablet by mouth daily.  . vitamin B-12 (CYANOCOBALAMIN) 1000 MCG tablet Take 2,500 mcg by mouth daily. Reported on 09/28/2015  . [DISCONTINUED] mesalamine (LIALDA) 1.2 g EC tablet Take 2 tablets (2.4  g total) by mouth 2 (two) times daily.   Past Medical History:  Diagnosis Date  .  vitamin D deficiency 01/24/2013   01/23/13 - level 29 - supplements 50k IU weekly x 6 recommended   . Anabolic steroid abuse   . Avascular necrosis of femoral head (HCC)    bilateral  . Bleeding hemorrhoids   . Colitis 08/06/2013   Mildly actibr colitis  . Diabetes mellitus without complication ([place]) 9030   type 2  . Drug-induced acute pancreatitis - 6MP 09/07/2015  . Erectile dysfunction   . Hypertension   . Left sided ulcerative colitis ([place])   . Spinal stenosis of lumbar  region at multiple levels    Past Surgical History:  Procedure Laterality Date  . COLONOSCOPY     multiple  . KNEE ARTHROSCOPY Right   . SIGMOIDOSCOPY     Social History   Social History Narrative   Single never married. 1 son 10 in 2018. 1 grandson 40 in 2018. 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   Review of Systems As above  Objective:   Physical Exam BP 132/80   Pulse 84   Ht 5' 9"  (1.753 m)   Wt 240 lb 7 oz (109.1 kg)   BMI 35.51 kg/m  @BP  132/80   Pulse 84   Ht 5' 9"  (1.753 m)   Wt 240 lb 7 oz (109.1 kg)   BMI 35.51 kg/m @  General:  NAD Eyes:   anicteric Lungs:  clear Heart::  S1S2 no rubs, murmurs or gallops Abdomen:  soft and nontender, BS+ Ext:   no edema, cyanosis or clubbing    Data Reviewed:   Prior GI, PCP notes labs in EMR from 2018-19

## 2017-12-16 ENCOUNTER — Telehealth: Payer: Self-pay

## 2017-12-16 ENCOUNTER — Encounter: Payer: Self-pay | Admitting: Endocrinology

## 2017-12-16 ENCOUNTER — Ambulatory Visit: Payer: Medicare Other | Admitting: Endocrinology

## 2017-12-16 VITALS — BP 138/86 | HR 75 | Wt 241.2 lb

## 2017-12-16 DIAGNOSIS — E119 Type 2 diabetes mellitus without complications: Secondary | ICD-10-CM

## 2017-12-16 LAB — POCT GLYCOSYLATED HEMOGLOBIN (HGB A1C): Hemoglobin A1C: 5.8 % — AB (ref 4.0–5.6)

## 2017-12-16 NOTE — Assessment & Plan Note (Addendum)
Doing well currently on mesalamine We discussed pros/cons of moving to biologic tx and that would be cost-prohibitive it seems He is well now and not inclined to make changes He has required some short steroid tapers off and on and is aware of the risks of steroids including fracture, htn, cataracts, weight gain, etc Stay on current Tx and RTYC 1 yr sooner prn Review after visit shows no recording of any pneumonia vaccines - w/ UC and DM and age seems appropriate if not done Will contact him and order Prevnar-13 to start and it appears he should wait 1 year for Pneumovax  Probably cheaper to do at a pharmacy so will try to set that up for him

## 2017-12-16 NOTE — Progress Notes (Signed)
Subjective:    Patient ID: Angel Frazier, male    DOB: 14-Aug-1955, 62 y.o.   MRN: [id number]  HPI Pt returns for f/u of diabetes mellitus: DM type: 1, in remission (AKA atypical DM, ketosis-prone DM, etc).   Dx'ed: 2500 Complications: none Therapy: 2 oral meds.  DKA: once, at dx Severe hypoglycemia: never Pancreatitis: never Pancreatic imaging: normal on 2017 CT Other: he has been in remission since soon after dx. He took insulin for a brief time after dx.   Interval history:  He says cbg's are still well-controlled, off insulin.  pt states he feels well in general.   Past Medical History:  Diagnosis Date  .  vitamin D deficiency 01/24/2013   01/23/13 - level 29 - supplements 50k IU weekly x 6 recommended   . Anabolic steroid abuse   . Avascular necrosis of femoral head (HCC)    bilateral  . Bleeding hemorrhoids   . Colitis 08/06/2013   Mildly actibr colitis  . Diabetes mellitus without complication ([place]) 3704   type 2  . Drug-induced acute pancreatitis - 6MP 09/07/2015  . Erectile dysfunction   . Hypertension   . Left sided ulcerative colitis ([place])   . Spinal stenosis of lumbar region at multiple levels     Past Surgical History:  Procedure Laterality Date  . COLONOSCOPY     multiple  . KNEE ARTHROSCOPY Right   . SIGMOIDOSCOPY      Social History   Socioeconomic History  . Marital status: Single    Spouse name: Not on file  . Number of children: 1  . Years of education: Not on file  . Highest education level: Not on file  Occupational History  . Occupation: retired    Fish farm manager: [employer]  . Financial resource strain: Not on file  . Food insecurity:    Worry: Not on file    Inability: Not on file  . Transportation needs:    Medical: Not on file    Non-medical: Not on file  Tobacco Use  . Smoking status: Former Smoker    Packs/day: 1.00    Years: 6.00    Pack years: 6.00    Types: Cigarettes    Last attempt to quit: 06/04/2000      Years since quitting: 17.5  . Smokeless tobacco: Never Used  Substance and Sexual Activity  . Alcohol use: Yes    Comment: occasionally  . Drug use: No  . Sexual activity: Not on file  Lifestyle  . Physical activity:    Days per week: Not on file    Minutes per session: Not on file  . Stress: Not on file  Relationships  . Social connections:    Talks on phone: Not on file    Gets together: Not on file    Attends religious service: Not on file    Active member of club or organization: Not on file    Attends meetings of clubs or organizations: Not on file    Relationship status: Not on file  . Intimate partner violence:    Fear of current or ex partner: Not on file    Emotionally abused: Not on file    Physically abused: Not on file    Forced sexual activity: Not on file  Other Topics Concern  . Not on file  Social History Narrative   Single never married. 1 son 74 in 2018. 1 grandson 36 in 2018. 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 ACCU-CHEK AVIVA PLUS test strip TEST FOUR TIMES A DAY AS DIRECTED BY PRESCRIBER 350 each 0  . ACCU-CHEK SOFTCLIX LANCETS lancets TEST FOUR TIMES A DAY AS DIRECTED BY PRESCRIBER 300 each 0  . Ascorbic Acid (VITAMIN C) 1000 MG tablet Take 1,000 mg by mouth daily. Reported on 09/28/2015    . aspirin 81 MG tablet Take 81 mg by mouth daily. Reported on 09/28/2015    . atorvastatin (LIPITOR) 20 MG tablet Take 20 mg by mouth once a week.    . blood glucose meter kit and supplies Dispense based on patient and insurance preference. Use up to four times daily as directed. (FOR ICD-9 250.00, 250.01). 1 each 0  . Calcium Carbonate-Vitamin D (CALCIUM-VITAMIN D3 PO) Take 1 tablet by mouth daily. Reported on 09/28/2015    . cetirizine HCl (ZYRTEC)  5 MG/5ML SOLN Take 10 mLs (10 mg total) by mouth daily. 300 mL 0  . Cholecalciferol (VITAMIN D) 2000 units CAPS Take 1 capsule by mouth daily.    Marland Kitchen gabapentin (NEURONTIN) 300 MG capsule Take 300 mg by mouth 3 (three) times daily.   0  . mesalamine (LIALDA) 1.2 g EC tablet Take 2 tablets (2.4 g total) by mouth 2 (two) times daily. 120 tablet 11  . METHOCARBAMOL PO Take 500 mg by mouth daily as needed (for muscle spasms).     . Multiple Vitamins-Minerals (CENTRUM SILVER PO) Take 1 tablet by mouth daily. Reported on 09/28/2015    . Omega-3 Fatty Acids (FISH OIL) 300 MG CAPS Take 300 mg by mouth daily.     . pioglitazone-metformin (ACTOPLUS MET) 15-850 MG tablet Take 1 tablet by mouth daily. 90 tablet 3  . vitamin B-12 (CYANOCOBALAMIN) 1000 MCG tablet Take 2,500 mcg by mouth daily. Reported on 09/28/2015     No current facility-administered medications on file prior to visit.     Allergies  Allergen Reactions  . Mercaptopurine Other (See Comments)    Caused Pancreatitis    Family History  Problem Relation Age of Onset  . Diabetes Mother   . Diabetes Unknown        cousin  . Hypertension Unknown   . Diabetes type I Son     BP 138/86 (BP Location: Left Arm, Patient Position: Sitting, Cuff Size: Normal)   Pulse 75   Wt 241 lb 3.2 oz (109.4 kg)   SpO2 92%   BMI 35.62 kg/m    Review of Systems He denies hypoglycemia    Objective:   Physical Exam VITAL SIGNS:  See vs page GENERAL: no distress Pulses: foot pulses are intact bilaterally.   MSK: no deformity of the feet or ankles.  CV: no edema of the legs or ankles Skin:  no ulcer on the feet or ankles.  normal color and temp on the feet and ankles Neuro: sensation is intact to touch on the feet and ankles.    A1c=5.8%      Assessment & Plan:  Type 1 DM: well-controlled Obesity: unchanged. rx of this will help.   Patient Instructions  check your blood sugar once a day.  vary the time of day when you check, between before  the 3 meals, and at  bedtime.  also check if you have symptoms of your blood sugar being too high or too low.  please keep a record of the readings and bring it to your next appointment here (or you can bring the meter itself).  You can write it on any piece of paper.  please call us sooner if your blood sugar goes below 70, or if you have a lot of readings over 200.  You can stay off the insulin for now.  However, with time, the need for insulin will return.  The best way to delay that is to lose weight.  Please continue the same medication.  Please come back for a follow-up appointment in 6 months.

## 2017-12-16 NOTE — Telephone Encounter (Signed)
-----   Message from Gatha Mayer, MD sent at 12/16/2017  7:46 AM EDT ----- Regarding: Angel Frazier Immunization records indicate no Prevnar or Pneumococcal vaccines  Please clarify this  If none at all Rx Prevnar-13 - I suspect cheaper at his pharmacy so can send it there for him to do  Please record also

## 2017-12-16 NOTE — Patient Instructions (Signed)
check your blood sugar once a day.  vary the time of day when you check, between before the 3 meals, and at bedtime.  also check if you have symptoms of your blood sugar being too high or too low.  please keep a record of the readings and bring it to your next appointment here (or you can bring the meter itself).  You can write it on any piece of paper.  please call us sooner if your blood sugar goes below 70, or if you have a lot of readings over 200.  You can stay off the insulin for now.  However, with time, the need for insulin will return.  The best way to delay that is to lose weight.  Please continue the same medication.  Please come back for a follow-up appointment in 6 months.

## 2017-12-16 NOTE — Telephone Encounter (Signed)
I spoke with Angel Frazier and he doesn't think he's had either shot. He requested I mail him the information to read over and he will call me back if he wants to proceed with getting the prevnar 13. He said he had some shot years ago to prevent colds and it made him sick for 2 weeks. He's unsure of the name of this.

## 2017-12-16 NOTE — Assessment & Plan Note (Addendum)
As per UC ass/plan Off/on and internittent He understands and accepts risks Already has known AVN but not surgical it seems - also has hx anabolic steroids I think

## 2017-12-24 ENCOUNTER — Other Ambulatory Visit: Payer: Self-pay | Admitting: Endocrinology

## 2017-12-25 ENCOUNTER — Other Ambulatory Visit: Payer: Self-pay | Admitting: Family Medicine

## 2017-12-27 ENCOUNTER — Telehealth: Payer: Self-pay | Admitting: Family Medicine

## 2017-12-27 ENCOUNTER — Other Ambulatory Visit: Payer: Self-pay

## 2017-12-27 MED ORDER — ACCU-CHEK SOFTCLIX LANCETS MISC: 0 refills | Status: DC

## 2017-12-27 MED ORDER — GLUCOSE BLOOD VI STRP
ORAL_STRIP | 0 refills | Status: DC
Start: 2017-12-27 — End: 2018-06-27

## 2017-12-27 NOTE — Telephone Encounter (Signed)
Prescriptions sent to pharmacy as requested

## 2017-12-27 NOTE — Telephone Encounter (Unsigned)
Copied from [place] [id number]. Topic: Quick Communication - Rx Refill/Question >> Dec 27, 2017  9:53 AM Judyann Munson wrote: Medication: ACCU-CHEK AVIVA PLUS test strip   ACCU-CHEK SOFTCLIX LANCETS lancets   Has the patient contacted their pharmacy?no   Preferred Pharmacy (with phone number or street name): [facility] [phone] - [place], [place] - [address] [phone] (Phone) [phone] (Fax)      Agent: Please be advised that RX refills may take up to 3 business days. We ask that you follow-up with your pharmacy.

## 2017-12-27 NOTE — Telephone Encounter (Signed)
See note

## 2018-02-10 ENCOUNTER — Ambulatory Visit (HOSPITAL_COMMUNITY)
Admission: EM | Admit: 2018-02-10 | Discharge: 2018-02-10 | Disposition: A | Discharge status: Home / Self Care | Payer: Medicare Other | Attending: Family Medicine | Admitting: Family Medicine

## 2018-02-10 ENCOUNTER — Encounter (HOSPITAL_COMMUNITY): Payer: Self-pay | Admitting: Emergency Medicine

## 2018-02-10 DIAGNOSIS — T63481A Toxic effect of venom of other arthropod, accidental (unintentional), initial encounter: Secondary | ICD-10-CM | POA: Diagnosis not present

## 2018-02-10 MED ORDER — PREDNISONE 20 MG PO TABS: 20.0000 mg | ORAL_TABLET | Freq: Two times a day (BID) | ORAL | 0 refills | Status: DC

## 2018-02-10 MED ORDER — METHYLPREDNISOLONE SODIUM SUCC 125 MG IJ SOLR
INTRAMUSCULAR | Status: AC
  Filled 2018-02-10: qty 2

## 2018-02-10 MED ORDER — METHYLPREDNISOLONE SODIUM SUCC 125 MG IJ SOLR
80.0000 mg | Freq: Once | INTRAMUSCULAR | Status: AC
  Administered 2018-02-10: 80 mg via INTRAMUSCULAR

## 2018-02-10 NOTE — Discharge Instructions (Signed)
Continue to use ice to the area. Take Benadryl every 6 hours for itching and for the allergic reaction. Take prednisone twice a day as an anti-inflammatory. Prednisone can temporarily raise your blood sugar.  This is usually a moderate increase that does not require change in medication or treatment. Return as needed.  Make sure that you come back promptly for any worsening swelling, trouble breathing, worsening comfort

## 2018-02-10 NOTE — ED Triage Notes (Signed)
Pt states he was cutting a bush down and a bee stung him in the face. Swelling to upper lip. Denies hx of anaphylaxis,denies SOB, resp e/u

## 2018-02-10 NOTE — ED Provider Notes (Signed)
[name]    CSN: [id number] Arrival date & time: 02/10/18  1157     History   Chief Complaint No chief complaint on file.   HPI Angel Frazier is a 63 y.o. male.   HPI  Patient is here for a wasp sting to his face This happened just prior to arrival He was helping someone with yard work and encountered a wasp He has a known allergy Only has had local swelling with no SOB or trouble swallowing He took a benadryl right away Otherwise has multiple medical problems, uncontrolled diabetes, well controlled blood pressure, has a primary care doctor and specialist that he is careful to attend and to comply    Past Medical History:  Diagnosis Date  .  vitamin D deficiency 01/24/2013   01/23/13 - level 29 - supplements 50k IU weekly x 6 recommended   . Anabolic steroid abuse   . Avascular necrosis of femoral head (HCC)    bilateral  . Bleeding hemorrhoids   . Colitis 08/06/2013   Mildly actibr colitis  . Diabetes mellitus without complication ([place]) 1443   type 2  . Drug-induced acute pancreatitis - 6MP 09/07/2015  . Erectile dysfunction   . Hypertension   . Left sided ulcerative colitis ([place])   . Spinal stenosis of lumbar region at multiple levels     Patient Active Problem List   Diagnosis Date Noted  . Pulmonary nodules 09/07/2015  . Controlled type 2 diabetes mellitus ([place]) 08/31/2015  . Long term current use of systemic steroids 11/18/2014  . Avascular necrosis of femoral heads 11/18/2014  . Hemorrhoids, internal, with bleeding 06/22/2014  . Vitamin D deficiency 01/24/2013  . ED (erectile dysfunction) 05/21/2012  . LEFT SIDED ULCERATIVE COLITIS 08/11/2008    Past Surgical History:  Procedure Laterality Date  . COLONOSCOPY     multiple  . KNEE ARTHROSCOPY Right   . SIGMOIDOSCOPY         Home Medications    Prior to Admission medications   Medication Sig Start Date End Date Taking? Authorizing Provider  ACCU-CHEK SOFTCLIX LANCETS lancets TEST  FOUR TIMES A DAY E11.9 12/27/17   [name], MD  Ascorbic Acid (VITAMIN C) 1000 MG tablet Take 1,000 mg by mouth daily. Reported on 09/28/2015    [provider]  aspirin 81 MG tablet Take 81 mg by mouth daily. Reported on 09/28/2015    [provider]  atorvastatin (LIPITOR) 20 MG tablet Take 20 mg by mouth once a week.    [provider]  blood glucose meter kit and supplies Dispense based on patient and insurance preference. Use up to four times daily as directed. (FOR ICD-9 250.00, 250.01). 04/09/17   [name], MD  Calcium Carbonate-Vitamin D (CALCIUM-VITAMIN D3 PO) Take 1 tablet by mouth daily. Reported on 09/28/2015    [provider]  cetirizine HCl (ZYRTEC) 5 MG/5ML SOLN Take 10 mLs (10 mg total) by mouth daily. 03/02/17   Tereasa Coop, PA-C  Cholecalciferol (VITAMIN D) 2000 units CAPS Take 1 capsule by mouth daily.    [provider]  gabapentin (NEURONTIN) 300 MG capsule Take 300 mg by mouth 3 (three) times daily.  11/13/17   [provider]  glucose blood (ACCU-CHEK AVIVA PLUS) test strip TEST FOUR TIMES DAILY AS DIRECTED BY PRESCRIBER 12/27/17   [name], MD  mesalamine (LIALDA) 1.2 g EC tablet Take 2 tablets (2.4 g total) by mouth 2 (two) times daily. 12/11/17  Gatha Mayer, MD  METHOCARBAMOL PO Take 500 mg by mouth daily as needed (for muscle spasms).     [provider]  Multiple Vitamins-Minerals (CENTRUM SILVER PO) Take 1 tablet by mouth daily. Reported on 09/28/2015    [provider]  Omega-3 Fatty Acids (FISH OIL) 300 MG CAPS Take 300 mg by mouth daily.     [provider]  pioglitazone-metformin (ACTOPLUS MET) 15-850 MG tablet TAKE 1 TABLET BY MOUTH ONCE DAILY 12/25/17   Renato Shin, MD  predniSONE (DELTASONE) 20 MG tablet Take 1 tablet (20 mg total) by mouth 2 (two) times daily with a meal. 02/10/18   Raylene Everts, MD  vitamin B-12 (CYANOCOBALAMIN) 1000 MCG tablet Take  2,500 mcg by mouth daily. Reported on 09/28/2015    [provider]    Family History Family History  Problem Relation Age of Onset  . Diabetes Mother   . Diabetes Unknown        cousin  . Hypertension Unknown   . Diabetes type I Son     Social History Social History   Tobacco Use  . Smoking status: Former Smoker    Packs/day: 1.00    Years: 6.00    Pack years: 6.00    Types: Cigarettes    Last attempt to quit: 06/04/2000    Years since quitting: 17.6  . Smokeless tobacco: Never Used  Substance Use Topics  . Alcohol use: Yes    Comment: occasionally  . Drug use: No     Allergies   Mercaptopurine   Review of Systems Review of Systems   Physical Exam Triage Vital Signs ED Triage Vitals  Enc Vitals Group     BP 02/10/18 1214 (!) 151/110     Pulse Rate 02/10/18 1213 94     Resp 02/10/18 1213 18     Temp 02/10/18 1213 98.3 F (36.8 C)     Temp src --      SpO2 02/10/18 1213 95 %   No data found.  Updated Vital Signs BP (!) 151/110   Pulse 94   Temp 98.3 F (36.8 C)   Resp 18   SpO2 95%      Physical Exam  Constitutional: He appears well-developed and well-nourished. No distress.  HENT:  Head: Normocephalic and atraumatic.    Right Ear: External ear normal.  Left Ear: External ear normal.  Mouth/Throat: Oropharynx is clear and moist.  Uvula normal, midline  Eyes: Pupils are equal, round, and reactive to light. Conjunctivae are normal.  Neck: Normal range of motion.  Cardiovascular: Normal rate, regular rhythm and normal heart sounds.  Pulmonary/Chest: Effort normal. No respiratory distress. He has no wheezes.  Lungs are clear.  No respiratory distress.  Abdominal: Soft. He exhibits no distension.  Musculoskeletal: Normal range of motion. He exhibits no edema.  Neurological: He is alert.  Skin: Skin is warm and dry.     UC Treatments / Results  Labs (all labs ordered are listed, but only abnormal results are displayed) Labs  Reviewed - No data to display  EKG None  Radiology No results found.  Procedures Procedures (including critical care time)  Medications Ordered in UC Medications  methylPREDNISolone sodium succinate (SOLU-MEDROL) 125 mg/2 mL injection 80 mg (has no administration in time range)    Initial Impression / Assessment and Plan / UC Course  I have reviewed the triage vital signs and the nursing notes.  Pertinent labs & imaging results that were available during  my care of the patient were reviewed by me and considered in my medical decision making (see chart for details).     Discussed local allergic reaction.  Discussed avoidance.  Discussed immediate administration of Benadryl.  Should go to ER immediately for any difficulty swallowing, breathing, voice change2 Final Clinical Impressions(s) / UC Diagnoses   Final diagnoses:  Allergic reaction to insect sting, accidental or unintentional, initial encounter     Discharge Instructions     Continue to use ice to the area. Take Benadryl every 6 hours for itching and for the allergic reaction. Take prednisone twice a day as an anti-inflammatory. Prednisone can temporarily raise your blood sugar.  This is usually a moderate increase that does not require change in medication or treatment. Return as needed.  Make sure that you come back promptly for any worsening swelling, trouble breathing, worsening comfort   ED Prescriptions    Medication Sig Dispense Auth. Provider   predniSONE (DELTASONE) 20 MG tablet Take 1 tablet (20 mg total) by mouth 2 (two) times daily with a meal. 10 tablet Raylene Everts, MD     Controlled Substance Prescriptions [place] Controlled Substance Registry consulted? Not Applicable   Raylene Everts, MD 02/10/18 1311

## 2018-02-12 ENCOUNTER — Telehealth: Payer: Self-pay

## 2018-02-12 NOTE — Telephone Encounter (Signed)
We received a fax from [facility] and filled it out for him to continue to get assistance from them for his Lialda. His financial situation has not changed. Faxed form to [phone] and will send it down to be scanned into epic.

## 2018-03-10 ENCOUNTER — Telehealth: Payer: Self-pay | Admitting: Internal Medicine

## 2018-03-10 NOTE — Telephone Encounter (Signed)
Pt having a UC flare, needs some advise.

## 2018-03-10 NOTE — Telephone Encounter (Signed)
Patient reports that he is having 3-4 diarrhea stools for 3-4 days.  Denies blood, urgency or other complains. He is currently maintained on Lialda 2.4 gm BID, no current prednisone use. Please advise

## 2018-03-11 NOTE — Telephone Encounter (Signed)
I have a 345 open tomorrow - put him on for that please

## 2018-03-11 NOTE — Telephone Encounter (Signed)
Left message for patient to call back  

## 2018-03-11 NOTE — Telephone Encounter (Signed)
Patient called to confirm appt

## 2018-03-12 ENCOUNTER — Encounter: Payer: Self-pay | Admitting: Internal Medicine

## 2018-03-12 ENCOUNTER — Ambulatory Visit: Payer: Medicare Other | Admitting: Internal Medicine

## 2018-03-12 DIAGNOSIS — K5150 Left sided colitis without complications: Secondary | ICD-10-CM | POA: Diagnosis not present

## 2018-03-12 NOTE — Assessment & Plan Note (Addendum)
Having a flare again it seems - 2-3 x a year for steroids Evaluate with Flex sig tomorrow. The risks and benefits as well as alternatives of endoscopic procedure(s) have been discussed and reviewed. All questions answered. The patient agrees to proceed.

## 2018-03-12 NOTE — Patient Instructions (Signed)
  You have been scheduled for a flexible sigmoidoscopy. Please follow the written instructions given to you at your visit today. If you use inhalers (even only as needed), please bring them with you on the day of your procedure.  I appreciate the opportunity to care for you. Silvano Rusk, MD, [facility]

## 2018-03-12 NOTE — Progress Notes (Signed)
Angel Frazier 62 y.o. 1955-08-22 [id number]  Assessment & Plan:  LEFT SIDED ULCERATIVE COLITIS Having a flare again it seems - 2-3 x a year for steroids Evaluate with Flex sig tomorrow. The risks and benefits as well as alternatives of endoscopic procedure(s) have been discussed and reviewed. All questions answered. The patient agrees to proceed.    Cc;Hunter, Brayton Mars, MD   Subjective:   Chief Complaint:  HPI Several weeks of abd cramps and worsening diarrhea. Thinks he is gettting a flare again. Taking 4.8 g/d Lialda  No bleeding to speak of.  Meds stable  No recent Abx  Did have a facial wasp sting and take prednisone for a few days (with Benadryl) in early September.  Allergies  Allergen Reactions  . Mercaptopurine Other (See Comments)    Caused Pancreatitis   Current Meds  Medication Sig  . ACCU-CHEK SOFTCLIX LANCETS lancets TEST FOUR TIMES A DAY E11.9  . Ascorbic Acid (VITAMIN C) 1000 MG tablet Take 1,000 mg by mouth daily. Reported on 09/28/2015  . aspirin 81 MG tablet Take 81 mg by mouth daily. Reported on 09/28/2015  . atorvastatin (LIPITOR) 20 MG tablet Take 20 mg by mouth once a week.  . blood glucose meter kit and supplies Dispense based on patient and insurance preference. Use up to four times daily as directed. (FOR ICD-9 250.00, 250.01).  . Calcium Carbonate-Vitamin D (CALCIUM-VITAMIN D3 PO) Take 1 tablet by mouth daily. Reported on 09/28/2015  . cetirizine HCl (ZYRTEC) 5 MG/5ML SOLN Take 10 mLs (10 mg total) by mouth daily.  . Cholecalciferol (VITAMIN D) 2000 units CAPS Take 1 capsule by mouth daily.  Marland Kitchen gabapentin (NEURONTIN) 300 MG capsule Take 300 mg by mouth 3 (three) times daily.   Marland Kitchen glucose blood (ACCU-CHEK AVIVA PLUS) test strip TEST FOUR TIMES DAILY AS DIRECTED BY PRESCRIBER  . mesalamine (LIALDA) 1.2 g EC tablet Take 2 tablets (2.4 g total) by mouth 2 (two) times daily.  Marland Kitchen METHOCARBAMOL PO Take 500 mg by mouth daily as needed (for muscle  spasms).   . Multiple Vitamins-Minerals (CENTRUM SILVER PO) Take 1 tablet by mouth daily. Reported on 09/28/2015  . Omega-3 Fatty Acids (FISH OIL) 300 MG CAPS Take 300 mg by mouth daily.   . pioglitazone-metformin (ACTOPLUS MET) 15-850 MG tablet TAKE 1 TABLET BY MOUTH ONCE DAILY  . predniSONE (DELTASONE) 20 MG tablet Take 1 tablet (20 mg total) by mouth 2 (two) times daily with a meal.  . vitamin B-12 (CYANOCOBALAMIN) 1000 MCG tablet Take 2,500 mcg by mouth daily. Reported on 09/28/2015   Past Medical History:  Diagnosis Date  .  vitamin D deficiency 01/24/2013   01/23/13 - level 29 - supplements 50k IU weekly x 6 recommended   . Anabolic steroid abuse   . Avascular necrosis of femoral head (HCC)    bilateral  . Bleeding hemorrhoids   . Colitis 08/06/2013   Mildly actibr colitis  . Diabetes mellitus without complication ([place]) 9485   type 2  . Drug-induced acute pancreatitis - 6MP 09/07/2015  . Erectile dysfunction   . Hypertension   . Left sided ulcerative colitis ([place])   . Spinal stenosis of lumbar region at multiple levels    Past Surgical History:  Procedure Laterality Date  . COLONOSCOPY     multiple  . KNEE ARTHROSCOPY Right   . SIGMOIDOSCOPY     Social History   Social History Narrative   Single never married. 1 son 66 in 2018. 1  grandson 109 in 2018. 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   Review of Systems As above  Objective:   Physical Exam BP (!) 170/90   Pulse 100   Ht 5' 9"  (1.753 m)   Wt 247 lb 6.4 oz (112.2 kg)   BMI 36.53 kg/m  Eyes anicteric abd soft NT BS +   15 minutes time spent with patient > half in counseling coordination of care

## 2018-03-13 ENCOUNTER — Ambulatory Visit (AMBULATORY_SURGERY_CENTER): Payer: Medicare Other | Admitting: Internal Medicine

## 2018-03-13 ENCOUNTER — Encounter: Payer: Self-pay | Admitting: Internal Medicine

## 2018-03-13 ENCOUNTER — Telehealth: Payer: Self-pay

## 2018-03-13 VITALS — BP 127/88 | HR 59 | Temp 96.9°F | Resp 19 | Ht 69.0 in | Wt 247.0 lb

## 2018-03-13 DIAGNOSIS — K5150 Left sided colitis without complications: Secondary | ICD-10-CM

## 2018-03-13 DIAGNOSIS — E785 Hyperlipidemia, unspecified: Secondary | ICD-10-CM

## 2018-03-13 DIAGNOSIS — K5190 Ulcerative colitis, unspecified, without complications: Secondary | ICD-10-CM | POA: Diagnosis not present

## 2018-03-13 DIAGNOSIS — K51518 Left sided colitis with other complication: Secondary | ICD-10-CM | POA: Diagnosis not present

## 2018-03-13 MED ORDER — SODIUM CHLORIDE 0.9 % IV SOLN: 500.0000 mL | INTRAVENOUS | Status: DC

## 2018-03-13 MED ORDER — PREDNISONE 10 MG PO TABS: 40.0000 mg | ORAL_TABLET | Freq: Every day | ORAL | 0 refills | Status: DC

## 2018-03-13 NOTE — Op Note (Signed)
[facility] Patient Name: Angel Frazier Procedure Date: 03/13/2018 1:03 PM MRN: [id number] Endoscopist: Gatha Mayer , MD Age: 62 Referring MD:  Date of Birth: 07/12/1955 Gender: Male Account #: [id number] Procedure:                Flexible Sigmoidoscopy Indications:              Left-sided chronic ulcerative colitis, Follow-up of                            left-sided chronic ulcerative colitis, Disease                            activity assessment of left-sided chronic                            ulcerative colitis Medicines:                Propofol per Anesthesia, Monitored Anesthesia Care Procedure:                Pre-Anesthesia Assessment:                           - Prior to the procedure, a History and Physical                            was performed, and patient medications and                            allergies were reviewed. The patient's tolerance of                            previous anesthesia was also reviewed. The risks                            and benefits of the procedure and the sedation                            options and risks were discussed with the patient.                            All questions were answered, and informed consent                            was obtained. Prior Anticoagulants: The patient has                            taken no previous anticoagulant or antiplatelet                            agents. ASA Grade Assessment: II - A patient with                            mild systemic disease. After reviewing the risks  and benefits, the patient was deemed in                            satisfactory condition to undergo the procedure.                           After obtaining informed consent, the scope was                            passed under direct vision. The Model [id number]-[id number]                            [id number]) scope was introduced through the anus                            and advanced to the  the splenic flexure. The                            flexible sigmoidoscopy was accomplished without                            difficulty. The patient tolerated the procedure                            well. The quality of the bowel preparation was                            excellent. Scope In: Scope Out: Findings:                 The perianal and digital rectal examinations were                            normal. Pertinent negatives include normal prostate                            (size, shape, and consistency).                           Inflammation was found in a continuous and                            circumferential pattern from the rectum to the                            descending colon. This was graded as Mayo Score 2                            (moderate, with marked erythema, absent vascular                            pattern, friability, erosions). Biopsies were taken                            with a cold forceps for histology. Verification  of                            patient identification for the specimen was done.                            Estimated blood loss was minimal. Complications:            No immediate complications. Estimated Blood Loss:     Estimated blood loss was minimal. Impression:               - Moderately active (Mayo Score 2) left-sided                            ulcerative colitis. Biopsied. Recommendation:           - Use prednisone 40 mg PO once a day.                           - Will advise taper when I call him next week                           need to think about adding immunomodulator vs                            biologic/both Gatha Mayer, MD 03/13/2018 1:19:51 PM This report has been signed electronically.

## 2018-03-13 NOTE — Patient Instructions (Addendum)
   There is active inflammation in the entire left side of the colon.  It looks like the ulcerative colitis but to make sure no infection I took biopsies.  I am starting you on the prednisone again to help.  We need to think about adding a new medication.  I plan to discuss with you after pathology results are in.  I appreciate the opportunity to care for you. Gatha Mayer, MD, FACG   YOU HAD AN ENDOSCOPIC PROCEDURE TODAY: Refer to the procedure report and other information in the discharge instructions given to you for any specific questions about what was found during the examination. If this information does not answer your questions, please call [facility] office at [phone] to clarify.   YOU SHOULD EXPECT: Some feelings of bloating in the abdomen. Passage of more gas than usual. Walking can help get rid of the air that was put into your GI tract during the procedure and reduce the bloating. If you had a lower endoscopy (such as a colonoscopy or flexible sigmoidoscopy) you may notice spotting of blood in your stool or on the toilet paper. Some abdominal soreness may be present for a day or two, also.  DIET: Your first meal following the procedure should be a light meal and then it is ok to progress to your normal diet. A half-sandwich or bowl of soup is an example of a good first meal. Heavy or fried foods are harder to digest and may make you feel nauseous or bloated. Drink plenty of fluids but you should avoid alcoholic beverages for 24 hours. If you had a esophageal dilation, please see attached instructions for diet.    ACTIVITY: Your care partner should take you home directly after the procedure. You should plan to take it easy, moving slowly for the rest of the day. You can resume normal activity the day after the procedure however YOU SHOULD NOT DRIVE, use power tools, machinery or perform tasks that involve climbing or major physical exertion for 24 hours (because of the  sedation medicines used during the test).   SYMPTOMS TO REPORT IMMEDIATELY: A gastroenterologist can be reached at any hour. Please call [phone]  for any of the following symptoms:  Following lower endoscopy (colonoscopy, flexible sigmoidoscopy) Excessive amounts of blood in the stool  Significant tenderness, worsening of abdominal pains  Swelling of the abdomen that is new, acute  Fever of 100 or higher   FOLLOW UP:  If any biopsies were taken you will be contacted by phone or by letter within the next 1-3 weeks. Call [phone]  if you have not heard about the biopsies in 3 weeks.  Please also call with any specific questions about appointments or follow up tests.

## 2018-03-13 NOTE — Progress Notes (Signed)
To recovery, report to RN, VSS. 

## 2018-03-13 NOTE — Telephone Encounter (Signed)
Pt coming for labs 03/17/18. Please place future order/s.  Thank you.

## 2018-03-13 NOTE — Progress Notes (Signed)
Called to room to assist during endoscopic procedure.  Patient ID and intended procedure confirmed with present staff. Received instructions for my participation in the procedure from the performing physician.  

## 2018-03-14 ENCOUNTER — Telehealth: Payer: Self-pay

## 2018-03-14 NOTE — Telephone Encounter (Signed)
Follow up call made, number identifier, left a message.

## 2018-03-14 NOTE — Telephone Encounter (Signed)
  Follow up Call-  Call Katha [facility] number 03/13/2018  Post procedure Call [facility] [place] phone  # [phone]  Permission to leave phone message Yes  Some recent data might be hidden     Patient questions:  Do you have a fever, pain , or abdominal swelling? No. Pain Score  0 *  Have you tolerated food without any problems? Yes.    Have you been able to return to your normal activities? Yes.    Do you have any questions about your discharge instructions: Diet   No. Medications  No. Follow up visit  No.  Do you have questions or concerns about your Care? No.  Actions: * If pain score is 4 or above: No action needed, pain <4.

## 2018-03-14 NOTE — Telephone Encounter (Signed)
Can order direct LDL under hyperlipidemia

## 2018-03-17 ENCOUNTER — Other Ambulatory Visit (INDEPENDENT_AMBULATORY_CARE_PROVIDER_SITE_OTHER): Payer: Medicare Other

## 2018-03-17 DIAGNOSIS — E785 Hyperlipidemia, unspecified: Secondary | ICD-10-CM

## 2018-03-17 LAB — LDL CHOLESTEROL, DIRECT: Direct LDL: 65 mg/dL

## 2018-03-25 ENCOUNTER — Other Ambulatory Visit: Payer: Self-pay | Admitting: Endocrinology

## 2018-03-25 NOTE — Progress Notes (Signed)
[facility] - no letter, no recall  Office  Please call Angel Frazier - let him know bxs show his colitis as expected - no infection, other problems  Please get a diarrhea, pain and bleeding update and if sig better can reduce prednisone to 30 mg daily  I plan to call him later this week re: other tx plans

## 2018-03-25 NOTE — Progress Notes (Signed)
I left him a message to reduce to 30 mg and will call him later about biologics

## 2018-03-26 ENCOUNTER — Telehealth: Payer: Self-pay | Admitting: Endocrinology

## 2018-03-26 NOTE — Telephone Encounter (Signed)
This was taken care of this morning. Thanks

## 2018-03-26 NOTE — Telephone Encounter (Signed)
Per [facility] "Caller states that he needs a refill of his metformin. He has a couple pills left."

## 2018-04-07 NOTE — Progress Notes (Signed)
Left a message that I will call him again to follow-up and plan next treatment steps

## 2018-04-10 ENCOUNTER — Other Ambulatory Visit: Payer: Self-pay

## 2018-04-10 DIAGNOSIS — Z7952 Long term (current) use of systemic steroids: Secondary | ICD-10-CM

## 2018-04-10 DIAGNOSIS — K5150 Left sided colitis without complications: Secondary | ICD-10-CM

## 2018-04-10 NOTE — Progress Notes (Signed)
I spoke to him He is improved  Plan: 1) Reduce prednisone to 20 mg daily and if ok in 1 week go to 10 mg daily 2) Labs - he will come Monday (heading out of town) - TPMT, CBC, CMET - dx Left UC and long-term use immunosuppressant - anticipate treating with 6 MP 3) needs December appt please

## 2018-04-14 ENCOUNTER — Other Ambulatory Visit (INDEPENDENT_AMBULATORY_CARE_PROVIDER_SITE_OTHER): Payer: Medicare Other

## 2018-04-14 DIAGNOSIS — Z7952 Long term (current) use of systemic steroids: Secondary | ICD-10-CM | POA: Diagnosis not present

## 2018-04-14 DIAGNOSIS — K5150 Left sided colitis without complications: Secondary | ICD-10-CM

## 2018-04-14 LAB — CBC WITH DIFFERENTIAL/PLATELET
BASOS PCT: 0.2 % (ref 0.0–3.0)
Basophils Absolute: 0 10*3/uL (ref 0.0–0.1)
EOS PCT: 0.6 % (ref 0.0–5.0)
Eosinophils Absolute: 0 10*3/uL (ref 0.0–0.7)
HEMATOCRIT: 40.7 % (ref 39.0–52.0)
Hemoglobin: 13.6 g/dL (ref 13.0–17.0)
LYMPHS PCT: 24.2 % (ref 12.0–46.0)
Lymphs Abs: 1.5 10*3/uL (ref 0.7–4.0)
MCHC: 33.4 g/dL (ref 30.0–36.0)
MCV: 95.7 fl (ref 78.0–100.0)
MONOS PCT: 7.1 % (ref 3.0–12.0)
Monocytes Absolute: 0.4 10*3/uL (ref 0.1–1.0)
Neutro Abs: 4.3 10*3/uL (ref 1.4–7.7)
Neutrophils Relative %: 67.9 % (ref 43.0–77.0)
PLATELETS: 193 10*3/uL (ref 150.0–400.0)
RBC: 4.25 Mil/uL (ref 4.22–5.81)
RDW: 13.5 % (ref 11.5–15.5)
WBC: 6.3 10*3/uL (ref 4.0–10.5)

## 2018-04-14 LAB — COMPREHENSIVE METABOLIC PANEL
ALT: 23 U/L (ref 0–53)
AST: 20 U/L (ref 0–37)
Albumin: 4 g/dL (ref 3.5–5.2)
Alkaline Phosphatase: 31 U/L — ABNORMAL LOW (ref 39–117)
BUN: 21 mg/dL (ref 6–23)
CALCIUM: 9.3 mg/dL (ref 8.4–10.5)
CHLORIDE: 100 meq/L (ref 96–112)
CO2: 29 meq/L (ref 19–32)
CREATININE: 1.05 mg/dL (ref 0.40–1.50)
GFR: 91.44 mL/min (ref >60.00)
Glucose, Bld: 128 mg/dL — ABNORMAL HIGH (ref 70–99)
POTASSIUM: 3.7 meq/L (ref 3.5–5.1)
Sodium: 139 mEq/L (ref 135–145)
Total Bilirubin: 0.6 mg/dL (ref 0.2–1.2)
Total Protein: 7.1 g/dL (ref 6.0–8.3)

## 2018-04-20 LAB — THIOPURINE METHYLTRANSFERASE (TPMT), RBC: Thiopurine Methyltransferase, RBC: 13 nmol/hr/mL RBC

## 2018-04-23 ENCOUNTER — Encounter: Payer: Self-pay | Admitting: Internal Medicine

## 2018-04-23 ENCOUNTER — Other Ambulatory Visit: Payer: Self-pay

## 2018-04-23 DIAGNOSIS — Z7952 Long term (current) use of systemic steroids: Secondary | ICD-10-CM

## 2018-04-23 DIAGNOSIS — K5150 Left sided colitis without complications: Secondary | ICD-10-CM

## 2018-04-23 NOTE — Progress Notes (Signed)
Tell Angel Frazier time to start the medication  Rx 6 mercaptopurine 50 mg tabs take 2 (100 mg total) daily  # 60  1 refill  Needs to do a CBC and LFT 2 weeks after starting   I should se him in 1 month from now

## 2018-04-24 ENCOUNTER — Telehealth: Payer: Self-pay

## 2018-04-24 NOTE — Telephone Encounter (Signed)
[employer] papers filled out and signed to continue his lialda assistance program. Faxed to [phone]. Once approved will send to be scanned into epic.

## 2018-04-25 NOTE — Telephone Encounter (Signed)
Patient informed that his assistance papers were faxed out yesterday to [facility].

## 2018-05-13 ENCOUNTER — Encounter (HOSPITAL_COMMUNITY): Payer: Self-pay | Admitting: Emergency Medicine

## 2018-05-13 ENCOUNTER — Other Ambulatory Visit: Payer: Self-pay

## 2018-05-13 ENCOUNTER — Ambulatory Visit (HOSPITAL_COMMUNITY)
Admission: EM | Admit: 2018-05-13 | Discharge: 2018-05-13 | Disposition: A | Discharge status: Home / Self Care | Payer: Medicare Other | Attending: Family Medicine | Admitting: Family Medicine

## 2018-05-13 DIAGNOSIS — I872 Venous insufficiency (chronic) (peripheral): Secondary | ICD-10-CM | POA: Insufficient documentation

## 2018-05-13 MED ORDER — MOMETASONE FUROATE 0.1 % EX CREA: 1.0000 "application " | TOPICAL_CREAM | Freq: Every day | CUTANEOUS | 0 refills | Status: DC

## 2018-05-13 MED ORDER — FUROSEMIDE 20 MG PO TABS: 20.0000 mg | ORAL_TABLET | Freq: Every day | ORAL | 0 refills | Status: DC

## 2018-05-13 NOTE — ED Triage Notes (Signed)
PT reports bruising over left calf, no injury.

## 2018-05-13 NOTE — ED Provider Notes (Signed)
[facility]    CSN: [id number] Arrival date & time: 05/13/18  1556     History   Chief Complaint Chief Complaint  Patient presents with  . Leg Pain    HPI KALIL WOESSNER is a 62 y.o. male.   HPI  No trauma Discoloration to the LLE for a few days.  Has chronic swelling.   Well controlled DM and hypertension,  Last A1c was 5.8 No pain,  No fever,  No bleeding, no wound On chronic steroids for ulcerative colitis Good about complying with medical care and recomendations   Past Medical History:  Diagnosis Date  .  vitamin D deficiency 01/24/2013   01/23/13 - level 29 - supplements 50k IU weekly x 6 recommended   . Anabolic steroid abuse   . Avascular necrosis of femoral head (HCC)    bilateral  . Bleeding hemorrhoids   . Colitis 08/06/2013   Mildly actibr colitis  . Diabetes mellitus without complication ([place]) 4665   type 2  . Drug-induced acute pancreatitis - 6MP 09/07/2015  . Erectile dysfunction   . Hypertension   . Left sided ulcerative colitis ([place])   . Spinal stenosis of lumbar region at multiple levels     Patient Active Problem List   Diagnosis Date Noted  . Pulmonary nodules 09/07/2015  . Controlled type 2 diabetes mellitus ([place]) 08/31/2015  . Long-term use of immunosuppressant medication - 6 MP 07/12/2015  . Long term current use of systemic steroids 11/18/2014  . Avascular necrosis of femoral heads 11/18/2014  . Hemorrhoids, internal, with bleeding 06/22/2014  . Vitamin D deficiency 01/24/2013  . ED (erectile dysfunction) 05/21/2012  . LEFT SIDED ULCERATIVE COLITIS 08/11/2008    Past Surgical History:  Procedure Laterality Date  . COLONOSCOPY     multiple  . KNEE ARTHROSCOPY Right   . SIGMOIDOSCOPY         Home Medications    Prior to Admission medications   Medication Sig Start Date End Date Taking? Authorizing Provider  ACCU-CHEK SOFTCLIX LANCETS lancets TEST FOUR TIMES A DAY E11.9 12/27/17   [name], MD  Ascorbic Acid  (VITAMIN C) 1000 MG tablet Take 1,000 mg by mouth daily. Reported on 09/28/2015    [provider]  aspirin 81 MG tablet Take 81 mg by mouth daily. Reported on 09/28/2015    [provider]  atorvastatin (LIPITOR) 20 MG tablet Take 20 mg by mouth once a week.    [provider]  blood glucose meter kit and supplies Dispense based on patient and insurance preference. Use up to four times daily as directed. (FOR ICD-9 250.00, 250.01). 04/09/17   [name], MD  Calcium Carbonate-Vitamin D (CALCIUM-VITAMIN D3 PO) Take 1 tablet by mouth daily. Reported on 09/28/2015    [provider]  Cholecalciferol (VITAMIN D) 2000 units CAPS Take 1 capsule by mouth daily.    [provider]  furosemide (LASIX) 20 MG tablet Take 1 tablet (20 mg total) by mouth daily. 05/13/18   Raylene Everts, MD  glucose blood (ACCU-CHEK AVIVA PLUS) test strip TEST FOUR TIMES DAILY AS DIRECTED BY PRESCRIBER 12/27/17   [name], MD  mesalamine (LIALDA) 1.2 g EC tablet Take 2 tablets (2.4 g total) by mouth 2 (two) times daily. 12/11/17   Gatha Mayer, MD  METHOCARBAMOL PO Take 500 mg by mouth daily as needed (for muscle spasms).     [provider]  mometasone (ELOCON) 0.1 % cream  Apply 1 application topically daily. 05/13/18   Raylene Everts, MD  Multiple Vitamins-Minerals (CENTRUM SILVER PO) Take 1 tablet by mouth daily. Reported on 09/28/2015    [provider]  Omega-3 Fatty Acids (FISH OIL) 300 MG CAPS Take 300 mg by mouth daily.     [provider]  pioglitazone-metformin (ACTOPLUS MET) 15-850 MG tablet TAKE 1 TABLET BY MOUTH ONCE DAILY 03/26/18   Renato Shin, MD  predniSONE (DELTASONE) 10 MG tablet Take 4 tablets (40 mg total) by mouth daily with breakfast. Will advise taper later 03/13/18   Gatha Mayer, MD  vitamin B-12 (CYANOCOBALAMIN) 1000 MCG tablet Take 2,500 mcg by mouth daily. Reported on 09/28/2015    [provider]      Family History Family History  Problem Relation Age of Onset  . Diabetes Mother   . Diabetes Unknown        cousin  . Hypertension Unknown   . Diabetes type I Son   . Colon cancer Neg Hx   . Esophageal cancer Neg Hx   . Rectal cancer Neg Hx   . Stomach cancer Neg Hx     Social History Social History   Tobacco Use  . Smoking status: Former Smoker    Packs/day: 1.00    Years: 6.00    Pack years: 6.00    Types: Cigarettes    Last attempt to quit: 06/04/2000    Years since quitting: 17.9  . Smokeless tobacco: Never Used  Substance Use Topics  . Alcohol use: Yes    Comment: occasionally  . Drug use: No     Allergies   Mercaptopurine   Review of Systems Review of Systems   Physical Exam Triage Vital Signs ED Triage Vitals  Enc Vitals Group     BP 05/13/18 1649 (!) 166/112     Pulse Rate 05/13/18 1649 88     Resp 05/13/18 1649 18     Temp 05/13/18 1649 97.9 F (36.6 C)     Temp Source 05/13/18 1649 Oral     SpO2 05/13/18 1649 95 %     Weight --      Height --      Head Circumference --      Peak Flow --      Pain Score 05/13/18 1710 8     Pain Loc --      Pain Edu? --      Excl. in [place]? --   Repeat BP 150/99 No data found.  Updated Vital Signs BP (!) 166/112 (BP Location: Left Arm)   Pulse 88   Temp 97.9 F (36.6 C) (Oral)   Resp 18   SpO2 95%  :     Physical Exam  Constitutional: He appears well-developed and well-nourished. No distress.  HENT:  Head: Normocephalic and atraumatic.  Mouth/Throat: Oropharynx is clear and moist.  Eyes: Pupils are equal, round, and reactive to light. Conjunctivae are normal.  Neck: Normal range of motion.  Cardiovascular: Normal rate, regular rhythm and normal heart sounds.  Rare ectopy  Pulmonary/Chest: Effort normal and breath sounds normal. No respiratory distress.  Abdominal: Soft. He exhibits no distension.  Musculoskeletal: Normal range of motion. He exhibits edema.  Neurological: He is alert.  Skin:  Skin is warm and dry.  LLE with erythema and induration over the shin as seen in photo.  NON tender.  No wound.  The ecchymosis inferior to the redness is nontender,  Psychiatric: He has a normal mood  and affect. His behavior is normal.       UC Treatments / Results  Labs (all labs ordered are listed, but only abnormal results are displayed) Labs Reviewed - No data to display  EKG None  Radiology No results found.  Procedures Procedures (including critical care time)  Medications Ordered in UC Medications - No data to display  Initial Impression / Assessment and Plan / UC Course  I have reviewed the triage vital signs and the nursing notes.  Pertinent labs & imaging results that were available during my care of the patient were reviewed by me and considered in my medical decision making (see chart for details).       Final Clinical Impressions(s) / UC Diagnoses   Final diagnoses:  Stasis dermatitis without varicosities     Discharge Instructions     Use the cream once a day May use other lotion in addition if needed Elevate legs as much as possible to reduce swelling Take the Lasix once a day for 7 days.  This will cause increased urination.  Eat potassium foods while taking Lasix.  (Oranges, bananas) No salt Putting on a compression sock once a day in the morning before you walk around will help prevent swelling Follow-up with your primary care doctor    ED Prescriptions    Medication Sig Dispense Auth. Provider   mometasone (ELOCON) 0.1 % cream Apply 1 application topically daily. 92 g Raylene Everts, MD   furosemide (LASIX) 20 MG tablet Take 1 tablet (20 mg total) by mouth daily. 7 tablet Raylene Everts, MD     Controlled Substance Prescriptions [place] Controlled Substance Registry consulted? Not Applicable   Raylene Everts, MD 05/13/18 1827

## 2018-05-13 NOTE — Discharge Instructions (Addendum)
Use the cream once a day May use other lotion in addition if needed Elevate legs as much as possible to reduce swelling Take the Lasix once a day for 7 days.  This will cause increased urination.  Eat potassium foods while taking Lasix.  (Oranges, bananas) No salt Putting on a compression sock once a day in the morning before you walk around will help prevent swelling Follow-up with your primary care doctor

## 2018-05-21 ENCOUNTER — Telehealth: Payer: Self-pay

## 2018-05-21 NOTE — Telephone Encounter (Signed)
I spoke with Angel Frazier at [facility], phone # [phone] to follow up on his Help at Hand application. She found it attached to his old application. She is going to get it separated and hopefully we will hear from them soon.

## 2018-05-23 ENCOUNTER — Encounter: Payer: Self-pay | Admitting: Internal Medicine

## 2018-05-23 ENCOUNTER — Ambulatory Visit: Payer: Medicare Other | Admitting: Internal Medicine

## 2018-05-23 DIAGNOSIS — K5150 Left sided colitis without complications: Secondary | ICD-10-CM | POA: Diagnosis not present

## 2018-05-23 DIAGNOSIS — Z7952 Long term (current) use of systemic steroids: Secondary | ICD-10-CM

## 2018-05-23 NOTE — Progress Notes (Signed)
Angel Frazier 62 y.o. 1955/10/08 [id number]  Assessment & Plan:  LEFT SIDED ULCERATIVE COLITIS Reduce prednisone to 5 mg  Review Medicare part D formulary to see if I can find a biologic agent that he might be able to afford Ultimately will have to prescribe it and see but I might get some direction He will bring that in and I will review it  Long term current use of systemic steroids Side effects reviewed again Cost is an issue and allergy to 6 MP  so that is why steroids have been used He had a normal bone density test in 2016, need to think about repeating that if he can afford it.   I appreciate the opportunity to care for this patient. CC: [name], MD     Subjective:   Chief Complaint: f/u Left-sided UC  HPI No c/o today - sxs under control On 10 mg prednisone I had recommended 6 MP but overlooked that he reacted to that in past and is intolerant. Cost of medications remains a problem. We discussed prednisone side effects again - boneloss and fractures, especially.   Allergies  Allergen Reactions  . Mercaptopurine Other (See Comments)    Caused Pancreatitis   Current Meds  Medication Sig  . ACCU-CHEK SOFTCLIX LANCETS lancets TEST FOUR TIMES A DAY E11.9  . Ascorbic Acid (VITAMIN C) 1000 MG tablet Take 1,000 mg by mouth daily. Reported on 09/28/2015  . aspirin 81 MG tablet Take 81 mg by mouth daily. Reported on 09/28/2015  . atorvastatin (LIPITOR) 20 MG tablet Take 20 mg by mouth once a week.  . blood glucose meter kit and supplies Dispense based on patient and insurance preference. Use up to four times daily as directed. (FOR ICD-9 250.00, 250.01).  . Calcium Carbonate-Vitamin D (CALCIUM-VITAMIN D3 PO) Take 1 tablet by mouth daily. Reported on 09/28/2015  . Cholecalciferol (VITAMIN D) 2000 units CAPS Take 1 capsule by mouth daily.  . furosemide (LASIX) 20 MG tablet Take 1 tablet (20 mg total) by mouth daily.  Marland Kitchen glucose blood (ACCU-CHEK AVIVA PLUS) test  strip TEST FOUR TIMES DAILY AS DIRECTED BY PRESCRIBER  . mesalamine (LIALDA) 1.2 g EC tablet Take 2 tablets (2.4 g total) by mouth 2 (two) times daily.  Marland Kitchen METHOCARBAMOL PO Take 500 mg by mouth daily as needed (for muscle spasms).   . mometasone (ELOCON) 0.1 % cream Apply 1 application topically daily.  . Multiple Vitamins-Minerals (CENTRUM SILVER PO) Take 1 tablet by mouth daily. Reported on 09/28/2015  . Omega-3 Fatty Acids (FISH OIL) 300 MG CAPS Take 300 mg by mouth daily.   . pioglitazone-metformin (ACTOPLUS MET) 15-850 MG tablet TAKE 1 TABLET BY MOUTH ONCE DAILY  . predniSONE (DELTASONE) 10 MG tablet Take 4 tablets (40 mg total) by mouth daily with breakfast. Will advise taper later  . vitamin B-12 (CYANOCOBALAMIN) 1000 MCG tablet Take 2,500 mcg by mouth daily. Reported on 09/28/2015   Past Medical History:  Diagnosis Date  .  vitamin D deficiency 01/24/2013   01/23/13 - level 29 - supplements 50k IU weekly x 6 recommended   . Anabolic steroid abuse   . Avascular necrosis of femoral head (HCC)    bilateral  . Bleeding hemorrhoids   . Colitis 08/06/2013   Mildly actibr colitis  . Diabetes mellitus without complication ([place]) 0109   type 2  . Drug-induced acute pancreatitis - 6MP 09/07/2015  . Erectile dysfunction   . Hypertension   . Left sided ulcerative  colitis ([place])   . Spinal stenosis of lumbar region at multiple levels    Past Surgical History:  Procedure Laterality Date  . COLONOSCOPY     multiple  . KNEE ARTHROSCOPY Right   . SIGMOIDOSCOPY     Social History   Social History Narrative   Single never married. 1 son 52 in 2018. 1 grandson 40 in 2018. Lives in [place] alone      Disability- low back issues   Roll up machine operator      Hobbies: enjoys tinkering in his shed, enjoys going to the gym   family history includes Diabetes in his mother and another family member; Diabetes type I in his son; Hypertension in an other family member.   Review of  Systems As above  Objective:   Physical Exam BP (!) 142/92 (BP Location: Left Arm, Patient Position: Sitting, Cuff Size: Normal)   Pulse 76   Ht _0  (1.753 m)   Wt 256 lb 8 oz (116.3 kg)   BMI 37.88 kg/m  NAD  15 minutes time spent with patient > half in counseling coordination of care

## 2018-05-23 NOTE — Patient Instructions (Signed)
  Dr Carlean Purl is reducing your prednisone to 33m daily.   Please bring your Medicare Part D formulary book to [place] we can figure out a biologic that would be approved.   Merry Christmas!   I appreciate the opportunity to care for you. CSilvano Rusk MD, [facility]

## 2018-05-23 NOTE — Assessment & Plan Note (Addendum)
Reduce prednisone to 5 mg  Review Medicare part D formulary to see if I can find a biologic agent that he might be able to afford Ultimately will have to prescribe it and see but I might get some direction He will bring that in and I will review it

## 2018-05-23 NOTE — Assessment & Plan Note (Addendum)
Side effects reviewed again Cost is an issue and allergy to 6 MP  so that is why steroids have been used He had a normal bone density test in 2016, need to think about repeating that if he can afford it.

## 2018-05-29 ENCOUNTER — Ambulatory Visit (INDEPENDENT_AMBULATORY_CARE_PROVIDER_SITE_OTHER): Payer: Medicare Other

## 2018-05-29 ENCOUNTER — Encounter (HOSPITAL_COMMUNITY): Payer: Self-pay | Admitting: Family Medicine

## 2018-05-29 ENCOUNTER — Ambulatory Visit (HOSPITAL_COMMUNITY)
Admission: EM | Admit: 2018-05-29 | Discharge: 2018-05-29 | Disposition: A | Discharge status: Home / Self Care | Payer: Medicare Other | Attending: Family Medicine | Admitting: Family Medicine

## 2018-05-29 DIAGNOSIS — R52 Pain, unspecified: Secondary | ICD-10-CM

## 2018-05-29 DIAGNOSIS — M25551 Pain in right hip: Secondary | ICD-10-CM | POA: Diagnosis not present

## 2018-05-29 DIAGNOSIS — M1611 Unilateral primary osteoarthritis, right hip: Secondary | ICD-10-CM | POA: Diagnosis not present

## 2018-05-29 MED ORDER — DICLOFENAC SODIUM 75 MG PO TBEC: 75.0000 mg | DELAYED_RELEASE_TABLET | Freq: Two times a day (BID) | ORAL | 0 refills | Status: DC

## 2018-05-29 NOTE — Discharge Instructions (Signed)
Your x-ray showed no acute changes.  Instead you have inflammation around the hip for which we are prescribing medicine that should help.

## 2018-05-29 NOTE — ED Provider Notes (Signed)
[place]    CSN: [id number] Arrival date & time: 05/29/18  1221     History   Chief Complaint Chief Complaint  Patient presents with  . Hip Pain    HPI Angel Frazier is a 62 y.o. male.   This is a 62 year old established patient who was just seen here 2 weeks ago.  He is complaining of right hip pain.  Pain began spontaneously 2 days ago.  He has had no trauma or fall.  Patient has a history of aseptic necrosis of the hip.  He is also been on chronic steroids for ulcerative colitis.  He has diabetes which is been reasonably well controlled.     Past Medical History:  Diagnosis Date  .  vitamin D deficiency 01/24/2013   01/23/13 - level 29 - supplements 50k IU weekly x 6 recommended   . Anabolic steroid abuse   . Avascular necrosis of femoral head (HCC)    bilateral  . Bleeding hemorrhoids   . Colitis 08/06/2013   Mildly actibr colitis  . Diabetes mellitus without complication ([place]) 7262   type 2  . Drug-induced acute pancreatitis - 6MP 09/07/2015  . Erectile dysfunction   . Hypertension   . Left sided ulcerative colitis ([place])   . Spinal stenosis of lumbar region at multiple levels     Patient Active Problem List   Diagnosis Date Noted  . Pulmonary nodules 09/07/2015  . Controlled type 2 diabetes mellitus ([place]) 08/31/2015  . Long term current use of systemic steroids 11/18/2014  . Avascular necrosis of femoral heads 11/18/2014  . Vitamin D deficiency 01/24/2013  . ED (erectile dysfunction) 05/21/2012  . LEFT SIDED ULCERATIVE COLITIS 08/11/2008    Past Surgical History:  Procedure Laterality Date  . COLONOSCOPY     multiple  . KNEE ARTHROSCOPY Right   . SIGMOIDOSCOPY         Home Medications    Prior to Admission medications   Medication Sig Start Date End Date Taking? Authorizing Provider  ACCU-CHEK SOFTCLIX LANCETS lancets TEST FOUR TIMES A DAY E11.9 12/27/17   [name], MD  Ascorbic Acid (VITAMIN C) 1000 MG tablet Take 1,000 mg  by mouth daily. Reported on 09/28/2015    [provider]  aspirin 81 MG tablet Take 81 mg by mouth daily. Reported on 09/28/2015    [provider]  atorvastatin (LIPITOR) 20 MG tablet Take 20 mg by mouth once a week.    [provider]  blood glucose meter kit and supplies Dispense based on patient and insurance preference. Use up to four times daily as directed. (FOR ICD-9 250.00, 250.01). 04/09/17   [name], MD  Calcium Carbonate-Vitamin D (CALCIUM-VITAMIN D3 PO) Take 1 tablet by mouth daily. Reported on 09/28/2015    [provider]  Cholecalciferol (VITAMIN D) 2000 units CAPS Take 1 capsule by mouth daily.    [provider]  diclofenac (VOLTAREN) 75 MG EC tablet Take 1 tablet (75 mg total) by mouth 2 (two) times daily. 05/29/18   Robyn Haber, MD  furosemide (LASIX) 20 MG tablet Take 1 tablet (20 mg total) by mouth daily. 05/13/18   Raylene Everts, MD  glucose blood (ACCU-CHEK AVIVA PLUS) test strip TEST FOUR TIMES DAILY AS DIRECTED BY PRESCRIBER 12/27/17   [name], MD  mesalamine (LIALDA) 1.2 g EC tablet Take 2 tablets (2.4 g total) by mouth 2 (two) times daily. 12/11/17   Gatha Mayer, MD  METHOCARBAMOL PO Take 500 mg by mouth daily as needed (for muscle spasms).     [provider]  mometasone (ELOCON) 0.1 % cream Apply 1 application topically daily. 05/13/18   Raylene Everts, MD  Multiple Vitamins-Minerals (CENTRUM SILVER PO) Take 1 tablet by mouth daily. Reported on 09/28/2015    [provider]  Omega-3 Fatty Acids (FISH OIL) 300 MG CAPS Take 300 mg by mouth daily.     [provider]  pioglitazone-metformin (ACTOPLUS MET) 15-850 MG tablet TAKE 1 TABLET BY MOUTH ONCE DAILY 03/26/18   Renato Shin, MD  predniSONE (DELTASONE) 10 MG tablet Take 4 tablets (40 mg total) by mouth daily with breakfast. Will advise taper later 03/13/18   Gatha Mayer, MD  vitamin B-12 (CYANOCOBALAMIN) 1000 MCG  tablet Take 2,500 mcg by mouth daily. Reported on 09/28/2015    [provider]    Family History Family History  Problem Relation Age of Onset  . Diabetes Mother   . Diabetes Other        cousin  . Hypertension Other   . Diabetes type I Son   . Colon cancer Neg Hx   . Esophageal cancer Neg Hx   . Rectal cancer Neg Hx   . Stomach cancer Neg Hx     Social History Social History   Tobacco Use  . Smoking status: Former Smoker    Packs/day: 1.00    Years: 6.00    Pack years: 6.00    Types: Cigarettes    Last attempt to quit: 06/04/2000    Years since quitting: 17.9  . Smokeless tobacco: Never Used  Substance Use Topics  . Alcohol use: Yes    Comment: occasionally  . Drug use: No     Allergies   Mercaptopurine   Review of Systems Review of Systems   Physical Exam Triage Vital Signs ED Triage Vitals  Enc Vitals Group     BP      Pulse      Resp      Temp      Temp src      SpO2      Weight      Height      Head Circumference      Peak Flow      Pain Score      Pain Loc      Pain Edu?      Excl. in [place]?    No data found.  Updated Vital Signs BP (!) 146/96 (BP Location: Left Arm)   Pulse 74   Temp 97.7 F (36.5 C) (Oral)   Resp 16   SpO2 96%    Physical Exam Vitals signs and nursing note reviewed.  Constitutional:      Appearance: Normal appearance. He is obese.  HENT:     Head: Normocephalic.     Mouth/Throat:     Mouth: Mucous membranes are moist.  Eyes:     Conjunctiva/sclera: Conjunctivae normal.  Pulmonary:     Effort: Pulmonary effort is normal.  Musculoskeletal:        General: Swelling present.     Comments: Pain with internal and external rotation of the right hip.  There is no localized tenderness of the hip.  He is able to flex and extend normally.  Skin:    General: Skin is warm and dry.  Neurological:     General: No focal deficit present.     Mental Status: He is alert.  Psychiatric:        Mood and Affect: Mood  normal.      UC Treatments / Results  Labs (all labs ordered are listed, but only abnormal results are displayed) Labs Reviewed - No data to display  EKG None  Radiology Dg Hip Unilat With Pelvis 2-3 Views Right  Result Date: 05/29/2018 CLINICAL DATA:  Right hip pain for 2 days.  No specific injury. EXAM: DG HIP (WITH OR WITHOUT PELVIS) 2-3V RIGHT COMPARISON:  CT scan 08/19/2015 FINDINGS: Remote focus of AVN involving the right femoral head. No acute fracture, significant degenerative changes or obvious joint effusion. The visualized right hemipelvis is intact. The pubic symphysis and right SI joint appear normal. IMPRESSION: 1. Small remote focus of right hip AVN. 2. No acute bony findings or significant degenerative changes. Electronically Signed   By: Marijo Sanes M.D.   On: 05/29/2018 14:49    Procedures Procedures (including critical care time)  Medications Ordered in UC Medications - No data to display  Initial Impression / Assessment and Plan / UC Course  I have reviewed the triage vital signs and the nursing notes.  Pertinent labs & imaging results that were available during my care of the patient were reviewed by me and considered in my medical decision making (see chart for details).    Final Clinical Impressions(s) / UC Diagnoses   Final diagnoses:  Right hip pain  Acute pain of right hip     Discharge Instructions     Your x-ray showed no acute changes.  Instead you have inflammation around the hip for which we are prescribing medicine that should help.    ED Prescriptions    Medication Sig Dispense Auth. Provider   diclofenac (VOLTAREN) 75 MG EC tablet Take 1 tablet (75 mg total) by mouth 2 (two) times daily. 14 tablet Robyn Haber, MD     Controlled Substance Prescriptions [place] Controlled Substance Registry consulted? Not Applicable   Robyn Haber, MD 05/29/18 [phone]

## 2018-05-29 NOTE — ED Triage Notes (Signed)
Pt presents to [facility] for assessment of three days of right hip pain.  Denies falls or known injuries.  Denies radiation.  C/o pain when bearing weight or rotating foot.

## 2018-06-16 NOTE — Telephone Encounter (Signed)
I spoke with Angel Frazier and they requested a letter about his lack of income. The letter has been faxed to them today so that they can proceed with his assistance form.

## 2018-06-19 NOTE — Telephone Encounter (Signed)
We received a fax today saying they could not process his assistance papers. They need more proof of his income. I called and told Angel Frazier and he will bring his disability papers on Monday for [place] to send them.

## 2018-06-20 NOTE — Telephone Encounter (Signed)
We received a fax today that Angel Frazier has been approved thru 05/20/2020 to get his Lialda. I have called and left this good information on his voice mail of his cell phone. I will have this fax scanned into epic.

## 2018-06-23 ENCOUNTER — Telehealth: Payer: Self-pay

## 2018-06-23 ENCOUNTER — Ambulatory Visit: Payer: Medicare Other | Admitting: Endocrinology

## 2018-06-23 ENCOUNTER — Encounter: Payer: Self-pay | Admitting: Endocrinology

## 2018-06-23 VITALS — BP 140/88 | HR 78 | Ht 69.0 in | Wt 263.8 lb

## 2018-06-23 DIAGNOSIS — K5150 Left sided colitis without complications: Secondary | ICD-10-CM

## 2018-06-23 DIAGNOSIS — E119 Type 2 diabetes mellitus without complications: Secondary | ICD-10-CM

## 2018-06-23 LAB — POCT GLYCOSYLATED HEMOGLOBIN (HGB A1C): HEMOGLOBIN A1C: 6 % — AB (ref 4.0–5.6)

## 2018-06-23 MED ORDER — ADALIMUMAB 40 MG/0.4ML ~~LOC~~ AJKT: 40.0000 mg | AUTO-INJECTOR | SUBCUTANEOUS | 6 refills | Status: DC

## 2018-06-23 MED ORDER — ADALIMUMAB 80 MG/0.8ML ~~LOC~~ AJKT: 160.0000 mg | AUTO-INJECTOR | Freq: Once | SUBCUTANEOUS | 0 refills | Status: DC

## 2018-06-23 MED ORDER — PIOGLITAZONE HCL-METFORMIN HCL 15-850 MG PO TABS: 1.0000 | ORAL_TABLET | Freq: Every day | ORAL | 3 refills | Status: DC

## 2018-06-23 NOTE — Telephone Encounter (Signed)
Patient notified rx sent Patient notified that he may need to use an assistance program to get it paid for.

## 2018-06-23 NOTE — Patient Instructions (Signed)
check your blood sugar once a day.  vary the time of day when you check, between before the 3 meals, and at bedtime.  also check if you have symptoms of your blood sugar being too high or too low.  please keep a record of the readings and bring it to your next appointment here (or you can bring the meter itself).  You can write it on any piece of paper.  please call us sooner if your blood sugar goes below 70, or if you have a lot of readings over 200.  Please continue the same medication.  Please come back for a follow-up appointment in 6 months.

## 2018-06-23 NOTE — Telephone Encounter (Signed)
-----   Message from Gatha Mayer, MD sent at 06/20/2018 12:45 PM EST ----- Regarding: Biologic Tx Please explain to Angel Frazier we are going to try to rx Humira for his UC   I am skeptical it will be affordable but need to see what we might do - I tried to look at his formulary but he brought the wrong book  Perhaps he could get some assistance from a biologic company?  Thanks

## 2018-06-23 NOTE — Progress Notes (Signed)
Subjective:    Patient ID: Angel Frazier, male    DOB: 10/07/1955, 63 y.o.   MRN: [id number]  HPI Pt returns for f/u of diabetes mellitus: DM type: 1, in remission (AKA atypical DM, intermittently ketosis-prone DM, etc).   Dx'ed: 8242 Complications: none Therapy: 2 oral meds.  DKA: once, at dx Severe hypoglycemia: never Pancreatitis: never Pancreatic imaging: normal on 2017 CT Other: he has been in remission since soon after dx. He took insulin for a brief time after dx;  Interval history: Meter is downloaded today, and the printout is scanned into the record.  cbg varies 83-123.  pt states he feels well in general.   Past Medical History:  Diagnosis Date  .  vitamin D deficiency 01/24/2013   01/23/13 - level 29 - supplements 50k IU weekly x 6 recommended   . Anabolic steroid abuse   . Avascular necrosis of femoral head (HCC)    bilateral  . Bleeding hemorrhoids   . Colitis 08/06/2013   Mildly actibr colitis  . Diabetes mellitus without complication ([place]) 3536   type 2  . Drug-induced acute pancreatitis - 6MP 09/07/2015  . Erectile dysfunction   . Hypertension   . Left sided ulcerative colitis ([place])   . Spinal stenosis of lumbar region at multiple levels     Past Surgical History:  Procedure Laterality Date  . COLONOSCOPY     multiple  . KNEE ARTHROSCOPY Right   . SIGMOIDOSCOPY      Social History   Socioeconomic History  . Marital status: Single    Spouse name: Not on file  . Number of children: 1  . Years of education: Not on file  . Highest education level: Not on file  Occupational History  . Occupation: retired    Fish farm manager: [employer]  . Financial resource strain: Not on file  . Food insecurity:    Worry: Not on file    Inability: Not on file  . Transportation needs:    Medical: Not on file    Non-medical: Not on file  Tobacco Use  . Smoking status: Former Smoker    Packs/day: 1.00    Years: 6.00    Pack years: 6.00   Types: Cigarettes    Last attempt to quit: 06/04/2000    Years since quitting: 18.0  . Smokeless tobacco: Never Used  Substance and Sexual Activity  . Alcohol use: Yes    Comment: occasionally  . Drug use: No  . Sexual activity: Not on file  Lifestyle  . Physical activity:    Days per week: Not on file    Minutes per session: Not on file  . Stress: Not on file  Relationships  . Social connections:    Talks on phone: Not on file    Gets together: Not on file    Attends religious service: Not on file    Active member of club or organization: Not on file    Attends meetings of clubs or organizations: Not on file    Relationship status: Not on file  . Intimate partner violence:    Fear of current or ex partner: Not on file    Emotionally abused: Not on file    Physically abused: Not on file    Forced sexual activity: Not on file  Other Topics Concern  . Not on file  Social History Narrative   Single never married. 1 son 48 in 2018. 1 grandson 51 in  2018. 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 ACCU-CHEK SOFTCLIX LANCETS lancets TEST FOUR TIMES A DAY E11.9 300 each 0  . Ascorbic Acid (VITAMIN C) 1000 MG tablet Take 1,000 mg by mouth daily. Reported on 09/28/2015    . aspirin 81 MG tablet Take 81 mg by mouth daily. Reported on 09/28/2015    . atorvastatin (LIPITOR) 20 MG tablet Take 20 mg by mouth once a week.    . blood glucose meter kit and supplies Dispense based on patient and insurance preference. Use up to four times daily as directed. (FOR ICD-9 250.00, 250.01). 1 each 0  . Calcium Carbonate-Vitamin D (CALCIUM-VITAMIN D3 PO) Take 1 tablet by mouth daily. Reported on 09/28/2015    . Cholecalciferol (VITAMIN D) 2000 units CAPS Take 1 capsule by mouth daily.    .  diclofenac (VOLTAREN) 75 MG EC tablet Take 1 tablet (75 mg total) by mouth 2 (two) times daily. 14 tablet 0  . furosemide (LASIX) 20 MG tablet Take 1 tablet (20 mg total) by mouth daily. 7 tablet 0  . glucose blood (ACCU-CHEK AVIVA PLUS) test strip TEST FOUR TIMES DAILY AS DIRECTED BY PRESCRIBER 350 each 0  . mesalamine (LIALDA) 1.2 g EC tablet Take 2 tablets (2.4 g total) by mouth 2 (two) times daily. 120 tablet 11  . METHOCARBAMOL PO Take 500 mg by mouth daily as needed (for muscle spasms).     . mometasone (ELOCON) 0.1 % cream Apply 1 application topically daily. 45 g 0  . Multiple Vitamins-Minerals (CENTRUM SILVER PO) Take 1 tablet by mouth daily. Reported on 09/28/2015    . Omega-3 Fatty Acids (FISH OIL) 300 MG CAPS Take 300 mg by mouth daily.     . predniSONE (DELTASONE) 10 MG tablet Take 4 tablets (40 mg total) by mouth daily with breakfast. Will advise taper later 120 tablet 0  . vitamin B-12 (CYANOCOBALAMIN) 1000 MCG tablet Take 2,500 mcg by mouth daily. Reported on 09/28/2015     No current facility-administered medications on file prior to visit.     Allergies  Allergen Reactions  . Mercaptopurine Other (See Comments)    Caused Pancreatitis    Family History  Problem Relation Age of Onset  . Diabetes Mother   . Diabetes Other        cousin  . Hypertension Other   . Diabetes type I Son   . Colon cancer Neg Hx   . Esophageal cancer Neg Hx   . Rectal cancer Neg Hx   . Stomach cancer Neg Hx     BP 140/88 (BP Location: Right Arm, Patient Position: Sitting, Cuff Size: Large)   Pulse 78   Ht 5' 9"  (1.753 m)   Wt 263 lb 12.8 oz (119.7 kg)   SpO2 94%   BMI 38.96 kg/m    Review of Systems He has gained weight    Objective:   Physical Exam VITAL SIGNS:  See vs page GENERAL: no distress Pulses: dorsalis pedis intact bilat.   MSK: no deformity of the feet CV: 1+ bilat leg edema Skin:  no ulcer on the feet.  normal color and temp on the feet. Neuro: sensation is intact  to touch on the  feet.     Lab Results  Component Value Date   HGBA1C 6.0 (A) 06/23/2018       Assessment & Plan:  Type 1 DM, in remission Edema, new.  prob due to pioglitazone.  As it is mild, we'll follow. Weight gain.  We discussed.  He declines surgery  Patient Instructions  check your blood sugar once a day.  vary the time of day when you check, between before the 3 meals, and at bedtime.  also check if you have symptoms of your blood sugar being too high or too low.  please keep a record of the readings and bring it to your next appointment here (or you can bring the meter itself).  You can write it on any piece of paper.  please call us sooner if your blood sugar goes below 70, or if you have a lot of readings over 200.  Please continue the same medication.  Please come back for a follow-up appointment in 6 months.

## 2018-06-25 NOTE — Telephone Encounter (Signed)
Humira has been approved by his insurance company.  From today- 09/16/18.  PA [id number] for 40 mg/0.4 ml pen.  I spoke with [facility] to inquire about the starter kit. 80 mg/0.8 pen starter kit has been approved under the same authorization number.    Out of pocket cost for starter kit is $50.00  Left message for patient to call back Information sent to [employer] for teaching.  

## 2018-06-26 ENCOUNTER — Other Ambulatory Visit: Payer: Self-pay | Admitting: Family Medicine

## 2018-06-26 NOTE — Telephone Encounter (Signed)
Left message for patient to call back  

## 2018-06-27 ENCOUNTER — Other Ambulatory Visit: Payer: Self-pay | Admitting: Family Medicine

## 2018-06-27 NOTE — Telephone Encounter (Signed)
I spoke with the patient he is notified that Humira was approved.  He is asked to contact [facility] and arrange delivery of Humira.  He is also advised he will get a call from [facility] to help him self inject.  He will call back if he has any trouble getting Humira

## 2018-07-03 NOTE — Telephone Encounter (Signed)
Labs ordered Left message for patient to come for labs.

## 2018-07-03 NOTE — Telephone Encounter (Signed)
OK  He needs quantiferon and Hep B S ag and B core Ab total  I had not redopne those until we found out if it was affordable  I also want him to see me in 8 weeks if/when we can arrange that

## 2018-07-03 NOTE — Addendum Note (Signed)
Addended by: Marlon Pel on: 07/03/2018 11:45 AM   Modules accepted: Orders

## 2018-07-04 ENCOUNTER — Other Ambulatory Visit: Payer: Medicare Other

## 2018-07-04 DIAGNOSIS — K5150 Left sided colitis without complications: Secondary | ICD-10-CM

## 2018-07-04 NOTE — Addendum Note (Signed)
Addended by: Marlon Pel on: 07/04/2018 09:17 AM   Modules accepted: Orders

## 2018-07-06 LAB — QUANTIFERON-TB GOLD PLUS
Mitogen-NIL: 6.27 IU/mL
NIL: 0.02 IU/mL
QuantiFERON-TB Gold Plus: NEGATIVE
TB1-NIL: 0 IU/mL
TB2-NIL: 0 IU/mL

## 2018-07-06 LAB — HEPATITIS B CORE ANTIBODY, TOTAL: Hep B Core Total Ab: NONREACTIVE

## 2018-07-06 LAB — HEPATITIS B SURFACE ANTIGEN: HEP B S AG: NONREACTIVE

## 2018-07-08 ENCOUNTER — Telehealth: Payer: Self-pay | Admitting: Internal Medicine

## 2018-07-08 NOTE — Progress Notes (Signed)
FYI  All pre-biologic testing here negative - ok for the Humira as planned

## 2018-07-09 NOTE — Telephone Encounter (Signed)
See lab results for additional details.

## 2018-07-14 ENCOUNTER — Telehealth: Payer: Self-pay | Admitting: Internal Medicine

## 2018-07-14 MED ORDER — PREDNISONE 5 MG PO TABS: 5.0000 mg | ORAL_TABLET | Freq: Every day | ORAL | 0 refills | Status: DC

## 2018-07-14 NOTE — Telephone Encounter (Signed)
PT advised that he is out of the med predniSONE (DELTASONE) and would like to know he he should continue to take it until the new med comes in or stop. JG

## 2018-07-14 NOTE — Telephone Encounter (Signed)
Stay on prednisone 5 mg qd  Rx 5 mg tabs # 90 no refill

## 2018-07-14 NOTE — Telephone Encounter (Signed)
Please advise Sir, thank you. 

## 2018-07-14 NOTE — Telephone Encounter (Signed)
Renell informed that rx being sent in, confirmed pharmacy.

## 2018-08-19 ENCOUNTER — Telehealth: Payer: Self-pay | Admitting: Internal Medicine

## 2018-08-19 NOTE — Telephone Encounter (Signed)
Pt called stating that he received Humira and wants to know if he should continue taking prednisone and lialda. Pls call him.

## 2018-08-19 NOTE — Telephone Encounter (Signed)
No answer/machine.  I will attempt to reach him at a later date

## 2018-08-25 NOTE — Telephone Encounter (Signed)
Pt is following back up on the previous msg.

## 2018-08-25 NOTE — Telephone Encounter (Signed)
Patient notified to remain on Humira and other meds.  Set up telephone visit next week with the patient at 09/02/18 10:30

## 2018-08-29 ENCOUNTER — Telehealth: Payer: Self-pay

## 2018-08-29 NOTE — Telephone Encounter (Signed)
I spoke with Angel Frazier and he request we text him the Zoom information and he will try to download it. He will call us Monday and let us know if it works.

## 2018-08-29 NOTE — Telephone Encounter (Signed)
We have texted him the ZOOM information to download for his appointment.

## 2018-09-01 NOTE — Telephone Encounter (Signed)
Angel Frazier was able to [employer] and we will try it tomorrow at his appointment time.

## 2018-09-02 ENCOUNTER — Other Ambulatory Visit: Payer: Self-pay

## 2018-09-02 ENCOUNTER — Telehealth: Payer: Medicare Other | Admitting: Internal Medicine

## 2018-09-02 DIAGNOSIS — K5150 Left sided colitis without complications: Secondary | ICD-10-CM

## 2018-09-02 DIAGNOSIS — Z79899 Other long term (current) drug therapy: Secondary | ICD-10-CM

## 2018-09-02 DIAGNOSIS — Z7960 Long term (current) use of unspecified immunomodulators and immunosuppressants: Secondary | ICD-10-CM

## 2018-09-02 DIAGNOSIS — Z7952 Long term (current) use of systemic steroids: Secondary | ICD-10-CM

## 2018-09-02 NOTE — Patient Instructions (Signed)
Glad you are doing ok Angel Frazier.  As we discussed - take prednisone 5 mg every other day for 7 doses total and STOP.  Start Humira as soon as you can link up with the nurse from the program.  Follow social isolation and hand hygiene during the Coronaviruse pandemic.  Call us in May to set up an appointment for June.  Call sooner if you need [place] at all.  Gatha Mayer, MD, [facility]

## 2018-09-02 NOTE — Assessment & Plan Note (Signed)
He is to start Humira when he can Taper prednisone 5 mg qod x 7 doses then stop Stay on Lialda and hopefully come off later this year Contact me in May to set up a 2 month f/u Covid-19 precautions reviewed

## 2018-09-02 NOTE — Progress Notes (Signed)
TELEHEALTH ENCOUNTER IN SETTING OF COVID-19 PANDEMIC - REQUESTED BY PATIENT SERVICE PROVIDED BY TELEMEDECINE - TYPE: Zoom AV link PATIENT LOCATION: Home PATIENT HAS CONSENTED TO TELEHEALTH VISIT PROVIDER LOCATION: OFFICE TIME SPENT ON CALL:15    Angel Frazier 63 y.o. August 13, 1955 [id number]  Assessment & Plan:   LEFT SIDED ULCERATIVE COLITIS He is to start Humira when he can Taper prednisone 5 mg qod x 7 doses then stop Stay on [place] and hopefully come off later this year Contact me in May to set up a 2 month f/u Covid-19 precautions reviewed  Long term current use of systemic steroids Tapering off  Long-term use of immunosuppressant medication Humira To start Humira soon     Subjective:   Chief Complaint: Left UC  HPI Angel Frazier rpeorts via [employer] that he is ok - no diarrhea, abdominal pain or rectal bleeding. Has had multiple flares requiring prednisone in past couple of years - tried 6 MP but had hypersensitivity reaction. Humira was obtained through financial assistance and he has it but has not started. Is waiting to link up with the nurse for instructions. Is not otherwise ill at this time. Remains on Lialda and is on 5 mg prednisone. Allergies  Allergen Reactions  . Mercaptopurine Other (See Comments)    Caused Pancreatitis   Current Meds  Medication Sig  . ACCU-CHEK AVIVA PLUS test strip TEST 4 TIMES A DAY AS DIRECTED BY PRESCIBER  . ACCU-CHEK SOFTCLIX LANCETS lancets TEST 4 TIMES A DAY  . Ascorbic Acid (VITAMIN C) 1000 MG tablet Take 1,000 mg by mouth daily. Reported on 09/28/2015  . aspirin 81 MG tablet Take 81 mg by mouth daily. Reported on 09/28/2015  . atorvastatin (LIPITOR) 20 MG tablet Take 20 mg by mouth once a week.  . blood glucose meter kit and supplies Dispense based on patient and insurance preference. Use up to four times daily as directed. (FOR ICD-9 250.00, 250.01).  . Calcium Carbonate-Vitamin D (CALCIUM-VITAMIN D3 PO) Take 1 tablet by mouth  daily. Reported on 09/28/2015  . Cholecalciferol (VITAMIN D) 2000 units CAPS Take 1 capsule by mouth daily.   Past Medical History:  Diagnosis Date  .  vitamin D deficiency 01/24/2013   01/23/13 - level 29 - supplements 50k IU weekly x 6 recommended   . Anabolic steroid abuse   . Avascular necrosis of femoral head (HCC)    bilateral  . Bleeding hemorrhoids   . Colitis 08/06/2013   Mildly actibr colitis  . Diabetes mellitus without complication ([place]) 1157   type 2  . Drug-induced acute pancreatitis - 6MP 09/07/2015  . Erectile dysfunction   . Hypertension   . Left sided ulcerative colitis ([place])   . Spinal stenosis of lumbar region at multiple levels    Past Surgical History:  Procedure Laterality Date  . COLONOSCOPY     multiple  . KNEE ARTHROSCOPY Right   . SIGMOIDOSCOPY     Social History   Social History Narrative   Single never married. 1 son 40 in 2018. 1 grandson 29 in 2018. 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   Review of Systems As above blood sugars "  good"

## 2018-09-02 NOTE — Assessment & Plan Note (Signed)
To start Humira soon

## 2018-09-02 NOTE — Assessment & Plan Note (Signed)
Tapering off

## 2018-09-06 ENCOUNTER — Other Ambulatory Visit: Payer: Self-pay | Admitting: Family Medicine

## 2018-09-15 ENCOUNTER — Encounter: Payer: Medicare Other | Admitting: Family Medicine

## 2018-09-15 ENCOUNTER — Telehealth: Payer: Self-pay | Admitting: Family Medicine

## 2018-09-15 NOTE — Telephone Encounter (Signed)
Copied from [place] [id number]. Topic: Appointment Scheduling - Scheduling Inquiry for Clinic >> Sep 10, 2018  9:35 AM Quentin Mulling wrote: LVM for patient to r/s CPE for about 3-4 months out >> Sep 12, 2018 10:37 AM Margot Ables wrote: Pt called to [facility] appt 4/13 as requested

## 2018-09-17 LAB — HM DIABETES EYE EXAM

## 2018-11-04 ENCOUNTER — Telehealth: Payer: Self-pay | Admitting: Internal Medicine

## 2018-11-04 NOTE — Telephone Encounter (Signed)
Pt called and wanted to inform the nurse that his colitis has flared up again.

## 2018-11-04 NOTE — Telephone Encounter (Signed)
Left message for patient to call back  

## 2018-11-05 NOTE — Telephone Encounter (Signed)
Left message for patient to call back  

## 2018-11-05 NOTE — Telephone Encounter (Signed)
Patient reports that he is having diarrhea about 5-6 times a day.  No bleeding.  No sick contacts or recent travel.  Started on Humira approximately a month ago.  No prednisone at this time ( he completed taper).  Please advise

## 2018-11-06 MED ORDER — PREDNISONE 5 MG PO TABS: ORAL_TABLET | ORAL | 0 refills | Status: AC

## 2018-11-06 NOTE — Telephone Encounter (Signed)
2 weeks of runny stools No bleeding  Will rx prednisone taper  I have asked him to call and make an appointment for about 1 month from now

## 2018-12-04 ENCOUNTER — Other Ambulatory Visit: Payer: Self-pay

## 2018-12-04 ENCOUNTER — Encounter: Payer: Self-pay | Admitting: Family Medicine

## 2018-12-04 ENCOUNTER — Ambulatory Visit (INDEPENDENT_AMBULATORY_CARE_PROVIDER_SITE_OTHER): Payer: Medicare Other | Admitting: Family Medicine

## 2018-12-04 VITALS — BP 138/94 | HR 68 | Temp 97.8°F | Ht 70.5 in | Wt 248.6 lb

## 2018-12-04 DIAGNOSIS — E785 Hyperlipidemia, unspecified: Secondary | ICD-10-CM

## 2018-12-04 DIAGNOSIS — M87059 Idiopathic aseptic necrosis of unspecified femur: Secondary | ICD-10-CM

## 2018-12-04 DIAGNOSIS — Z0000 Encounter for general adult medical examination without abnormal findings: Secondary | ICD-10-CM

## 2018-12-04 DIAGNOSIS — Z1211 Encounter for screening for malignant neoplasm of colon: Secondary | ICD-10-CM

## 2018-12-04 DIAGNOSIS — Z125 Encounter for screening for malignant neoplasm of prostate: Secondary | ICD-10-CM | POA: Diagnosis not present

## 2018-12-04 DIAGNOSIS — D899 Disorder involving the immune mechanism, unspecified: Secondary | ICD-10-CM

## 2018-12-04 DIAGNOSIS — E1169 Type 2 diabetes mellitus with other specified complication: Secondary | ICD-10-CM | POA: Diagnosis not present

## 2018-12-04 DIAGNOSIS — E559 Vitamin D deficiency, unspecified: Secondary | ICD-10-CM | POA: Diagnosis not present

## 2018-12-04 DIAGNOSIS — D849 Immunodeficiency, unspecified: Secondary | ICD-10-CM

## 2018-12-04 DIAGNOSIS — E119 Type 2 diabetes mellitus without complications: Secondary | ICD-10-CM | POA: Diagnosis not present

## 2018-12-04 DIAGNOSIS — K5150 Left sided colitis without complications: Secondary | ICD-10-CM

## 2018-12-04 LAB — LIPID PANEL
Cholesterol: 177 mg/dL (ref 0–200)
HDL: 84.1 mg/dL (ref >39.00)
LDL Cholesterol: 81 mg/dL (ref 0–99)
NonHDL: 92.93
Total CHOL/HDL Ratio: 2
Triglycerides: 58 mg/dL (ref 0.0–149.0)
VLDL: 11.6 mg/dL (ref 0.0–40.0)

## 2018-12-04 LAB — CBC
HCT: 40.4 % (ref 39.0–52.0)
Hemoglobin: 13.2 g/dL (ref 13.0–17.0)
MCHC: 32.7 g/dL (ref 30.0–36.0)
MCV: 97.4 fl (ref 78.0–100.0)
Platelets: 174 10*3/uL (ref 150.0–400.0)
RBC: 4.14 Mil/uL — ABNORMAL LOW (ref 4.22–5.81)
RDW: 14.2 % (ref 11.5–15.5)
WBC: 4.4 10*3/uL (ref 4.0–10.5)

## 2018-12-04 LAB — MICROALBUMIN / CREATININE URINE RATIO
Creatinine,U: 326.2 mg/dL
Microalb Creat Ratio: 2.8 mg/g (ref 0.0–30.0)
Microalb, Ur: 9.2 mg/dL — ABNORMAL HIGH (ref 0.0–1.9)

## 2018-12-04 LAB — COMPREHENSIVE METABOLIC PANEL
ALT: 27 U/L (ref 0–53)
AST: 29 U/L (ref 0–37)
Albumin: 4.2 g/dL (ref 3.5–5.2)
Alkaline Phosphatase: 34 U/L — ABNORMAL LOW (ref 39–117)
BUN: 17 mg/dL (ref 6–23)
CO2: 26 mEq/L (ref 19–32)
Calcium: 9.3 mg/dL (ref 8.4–10.5)
Chloride: 106 mEq/L (ref 96–112)
Creatinine, Ser: 0.92 mg/dL (ref 0.40–1.50)
GFR: 100.01 mL/min (ref >60.00)
Glucose, Bld: 105 mg/dL — ABNORMAL HIGH (ref 70–99)
Potassium: 3.8 mEq/L (ref 3.5–5.1)
Sodium: 142 mEq/L (ref 135–145)
Total Bilirubin: 0.6 mg/dL (ref 0.2–1.2)
Total Protein: 7.1 g/dL (ref 6.0–8.3)

## 2018-12-04 LAB — HEMOGLOBIN A1C: Hgb A1c MFr Bld: 6.7 % — ABNORMAL HIGH (ref 4.6–6.5)

## 2018-12-04 LAB — PSA: PSA: 0.9 ng/mL (ref 0.10–4.00)

## 2018-12-04 LAB — VITAMIN D 25 HYDROXY (VIT D DEFICIENCY, FRACTURES): VITD: 53.67 ng/mL (ref 30.00–100.00)

## 2018-12-04 MED ORDER — ATORVASTATIN CALCIUM 20 MG PO TABS: 20.0000 mg | ORAL_TABLET | ORAL | 3 refills | Status: DC

## 2018-12-04 NOTE — Progress Notes (Addendum)
Phone: [phone]   Subjective:  Patient presents today for their annual physical. Chief complaint-noted.   See problem oriented charting- ROS- full  review of systems was completed and negative except for:  Low back issues- recently has not been too bad  The following were reviewed and entered/updated in epic: Past Medical History:  Diagnosis Date  .  vitamin D deficiency 01/24/2013   01/23/13 - level 29 - supplements 50k IU weekly x 6 recommended   . Anabolic steroid abuse   . Avascular necrosis of femoral head (HCC)    bilateral  . Bleeding hemorrhoids   . Colitis 08/06/2013   Mildly actibr colitis  . Diabetes mellitus without complication ([place]) 5885   type 2  . Drug-induced acute pancreatitis - 6MP 09/07/2015  . Erectile dysfunction   . Hypertension   . Left sided ulcerative colitis ([place])   . Spinal stenosis of lumbar region at multiple levels    Patient Active Problem List   Diagnosis Date Noted  . Controlled type 2 diabetes mellitus ([place]) 08/31/2015    Priority: High  . Long-term use of immunosuppressant medication Humira 07/12/2015    Priority: High  . LEFT SIDED ULCERATIVE COLITIS 08/11/2008    Priority: High  . Pulmonary nodules 09/07/2015    Priority: Medium  . Long term current use of systemic steroids 11/18/2014    Priority: Low  . Avascular necrosis of femoral heads 11/18/2014    Priority: Low  . Vitamin D deficiency 01/24/2013    Priority: Low  . ED (erectile dysfunction) 05/21/2012    Priority: Low   Past Surgical History:  Procedure Laterality Date  . COLONOSCOPY     multiple  . KNEE ARTHROSCOPY Right   . SIGMOIDOSCOPY      Family History  Problem Relation Age of Onset  . Diabetes Mother   . Diabetes Other        cousin  . Hypertension Other   . Diabetes type I Son   . Colon cancer Neg Hx   . Esophageal cancer Neg Hx   . Rectal cancer Neg Hx   . Stomach cancer Neg Hx     Medications- reviewed and updated Current Outpatient  Medications  Medication Sig Dispense Refill  . ACCU-CHEK AVIVA PLUS test strip TEST 4 TIMES A DAY AS DIRECTED BY PRESCIBER 350 each 0  . ACCU-CHEK SOFTCLIX LANCETS lancets TEST 4 TIMES A DAY 300 each 0  . Adalimumab (HUMIRA PEN) 40 MG/0.4ML PNKT Inject 40 mg into the skin every 14 (fourteen) days. 2 each 6  . Ascorbic Acid (VITAMIN C) 1000 MG tablet Take 1,000 mg by mouth daily. Reported on 09/28/2015    . aspirin 81 MG tablet Take 81 mg by mouth daily. Reported on 09/28/2015    . atorvastatin (LIPITOR) 20 MG tablet Take 1 tablet (20 mg total) by mouth once a week. 90 tablet 3  . blood glucose meter kit and supplies Dispense based on patient and insurance preference. Use up to four times daily as directed. (FOR ICD-9 250.00, 250.01). 1 each 0  . Calcium Carbonate-Vitamin D (CALCIUM-VITAMIN D3 PO) Take 1 tablet by mouth daily. Reported on 09/28/2015    . Cholecalciferol (VITAMIN D) 2000 units CAPS Take 1 capsule by mouth daily.    . mesalamine (LIALDA) 1.2 g EC tablet Take 2 tablets (2.4 g total) by mouth 2 (two) times daily. 120 tablet 11  . METHOCARBAMOL PO Take 500 mg by mouth daily as needed (for muscle spasms).     Marland Kitchen  mometasone (ELOCON) 0.1 % cream Apply 1 application topically daily. 45 g 0  . Multiple Vitamins-Minerals (CENTRUM SILVER PO) Take 1 tablet by mouth daily. Reported on 09/28/2015    . Omega-3 Fatty Acids (FISH OIL) 300 MG CAPS Take 300 mg by mouth daily.     . pioglitazone-metformin (ACTOPLUS MET) 15-850 MG tablet Take 1 tablet by mouth daily. 90 tablet 3  . predniSONE (DELTASONE) 5 MG tablet Take 6 tablets (30 mg total) by mouth daily with breakfast for 4 days, THEN 5 tablets (25 mg total) daily with breakfast for 4 days, THEN 4 tablets (20 mg total) daily with breakfast for 4 days, THEN 3 tablets (15 mg total) daily with breakfast for 4 days, THEN 2 tablets (10 mg total) daily with breakfast for 7 days, THEN 1 tablet (5 mg total) daily with breakfast for 7 days. 93 tablet 0  .  vitamin B-12 (CYANOCOBALAMIN) 1000 MCG tablet Take 2,500 mcg by mouth daily. Reported on 09/28/2015     No current facility-administered medications for this visit.     Allergies-reviewed and updated Allergies  Allergen Reactions  . Mercaptopurine Other (See Comments)    Caused Pancreatitis    Social History   Social History Narrative   Single never married. 1 son 74 in 2018. 1 grandson 59 in 2018. 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 Oropharynx largely normal Neck: no thyromegaly CV: RRR no murmurs rubs or gallops Lungs: CTAB no crackles, wheeze, rhonchi Abdomen: soft/nontender/nondistended/normal bowel sounds. No rebound or guarding. Small fat containing umbilical hernia Ext: no edema Skin: warm, dry Neuro: grossly normal, moves all extremities, PERRLA   Assessment and Plan  63 y.o. male presenting for annual physical.  Health Maintenance counseling: 1. Anticipatory guidance: Patient counseled regarding regular dental exams -q6 months, eye exams - advised yearly as has missed this,  avoiding smoking and second hand smoke , limiting alcohol to 2 beverages per day - mainly weekends- thinks under 14.   2. Risk factor reduction:  Advised patient of need for regular exercise and diet rich and fruits and vegetables to reduce risk of heart attack and stroke. Exercise- doing some yardwork- encouraged regular exercise- he prefers to be at the gym. Diet-trying to eat a reasonably healthy diet- discussed weight loss could be helpful.  Weight only up 1 pound from last physical but lost 15 lbs over last 6 months Wt Readings from Last 3  Encounters:  12/04/18 248 lb 9.6 oz (112.8 kg)  06/23/18 263 lb 12.8 oz (119.7 kg)  05/23/18 256 lb 8 oz (116.3 kg)  3. Immunizations/screenings/ancillary studies-defer Shingrix due to COVID-19  Immunization History  Administered Date(s) Administered  . Influenza Split 02/15/2011  . Influenza, Seasonal, Injecte, Preservative Fre 05/21/2012  . Influenza,inj,Quad PF,6+ Mos 06/03/2013  . Td 06/04/2001  . Tdap 05/21/2012  4. Prostate cancer screening- low risk rectal exam in the past-get PSA alone-nocturia once a night Lab Results  Component Value Date   PSA 1.17 09/11/2017  PSA 0.98 09/07/2016   PSA 0.90 07/20/2014   5. Colon cancer screening - March 2015 with five-year follow-up interval noted- also likely related to UC- referred back today 6. Skin cancer screening-low risk due to melanin levels. advised regular sunscreen use. Denies worrisome, changing, or new skin lesions.  7. former smoker- quit 2002- will get ua  Status of chronic or acute concerns  DM - Taking Actosplus Met 15-850 mg daily. Followed by Dr. Loanne Drilling. Repeat a1c today as has been 6 months . Hopefully stable Lab Results  Component Value Date   HGBA1C 6.0 (A) 06/23/2018   Vit D Deficiency - Taking OTC supplement daily. Update viatmain d  Ulcerative Colitis/immunosuppressed - Taking Lialda 1.2 g 2 tablets BID and Humira. Managed by Dr. Carlean Purl. Stable- continue follow up with Dr. Carlean Purl- referred back today for colonoscopy  Pulmonary Nodule - Managed by Dr. Melvyn Novas. Last CT 04/29/2017. dont see that he has seen Dr. Melvyn Novas- reviewed ct and states 2 year follow up so he will be due in November- if hasnt had by next visit we can set this up  Hyperlipidemia- controlled last year with LDL 65- update today on atorvastatin 20 mg once a week on sundays  Avascular necrosis on prior imaging- Dr. Carlean Purl is aware and being cautious with steroids  Elevated blood pressure- suggested adding amlodipine- occasional swelling in legs.  He wants to hold off on medicine for now. He thinks prednisone is contributing BP Readings from Last 3 Encounters:  12/04/18 (!) 138/94  06/23/18 140/88  05/29/18 (!) 146/96  from avs "Your blood pressure trend concerns me. I would like for you to use a home cuff to check at least 4x a week. Your goal is <140/90 on average. If you note in the next few weeks that it is higher than our goal, see me sooner. Otherwise, see me in 4-6 weeks. Bring your home cuff and your log of blood pressures with you to visit. We are strongly considering using a medicine called amlodipine 2.38m to lower blood pressure at least some. "  4-6 week follow up Future Appointments  Date Time Provider [place] 12/22/2018  8:45 AM ERenato Shin MD [id number] None   Lab/Order associations:fasting    ICD-10-CM   1. Preventative health care  Z00.00 Ambulatory referral to Gastroenterology    atorvastatin (LIPITOR) 20 MG tablet    CBC    Comprehensive metabolic panel    Lipid panel    PSA    Hemoglobin A1c    Microalbumin / creatinine urine ratio    VITAMIN D 25 Hydroxy (Vit-D Deficiency, Fractures)    Urinalysis  2. Controlled type 2 diabetes mellitus without complication, without long-term current use of insulin (HCC)  E11.9 CBC    Comprehensive metabolic panel    Lipid panel    Hemoglobin A1c    Microalbumin / creatinine urine ratio    Urinalysis  3. Hyperlipidemia associated with type 2 diabetes mellitus ([place]  E11.69    E78.5   4. Avascular necrosis of femoral head, unspecified laterality (HCC) Chronic M87.059   5. Immunosuppressed status ([place]  D89.9   6. Vitamin D deficiency  E55.9 VITAMIN D 25 Hydroxy (Vit-D Deficiency, Fractures)  7. Screening for prostate cancer  Z12.5 PSA  8. LEFT SIDED ULCERATIVE COLITIS  K51.50 Ambulatory referral to Gastroenterology  9. Screening for colon cancer  Z12.11 Ambulatory referral to Gastroenterology    Meds ordered this encounter  Medications  .  atorvastatin (LIPITOR) 20 MG tablet  Sig: Take 1 tablet (20 mg total) by mouth once a week.    Dispense:  90 tablet    Refill:  3   Return precautions advised.  [name], MD

## 2018-12-04 NOTE — Patient Instructions (Addendum)
Health Maintenance Due  Topic Date Due  . OPHTHALMOLOGY EXAM - Please get this scheduled and have them send [place] a copy. Diabetic yearly eye exams are very important 11/02/2017  . COLONOSCOPY Order placed as Dr. Carlean Purl wanted to see you back 08/07/2018  . URINE MICROALBUMIN Order placed 09/12/2018   Please stop by lab before you go If you do not have mychart- we will call you about results within 5 business days of [place] receiving them.  If you have mychart- we will send your results within 3 business days of [place] receiving them.  If abnormal or we want to clarify a result, we will call or mychart you to make sure you receive the message.  If you have questions or concerns or don't hear within 5-7 days, please send [place] a message or call us.   Your blood pressure trend concerns me. I would like for you to use a home cuff to check at least 4x a week at least- daily is even better. Your goal is <140/90 on average. If you note in the next few weeks that it is higher than our goal, see me sooner. Otherwise, see me in 4-6 weeks. Bring your home cuff and your log of blood pressures with you to visit. We are strongly considering using a medicine called amlodipine 2.66m to lower blood pressure at least some.

## 2018-12-08 ENCOUNTER — Telehealth: Payer: Self-pay | Admitting: Internal Medicine

## 2018-12-08 ENCOUNTER — Telehealth: Payer: Self-pay | Admitting: Family Medicine

## 2018-12-08 NOTE — Telephone Encounter (Signed)
Spoke with patient, lab results given.

## 2018-12-08 NOTE — Telephone Encounter (Signed)
Patient is using a assistance program for both drugs. He is advised that he will need to contact the assistance program to have them send his refills.

## 2018-12-08 NOTE — Telephone Encounter (Signed)
There is a referral for pt to have a colonoscopy, but he had a Flex Sig in 2019.  Please advise scheduling.

## 2018-12-08 NOTE — Telephone Encounter (Signed)
Pt is calling back and would like lab results Copied from [place] [id number] [id number]. Topic: Quick Communication - Lab Results (Clinic Use ONLY)  >> Dec 08, 2018 10:28 AM Franco Collet, CMA wrote: Called patient to inform them of  lab results. When patient returns call, triage nurse may disclose results.

## 2018-12-08 NOTE — Telephone Encounter (Signed)
Dr. Carlean Purl please review

## 2018-12-08 NOTE — Telephone Encounter (Signed)
He will need one but want to give the Humira time to work so let's place a recall for [employer]. Would be good for him to have an OV (telehealth ok) in august

## 2018-12-09 NOTE — Telephone Encounter (Signed)
Virtual visit scheduled on 01/08/19.

## 2018-12-17 DIAGNOSIS — E119 Type 2 diabetes mellitus without complications: Secondary | ICD-10-CM | POA: Diagnosis not present

## 2018-12-17 LAB — HM DIABETES EYE EXAM

## 2018-12-18 ENCOUNTER — Other Ambulatory Visit: Payer: Self-pay

## 2018-12-22 ENCOUNTER — Encounter: Payer: Self-pay | Admitting: Endocrinology

## 2018-12-22 ENCOUNTER — Ambulatory Visit (INDEPENDENT_AMBULATORY_CARE_PROVIDER_SITE_OTHER): Payer: Medicare Other | Admitting: Endocrinology

## 2018-12-22 ENCOUNTER — Other Ambulatory Visit: Payer: Self-pay

## 2018-12-22 ENCOUNTER — Telehealth: Payer: Self-pay | Admitting: Endocrinology

## 2018-12-22 DIAGNOSIS — I10 Essential (primary) hypertension: Secondary | ICD-10-CM | POA: Diagnosis not present

## 2018-12-22 DIAGNOSIS — E119 Type 2 diabetes mellitus without complications: Secondary | ICD-10-CM | POA: Insufficient documentation

## 2018-12-22 DIAGNOSIS — E1165 Type 2 diabetes mellitus with hyperglycemia: Secondary | ICD-10-CM | POA: Insufficient documentation

## 2018-12-22 DIAGNOSIS — E109 Type 1 diabetes mellitus without complications: Secondary | ICD-10-CM

## 2018-12-22 DIAGNOSIS — R609 Edema, unspecified: Secondary | ICD-10-CM | POA: Diagnosis not present

## 2018-12-22 MED ORDER — METFORMIN HCL 1000 MG PO TABS: 1000.0000 mg | ORAL_TABLET | Freq: Two times a day (BID) | ORAL | 3 refills | Status: DC

## 2018-12-22 NOTE — Progress Notes (Signed)
Subjective:    Patient ID: Angel Frazier, male    DOB: 06/04/1956, 63 y.o.   MRN: [id number]  HPI Pt returns for f/u of diabetes mellitus: DM type: 1, in remission (AKA atypical DM, intermittently ketosis-prone DM, etc).   Dx'ed: 1224 Complications: none Therapy: 2 oral meds.  DKA: once, at dx Severe hypoglycemia: never. Pancreatitis: never Pancreatic imaging: normal on 2017 CT Other: he has been in remission 2017-2020. He took insulin for a brief time after dx; edema limits rx options.  Interval history: He brings hios meter, with his cbg's which I have reviewed today.  cbg's are in the low-100's.  pt states he feels well in general.   norvasc is being considered for HTN.   Past Medical History:  Diagnosis Date  .  vitamin D deficiency 01/24/2013   01/23/13 - level 29 - supplements 50k IU weekly x 6 recommended   . Anabolic steroid abuse   . Avascular necrosis of femoral head (HCC)    bilateral  . Bleeding hemorrhoids   . Colitis 08/06/2013   Mildly actibr colitis  . Diabetes mellitus without complication ([place]) 8250   type 2  . Drug-induced acute pancreatitis - 6MP 09/07/2015  . Erectile dysfunction   . Hypertension   . Left sided ulcerative colitis ([place])   . Spinal stenosis of lumbar region at multiple levels     Past Surgical History:  Procedure Laterality Date  . COLONOSCOPY     multiple  . KNEE ARTHROSCOPY Right   . SIGMOIDOSCOPY      Social History   Socioeconomic History  . Marital status: Single    Spouse name: Not on file  . Number of children: 1  . Years of education: Not on file  . Highest education level: Not on file  Occupational History  . Occupation: retired    Fish farm manager: [employer]  . Financial resource strain: Not on file  . Food insecurity    Worry: Not on file    Inability: Not on file  . Transportation needs    Medical: Not on file    Non-medical: Not on file  Tobacco Use  . Smoking status: Former Smoker   Packs/day: 1.00    Years: 6.00    Pack years: 6.00    Types: Cigarettes    Quit date: 06/04/2000    Years since quitting: 18.5  . Smokeless tobacco: Never Used  Substance and Sexual Activity  . Alcohol use: Yes    Comment: occasionally  . Drug use: No  . Sexual activity: Not on file  Lifestyle  . Physical activity    Days per week: Not on file    Minutes per session: Not on file  . Stress: Not on file  Relationships  . Social Herbalist on phone: Not on file    Gets together: Not on file    Attends religious service: Not on file    Active member of club or organization: Not on file    Attends meetings of clubs or organizations: Not on file    Relationship status: Not on file  . Intimate partner violence    Fear of current or ex partner: Not on file    Emotionally abused: Not on file    Physically abused: Not on file    Forced sexual activity: Not on file  Other Topics Concern  . Not on file  Social History Narrative   Single never married.  1 son 13 in 2018. 1 grandson 21 in 2018. 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 ACCU-CHEK AVIVA PLUS test strip TEST 4 TIMES A DAY AS DIRECTED BY PRESCIBER 350 each 0  . ACCU-CHEK SOFTCLIX LANCETS lancets TEST 4 TIMES A DAY 300 each 0  . Adalimumab (HUMIRA PEN) 40 MG/0.4ML PNKT Inject 40 mg into the skin every 14 (fourteen) days. 2 each 6  . Ascorbic Acid (VITAMIN C) 1000 MG tablet Take 1,000 mg by mouth daily. Reported on 09/28/2015    . aspirin 81 MG tablet Take 81 mg by mouth daily. Reported on 09/28/2015    . atorvastatin (LIPITOR) 20 MG tablet Take 1 tablet (20 mg total) by mouth once a week. 90 tablet 3  . blood glucose meter kit and supplies Dispense based on patient and insurance preference. Use up to four  times daily as directed. (FOR ICD-9 250.00, 250.01). 1 each 0  . Calcium Carbonate-Vitamin D (CALCIUM-VITAMIN D3 PO) Take 1 tablet by mouth daily. Reported on 09/28/2015    . Cholecalciferol (VITAMIN D) 2000 units CAPS Take 1 capsule by mouth daily.    . mesalamine (LIALDA) 1.2 g EC tablet Take 2 tablets (2.4 g total) by mouth 2 (two) times daily. 120 tablet 11  . METHOCARBAMOL PO Take 500 mg by mouth daily as needed (for muscle spasms).     . mometasone (ELOCON) 0.1 % cream Apply 1 application topically daily. 45 g 0  . Multiple Vitamins-Minerals (CENTRUM SILVER PO) Take 1 tablet by mouth daily. Reported on 09/28/2015    . Omega-3 Fatty Acids (FISH OIL) 300 MG CAPS Take 300 mg by mouth daily.     . vitamin B-12 (CYANOCOBALAMIN) 1000 MCG tablet Take 2,500 mcg by mouth daily. Reported on 09/28/2015     No current facility-administered medications on file prior to visit.     Allergies  Allergen Reactions  . Mercaptopurine Other (See Comments)    Caused Pancreatitis    Family History  Problem Relation Age of Onset  . Diabetes Mother   . Diabetes Other        cousin  . Hypertension Other   . Diabetes type I Son   . Colon cancer Neg Hx   . Esophageal cancer Neg Hx   . Rectal cancer Neg Hx   . Stomach cancer Neg Hx     BP (!) 154/84 (BP Location: Left Arm, Patient Position: Sitting, Cuff Size: Large)   Pulse 72   Ht 5' 10.5" (1.791 m)   Wt 254 lb 9.6 oz (115.5 kg)   SpO2 95%   BMI 36.01 kg/m   Review of Systems Denies sob    Objective:   Physical Exam VITAL SIGNS:  See vs page GENERAL: no distress Pulses: dorsalis pedis intact bilat.   MSK: no deformity of the feet CV: trace bilat leg edema.  Skin:  no ulcer on the feet.  normal color and temp on the feet. Neuro: sensation is intact to touch on the feet  Lab Results  Component Value Date   CREATININE 0.92 12/04/2018   BUN 17 12/04/2018   NA 142 12/04/2018   K 3.8 12/04/2018   CL 106 12/04/2018   CO2 26  12/04/2018      Lab Results  Component Value Date   HGBA1C 6.7 (H) 12/04/2018        Assessment & Plan:  HTN: is noted today Type 1 DM, in remission Edema: we'll d/c pioglitazone, to permit more consideration of amlodipine.   Patient Instructions  Your blood pressure is high today.  Please see your primary care provider soon, to have it rechecked I have sent a prescription to your pharmacy, to change the pioglitazone-metformin, to a higher amount of metformin only. The pioglitazone is a very slow-acting pill, so any swelling it causes may take a few months to go away.   check your blood sugar once a day.  vary the time of day when you check, between before the 3 meals, and at bedtime.  also check if you have symptoms of your blood sugar being too high or too low.  please keep a record of the readings and bring it to your next appointment here (or you can bring the meter itself).  You can write it on any piece of paper.  please call us sooner if your blood sugar goes below 70, or if you have a lot of readings over 200.   Please come back for a follow-up appointment in 2 months.

## 2018-12-22 NOTE — Telephone Encounter (Signed)
Appt on paper show different time that I told him so I update him via vm to correct time

## 2018-12-22 NOTE — Patient Instructions (Addendum)
Your blood pressure is high today.  Please see your primary care provider soon, to have it rechecked I have sent a prescription to your pharmacy, to change the pioglitazone-metformin, to a higher amount of metformin only. The pioglitazone is a very slow-acting pill, so any swelling it causes may take a few months to go away.   check your blood sugar once a day.  vary the time of day when you check, between before the 3 meals, and at bedtime.  also check if you have symptoms of your blood sugar being too high or too low.  please keep a record of the readings and bring it to your next appointment here (or you can bring the meter itself).  You can write it on any piece of paper.  please call us sooner if your blood sugar goes below 70, or if you have a lot of readings over 200.   Please come back for a follow-up appointment in 2 months.

## 2018-12-25 ENCOUNTER — Encounter (HOSPITAL_COMMUNITY): Payer: Self-pay | Admitting: Emergency Medicine

## 2018-12-25 ENCOUNTER — Inpatient Hospital Stay (HOSPITAL_COMMUNITY)
Admission: EM | Admit: 2018-12-25 | Discharge: 2018-12-26 | DRG: 683 | Disposition: A | Discharge status: Home / Self Care | Payer: Medicare Other | Attending: Internal Medicine | Admitting: Internal Medicine

## 2018-12-25 DIAGNOSIS — E860 Dehydration: Secondary | ICD-10-CM | POA: Diagnosis present

## 2018-12-25 DIAGNOSIS — M6282 Rhabdomyolysis: Secondary | ICD-10-CM

## 2018-12-25 DIAGNOSIS — Z1159 Encounter for screening for other viral diseases: Secondary | ICD-10-CM

## 2018-12-25 DIAGNOSIS — R55 Syncope and collapse: Secondary | ICD-10-CM | POA: Diagnosis not present

## 2018-12-25 DIAGNOSIS — Z833 Family history of diabetes mellitus: Secondary | ICD-10-CM | POA: Diagnosis not present

## 2018-12-25 DIAGNOSIS — E876 Hypokalemia: Secondary | ICD-10-CM | POA: Diagnosis present

## 2018-12-25 DIAGNOSIS — R61 Generalized hyperhidrosis: Secondary | ICD-10-CM | POA: Diagnosis not present

## 2018-12-25 DIAGNOSIS — I959 Hypotension, unspecified: Secondary | ICD-10-CM | POA: Diagnosis present

## 2018-12-25 DIAGNOSIS — N179 Acute kidney failure, unspecified: Principal | ICD-10-CM

## 2018-12-25 DIAGNOSIS — R531 Weakness: Secondary | ICD-10-CM | POA: Diagnosis not present

## 2018-12-25 DIAGNOSIS — E109 Type 1 diabetes mellitus without complications: Secondary | ICD-10-CM | POA: Diagnosis not present

## 2018-12-25 DIAGNOSIS — E1165 Type 2 diabetes mellitus with hyperglycemia: Secondary | ICD-10-CM

## 2018-12-25 DIAGNOSIS — Z87891 Personal history of nicotine dependence: Secondary | ICD-10-CM

## 2018-12-25 DIAGNOSIS — K5150 Left sided colitis without complications: Secondary | ICD-10-CM | POA: Diagnosis not present

## 2018-12-25 DIAGNOSIS — X30XXXA Exposure to excessive natural heat, initial encounter: Secondary | ICD-10-CM

## 2018-12-25 DIAGNOSIS — I10 Essential (primary) hypertension: Secondary | ICD-10-CM | POA: Diagnosis present

## 2018-12-25 DIAGNOSIS — K5100 Ulcerative (chronic) pancolitis without complications: Secondary | ICD-10-CM | POA: Diagnosis present

## 2018-12-25 DIAGNOSIS — Z7982 Long term (current) use of aspirin: Secondary | ICD-10-CM

## 2018-12-25 DIAGNOSIS — E119 Type 2 diabetes mellitus without complications: Secondary | ICD-10-CM | POA: Diagnosis not present

## 2018-12-25 DIAGNOSIS — Z79899 Other long term (current) drug therapy: Secondary | ICD-10-CM

## 2018-12-25 DIAGNOSIS — Z8249 Family history of ischemic heart disease and other diseases of the circulatory system: Secondary | ICD-10-CM

## 2018-12-25 DIAGNOSIS — T675XXA Heat exhaustion, unspecified, initial encounter: Secondary | ICD-10-CM | POA: Diagnosis present

## 2018-12-25 DIAGNOSIS — E785 Hyperlipidemia, unspecified: Secondary | ICD-10-CM | POA: Diagnosis not present

## 2018-12-25 DIAGNOSIS — R0902 Hypoxemia: Secondary | ICD-10-CM | POA: Diagnosis not present

## 2018-12-25 HISTORY — DX: Rhabdomyolysis: M62.82

## 2018-12-25 LAB — URINALYSIS, ROUTINE W REFLEX MICROSCOPIC
Bilirubin Urine: NEGATIVE
Glucose, UA: NEGATIVE mg/dL
Hgb urine dipstick: NEGATIVE
Ketones, ur: 5 mg/dL — AB
Leukocytes,Ua: NEGATIVE
Nitrite: NEGATIVE
Protein, ur: NEGATIVE mg/dL
Specific Gravity, Urine: 1.018 (ref 1.005–1.030)
pH: 5 (ref 5.0–8.0)

## 2018-12-25 LAB — COMPREHENSIVE METABOLIC PANEL
ALT: 31 U/L (ref 0–44)
AST: 50 U/L — ABNORMAL HIGH (ref 15–41)
Albumin: 4.5 g/dL (ref 3.5–5.0)
Alkaline Phosphatase: 35 U/L — ABNORMAL LOW (ref 38–126)
Anion gap: 17 — ABNORMAL HIGH (ref 5–15)
BUN: 24 mg/dL — ABNORMAL HIGH (ref 8–23)
CO2: 21 mmol/L — ABNORMAL LOW (ref 22–32)
Calcium: 10.4 mg/dL — ABNORMAL HIGH (ref 8.9–10.3)
Chloride: 102 mmol/L (ref 98–111)
Creatinine, Ser: 2.49 mg/dL — ABNORMAL HIGH (ref 0.61–1.24)
GFR calc Af Amer: 30 mL/min — ABNORMAL LOW (ref >60)
GFR calc non Af Amer: 26 mL/min — ABNORMAL LOW (ref >60)
Glucose, Bld: 93 mg/dL (ref 70–99)
Potassium: 3.7 mmol/L (ref 3.5–5.1)
Sodium: 140 mmol/L (ref 135–145)
Total Bilirubin: 2.1 mg/dL — ABNORMAL HIGH (ref 0.3–1.2)
Total Protein: 8.2 g/dL — ABNORMAL HIGH (ref 6.5–8.1)

## 2018-12-25 LAB — CBC WITH DIFFERENTIAL/PLATELET
Abs Immature Granulocytes: 0.04 10*3/uL (ref 0.00–0.07)
Basophils Absolute: 0 10*3/uL (ref 0.0–0.1)
Basophils Relative: 0 %
Eosinophils Absolute: 0.1 10*3/uL (ref 0.0–0.5)
Eosinophils Relative: 1 %
HCT: 45.7 % (ref 39.0–52.0)
Hemoglobin: 15.1 g/dL (ref 13.0–17.0)
Immature Granulocytes: 1 %
Lymphocytes Relative: 23 %
Lymphs Abs: 1.6 10*3/uL (ref 0.7–4.0)
MCH: 32.1 pg (ref 26.0–34.0)
MCHC: 33 g/dL (ref 30.0–36.0)
MCV: 97.2 fL (ref 80.0–100.0)
Monocytes Absolute: 0.9 10*3/uL (ref 0.1–1.0)
Monocytes Relative: 12 %
Neutro Abs: 4.4 10*3/uL (ref 1.7–7.7)
Neutrophils Relative %: 63 %
Platelets: 177 10*3/uL (ref 150–400)
RBC: 4.7 MIL/uL (ref 4.22–5.81)
RDW: 12.6 % (ref 11.5–15.5)
WBC: 7.1 10*3/uL (ref 4.0–10.5)
nRBC: 0 % (ref 0.0–0.2)

## 2018-12-25 LAB — SARS CORONAVIRUS 2 BY RT PCR (HOSPITAL ORDER, PERFORMED IN ~~LOC~~ HOSPITAL LAB): SARS Coronavirus 2: NEGATIVE

## 2018-12-25 LAB — CK: Total CK: 2559 U/L — ABNORMAL HIGH (ref 49–397)

## 2018-12-25 LAB — TROPONIN I (HIGH SENSITIVITY): Troponin I (High Sensitivity): 10 ng/L (ref <18)

## 2018-12-25 MED ORDER — OMEGA-3-ACID ETHYL ESTERS 1 G PO CAPS
1.0000 g | ORAL_CAPSULE | Freq: Two times a day (BID) | ORAL | Status: DC
  Administered 2018-12-25 – 2018-12-26 (×2): 1 g via ORAL
  Filled 2018-12-25 (×2): qty 1

## 2018-12-25 MED ORDER — SODIUM CHLORIDE 0.9 % IV BOLUS
500.0000 mL | Freq: Once | INTRAVENOUS | Status: AC
  Administered 2018-12-25: 500 mL via INTRAVENOUS

## 2018-12-25 MED ORDER — HEPARIN SODIUM (PORCINE) 5000 UNIT/ML IJ SOLN
5000.0000 [IU] | Freq: Three times a day (TID) | INTRAMUSCULAR | Status: DC
  Administered 2018-12-25 – 2018-12-26 (×4): 5000 [IU] via SUBCUTANEOUS
  Filled 2018-12-25 (×4): qty 1

## 2018-12-25 MED ORDER — SODIUM CHLORIDE 0.9 % IV SOLN
INTRAVENOUS | Status: DC
  Administered 2018-12-25 – 2018-12-26 (×3): via INTRAVENOUS

## 2018-12-25 MED ORDER — MESALAMINE 1.2 G PO TBEC
2.4000 g | DELAYED_RELEASE_TABLET | Freq: Two times a day (BID) | ORAL | Status: DC
  Administered 2018-12-25 – 2018-12-26 (×2): 2.4 g via ORAL
  Filled 2018-12-25 (×3): qty 2

## 2018-12-25 MED ORDER — VITAMIN B-12 1000 MCG PO TABS
2500.0000 ug | ORAL_TABLET | Freq: Every day | ORAL | Status: DC
  Administered 2018-12-25 – 2018-12-26 (×2): 2500 ug via ORAL
  Filled 2018-12-25: qty 3
  Filled 2018-12-25: qty 5

## 2018-12-25 MED ORDER — STERILE WATER FOR INJECTION IV SOLN
INTRAVENOUS | Status: AC
  Administered 2018-12-25: 22:00:00 via INTRAVENOUS
  Filled 2018-12-25: qty 9.71

## 2018-12-25 MED ORDER — SODIUM CHLORIDE 0.9 % IV SOLN: INTRAVENOUS | Status: DC

## 2018-12-25 NOTE — ED Notes (Signed)
Dinner tray ordered.

## 2018-12-25 NOTE — ED Notes (Signed)
Pt. is aware a urine specimen is needed and has been unable to urinate.

## 2018-12-25 NOTE — H&P (Signed)
TRH H&P   Patient Demographics:    Angel Frazier, is a 63 y.o. male  MRN: [id number]   DOB - [birth year]  Admit Date - 12/25/2018  Outpatient Primary MD for the patient is Yong Channel Brayton Mars, MD  Referring MD/NP/PA: PA Geiple  Patient coming from: Home  Chief Complaint  Patient presents with  . Near Syncope  . Hypotension      HPI:    Angel Frazier  is a 63 y.o. 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  - in ED attending was elevated at 2.499, AST mildly elevated at 50, total CK elevated at 2.559, blood pressure responded to fluid bolus, and have requested to admit for AKI/rhabdomyolysis.   Review of systems:    In addition to the HPI above,  No Fever-chills, does report presyncope No Headache, No changes with Vision or hearing, No problems swallowing food or Liquids, No Chest pain, Cough or Shortness of Breath, No Abdominal pain, No Nausea or Vommitting, Bowel movements are regular, No Blood in stool or Urine, No dysuria, No new skin rashes or bruises, No new joints pains-aches,  No new weakness, tingling, numbness in any extremity, reports muscle spasms No recent  weight gain or loss, No polyuria, polydypsia or polyphagia, No significant Mental Stressors.  A full 10 point Review of Systems was done, except as stated above, all other Review of Systems were negative.   With Past History of the following :    Past Medical History:  Diagnosis Date  .  vitamin D deficiency 01/24/2013   01/23/13 - level 29 - supplements 50k IU weekly x 6 recommended   . Anabolic steroid abuse   . Avascular necrosis of femoral head (HCC)    bilateral  . Bleeding hemorrhoids   . Colitis 08/06/2013   Mildly actibr colitis  . Diabetes mellitus without complication ([place]) 7619   type 2  . Drug-induced acute pancreatitis - 6MP 09/07/2015  . Erectile dysfunction   . Hypertension   . Left sided ulcerative colitis ([place])   . Spinal stenosis of lumbar region at multiple levels       Past Surgical History:  Procedure Laterality Date  .  COLONOSCOPY     multiple  . KNEE ARTHROSCOPY Right   . SIGMOIDOSCOPY        Social History:     Social History   Tobacco Use  . Smoking status: Former Smoker    Packs/day: 1.00    Years: 6.00    Pack years: 6.00    Types: Cigarettes    Quit date: 06/04/2000    Years since quitting: 18.5  . Smokeless tobacco: Never Used  Substance Use Topics  . Alcohol use: Yes    Comment: occasionally     Lives -at home  Mobility -independent     Family History :     Family History  Problem Relation Age of Onset  . Diabetes Mother   . Diabetes Other        cousin  . Hypertension Other   . Diabetes type I Son   . Colon cancer Neg Hx   . Esophageal cancer Neg Hx   . Rectal cancer Neg Hx   . Stomach cancer Neg Hx     Home Medications:   Prior to Admission medications   Medication Sig Start Date End Date Taking? Authorizing Provider  Adalimumab (HUMIRA PEN) 40 MG/0.4ML PNKT Inject 40 mg into the skin every 14 (fourteen) days. 06/23/18  Yes Gatha Mayer, MD  Ascorbic Acid (VITAMIN C) 1000 MG tablet Take 1,000 mg by mouth  daily. Reported on 09/28/2015   Yes [provider]  aspirin 81 MG tablet Take 81 mg by mouth daily. Reported on 09/28/2015   Yes [provider]  atorvastatin (LIPITOR) 20 MG tablet Take 1 tablet (20 mg total) by mouth once a week. Patient taking differently: Take 20 mg by mouth every Sunday.  12/04/18  Yes [name], MD  Calcium Carbonate-Vitamin D (CALCIUM-VITAMIN D3 PO) Take 1 tablet by mouth daily. Reported on 09/28/2015   Yes [provider]  Cholecalciferol (VITAMIN D-3 PO) Take 1 capsule by mouth daily with breakfast.   Yes [provider]  mesalamine (LIALDA) 1.2 g EC tablet Take 2 tablets (2.4 g total) by mouth 2 (two) times daily. 12/11/17  Yes Gatha Mayer, MD  metFORMIN (GLUCOPHAGE) 1000 MG tablet Take 1 tablet (1,000 mg total) by mouth 2 (two) times daily with a meal. 12/22/18  Yes Renato Shin, MD  Multiple Vitamins-Minerals (CENTRUM SILVER PO) Take 1 tablet by mouth daily. Reported on 09/28/2015   Yes [provider]  Omega-3 Fatty Acids (FISH OIL) 300 MG CAPS Take 300 mg by mouth daily.    Yes [provider]  vitamin B-12 (CYANOCOBALAMIN) 1000 MCG tablet Take 2,500 mcg by mouth daily. Reported on 09/28/2015   Yes [provider]  Cecilia test strip TEST 4 TIMES A DAY AS DIRECTED BY PRESCIBER Patient taking differently: 1 each by Other route 4 (four) times daily.  06/27/18   [name], MD  ACCU-CHEK SOFTCLIX LANCETS lancets TEST 4 TIMES A DAY 06/27/18   [name], MD  blood glucose meter kit and supplies Dispense based on patient and insurance preference. Use up to four times daily as directed. (FOR ICD-9 250.00, 250.01). 04/09/17   [name], MD  mometasone (ELOCON) 0.1 % cream Apply 1 application topically daily. Patient not taking: Reported on 12/25/2018 05/13/18   Raylene Everts, MD     Allergies:     Allergies  Allergen Reactions  . Mercaptopurine Other (See Comments)     Caused Pancreatitis  Physical Exam:   Vitals  Blood pressure 124/86, pulse 84, temperature 97.8 F (36.6 C), temperature source Oral, resp. rate 16, height 5' 10"  (1.778 m), weight 127 kg, SpO2 96 %.   1. General developed male, laying in bed, in no apparent distress  2. Normal affect and insight, Not Suicidal or Homicidal, Awake Alert, Oriented X 3.  3. No F.N deficits, ALL C.Nerves Intact, Strength 5/5 all 4 extremities, Sensation intact all 4 extremities, Plantars down going.  4. Ears and Eyes appear Normal, Conjunctivae clear, PERRLA. Moist Oral Mucosa.  5. Supple Neck, No JVD, No cervical lymphadenopathy appriciated, No Carotid Bruits.  6. Symmetrical Chest wall movement, Good air movement bilaterally, CTAB.  7. RRR, No Gallops, Rubs or Murmurs, No Parasternal Heave.  8. Positive Bowel Sounds, Abdomen Soft, No tenderness, No organomegaly appriciated,No rebound -guarding or rigidity.  9.  No Cyanosis, delayed Skin Turgor, No Skin Rash or Bruise.  10. Good muscle tone,  joints appear normal , no effusions, Normal ROM.  11. No Palpable Lymph Nodes in Neck or Axillae     Data Review:    CBC Recent Labs  Lab 12/25/18 1420  WBC 7.1  HGB 15.1  HCT 45.7  PLT 177  MCV 97.2  MCH 32.1  MCHC 33.0  RDW 12.6  LYMPHSABS 1.6  MONOABS 0.9  EOSABS 0.1  BASOSABS 0.0   ------------------------------------------------------------------------------------------------------------------  Chemistries  Recent Labs  Lab 12/25/18 1420  NA 140  K 3.7  CL 102  CO2 21*  GLUCOSE 93  BUN 24*  CREATININE 2.49*  CALCIUM 10.4*  AST 50*  ALT 31  ALKPHOS 35*  BILITOT 2.1*   ------------------------------------------------------------------------------------------------------------------ estimated creatinine clearance is 40.1 mL/min (A) (by C-G formula based on SCr of 2.49 mg/dL (H)).  ------------------------------------------------------------------------------------------------------------------ No results for input(s): TSH, T4TOTAL, T3FREE, THYROIDAB in the last 72 hours.  Invalid input(s): FREET3  Coagulation profile No results for input(s): INR, PROTIME in the last 168 hours. ------------------------------------------------------------------------------------------------------------------- No results for input(s): DDIMER in the last 72 hours. -------------------------------------------------------------------------------------------------------------------  Cardiac Enzymes No results for input(s): CKMB, TROPONINI, MYOGLOBIN in the last 168 hours.  Invalid input(s): CK ------------------------------------------------------------------------------------------------------------------ No results found for: BNP   ---------------------------------------------------------------------------------------------------------------  Urinalysis    Component Value Date/Time   COLORURINE YELLOW 08/19/2015 1820   APPEARANCEUR CLEAR 08/19/2015 1820   LABSPEC 1.033 (H) 08/19/2015 1820   PHURINE 5.5 08/19/2015 1820   GLUCOSEU >1000 (A) 08/19/2015 1820   GLUCOSEU NEGATIVE 12/22/2009 0747   HGBUR NEGATIVE 08/19/2015 1820   BILIRUBINUR n 08/30/2016 0957   KETONESUR >80 (A) 08/19/2015 1820   PROTEINUR n 08/30/2016 0957   PROTEINUR NEGATIVE 08/19/2015 1820   UROBILINOGEN 0.2 08/30/2016 0957   UROBILINOGEN 0.2 12/22/2009 0747   NITRITE n 08/30/2016 0957   NITRITE NEGATIVE 08/19/2015 1820   LEUKOCYTESUR Negative 08/30/2016 0957    ----------------------------------------------------------------------------------------------------------------   Imaging Results:    No results found.  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 -Avoid nephrotoxic medication, continue with IV fluids. -Low bicarb, will give 1 L of bicarb drip. -Check BMP, total CK daily.  Near Syncope/hypotension -Near-syncope related to hypotension and soft blood pressure from  dehydration, he denies any chest pain, first troponin is negative, will hydrate, and monitor on telemetry overnight  Diabetes mellitus -We will hold metformin, will keep an extra sliding scale during hospital stay  Ulcerative colitis -Continue with home dose mesalamine -Patient on Humira as an outpatient, certainly will hold during hospital stay  Hyperlipidemia -We will hold home dose statin secondary to rhabdo   DVT Prophylaxis Heparin - SCDs   AM Labs Ordered, also please review Full Orders  Family Communication: Admission, patients condition and plan of care including tests being ordered have been discussed with the patient  who indicate understanding and agree with the plan and Code Status.  Code Status Full  Likely DC to  home  Condition GUARDED    Consults called: None  Admission status: Inpatient, as presents with renal failure and rhabdo, they would anticipate he will need more than 24 hours on IV fluids  Time spent in minutes : 55 minutes   Phillips Climes M.D on 12/25/2018 at 4:42 PM  Between 7am to 7pm - Pager - [phone]. After 7pm go to [email or web address] - password [facility]  [facility] - Office  [phone]

## 2018-12-25 NOTE — ED Triage Notes (Signed)
Pt was working outside when he began to have cramps in wrist and forearm. 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 EMS found pt hypotensive 80/60 and cool/dyaphoretic.  Given 1liter of fluid with improvment

## 2018-12-25 NOTE — ED Notes (Signed)
ED TO INPATIENT HANDOFF REPORT  ED Nurse Name and Phone #: [phone]  S Name/Age/Gender Georgie Chard 63 y.o. male Room/Bed: 020C/020C  Code Status   Code Status: Prior  Home/SNF/Other Home Patient oriented to: self, place, time and situation Is this baseline? Yes   Triage Complete: Triage complete  Chief Complaint heat exhaustion  Triage Note Pt was working outside when he began to have cramps in wrist and forearm. 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 EMS found pt hypotensive 80/60 and cool/dyaphoretic.  Given 1liter of fluid with improvment    Allergies Allergies  Allergen Reactions  . Mercaptopurine Other (See Comments)    Caused Pancreatitis    Level of Care/Admitting Diagnosis ED Disposition    ED Disposition Condition Comment   Admit  Hospital Area: [facility] [[id number]]  Level of Care: Telemetry Medical [[id number]]  Covid Evaluation: Asymptomatic Screening Protocol (No Symptoms)  Diagnosis: AKI (acute kidney injury) [facility]) [[id number]]  Admitting Physician: Manfred Shirts  Attending Physician: Waldron Labs, DAWOOD S [[id number]]  Estimated length of stay: past midnight tomorrow  Certification:: I certify this patient will need inpatient services for at least 2 midnights  PT Class (Do Not Modify): Inpatient [101]  PT Acc Code (Do Not Modify): Private [1]       B Medical/Surgery History Past Medical History:  Diagnosis Date  .  vitamin D deficiency 01/24/2013   01/23/13 - level 29 - supplements 50k IU weekly x 6 recommended   . Anabolic steroid abuse   . Avascular necrosis of femoral head (HCC)    bilateral  . Bleeding hemorrhoids   . Colitis 08/06/2013   Mildly actibr colitis  . Diabetes mellitus without complication ([place]) 8588   type 2  . Drug-induced acute pancreatitis - 6MP 09/07/2015  . Erectile dysfunction   . Hypertension   . Left sided ulcerative colitis ([place])   . Spinal stenosis of lumbar region at multiple levels     Past Surgical History:  Procedure Laterality Date  . COLONOSCOPY     multiple  . KNEE ARTHROSCOPY Right   . SIGMOIDOSCOPY       A IV Location/Drains/Wounds Patient Lines/Drains/Airways Status   Active Line/Drains/Airways    Name:   Placement date:   Placement time:   Site:   Days:   Peripheral IV 12/25/18 Right Antecubital   12/25/18    1432    Antecubital   less than 1          Intake/Output Last 24 hours No intake or output data in the 24 hours ending 12/25/18 1635  Labs/Imaging Results for orders placed or performed during the hospital encounter of 12/25/18 (from the past 48 hour(s))  CBC with Differential/Platelet     Status: None   Collection Time: 12/25/18  2:20 PM  Result Value Ref Range   WBC 7.1 4.0 - 10.5 K/uL   RBC 4.70 4.22 - 5.81 MIL/uL   Hemoglobin 15.1 13.0 - 17.0 g/dL   HCT 45.7 39.0 - 52.0 %   MCV 97.2 80.0 - 100.0 fL   MCH 32.1 26.0 - 34.0 pg   MCHC 33.0 30.0 - 36.0 g/dL   RDW 12.6 11.5 - 15.5 %   Platelets 177 150 - 400 K/uL   nRBC 0.0 0.0 - 0.2 %   Neutrophils Relative % 63 %   Neutro Abs 4.4 1.7 - 7.7 K/uL   Lymphocytes Relative 23 %   Lymphs Abs 1.6 0.7 - 4.0  K/uL   Monocytes Relative 12 %   Monocytes Absolute 0.9 0.1 - 1.0 K/uL   Eosinophils Relative 1 %   Eosinophils Absolute 0.1 0.0 - 0.5 K/uL   Basophils Relative 0 %   Basophils Absolute 0.0 0.0 - 0.1 K/uL   Immature Granulocytes 1 %   Abs Immature Granulocytes 0.04 0.00 - 0.07 K/uL    Comment: Performed at [facility] [address], [place], [place] [address]  Comprehensive metabolic panel     Status: Abnormal   Collection Time: 12/25/18  2:20 PM  Result Value Ref Range   Sodium 140 135 - 145 mmol/L   Potassium 3.7 3.5 - 5.1 mmol/L   Chloride 102 98 - 111 mmol/L   CO2 21 (L) 22 - 32 mmol/L   Glucose, Bld 93 70 - 99 mg/dL   BUN 24 (H) 8 - 23 mg/dL   Creatinine, Ser 2.49 (H) 0.61 - 1.24 mg/dL   Calcium 10.4 (H) 8.9 - 10.3 mg/dL   Total Protein 8.2 (H) 6.5 - 8.1 g/dL    Albumin 4.5 3.5 - 5.0 g/dL   AST 50 (H) 15 - 41 U/L   ALT 31 0 - 44 U/L   Alkaline Phosphatase 35 (L) 38 - 126 U/L   Total Bilirubin 2.1 (H) 0.3 - 1.2 mg/dL   GFR calc non Af Amer 26 (L) >60 mL/min   GFR calc Af Amer 30 (L) >60 mL/min   Anion gap 17 (H) 5 - 15    Comment: Performed at [facility], [place] [address] [place], [place] [address]  CK     Status: Abnormal   Collection Time: 12/25/18  2:20 PM  Result Value Ref Range   Total CK 2,559 (H) 49 - 397 U/L    Comment: Performed at [facility], [place] [address], [place], [place] [address]  Troponin I (High Sensitivity)     Status: None   Collection Time: 12/25/18  2:20 PM  Result Value Ref Range   Troponin I (High Sensitivity) 10 <18 ng/L    Comment: (NOTE) Elevated high sensitivity troponin I (hsTnI) values and significant  changes across serial measurements may suggest ACS but many other  chronic and acute conditions are known to elevate hsTnI results.  Refer to the "Links" section for chest pain algorithms and additional  guidance. Performed at [facility], [place] [address] [place], [place] [address]    No results found.  Pending Labs Unresulted Labs (From admission, onward)    Start     Ordered   12/25/18 1610  CK  Daily,   R     12/25/18 1609   12/25/18 1523  Urinalysis, Routine w reflex microscopic  ONCE - STAT,   STAT     12/25/18 1522   12/25/18 1523  SARS Coronavirus 2 (CEPHEID - Performed in [facility] hospital lab), Hosp Order  (Asymptomatic Patients Labs)  Once,   STAT    Question:  Rule Out  Answer:  Yes   12/25/18 1522   Signed and Held  HIV antibody (Routine Testing)  Once,   R     Signed and Held   Signed and Held  CBC  (heparin)  Once,   R    Comments: Baseline for heparin therapy IF NOT ALREADY DRAWN.  Notify MD if PLT < 100 K.    Signed and Held   Signed and Held  Creatinine, serum  (heparin)  Once,  R    Comments: Baseline for heparin therapy IF NOT ALREADY DRAWN.    Signed and  Held   Signed and Held  Comprehensive metabolic panel  Tomorrow morning,   R     Signed and Held   Signed and Held  CBC  Tomorrow morning,   R     Signed and Held          Vitals/Pain Today's Vitals   12/25/18 1400 12/25/18 1401 12/25/18 1407  BP:   124/86  Pulse:   84  Resp:   16  Temp:   97.8 F (36.6 C)  TempSrc:   Oral  SpO2:   96%  Weight:  127 kg   Height:  5' 10"  (1.778 m)   PainSc: 0-No pain      Isolation Precautions No active isolations  Medications Medications  0.9 %  sodium chloride infusion ( Intravenous New Bag/Given 12/25/18 1633)  sodium chloride 0.9 % bolus 500 mL (0 mLs Intravenous Stopped 12/25/18 1632)    Mobility walks High fall risk   Focused Assessments observation   R Recommendations: See Admitting Provider Note  Report given to:   Additional Notes:

## 2018-12-25 NOTE — ED Provider Notes (Signed)
[facility] EMERGENCY DEPARTMENT Provider Note   CSN: [id number] Arrival date & time: 12/25/18  1352     History   Chief Complaint Chief Complaint  Patient presents with  . Near Syncope  . Hypotension    HPI Angel Frazier is a 63 y.o. male.     Patient with history of diabetes, ulcerative colitis presents to the emergency department today with complaint of near syncope/syncope.  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  He drank multiple bottles of water and Gatorade.  After they were finished he was in a truck when he developed cramps in his wrists and forearms.  A friend asked if he wanted to stop by the fire department he said yes.  Patient reportedly had a syncopal episode in the truck but he does not remember this.  He was found to be hypotensive at the fire department by EMS with a blood pressure of 80/60.  He is cool and diaphoretic but denies any chest pain or shortness of breath.  No abdominal pain.  No recent nausea, vomiting, or diarrhea.  Patient was treated with a liter of normal saline with some improvement.  His blood pressure is now improved.  Patient states that his blood sugars have been running about 120.  He checks his blood sugars every morning.  Denies any history of heart problems or lung problems.  No recent illnesses or fevers.  Onset of symptoms acute.  Course is improving.     Past Medical History:  Diagnosis Date  .  vitamin D deficiency 01/24/2013   01/23/13 - level 29 - supplements 50k IU weekly x 6 recommended   . Anabolic steroid abuse   . Avascular necrosis of femoral head (HCC)    bilateral  . Bleeding hemorrhoids   . Colitis 08/06/2013   Mildly actibr colitis  . Diabetes mellitus without complication ([place]) 5035   type 2  . Drug-induced acute pancreatitis - 6MP 09/07/2015  . Erectile dysfunction   . Hypertension   . Left sided ulcerative colitis ([place])   . Spinal  stenosis of lumbar region at multiple levels     Patient Active Problem List   Diagnosis Date Noted  . Diabetes ([place]) 12/22/2018  . Pulmonary nodules 09/07/2015  . Long-term use of immunosuppressant medication Humira 07/12/2015  . Long term current use of systemic steroids 11/18/2014  . Avascular necrosis of femoral heads 11/18/2014  . Vitamin D deficiency 01/24/2013  . ED (erectile dysfunction) 05/21/2012  . LEFT SIDED ULCERATIVE COLITIS 08/11/2008    Past Surgical History:  Procedure Laterality Date  . COLONOSCOPY     multiple  . KNEE ARTHROSCOPY Right   . SIGMOIDOSCOPY          Home Medications    Prior to Admission medications   Medication Sig Start Date End Date Taking? Authorizing Provider  ACCU-CHEK AVIVA PLUS test strip TEST 4 TIMES A DAY AS DIRECTED BY PRESCIBER 06/27/18   [name], MD  ACCU-CHEK SOFTCLIX LANCETS lancets TEST 4 TIMES A DAY 06/27/18   [name], MD  Adalimumab (HUMIRA PEN) 40 MG/0.4ML PNKT Inject 40 mg into the skin every 14 (fourteen) days. 06/23/18   Gatha Mayer, MD  Ascorbic Acid (VITAMIN C) 1000 MG tablet Take 1,000 mg by mouth daily. Reported on 09/28/2015    [provider]  aspirin 81 MG tablet Take 81 mg by mouth daily. Reported on  09/28/2015    [provider]  atorvastatin (LIPITOR) 20 MG tablet Take 1 tablet (20 mg total) by mouth once a week. 12/04/18   [name], MD  blood glucose meter kit and supplies Dispense based on patient and insurance preference. Use up to four times daily as directed. (FOR ICD-9 250.00, 250.01). 04/09/17   [name], MD  Calcium Carbonate-Vitamin D (CALCIUM-VITAMIN D3 PO) Take 1 tablet by mouth daily. Reported on 09/28/2015    [provider]  Cholecalciferol (VITAMIN D) 2000 units CAPS Take 1 capsule by mouth daily.    [provider]  mesalamine (LIALDA) 1.2 g EC tablet Take 2 tablets (2.4 g total) by mouth 2 (two) times daily. 12/11/17   Gatha Mayer, MD  metFORMIN (GLUCOPHAGE) 1000 MG tablet Take 1 tablet (1,000 mg total) by mouth 2 (two) times daily with a meal. 12/22/18   Renato Shin, MD  METHOCARBAMOL PO Take 500 mg by mouth daily as needed (for muscle spasms).     [provider]  mometasone (ELOCON) 0.1 % cream Apply 1 application topically daily. 05/13/18   Raylene Everts, MD  Multiple Vitamins-Minerals (CENTRUM SILVER PO) Take 1 tablet by mouth daily. Reported on 09/28/2015    [provider]  Omega-3 Fatty Acids (FISH OIL) 300 MG CAPS Take 300 mg by mouth daily.     [provider]  vitamin B-12 (CYANOCOBALAMIN) 1000 MCG tablet Take 2,500 mcg by mouth daily. Reported on 09/28/2015    [provider]    Family History Family History  Problem Relation Age of Onset  . Diabetes Mother   . Diabetes Other        cousin  . Hypertension Other   . Diabetes type I Son   . Colon cancer Neg Hx   . Esophageal cancer Neg Hx   . Rectal cancer Neg Hx   . Stomach cancer Neg Hx     Social History Social History   Tobacco Use  . Smoking status: Former Smoker    Packs/day: 1.00    Years: 6.00    Pack years: 6.00    Types: Cigarettes    Quit date: 06/04/2000    Years since quitting: 18.5  . Smokeless tobacco: Never Used  Substance Use Topics  . Alcohol use: Yes    Comment: occasionally  . Drug use: No     Allergies   Mercaptopurine   Review of Systems Review of Systems  Constitutional: Positive for diaphoresis. Negative for fever.  Eyes: Negative for redness.  Respiratory: Negative for cough and shortness of breath.   Cardiovascular: Negative for chest pain, palpitations and leg swelling.  Gastrointestinal: Negative for abdominal pain, nausea and vomiting.  Genitourinary: Negative for dysuria.  Musculoskeletal: Positive for myalgias. Negative for back pain and neck pain.  Skin: Negative for rash.  Neurological: Positive for syncope and light-headedness.   Psychiatric/Behavioral: The patient is not nervous/anxious.      Physical Exam Updated Vital Signs BP 124/86 (BP Location: Right Arm)   Pulse 84   Temp 97.8 F (36.6 C) (Oral)   Resp 16   Ht 5' 10"  (1.778 m)   Wt 127 kg   SpO2 96%   BMI 40.18 kg/m   Physical Exam Vitals signs and nursing note reviewed.  Constitutional:      Appearance: He is well-developed. He is not diaphoretic.  HENT:     Head: Normocephalic and atraumatic.     Mouth/Throat:  Mouth: Mucous membranes are not dry.  Eyes:     Conjunctiva/sclera: Conjunctivae normal.  Neck:     Musculoskeletal: Normal range of motion and neck supple. No muscular tenderness.     Vascular: Normal carotid pulses. No carotid bruit or JVD.     Trachea: Trachea normal. No tracheal deviation.  Cardiovascular:     Rate and Rhythm: Normal rate and regular rhythm.     Pulses: No decreased pulses.     Heart sounds: Normal heart sounds, S1 normal and S2 normal. Heart sounds not distant. No murmur.  Pulmonary:     Effort: Pulmonary effort is normal. No respiratory distress.     Breath sounds: Normal breath sounds. No wheezing.  Chest:     Chest wall: No tenderness.  Abdominal:     General: Bowel sounds are normal.     Palpations: Abdomen is soft.     Tenderness: There is no abdominal tenderness. There is no guarding or rebound.  Skin:    General: Skin is dry.     Capillary Refill: Capillary refill takes 2 to 3 seconds.     Coloration: Skin is not pale.     Comments: Extremities are slightly cool and clammy.   Neurological:     Mental Status: He is alert.      ED Treatments / Results  Labs (all labs ordered are listed, but only abnormal results are displayed) Labs Reviewed  COMPREHENSIVE METABOLIC PANEL - Abnormal; Notable for the following components:      Result Value   CO2 21 (*)    BUN 24 (*)    Creatinine, Ser 2.49 (*)    Calcium 10.4 (*)    Total Protein 8.2 (*)    AST 50 (*)    Alkaline Phosphatase 35 (*)     Total Bilirubin 2.1 (*)    GFR calc non Af Amer 26 (*)    GFR calc Af Amer 30 (*)    Anion gap 17 (*)    All other components within normal limits  CK - Abnormal; Notable for the following components:   Total CK 2,559 (*)    All other components within normal limits  SARS CORONAVIRUS 2 (HOSPITAL ORDER, PERFORMED IN [facility])  CBC WITH DIFFERENTIAL/PLATELET  URINALYSIS, ROUTINE W REFLEX MICROSCOPIC  TROPONIN I (HIGH SENSITIVITY)  TROPONIN I (HIGH SENSITIVITY)    EKG EKG Interpretation  Date/Time:  Thursday December 25 2018 14:05:05 EDT Ventricular Rate:  85 PR Interval:    QRS Duration: 104 QT Interval:  360 QTC Calculation: 428 R Axis:   -60 Text Interpretation:  Sinus rhythm Left anterior fascicular block Consider right ventricular hypertrophy Left ventricular hypertrophy Confirmed by Quintella Reichert [phone]) on 12/25/2018 2:07:15 PM   Radiology No results found.  Procedures Procedures (including critical care time)  Medications Ordered in ED Medications  0.9 %  sodium chloride infusion (has no administration in time range)  sodium chloride 0.9 % bolus 500 mL (500 mLs Intravenous New Bag/Given 12/25/18 1452)     Initial Impression / Assessment and Plan / ED Course  I have reviewed the triage vital signs and the nursing notes.  Pertinent labs & imaging results that were available during my care of the patient were reviewed by me and considered in my medical decision making (see chart for details).        Patient seen and examined. Work-up initiated. Fluids ordered.   Vital signs reviewed and are as follows: BP 124/86 (BP Location:  Right Arm)   Pulse 84   Temp 97.8 F (36.6 C) (Oral)   Resp 16   Ht 5' 10"  (1.778 m)   Wt 127 kg   SpO2 96%   BMI 40.18 kg/m   Patient discussed with and seen by Dr. Ralene Bathe.  Patient with new acute kidney injury and mildly elevated creatinine in setting of recent exertion and probable heat illness. Pt updated. We  discussed benefits of admission and risks of discharge.   We will admit for IV hydration.  Spoke with Dr. Waldron Labs.   Final Clinical Impressions(s) / ED Diagnoses   Final diagnoses:  AKI (acute kidney injury) ([place])  Non-traumatic rhabdomyolysis   AKI/rhabdo admit.   ED Discharge Orders    None       Carlisle Cater, Hershal Coria 12/25/18 1605    Quintella Reichert, MD 12/26/18 [phone]

## 2018-12-25 NOTE — ED Notes (Signed)
Pt. is aware urine specimen is needed.

## 2018-12-25 NOTE — ED Notes (Addendum)
Please contact Yedidya Duddy, brother at [phone] for updates

## 2018-12-25 NOTE — Progress Notes (Signed)
Angel Frazier [id number] Admission Data: 12/25/2018 7:36 PM Attending Provider: Albertine Patricia, MD  [id number], Brayton Mars, MD Consults/ Treatment Team:   Angel Frazier is a 63 y.o. male patient admitted from ED awake, alert  & orientated  X 3,  Full Code, VSS - Blood pressure (!) 161/108, pulse 83, temperature 98.4 F (36.9 C), temperature source Oral, resp. rate 18, height 5' 10"  (1.778 m), weight 127 kg, SpO2 97 %., O2    RA no c/o shortness of breath, no c/o chest pain, no distress noted. Tele # 18 placed and pt is currently running:normal sinus rhythm.   IV site WDL:  antecubital right, condition patent and no redness with a transparent dsg that's clean dry and intact.  Allergies:   Allergies  Allergen Reactions  . Mercaptopurine Other (See Comments)    Caused Pancreatitis     Past Medical History:  Diagnosis Date  .  vitamin D deficiency 01/24/2013   01/23/13 - level 29 - supplements 50k IU weekly x 6 recommended   . Anabolic steroid abuse   . Avascular necrosis of femoral head (HCC)    bilateral  . Bleeding hemorrhoids   . Colitis 08/06/2013   Mildly actibr colitis  . Diabetes mellitus without complication ([place]) 3358   type 2  . Drug-induced acute pancreatitis - 6MP 09/07/2015  . Erectile dysfunction   . Hypertension   . Left sided ulcerative colitis ([place])   . Spinal stenosis of lumbar region at multiple levels       Pt orientation to unit, room and routine. Information packet given to patient/family and safety video watched.  Admission INP armband ID verified with patient/family, and in place. SR up x 2, fall risk assessment complete with Patient and family verbalizing understanding of risks associated with falls. Pt verbalizes an understanding of how to use the call bell and to call for help before getting out of bed.  Skin, clean-dry- intact without evidence of bruising, or skin tears.   No evidence of skin break down noted on exam. no rashes, no ecchymoses, no  petechiae, no nodules, no jaundice, no purpura, no wounds    Will cont to monitor and assist as needed.  Tresa Endo, RN 12/25/2018 7:36 PM

## 2018-12-25 NOTE — ED Notes (Signed)
When pt sat up during orthostatic VS pt experienced 6/10 cramping pain on the right upper epigastric  area. Pt had to lay back down to allow cramp to subside.

## 2018-12-26 LAB — CBC
HCT: 39.7 % (ref 39.0–52.0)
Hemoglobin: 13.1 g/dL (ref 13.0–17.0)
MCH: 31.9 pg (ref 26.0–34.0)
MCHC: 33 g/dL (ref 30.0–36.0)
MCV: 96.6 fL (ref 80.0–100.0)
Platelets: 187 10*3/uL (ref 150–400)
RBC: 4.11 MIL/uL — ABNORMAL LOW (ref 4.22–5.81)
RDW: 12.9 % (ref 11.5–15.5)
WBC: 6.1 10*3/uL (ref 4.0–10.5)
nRBC: 0 % (ref 0.0–0.2)

## 2018-12-26 LAB — HIV ANTIBODY (ROUTINE TESTING W REFLEX): HIV Screen 4th Generation wRfx: NONREACTIVE

## 2018-12-26 LAB — CK: Total CK: 1726 U/L — ABNORMAL HIGH (ref 49–397)

## 2018-12-26 LAB — MAGNESIUM: Magnesium: 1.4 mg/dL — ABNORMAL LOW (ref 1.7–2.4)

## 2018-12-26 MED ORDER — POTASSIUM CHLORIDE CRYS ER 20 MEQ PO TBCR
40.0000 meq | EXTENDED_RELEASE_TABLET | Freq: Once | ORAL | Status: AC
  Administered 2018-12-26: 40 meq via ORAL
  Filled 2018-12-26: qty 2

## 2018-12-26 MED ORDER — MAGNESIUM SULFATE 2 GM/50ML IV SOLN
2.0000 g | Freq: Once | INTRAVENOUS | Status: AC
  Administered 2018-12-26: 2 g via INTRAVENOUS
  Filled 2018-12-26: qty 50

## 2018-12-26 NOTE — Discharge Summary (Signed)
Physician Discharge Summary  JAZIAH GOELLER HTD:[id number] DOB: 26-Feb-1956 DOA: 12/25/2018  PCP: [name], MD  Admit date: 12/25/2018 Discharge date: 12/26/2018  Time spent: 45 minutes  Recommendations for Outpatient Follow-up:  Patient will be discharged to home.  Patient will need to follow up with primary care provider within one week of discharge, repeat BMP, magnesium, and CK level.  Patient should continue medications as prescribed.  Patient should follow a carb modified diet.   Discharge Diagnoses:  Acute kidney injury with rhabdomyolysis Near syncope/hypotension Diabetes mellitus, type II Ulcerative colitis Hyperlipidemia Hypokalemia  Discharge Condition: stable  Diet recommendation: Carb modified  Filed Weights   12/25/18 1401  Weight: 127 kg    History of present illness:  On 12/25/2018 by Dr. Emeline Gins Elgergawy Marjorie Lussier  is a 63 y.o. 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   Hospital Course:  Acute kidney injury with rhabdomyolysis -Likely secondary to volume depletion and dehydration with a heat exhaustion -Creatinine on admission 2.49 with a GFR 30, with baseline of less than 1 -UA unremarkable -COVID negative -Placed on IV fluids  -Creatinine now down to 1.29 -noted to have urine output of 350cc over the past 24 hours -CK level on admission 2559, down to 1726 -Repeat BMP and CK level in 1 week -appears that patient responded and improved quicker than expected  Near syncope/hypotension -[place] secondary to the above along with dehydration -No complaints of chest pain or dizziness at this time -Patient was placed on IV fluids -High-sensitivity troponin unremarkable  Diabetes mellitus, type II -Continue metformin on discharge  Ulcerative colitis -Continue mesalamine as well as Humira upon discharge  Hyperlipidemia -may continue statin on discharge  Hypokalemia/Hypomagnesemia -Replaced, repeat BMP, magnesium in one week  Procedures:  None  Consultations:  None  Discharge Exam: Vitals:   12/26/18 0635 12/26/18 1305  BP: 109/80 122/82  Pulse: 68 70  Resp:  18  Temp: 98 F (36.7 C) 97.9 F (36.6 C)  SpO2: 96% 100%     General: Well developed, well nourished, NAD, appears stated age  HEENT: NCAT, mucous membranes moist.  Cardiovascular: S1 S2 auscultated, RRR, no murmur  Respiratory: Clear to auscultation bilaterally with equal chest rise  Abdomen: Soft, nontender, nondistended, + bowel sounds  Extremities: warm dry without cyanosis clubbing or edema  Neuro: AAOx3, nonfocal  Psych: Normal affect and demeanor with intact judgement and insight  Discharge Instructions Discharge Instructions    Discharge instructions   Complete by: As directed    Patient will be discharged to home.  Patient will need to follow up with primary care provider within one week of discharge, repeat BMP, magnesium, and CK level.  Patient should continue medications as prescribed.  Patient should follow a carb modified diet.  Allergies as of 12/26/2018      Reactions   Mercaptopurine Other (See Comments)   Caused Pancreatitis      Medication List    STOP taking these medications   mometasone 0.1 %  cream Commonly known as: ELOCON     TAKE these medications   Accu-Chek Aviva Plus test strip Generic drug: glucose blood TEST 4 TIMES A DAY AS DIRECTED BY PRESCIBER What changed: See the new instructions.   Accu-Chek Softclix Lancets lancets TEST 4 TIMES A DAY   Adalimumab 40 MG/0.4ML Pnkt Commonly known as: Humira Pen Inject 40 mg into the skin every 14 (fourteen) days.   aspirin 81 MG tablet Take 81 mg by mouth daily. Reported on 09/28/2015   atorvastatin 20 MG tablet Commonly known as: LIPITOR Take 1 tablet (20 mg total) by mouth once a week. What changed: when to take this   blood glucose meter kit and supplies Dispense based on patient and insurance preference. Use up to four times daily as directed. (FOR ICD-9 250.00, 250.01).   CALCIUM-VITAMIN D3 PO Take 1 tablet by mouth daily. Reported on 09/28/2015   CENTRUM SILVER PO Take 1 tablet by mouth daily. Reported on 09/28/2015   Fish Oil 300 MG Caps Take 300 mg by mouth daily.   mesalamine 1.2 g EC tablet Commonly known as: Lialda Take 2 tablets (2.4 g total) by mouth 2 (two) times daily.   metFORMIN 1000 MG tablet Commonly known as: GLUCOPHAGE Take 1 tablet (1,000 mg total) by mouth 2 (two) times daily with a meal.   vitamin B-12 1000 MCG tablet Commonly known as: CYANOCOBALAMIN Take 2,500 mcg by mouth daily. Reported on 09/28/2015   vitamin C 1000 MG tablet Take 1,000 mg by mouth daily. Reported on 09/28/2015   VITAMIN D-3 PO Take 1 capsule by mouth daily with breakfast.      Allergies  Allergen Reactions  . Mercaptopurine Other (See Comments)    Caused Pancreatitis   Follow-up Information    [name], MD. Schedule an appointment as soon as possible for a visit in 1 week(s).   Specialty: Family Medicine Why: Hospital follow up- repeat BMP and CK level Contact information: [address] [place] [address] [phone]            The results of significant diagnostics  from this hospitalization (including imaging, microbiology, ancillary and laboratory) are listed below for reference.    Significant Diagnostic Studies: No results found.  Microbiology: Recent Results (from the past 240 hour(s))  SARS Coronavirus 2 (CEPHEID - Performed in [facility] hospital lab), Hosp Order     Status: None   Collection Time: 12/25/18  4:02 PM   Specimen: Nasopharyngeal Swab  Result Value Ref Range Status   SARS Coronavirus 2 NEGATIVE NEGATIVE Final    Comment: (NOTE) If result is NEGATIVE SARS-CoV-2 target nucleic acids are NOT DETECTED. The SARS-CoV-2 RNA is generally detectable in upper and lower  respiratory specimens during the acute phase of infection. The lowest  concentration of SARS-CoV-2 viral copies this assay can detect is 250  copies / mL. A negative result does not preclude SARS-CoV-2 infection  and should not be used as the sole basis for treatment or other  patient management decisions.  A negative result may occur with  improper specimen collection / handling, submission of specimen other  than nasopharyngeal swab, presence of viral mutation(s) within the  areas targeted by this assay, and inadequate number of viral copies  (<  250 copies / mL). A negative result must be combined with clinical  observations, patient history, and epidemiological information. If result is POSITIVE SARS-CoV-2 target nucleic acids are DETECTED. The SARS-CoV-2 RNA is generally detectable in upper and lower  respiratory specimens dur ing the acute phase of infection.  Positive  results are indicative of active infection with SARS-CoV-2.  Clinical  correlation with patient history and other diagnostic information is  necessary to determine patient infection status.  Positive results do  not rule out bacterial infection or co-infection with other viruses. If result is PRESUMPTIVE POSTIVE SARS-CoV-2 nucleic acids MAY BE PRESENT.   A presumptive positive result was  obtained on the submitted specimen  and confirmed on repeat testing.  While 2019 novel coronavirus  (SARS-CoV-2) nucleic acids may be present in the submitted sample  additional confirmatory testing may be necessary for epidemiological  and / or clinical management purposes  to differentiate between  SARS-CoV-2 and other Sarbecovirus currently known to infect humans.  If clinically indicated additional testing with an alternate test  methodology [phone]) is advised. The SARS-CoV-2 RNA is generally  detectable in upper and lower respiratory sp ecimens during the acute  phase of infection. The expected result is Negative. Fact Sheet for Patients:  StrictlyIdeas.no Fact Sheet for Healthcare Providers: BankingDealers.co.za This test is not yet approved or cleared by the [place] FDA and has been authorized for detection and/or diagnosis of SARS-CoV-2 by FDA under an Emergency Use Authorization (EUA).  This EUA will remain in effect (meaning this test can be used) for the duration of the COVID-19 declaration under Section 564(b)(1) of the Act, 21 U.S.C. section 360bbb-3(b)(1), unless the authorization is terminated or revoked sooner. Performed at [facility], [place] [address] [place], [place] [address]      Labs: Basic Metabolic Panel: Recent Labs  Lab 12/25/18 1420 12/26/18 0336 12/26/18 0425  NA 140 137  --   K 3.7 3.1*  --   CL 102 102  --   CO2 21* 20*  --   GLUCOSE 93 90  --   BUN 24* 22  --   CREATININE 2.49* 1.29*  --   CALCIUM 10.4* 9.0  --   MG  --   --  1.4*   Liver Function Tests: Recent Labs  Lab 12/25/18 1420  AST 50*  ALT 31  ALKPHOS 35*  BILITOT 2.1*  PROT 8.2*  ALBUMIN 4.5   No results for input(s): LIPASE, AMYLASE in the last 168 hours. No results for input(s): AMMONIA in the last 168 hours. CBC: Recent Labs  Lab 12/25/18 1420 12/26/18 0336  WBC 7.1 6.1  NEUTROABS 4.4  --   HGB 15.1  13.1  HCT 45.7 39.7  MCV 97.2 96.6  PLT 177 187   Cardiac Enzymes: Recent Labs  Lab 12/25/18 1420 12/26/18 0336  CKTOTAL 2,559* 1,726*   BNP: BNP (last 3 results) No results for input(s): BNP in the last 8760 hours.  ProBNP (last 3 results) No results for input(s): PROBNP in the last 8760 hours.  CBG: No results for input(s): GLUCAP in the last 168 hours.     Signed:  Cristal Ford  [facility] 12/26/2018, 2:15 PM

## 2018-12-26 NOTE — Discharge Instructions (Signed)
Acute Kidney Injury, Adult  Acute kidney injury is a sudden worsening of kidney function. The kidneys are organs that have several jobs. They filter the blood to remove waste products and extra fluid. They also maintain a healthy balance of minerals and hormones in the body, which helps control blood pressure and keep bones strong. With this condition, your kidneys do not do their jobs as well as they should. This condition ranges from mild to severe. Over time it may develop into long-lasting (chronic) kidney disease. Early detection and treatment may prevent acute kidney injury from developing into a chronic condition. What are the causes? Common causes of this condition include:  A problem with blood flow to the kidneys. This may be caused by: ? Low blood pressure (hypotension) or shock. ? Blood loss. ? Heart and blood vessel (cardiovascular) disease. ? Severe burns. ? Liver disease.  Direct damage to the kidneys. This may be caused by: ? Certain medicines. ? A kidney infection. ? Poisoning. ? Being around or in contact with toxic substances. ? A surgical wound. ? A hard, direct hit to the kidney area.  A sudden blockage of urine flow. This may be caused by: ? Cancer. ? Kidney stones. ? An enlarged prostate in males. What are the signs or symptoms? Symptoms of this condition may not be obvious until the condition becomes severe. Symptoms of this condition can include:  Tiredness (lethargy), or difficulty staying awake.  Nausea or vomiting.  Swelling (edema) of the face, legs, ankles, or feet.  Problems with urination, such as: ? Abdominal pain, or pain along the side of your stomach (flank). ? Decreased urine production. ? Decrease in the force of urine flow.  Muscle twitches and cramps, especially in the legs.  Confusion or trouble concentrating.  Loss of appetite.  Fever. How is this diagnosed? This condition may be diagnosed with tests, including:  Blood  tests.  Urine tests.  Imaging tests.  A test in which a sample of tissue is removed from the kidneys to be examined under a microscope (kidney biopsy). How is this treated? Treatment for this condition depends on the cause and how severe the condition is. In mild cases, treatment may not be needed. The kidneys may heal on their own. In more severe cases, treatment will involve:  Treating the cause of the kidney injury. This may involve changing any medicines you are taking or adjusting your dosage.  Fluids. You may need specialized IV fluids to balance your body's needs.  Having a catheter placed to drain urine and prevent blockages.  Preventing problems from occurring. This may mean avoiding certain medicines or procedures that can cause further injury to the kidneys. In some cases treatment may also require:  A procedure to remove toxic wastes from the body (dialysis or continuous renal replacement therapy - CRRT).  Surgery. This may be done to repair a torn kidney, or to remove the blockage from the urinary system. Follow these instructions at home: Medicines  Take over-the-counter and prescription medicines only as told by your health care provider.  Do not take any new medicines without your health care provider's approval. Many medicines can worsen your kidney damage.  Do not take any vitamin and mineral supplements without your health care provider's approval. Many nutritional supplements can worsen your kidney damage. Lifestyle  If your health care provider prescribed changes to your diet, follow them. You may need to decrease the amount of protein you eat.  Achieve and maintain a healthy  weight. If you need help with this, ask your health care provider.  Start or continue an exercise plan. Try to exercise at least 30 minutes a day, 5 days a week.  Do not use any tobacco products, such as cigarettes, chewing tobacco, and e-cigarettes. If you need help quitting, ask your  health care provider. General instructions  Keep track of your blood pressure. Report changes in your blood pressure as told by your health care provider.  Stay up to date with immunizations. Ask your health care provider which immunizations you need.  Keep all follow-up visits as told by your health care provider. This is important. Where to find more information  American Association of Kidney Patients: BombTimer.gl  National Kidney Foundation: www.kidney.[place]: https://mathis.com/  [facility]: ? www.lifeoptions.org ? www.kidneyschool.org Contact a health care provider if:  Your symptoms get worse.  You develop new symptoms. Get help right away if:  You develop symptoms of worsening kidney disease, which include: ? Headaches. ? Abnormally dark or light skin. ? Easy bruising. ? Frequent hiccups. ? Chest pain. ? Shortness of breath. ? End of menstruation in women. ? Seizures. ? Confusion or altered mental status. ? Abdominal or back pain. ? Itchiness.  You have a fever.  Your body is producing less urine.  You have pain or bleeding when you urinate. Summary  Acute kidney injury is a sudden worsening of kidney function.  Acute kidney injury can be caused by problems with blood flow to the kidneys, direct damage to the kidneys, and sudden blockage of urine flow.  Symptoms of this condition may not be obvious until it becomes severe. Symptoms may include edema, lethargy, confusion, nausea or vomiting, and problems passing urine.  This condition can usually be diagnosed with blood tests, urine tests, and imaging tests. Sometimes a kidney biopsy is done to diagnose this condition.  Treatment for this condition often involves treating the underlying cause. It is treated with fluids, medicines, dialysis, diet changes, or surgery. This information is not intended to replace advice given to you by your health care provider. Make  sure you discuss any questions you have with your health care provider. Document Released: 12/04/2010 Document Revised: 05/03/2017 Document Reviewed: 05/11/2016 Elsevier Patient Education  2020 [employer].   Rhabdomyolysis Rhabdomyolysis is a condition that happens when muscle cells break down and release substances into the blood that can damage the kidneys. This condition happens because of damage to the muscles that move bones (skeletal muscle). When the skeletal muscles are damaged, substances inside the muscle cells go into the blood. One of these substances is a protein called myoglobin. Large amounts of myoglobin can cause kidney damage or kidney failure. Other substances that are released by muscle cells may upset the balance of the minerals (electrolytes) in your blood. This imbalance causes your blood to have too much acid (acidosis). What are the causes? This condition is caused by muscle damage. Muscle damage often happens because of:  Using your muscles too much.  An injury that crushes or squeezes a muscle too tightly.  Using illegal drugs, mainly cocaine.  Alcohol abuse. Other possible causes include:  Prescription medicines, such as those that: ? Lower cholesterol (statins). ? Treat ADHD (attention deficit hyperactivity disorder) or help with weight loss (amphetamines). ? Treat pain (opiates).  Infections.  Muscle diseases that are passed down from parent to child (inherited).  High fever.  Heatstroke.  Not having enough fluids in your body (dehydration).  Seizures.  Surgery. What increases the risk? This condition is more likely to develop in people who:  Have a family history of muscle disease.  Take part in extreme sports, such as running in marathons.  Have diabetes.  Are older.  Abuse drugs or alcohol. What are the signs or symptoms? Symptoms of this condition vary. Some people have very few symptoms, and other people have many symptoms. The  most common symptoms include:  Muscle pain and swelling.  Weak muscles.  Dark urine.  Feeling weak and tired. Other symptoms include:  Nausea and vomiting.  Fever.  Pain in the abdomen.  Pain in the joints. Symptoms of complications from this condition include:  Heart rhythm that is not normal (arrhythmia).  Seizures.  Not urinating enough because of kidney failure.  Very low blood pressure (shock). Signs of shock include dizziness, blurry vision, and clammy skin.  Bleeding that is hard to stop or control. How is this diagnosed? This condition is diagnosed based on your medical history, your symptoms, and a physical exam. Tests may also be done, including:  Blood tests.  Urine tests to check for myoglobin. You may also have other tests to check for causes of muscle damage and to check for complications. How is this treated? Treatment for this condition helps to:  Make sure you have enough fluids in your body.  Lower the acid levels in your blood to reverse acidosis.  Protect your kidneys. Treatment may include:  Fluids and medicines given through an IV tube that is inserted into one of your veins.  Medicines to lower acidosis or to bring back the balance of the minerals in your body.  Hemodialysis. This treatment uses an artificial kidney machine to filter your blood while you recover. You may have this if other treatments are not helping. Follow these instructions at home:   Take over-the-counter and prescription medicines only as told by your health care provider.  Rest at home until your health care provider says that you can return to your normal activities.  Drink enough fluid to keep your urine clear or pale yellow.  Do not do activities that take a lot of effort (are strenuous). Ask your health care provider what level of exercise is safe for you.  Do not abuse drugs or alcohol. If you are having problems with drug or alcohol use, ask your health  care provider for help.  Keep all follow-up visits as told by your health care provider. This is important. Contact a health care provider if:  You start having symptoms of this condition after treatment. Get help right away if:  You have a seizure.  You bleed easily or cannot control bleeding.  You cannot urinate.  You have chest pain.  You have trouble breathing. This information is not intended to replace advice given to you by your health care provider. Make sure you discuss any questions you have with your health care provider. Document Released: 05/03/2004 Document Revised: 05/03/2017 Document Reviewed: 03/02/2016 Elsevier Patient Education  2020 [employer].

## 2018-12-27 LAB — BASIC METABOLIC PANEL
Anion gap: 15 (ref 5–15)
BUN: 22 mg/dL (ref 8–23)
CO2: 20 mmol/L — ABNORMAL LOW (ref 22–32)
Calcium: 9 mg/dL (ref 8.9–10.3)
Chloride: 102 mmol/L (ref 98–111)
Creatinine, Ser: 1.29 mg/dL — ABNORMAL HIGH (ref 0.61–1.24)
GFR calc Af Amer: >60 mL/min (ref >60)
GFR calc non Af Amer: 58 mL/min — ABNORMAL LOW (ref >60)
Glucose, Bld: 90 mg/dL (ref 70–99)
Potassium: 3.1 mmol/L — ABNORMAL LOW (ref 3.5–5.1)
Sodium: 137 mmol/L (ref 135–145)

## 2018-12-29 ENCOUNTER — Telehealth: Payer: Self-pay

## 2018-12-29 ENCOUNTER — Telehealth: Payer: Self-pay | Admitting: Family Medicine

## 2018-12-29 NOTE — Telephone Encounter (Signed)
Patient calling for negative COVID results. Patient expressed understanding.

## 2018-12-29 NOTE — Telephone Encounter (Signed)
LM for patient to return call for TCM. 

## 2019-01-08 ENCOUNTER — Ambulatory Visit (INDEPENDENT_AMBULATORY_CARE_PROVIDER_SITE_OTHER): Payer: Medicare Other | Admitting: Internal Medicine

## 2019-01-08 DIAGNOSIS — K5150 Left sided colitis without complications: Secondary | ICD-10-CM | POA: Diagnosis not present

## 2019-01-08 DIAGNOSIS — Z7960 Long term (current) use of unspecified immunomodulators and immunosuppressants: Secondary | ICD-10-CM

## 2019-01-08 DIAGNOSIS — Z79899 Other long term (current) drug therapy: Secondary | ICD-10-CM | POA: Diagnosis not present

## 2019-01-08 NOTE — Assessment & Plan Note (Addendum)
Doing well about 3 months into the Humira.  I would like him to continue on that and in about 6 months total treatment time around November to a colonoscopy assuming he is well at that point.  If he is in clinical and endoscopic remission stopping Lialda

## 2019-01-08 NOTE — Assessment & Plan Note (Addendum)
No toxicities or problems noted.

## 2019-01-08 NOTE — Progress Notes (Signed)
Angel Frazier 62 y.o. 1956-04-04 [id number]   LEFT SIDED ULCERATIVE COLITIS Doing well about 3 months into the Humira.  I would like him to continue on that and in about 6 months total treatment time around November to a colonoscopy assuming he is well at that point.  If he is in clinical and endoscopic remission stopping Lialda  Long-term use of immunosuppressant medication Humira No toxicities or problems noted.     Subjective:   Chief Complaint: Follow-up left-sided ulcerative colitis  HPI Angel Frazier is doing well l with his ulcerative colitis since he has started Humira and thinks he has been on it for about 3 months.  No significant diarrhea no rectal bleeding or hematochezia and no significant abdominal pain.  He had a brief overnight hospitalization due to dehydration and rhabdomyolysis and acute kidney injury in late July he had been out doing yard work and mowing.  He felt like he was hydrating well but still had this problem.  He has follow-up with Dr. Yong Channel regarding this soon. Allergies  Allergen Reactions  . Mercaptopurine Other (See Comments)    Caused Pancreatitis   Current Meds  Medication Sig  . ACCU-CHEK AVIVA PLUS test strip TEST 4 TIMES A DAY AS DIRECTED BY PRESCIBER (Patient taking differently: 1 each by Other route 4 (four) times daily. )  . ACCU-CHEK SOFTCLIX LANCETS lancets TEST 4 TIMES A DAY  . Adalimumab (HUMIRA PEN) 40 MG/0.4ML PNKT Inject 40 mg into the skin every 14 (fourteen) days.  . Ascorbic Acid (VITAMIN C) 1000 MG tablet Take 1,000 mg by mouth daily. Reported on 09/28/2015  . aspirin 81 MG tablet Take 81 mg by mouth daily. Reported on 09/28/2015  . atorvastatin (LIPITOR) 20 MG tablet Take 1 tablet (20 mg total) by mouth once a week. (Patient taking differently: Take 20 mg by mouth every Sunday. )  . blood glucose meter kit and supplies Dispense based on patient and insurance preference. Use up to four times daily as directed. (FOR ICD-9 250.00, 250.01).   . Calcium Carbonate-Vitamin D (CALCIUM-VITAMIN D3 PO) Take 1 tablet by mouth daily. Reported on 09/28/2015  . Cholecalciferol (VITAMIN D-3 PO) Take 1 capsule by mouth daily with breakfast.  . mesalamine (LIALDA) 1.2 g EC tablet Take 2 tablets (2.4 g total) by mouth 2 (two) times daily.  . metFORMIN (GLUCOPHAGE) 1000 MG tablet Take 1 tablet (1,000 mg total) by mouth 2 (two) times daily with a meal.  . Multiple Vitamins-Minerals (CENTRUM SILVER PO) Take 1 tablet by mouth daily. Reported on 09/28/2015  . Omega-3 Fatty Acids (FISH OIL) 300 MG CAPS Take 300 mg by mouth daily.   . vitamin B-12 (CYANOCOBALAMIN) 1000 MCG tablet Take 2,500 mcg by mouth daily. Reported on 09/28/2015   Past Medical History:  Diagnosis Date  .  vitamin D deficiency 01/24/2013   01/23/13 - level 29 - supplements 50k IU weekly x 6 recommended   . Anabolic steroid abuse   . Avascular necrosis of femoral head (HCC)    bilateral  . Bleeding hemorrhoids   . Colitis 08/06/2013   Mildly actibr colitis  . Diabetes mellitus without complication ([place]) 7588   type 2  . Drug-induced acute pancreatitis - 6MP 09/07/2015  . Erectile dysfunction   . Hypertension   . Left sided ulcerative colitis ([place])   . Spinal stenosis of lumbar region at multiple levels    Past Surgical History:  Procedure Laterality Date  . COLONOSCOPY     multiple  .  KNEE ARTHROSCOPY Right   . SIGMOIDOSCOPY     Social History   Social History Narrative   Single never married. 1 son 32 in 2018. 1 grandson 37 in 2018. Lives in [place] alone      Disability- low back issues   Roll up machine operator      Hobbies: enjoys tinkering in his shed, enjoys going to the gym   family history includes Diabetes in his mother and another family member; Diabetes type I in his son; Hypertension in an other family member.   Review of Systems  See HPI Objective:   Physical Exam BP (!) 156/88   Pulse 88   Temp 97.7 F (36.5 C) (Oral)   Ht 5' 11"   (1.803 m)   Wt 251 lb (113.9 kg)   BMI 35.01 kg/m  Lungs clear Heart sounds are normal The abdomen is somewhat obese soft and nontender there is a small soft umbilical hernia He is alert and oriented x3 and has an appropriate mood and affect

## 2019-01-08 NOTE — Patient Instructions (Signed)
    Glad things are going well with the colitis.  We will contact you around November of this year to do a colonoscopy.  Let me know if questions or problems before then.  I appreciate the opportunity to care for you. Gatha Mayer, MD, [facility]

## 2019-01-19 NOTE — Patient Instructions (Addendum)
Health Maintenance Due  Topic Date Due  . OPHTHALMOLOGY EXAM - [facility]-we are requesting records 11/02/2017  . COLONOSCOPY 08/06/2013 with Dr. Carlean Purl 08/07/2018  . INFLUENZA VACCINE We should have flu shots available by September. Please strongly consider getting flu shot this year. If you get your flu shot at a pharmacy- please let us know.   01/03/2019   Only change today- add amlodipine 2.71m and follow up 1 month- continue to monitor blood pressure- can bring cuff with you to next visit if you would like.    Please stop by lab before you go If you do not have mychart- we will call you about results within 5 business days of u[place]ceiving them.  If you have mychart- we will send your results within 3 business days of u[place]oreareceiving them.  If abnormal or we want to clarify a result, we will call or mychart you to make sure you receive the message.  If you have questions or concerns or don't hear within 5-7 days, please send u[place]aa message or call u[place]

## 2019-01-19 NOTE — Progress Notes (Signed)
Phone [phone]   Subjective:  Angel Frazier is a 63 y.o. year old very pleasant male patient who presents for/with See problem oriented charting Chief Complaint  Patient presents with  . Follow-up    Fasting today.   Marland Kitchen Hospitalization Follow-up    AKI, Rhabdomyolysis  . Diabetes  . Ulcerative Colitis  . Vitamin D Deficiency  . Hyperlipidemia  . Pulmonary Nodule  . Avascular Necrosis  . Hypertension   ROS- No chest pain or shortness of breath. No headache or blurry vision.     Past Medical History-  Patient Active Problem List   Diagnosis Date Noted  . Long-term use of immunosuppressant medication Humira 07/12/2015    Priority: High  . LEFT SIDED ULCERATIVE COLITIS 08/11/2008    Priority: High  . Pulmonary nodules 09/07/2015    Priority: Medium  . Long term current use of systemic steroids 11/18/2014    Priority: Low  . Avascular necrosis of femoral heads 11/18/2014    Priority: Low  . Vitamin D deficiency 01/24/2013    Priority: Low  . ED (erectile dysfunction) 05/21/2012    Priority: Low  . AKI (acute kidney injury) ([place]) 12/25/2018  . Rhabdomyolysis 12/25/2018  . Diabetes ([place]) 12/22/2018    Medications- reviewed and updated Current Outpatient Medications  Medication Sig Dispense Refill  . ACCU-CHEK AVIVA PLUS test strip TEST 4 TIMES A DAY AS DIRECTED BY PRESCIBER (Patient taking differently: 1 each by Other route 4 (four) times daily. ) 350 each 0  . ACCU-CHEK SOFTCLIX LANCETS lancets TEST 4 TIMES A DAY 300 each 0  . Adalimumab (HUMIRA PEN) 40 MG/0.4ML PNKT Inject 40 mg into the skin every 14 (fourteen) days. 2 each 6  . Ascorbic Acid (VITAMIN C) 1000 MG tablet Take 1,000 mg by mouth daily. Reported on 09/28/2015    . aspirin 81 MG tablet Take 81 mg by mouth daily. Reported on 09/28/2015    . atorvastatin (LIPITOR) 20 MG tablet Take 1 tablet (20 mg total) by mouth once a week. (Patient taking differently: Take 20 mg by mouth every Sunday. ) 90 tablet 3  .  blood glucose meter kit and supplies Dispense based on patient and insurance preference. Use up to four times daily as directed. (FOR ICD-9 250.00, 250.01). 1 each 0  . Calcium Carbonate-Vitamin D (CALCIUM-VITAMIN D3 PO) Take 1 tablet by mouth daily. Reported on 09/28/2015    . Cholecalciferol (VITAMIN D-3 PO) Take 1 capsule by mouth daily with breakfast.    . mesalamine (LIALDA) 1.2 g EC tablet Take 2 tablets (2.4 g total) by mouth 2 (two) times daily. 120 tablet 11  . metFORMIN (GLUCOPHAGE) 1000 MG tablet Take 1 tablet (1,000 mg total) by mouth 2 (two) times daily with a meal. 180 tablet 3  . Multiple Vitamins-Minerals (CENTRUM SILVER PO) Take 1 tablet by mouth daily. Reported on 09/28/2015    . Omega-3 Fatty Acids (FISH OIL) 300 MG CAPS Take 300 mg by mouth daily.     . vitamin B-12 (CYANOCOBALAMIN) 1000 MCG tablet Take 2,500 mcg by mouth daily. Reported on 09/28/2015     No current facility-administered medications for this visit.      Objective:  BP (!) 162/100 (BP Location: Left Arm, Patient Position: Sitting, Cuff Size: Large)   Pulse 76   Temp 98 F (36.7 C) (Oral)   Ht 5' 11"  (1.803 m)   Wt 249 lb 12.8 oz (113.3 kg)   SpO2 96%   BMI 34.84 kg/m  Gen:  NAD, resting comfortably CV: RRR no murmurs rubs or gallops Lungs: CTAB no crackles, wheeze, rhonchi Abdomen: soft/nontender/nondistended/normal bowel sounds. .  Ext: trace edema Skin: warm, dry Neuro: grossly normal, moves all extremities    Assessment and Plan   # Diabetes S:  controlled on Metformin 1000 mg BID- reports recently increased from once a day. Followed by Dr. Loanne Drilling.  CBGs- Fasting BG has been in the low 100s. Denies hypoglycemic episodes.  Exercise and diet- Carbs and sweets in moderation. Not exercising regularly- misses the gym  Lab Results  Component Value Date   HGBA1C 6.7 (H) 12/04/2018   HGBA1C 6.0 (A) 06/23/2018   HGBA1C 5.8 (A) 12/16/2017   A/P: good control- we did required diabetic eye exam (not  on file   # Ulcerative Colitis S:Followed by Dr. Carlean Purl. Taking Humira 40 mg q14days and Lialda 2.4 g BID. Marland Kitchen   A/P: stable- continue current medicines. Dr. Carlean Purl appears to be planning for a colonoscopy 6 months from August 2020  # Vitamin D Deficiency S:Taking OTC Vitamin D supplement daily.   A/P: excellent control in July with vitamin D of 53 ng/ml    #hyperlipidemia S:  controlled on Atorvastatin 20 mg once a week. Also taking Aspirin 81 mg daily and Omega-3.  Lab Results  Component Value Date   CHOL 177 12/04/2018   HDL 84.10 12/04/2018   LDLCALC 81 12/04/2018   LDLDIRECT 65.0 03/17/2018   TRIG 58.0 12/04/2018   CHOLHDL 2 12/04/2018   A/P: reasonable control- continue current medicine.    # Pulmonary Nodule-Followed by Dr. Melvyn Novas, due for f/u 04/2019.    #Hypertension S: Checking BP at home, has been elevated, systolic 678-938, diastolic 10-175. Denies HA, dizziness, CP, SOB, visual changes. Watching salt intake, avoiding fast foods and processed foods.  BP Readings from Last 3 Encounters:  01/20/19 (!) 162/100  01/08/19 (!) 156/88  12/26/18 122/82  A/P: new diagnosis with several Bps over last year >140/90 plus home readings consistent with hypertension. Add amlodipine 2.5 mg and follow up in 1 month.  -hes worried about ED- could try other options like hctz (but would want to make sure kidneys fully recovered of course)  - watch edema- does wear compression stockings at times  # Hospital follow-up     AKI     Rhabdomyolysis S:Admitted 12/25/18-12/26/18. Presented to the ED with complaint of near syncope and cramping in the arm and wrist. Sx started at work while doing yard work. Reported that he was trying to stay hydrated throughout the day drinking several bottles of Gatorade. 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 Pt was found to be hypotensive, 80/60, by EMS. He was cool and diaphoretic. He  denies fever, chills, CP, SOB, abd pain, n/v/d. At the hospital he was given IV fluids and advised to f/u with PCP to have BMP, magnesium, and CK rechecked. He was also advised to follow a carb modified diet.  Had elevated CK level close to 3000/mild rhabdomyolysis.  This trended down before discharge.  Had acute kidney injury with creatinine up to 2.49 from baseline around 1-improved with hydration.  No further work-up for syncopal event is thought related to dehydration.   No recurrent episodes since being home A/P: no recurrence - doing well- will plan to simply repeat labs and continue to avoid overexertion/dehydratoin. Hopefully aki and rhabdomyolysis fully resolved with normal levels today  Recommended follow up: 1 month  blood pressure follow up Future Appointments  Date Time Provider [place]  02/23/2019  9:00 AM Renato Shin, MD [id number] None    Lab/Order associations: No diagnosis found.  No orders of the defined types were placed in this encounter.   Return precautions advised.  [name], MD

## 2019-01-20 ENCOUNTER — Ambulatory Visit (INDEPENDENT_AMBULATORY_CARE_PROVIDER_SITE_OTHER): Payer: Medicare Other | Admitting: Family Medicine

## 2019-01-20 ENCOUNTER — Encounter: Payer: Self-pay | Admitting: Family Medicine

## 2019-01-20 ENCOUNTER — Other Ambulatory Visit: Payer: Self-pay

## 2019-01-20 VITALS — BP 162/100 | HR 76 | Temp 98.0°F | Ht 71.0 in | Wt 249.8 lb

## 2019-01-20 DIAGNOSIS — E1169 Type 2 diabetes mellitus with other specified complication: Secondary | ICD-10-CM | POA: Diagnosis not present

## 2019-01-20 DIAGNOSIS — R748 Abnormal levels of other serum enzymes: Secondary | ICD-10-CM | POA: Diagnosis not present

## 2019-01-20 DIAGNOSIS — K5150 Left sided colitis without complications: Secondary | ICD-10-CM

## 2019-01-20 DIAGNOSIS — E1159 Type 2 diabetes mellitus with other circulatory complications: Secondary | ICD-10-CM | POA: Diagnosis not present

## 2019-01-20 DIAGNOSIS — E119 Type 2 diabetes mellitus without complications: Secondary | ICD-10-CM

## 2019-01-20 DIAGNOSIS — E785 Hyperlipidemia, unspecified: Secondary | ICD-10-CM

## 2019-01-20 DIAGNOSIS — I152 Hypertension secondary to endocrine disorders: Secondary | ICD-10-CM

## 2019-01-20 DIAGNOSIS — E860 Dehydration: Secondary | ICD-10-CM

## 2019-01-20 DIAGNOSIS — N179 Acute kidney failure, unspecified: Secondary | ICD-10-CM | POA: Diagnosis not present

## 2019-01-20 DIAGNOSIS — I10 Essential (primary) hypertension: Secondary | ICD-10-CM | POA: Insufficient documentation

## 2019-01-20 DIAGNOSIS — M6282 Rhabdomyolysis: Secondary | ICD-10-CM

## 2019-01-20 LAB — COMPREHENSIVE METABOLIC PANEL
ALT: 16 U/L (ref 0–53)
AST: 17 U/L (ref 0–37)
Albumin: 4.3 g/dL (ref 3.5–5.2)
Alkaline Phosphatase: 32 U/L — ABNORMAL LOW (ref 39–117)
BUN: 13 mg/dL (ref 6–23)
CO2: 27 mEq/L (ref 19–32)
Calcium: 9.3 mg/dL (ref 8.4–10.5)
Chloride: 104 mEq/L (ref 96–112)
Creatinine, Ser: 0.82 mg/dL (ref 0.40–1.50)
GFR: 114.16 mL/min (ref >60.00)
Glucose, Bld: 103 mg/dL — ABNORMAL HIGH (ref 70–99)
Potassium: 3.7 mEq/L (ref 3.5–5.1)
Sodium: 140 mEq/L (ref 135–145)
Total Bilirubin: 0.7 mg/dL (ref 0.2–1.2)
Total Protein: 7.1 g/dL (ref 6.0–8.3)

## 2019-01-20 LAB — MAGNESIUM: Magnesium: 1.3 mg/dL — ABNORMAL LOW (ref 1.5–2.5)

## 2019-01-20 LAB — CK: Total CK: 507 U/L — ABNORMAL HIGH (ref 7–232)

## 2019-01-20 MED ORDER — AMLODIPINE BESYLATE 2.5 MG PO TABS: 2.5000 mg | ORAL_TABLET | Freq: Every day | ORAL | 3 refills | Status: DC

## 2019-01-20 MED ORDER — MAGNESIUM CHLORIDE 64 MG PO TBEC: 1.0000 | DELAYED_RELEASE_TABLET | Freq: Every day | ORAL | 0 refills | Status: DC

## 2019-01-20 NOTE — Addendum Note (Signed)
Addended by: [name] on: 01/20/2019 01:00 PM   Modules accepted: Orders

## 2019-01-28 ENCOUNTER — Encounter: Payer: Self-pay | Admitting: Family Medicine

## 2019-02-11 ENCOUNTER — Ambulatory Visit: Payer: Self-pay | Admitting: Family Medicine

## 2019-02-11 NOTE — Telephone Encounter (Signed)
I called and spoke with the patient. Due to his schedule he asked to be seen Monday. I scheduled him with [facility] for a 40 min appointment at 8:20, Dr. Yong Channel advised that he is unable to work patient in this week. I advised that the clinical team may call to discuss further this week.

## 2019-02-11 NOTE — Telephone Encounter (Signed)
Pt reports rash on hands and feet. States "May be from medication, started about 5 days after taking Norvasc." Started Norvasc 01/21/2019. States noted "Few pimple like areas on hands, then recently on feet." Reports "Tiny like pimples, dry up then my skin peels, gets flaky." 1 spot on right hand near thumb, several on left hand, knuckles and palm of hand. Feet with several on sides of feet. States itchy, has been applying alcohol "To help with the itch." Also reports mild edema of feet and hands in mornings only, "Goes down during day."    Reports BP today 138/97 HR 97. States fluctuates;Tuesday was 142/106, before meds. Did not check after meds.  Advised pt practice was presently closed. Assured [place] would route to practice for Dr. Ansel Bong review.  Advised to stop applying alcohol to skin. Pt states he does have hydrocortisone cream at home, advised to use until he hears  from Dr. Yong Channel. Advised not to use longer than 1 week. Pt verbalizes understanding.  Please advise: CB# [phone] Reason for Disposition . Mild localized rash  Answer Assessment - Initial Assessment Questions 1. APPEARANCE of RASH: "Describe the rash."      Red "Pimple like" tiny 2. LOCATION: "Where is the rash located?"      A few on hands and feet 3. NUMBER: "How many spots are there?"      2-4 on feet   1 on right hand, few on left hand" 4. SIZE: "How big are the spots?" (Inches, centimeters or compare to size of a coin)      Tiny like a pimple 5. ONSET: "When did the rash start?"      2 1/2 weeks ago. About 5 days after starting Norvasc 6. ITCHING: "Does the rash itch?" If so, ask: "How bad is the itch?"  (Scale 1-10; or mild, moderate, severe)    moderate 7. PAIN: "Does the rash hurt?" If so, ask: "How bad is the pain?"  (Scale 1-10; or mild, moderate, severe)     no 8. OTHER SYMPTOMS: "Do you have any other symptoms?" (e.g., fever)     Mild swelling of hands and feet, "In mornings"  Protocols used: RASH OR  REDNESS - LOCALIZED-A-AH

## 2019-02-16 ENCOUNTER — Encounter: Payer: Self-pay | Admitting: Physician Assistant

## 2019-02-16 ENCOUNTER — Ambulatory Visit (INDEPENDENT_AMBULATORY_CARE_PROVIDER_SITE_OTHER): Payer: Medicare Other | Admitting: Physician Assistant

## 2019-02-16 ENCOUNTER — Other Ambulatory Visit: Payer: Self-pay

## 2019-02-16 ENCOUNTER — Telehealth: Payer: Self-pay | Admitting: Internal Medicine

## 2019-02-16 VITALS — BP 128/88 | HR 74 | Temp 97.8°F | Ht 71.0 in | Wt 248.2 lb

## 2019-02-16 DIAGNOSIS — R21 Rash and other nonspecific skin eruption: Secondary | ICD-10-CM

## 2019-02-16 LAB — COMPREHENSIVE METABOLIC PANEL
ALT: 21 U/L (ref 0–53)
AST: 21 U/L (ref 0–37)
Albumin: 4.2 g/dL (ref 3.5–5.2)
Alkaline Phosphatase: 38 U/L — ABNORMAL LOW (ref 39–117)
BUN: 17 mg/dL (ref 6–23)
CO2: 23 mEq/L (ref 19–32)
Calcium: 9.7 mg/dL (ref 8.4–10.5)
Chloride: 105 mEq/L (ref 96–112)
Creatinine, Ser: 0.82 mg/dL (ref 0.40–1.50)
GFR: 114.13 mL/min (ref >60.00)
Glucose, Bld: 111 mg/dL — ABNORMAL HIGH (ref 70–99)
Potassium: 3.8 mEq/L (ref 3.5–5.1)
Sodium: 139 mEq/L (ref 135–145)
Total Bilirubin: 0.6 mg/dL (ref 0.2–1.2)
Total Protein: 7 g/dL (ref 6.0–8.3)

## 2019-02-16 LAB — CBC WITH DIFFERENTIAL/PLATELET
Basophils Absolute: 0 10*3/uL (ref 0.0–0.1)
Basophils Relative: 0.7 % (ref 0.0–3.0)
Eosinophils Absolute: 0.2 10*3/uL (ref 0.0–0.7)
Eosinophils Relative: 3.4 % (ref 0.0–5.0)
HCT: 40.2 % (ref 39.0–52.0)
Hemoglobin: 13.6 g/dL (ref 13.0–17.0)
Lymphocytes Relative: 25.3 % (ref 12.0–46.0)
Lymphs Abs: 1.4 10*3/uL (ref 0.7–4.0)
MCHC: 33.9 g/dL (ref 30.0–36.0)
MCV: 94.5 fl (ref 78.0–100.0)
Monocytes Absolute: 0.6 10*3/uL (ref 0.1–1.0)
Monocytes Relative: 10.7 % (ref 3.0–12.0)
Neutro Abs: 3.3 10*3/uL (ref 1.4–7.7)
Neutrophils Relative %: 59.9 % (ref 43.0–77.0)
Platelets: 208 10*3/uL (ref 150.0–400.0)
RBC: 4.25 Mil/uL (ref 4.22–5.81)
RDW: 12.9 % (ref 11.5–15.5)
WBC: 5.5 10*3/uL (ref 4.0–10.5)

## 2019-02-16 MED ORDER — TRIAMCINOLONE ACETONIDE 0.1 % EX CREA: TOPICAL_CREAM | CUTANEOUS | 0 refills | Status: DC

## 2019-02-16 NOTE — Patient Instructions (Addendum)
It was great to see you!  1. Stop Norvasc (also called Amlodipine). Let's give it time to get it out of your system before we restart something. We will discuss a new medication to trial at follow-up. 2. We will contact you with lab results. 3. I will contact Dr. Carlean Purl and Dr. Loanne Drilling about this and let you know if they have any feedback. 4. May use the triamcinolone ointment to your hands and feet.  If you develop fever, nausea, vomiting, severe pain, or any changes -- PLEASE let us know ASAP.  Let's follow-up in 10-14 days with me or Dr. Yong Channel, sooner if you have concerns.  Take care,  Inda Coke PA-C

## 2019-02-16 NOTE — Progress Notes (Signed)
Angel Frazier is a 63 y.o. male here for a new problem.  I acted as a Education administrator for [employer], PA-C Anselmo Pickler, LPN  History of Present Illness:   Chief Complaint  Patient presents with  . Rash    HPI   Rash Pt c/o rash on hands x 3 weeks, and 1 week ago the rash has spread to his feet.  The area is blistery, itches at time, and has a pustule appearance.  He states that there is never any discharge, rather the areas will just dry crack open.  He was using a hand sanitizer that he thought was may be contributing to this, but even after he stopped use his symptoms did not improve and also the rash has spread to his feet.  He denies any blisters in his mouth, contact with anyone with hand-foot-and-mouth disease.  He did try some topical over-the-counter hydrocortisone cream without relief.  He is diabetic.  He is also on Humira for ulcerative colitis.  He states that he noticed that he developed the rash soon after starting 2.5 mg of Norvasc.  He continues to take this medication.  BP Readings from Last 3 Encounters:  02/16/19 128/88  01/20/19 (!) 162/100  01/08/19 (!) 156/88   He also states that the other medication change was his pioglitazone-metformin was changed to 1000 mg metformin twice daily.  This was done in July.  He denies --fever, chills, nausea, vomiting, any recent woods exposure or tick borne illness.  Past Medical History:  Diagnosis Date  .  vitamin D deficiency 01/24/2013   01/23/13 - level 29 - supplements 50k IU weekly x 6 recommended   . Anabolic steroid abuse   . Avascular necrosis of femoral head (HCC)    bilateral  . Bleeding hemorrhoids   . Colitis 08/06/2013   Mildly actibr colitis  . Diabetes mellitus without complication ([place]) 0092   type 2  . Drug-induced acute pancreatitis - 6MP 09/07/2015  . Erectile dysfunction   . Hypertension   . Left sided ulcerative colitis ([place])   . Spinal stenosis of lumbar region at multiple levels      Social  History   Socioeconomic History  . Marital status: Single    Spouse name: Not on file  . Number of children: 1  . Years of education: Not on file  . Highest education level: Not on file  Occupational History  . Occupation: retired    Fish farm manager: [employer]  . Financial resource strain: Not on file  . Food insecurity    Worry: Not on file    Inability: Not on file  . Transportation needs    Medical: Not on file    Non-medical: Not on file  Tobacco Use  . Smoking status: Former Smoker    Packs/day: 1.00    Years: 6.00    Pack years: 6.00    Types: Cigarettes    Quit date: 06/04/2000    Years since quitting: 18.7  . Smokeless tobacco: Never Used  Substance and Sexual Activity  . Alcohol use: Yes    Comment: occasionally  . Drug use: No  . Sexual activity: Not on file  Lifestyle  . Physical activity    Days per week: Not on file    Minutes per session: Not on file  . Stress: Not on file  Relationships  . Social Herbalist on phone: Not on file    Gets together:  Not on file    Attends religious service: Not on file    Active member of club or organization: Not on file    Attends meetings of clubs or organizations: Not on file    Relationship status: Not on file  . Intimate partner violence    Fear of current or ex partner: Not on file    Emotionally abused: Not on file    Physically abused: Not on file    Forced sexual activity: Not on file  Other Topics Concern  . Not on file  Social History Narrative   Single never married. 1 son 46 in 2018. 1 grandson 14 in 2018. 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 COLONOSCOPY     multiple  . KNEE ARTHROSCOPY Right   . SIGMOIDOSCOPY      Family History  Problem Relation Age of Onset  . Diabetes Mother   .  Diabetes Other        cousin  . Hypertension Other   . Diabetes type I Son   . Colon cancer Neg Hx   . Esophageal cancer Neg Hx   . Rectal cancer Neg Hx   . Stomach cancer Neg Hx     Allergies  Allergen Reactions  . Mercaptopurine Other (See Comments)    Caused Pancreatitis    Current Medications:   Current Outpatient Medications:  .  ACCU-CHEK AVIVA PLUS test strip, TEST 4 TIMES A DAY AS DIRECTED BY PRESCIBER (Patient taking differently: 1 each by Other route 4 (four) times daily. ), Disp: 350 each, Rfl: 0 .  ACCU-CHEK SOFTCLIX LANCETS lancets, TEST 4 TIMES A DAY, Disp: 300 each, Rfl: 0 .  Adalimumab (HUMIRA PEN) 40 MG/0.4ML PNKT, Inject 40 mg into the skin every 14 (fourteen) days., Disp: 2 each, Rfl: 6 .  amLODipine (NORVASC) 2.5 MG tablet, Take 1 tablet (2.5 mg total) by mouth daily., Disp: 90 tablet, Rfl: 3 .  Ascorbic Acid (VITAMIN C) 1000 MG tablet, Take 1,000 mg by mouth daily. Reported on 09/28/2015, Disp: , Rfl:  .  aspirin 81 MG tablet, Take 81 mg by mouth daily. Reported on 09/28/2015, Disp: , Rfl:  .  atorvastatin (LIPITOR) 20 MG tablet, Take 1 tablet (20 mg total) by mouth once a week. (Patient taking differently: Take 20 mg by mouth every Sunday. ), Disp: 90 tablet, Rfl: 3 .  blood glucose meter kit and supplies, Dispense based on patient and insurance preference. Use up to four times daily as directed. (FOR ICD-9 250.00, 250.01)., Disp: 1 each, Rfl: 0 .  Calcium Carbonate-Vitamin D (CALCIUM-VITAMIN D3 PO), Take 1 tablet by mouth daily. Reported on 09/28/2015, Disp: , Rfl:  .  Cholecalciferol (VITAMIN D-3 PO), Take 1 capsule by mouth daily with breakfast., Disp: , Rfl:  .  magnesium chloride (SLOW-MAG) 64 MG TBEC SR tablet, Take 1 tablet (64 mg total) by mouth daily., Disp: 5 tablet, Rfl: 0 .  mesalamine (LIALDA) 1.2 g EC tablet, Take 2 tablets (2.4 g total) by mouth 2 (two) times daily., Disp: 120 tablet, Rfl: 11 .  metFORMIN (GLUCOPHAGE) 1000 MG tablet, Take 1 tablet  (1,000 mg total) by mouth 2 (two) times daily with a meal., Disp: 180 tablet, Rfl: 3 .  Multiple Vitamins-Minerals (  CENTRUM SILVER PO), Take 1 tablet by mouth daily. Reported on 09/28/2015, Disp: , Rfl:  .  Omega-3 Fatty Acids (FISH OIL) 300 MG CAPS, Take 300 mg by mouth daily. , Disp: , Rfl:  .  vitamin B-12 (CYANOCOBALAMIN) 1000 MCG tablet, Take 2,500 mcg by mouth daily. Reported on 09/28/2015, Disp: , Rfl:  .  triamcinolone cream (KENALOG) 0.1 %, Apply to affected area 1-2 times daily, Disp: 30 g, Rfl: 0   Review of Systems:   ROS Negative unless otherwise specified per HPI.  Vitals:   Vitals:   02/16/19 0833  BP: 128/88  Pulse: 74  Temp: 97.8 F (36.6 C)  TempSrc: Temporal  SpO2: 97%  Weight: 248 lb 4 oz (112.6 kg)  Height: 5' 11"  (1.803 m)     Body mass index is 34.62 kg/m.  Physical Exam:   Physical Exam Vitals signs and nursing note reviewed.  Constitutional:      Appearance: He is well-developed.  HENT:     Head: Normocephalic.     Mouth/Throat:     Comments: No lesions in mouth Eyes:     Conjunctiva/sclera: Conjunctivae normal.     Pupils: Pupils are equal, round, and reactive to light.  Neck:     Musculoskeletal: Normal range of motion.  Pulmonary:     Effort: Pulmonary effort is normal.  Musculoskeletal: Normal range of motion.  Skin:    General: Skin is warm and dry.     Comments: Multiple areas of pustules on bilateral hands, no tenderness or active drainage.  Does appear in clusters in certain areas such as joints and wrists.  Also has cluster of same rash on medial aspect of right foot, and small areas on dorsal surface of left foot.  Neurological:     Mental Status: He is alert and oriented to person, place, and time.  Psychiatric:        Behavior: Behavior normal.        Thought Content: Thought content normal.        Judgment: Judgment normal.             Assessment and Plan:   Angel Frazier was seen today for rash.  Diagnoses and all orders  for this visit:  Rash and nonspecific skin eruption Patient was also reviewed with PCP Dr. [name] in office today.  Cannot rule out drug adverse effect, so we will stop Norvasc today.  To avoid for further potential drug adverse effect, we will wait to restart a new blood pressure medication until follow-up in 10 to 14 days.  Will check routine labs, as well as obtain RPR.  I will also reach out to both Dr. Carlean Purl, who manages patient's Humira, and Dr. Loanne Drilling, who manages his diabetes medications, per patient's request.  We will have him follow-up in 10 to 14 days in our office, with plans for a punch biopsy if any worsening or continued symptoms.  We also did prescribe topical triamcinolone for him to use in the interim.  Worsening precautions were advised. -     CBC with Differential/Platelet -     Comprehensive metabolic panel -     RPR  Other orders -     triamcinolone cream (KENALOG) 0.1 %; Apply to affected area 1-2 times daily  . Reviewed expectations re: course of current medical issues. . Discussed self-management of symptoms. . Outlined signs and symptoms indicating need for more acute intervention. . Patient verbalized understanding and all questions were answered. Marland Kitchen See  orders for this visit as documented in the electronic medical record. . Patient received an After-Visit Summary.  CMA or LPN served as scribe during this visit. History, Physical, and Plan performed by medical provider. The above documentation has been reviewed and is accurate and complete.   Inda Coke, PA-C

## 2019-02-16 NOTE — Telephone Encounter (Signed)
Called about rash he saw PCP ofc for today - working dx is rash from Norvasc  He is on Humira  Will follow along and told him to call me back w/ any ?'s  Checking RPR smart to do

## 2019-02-17 ENCOUNTER — Telehealth: Payer: Self-pay

## 2019-02-17 LAB — RPR: RPR Ser Ql: NONREACTIVE

## 2019-02-17 NOTE — Telephone Encounter (Signed)
Copied from [place] [id number]. Topic: Quick Sport and exercise psychologist Patient (Clinic Use ONLY) >> Feb 17, 2019  2:32 PM Pauline Good wrote: Reason for CRM: pt returning call to [facility]

## 2019-02-17 NOTE — Telephone Encounter (Signed)
See result notes. 

## 2019-02-19 ENCOUNTER — Other Ambulatory Visit: Payer: Self-pay

## 2019-02-20 ENCOUNTER — Other Ambulatory Visit: Payer: Self-pay

## 2019-02-23 ENCOUNTER — Ambulatory Visit (INDEPENDENT_AMBULATORY_CARE_PROVIDER_SITE_OTHER): Payer: Medicare Other | Admitting: Endocrinology

## 2019-02-23 ENCOUNTER — Other Ambulatory Visit: Payer: Self-pay

## 2019-02-23 ENCOUNTER — Encounter: Payer: Self-pay | Admitting: Endocrinology

## 2019-02-23 VITALS — BP 160/90 | HR 78 | Ht 71.0 in | Wt 246.6 lb

## 2019-02-23 DIAGNOSIS — E109 Type 1 diabetes mellitus without complications: Secondary | ICD-10-CM | POA: Diagnosis not present

## 2019-02-23 DIAGNOSIS — R609 Edema, unspecified: Secondary | ICD-10-CM | POA: Diagnosis not present

## 2019-02-23 DIAGNOSIS — I10 Essential (primary) hypertension: Secondary | ICD-10-CM

## 2019-02-23 DIAGNOSIS — E119 Type 2 diabetes mellitus without complications: Secondary | ICD-10-CM

## 2019-02-23 LAB — POCT GLYCOSYLATED HEMOGLOBIN (HGB A1C): Hemoglobin A1C: 5.9 % — AB (ref 4.0–5.6)

## 2019-02-23 NOTE — Patient Instructions (Addendum)
Your blood pressure is high today.  Please see Dr Yong Channel soon, as scheduled.   check your blood sugar once a day.  vary the time of day when you check, between before the 3 meals, and at bedtime.  also check if you have symptoms of your blood sugar being too high or too low.  please keep a record of the readings and bring it to your next appointment here (or you can bring the meter itself).  You can write it on any piece of paper.  please call us sooner if your blood sugar goes below 70, or if you have a lot of readings over 200.  Please continue the same metformin.    Please come back for a follow-up appointment in 3-4 months.

## 2019-02-23 NOTE — Progress Notes (Signed)
Subjective:    Patient ID: Angel Frazier, male    DOB: [birth year], 63 y.o.   MRN: [id number]  HPI Pt returns for f/u of diabetes mellitus: DM type: 1, in remission (AKA atypical DM, intermittently ketosis-prone DM, etc).   Dx'ed: 2355 Complications: none.   Therapy: metformin.    DKA: once, at dx Severe hypoglycemia: never. Pancreatitis: never Pancreatic imaging: normal on 2017 CT Other: he has been in remission 2017-2020. He took insulin for a brief time after dx; edema limits rx options.    Interval history: pt says cbg's are well-controlled.  pt states he feels well in general.   Past Medical History:  Diagnosis Date  .  vitamin D deficiency 01/24/2013   01/23/13 - level 29 - supplements 50k IU weekly x 6 recommended   . Anabolic steroid abuse   . Avascular necrosis of femoral head (HCC)    bilateral  . Bleeding hemorrhoids   . Colitis 08/06/2013   Mildly actibr colitis  . Diabetes mellitus without complication ([place]) 7322   type 2  . Drug-induced acute pancreatitis - 6MP 09/07/2015  . Erectile dysfunction   . Hypertension   . Left sided ulcerative colitis ([place])   . Spinal stenosis of lumbar region at multiple levels     Past Surgical History:  Procedure Laterality Date  . COLONOSCOPY     multiple  . KNEE ARTHROSCOPY Right   . SIGMOIDOSCOPY      Social History   Socioeconomic History  . Marital status: Single    Spouse name: Not on file  . Number of children: 1  . Years of education: Not on file  . Highest education level: Not on file  Occupational History  . Occupation: retired    Fish farm manager: [employer]  . Financial resource strain: Not on file  . Food insecurity    Worry: Not on file    Inability: Not on file  . Transportation needs    Medical: Not on file    Non-medical: Not on file  Tobacco Use  . Smoking status: Former Smoker    Packs/day: 1.00    Years: 6.00    Pack years: 6.00    Types: Cigarettes    Quit date: 06/04/2000     Years since quitting: 18.7  . Smokeless tobacco: Never Used  Substance and Sexual Activity  . Alcohol use: Yes    Comment: occasionally  . Drug use: No  . Sexual activity: Not on file  Lifestyle  . Physical activity    Days per week: Not on file    Minutes per session: Not on file  . Stress: Not on file  Relationships  . Social Herbalist on phone: Not on file    Gets together: Not on file    Attends religious service: Not on file    Active member of club or organization: Not on file    Attends meetings of clubs or organizations: Not on file    Relationship status: Not on file  . Intimate partner violence    Fear of current or ex partner: Not on file    Emotionally abused: Not on file    Physically abused: Not on file    Forced sexual activity: Not on file  Other Topics Concern  . Not on file  Social History Narrative   Single never married. 1 son 85 in 2018. 1 grandson 59 in 2018. 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 ACCU-CHEK AVIVA PLUS test strip TEST 4 TIMES A DAY AS DIRECTED BY PRESCIBER (Patient taking differently: 1 each by Other route 4 (four) times daily. ) 350 each 0  . ACCU-CHEK SOFTCLIX LANCETS lancets TEST 4 TIMES A DAY 300 each 0  . Adalimumab (HUMIRA PEN) 40 MG/0.4ML PNKT Inject 40 mg into the skin every 14 (fourteen) days. 2 each 6  . amLODipine (NORVASC) 2.5 MG tablet Take 1 tablet (2.5 mg total) by mouth daily. 90 tablet 3  . Ascorbic Acid (VITAMIN C) 1000 MG tablet Take 1,000 mg by mouth daily. Reported on 09/28/2015    . aspirin 81 MG tablet Take 81 mg by mouth daily. Reported on 09/28/2015    . atorvastatin (LIPITOR) 20 MG tablet Take 1 tablet (20 mg total) by mouth once a week. (Patient taking differently: Take 20 mg by mouth  every Sunday. ) 90 tablet 3  . blood glucose meter kit and supplies Dispense based on patient and insurance preference. Use up to four times daily as directed. (FOR ICD-9 250.00, 250.01). 1 each 0  . Calcium Carbonate-Vitamin D (CALCIUM-VITAMIN D3 PO) Take 1 tablet by mouth daily. Reported on 09/28/2015    . Cholecalciferol (VITAMIN D-3 PO) Take 1 capsule by mouth daily with breakfast.    . magnesium chloride (SLOW-MAG) 64 MG TBEC SR tablet Take 1 tablet (64 mg total) by mouth daily. 5 tablet 0  . mesalamine (LIALDA) 1.2 g EC tablet Take 2 tablets (2.4 g total) by mouth 2 (two) times daily. 120 tablet 11  . metFORMIN (GLUCOPHAGE) 1000 MG tablet Take 1 tablet (1,000 mg total) by mouth 2 (two) times daily with a meal. 180 tablet 3  . Multiple Vitamins-Minerals (CENTRUM SILVER PO) Take 1 tablet by mouth daily. Reported on 09/28/2015    . Omega-3 Fatty Acids (FISH OIL) 300 MG CAPS Take 300 mg by mouth daily.     Marland Kitchen triamcinolone cream (KENALOG) 0.1 % Apply to affected area 1-2 times daily 30 g 0  . vitamin B-12 (CYANOCOBALAMIN) 1000 MCG tablet Take 2,500 mcg by mouth daily. Reported on 09/28/2015     No current facility-administered medications on file prior to visit.     Allergies  Allergen Reactions  . Mercaptopurine Other (See Comments)    Caused Pancreatitis    Family History  Problem Relation Age of Onset  . Diabetes Mother   . Diabetes Other        cousin  . Hypertension Other   . Diabetes type I Son   . Colon cancer Neg Hx   . Esophageal cancer Neg Hx   . Rectal cancer Neg Hx   . Stomach cancer Neg Hx     BP (!) 160/90 (BP Location: Left Arm, Patient Position: Sitting, Cuff Size: Large)   Pulse 78   Ht 5' 11"  (1.803 m)   Wt 246 lb 9.6 oz (111.9 kg)   SpO2 96%   BMI 34.39 kg/m    Review of Systems Denies nausea.     Objective:   Physical Exam VITAL SIGNS:  See vs page GENERAL: no distress Pulses: dorsalis pedis intact bilat.   MSK: no deformity of the feet CV: trace  bilat leg  edema.   Skin:  no ulcer on the feet.  normal color and temp on the feet. Neuro: sensation is intact to touch on the feet.     Lab Results  Component Value Date   HGBA1C 5.9 (A) 02/23/2019       Assessment & Plan:  Type 1 DM: still in remission. HTN: is noted today Edema: This may continue to improve off the pioglitazone.    Patient Instructions  Your blood pressure is high today.  Please see Dr Yong Channel soon, as scheduled.   check your blood sugar once a day.  vary the time of day when you check, between before the 3 meals, and at bedtime.  also check if you have symptoms of your blood sugar being too high or too low.  please keep a record of the readings and bring it to your next appointment here (or you can bring the meter itself).  You can write it on any piece of paper.  please call us sooner if your blood sugar goes below 70, or if you have a lot of readings over 200.  Please continue the same metformin.    Please come back for a follow-up appointment in 3-4 months.

## 2019-02-24 ENCOUNTER — Encounter (HOSPITAL_COMMUNITY): Payer: Self-pay

## 2019-02-24 ENCOUNTER — Ambulatory Visit (HOSPITAL_COMMUNITY)
Admission: EM | Admit: 2019-02-24 | Discharge: 2019-02-24 | Disposition: A | Discharge status: Home / Self Care | Payer: Medicare Other | Attending: Urgent Care | Admitting: Urgent Care

## 2019-02-24 DIAGNOSIS — M5030 Other cervical disc degeneration, unspecified cervical region: Secondary | ICD-10-CM | POA: Diagnosis not present

## 2019-02-24 DIAGNOSIS — M542 Cervicalgia: Secondary | ICD-10-CM

## 2019-02-24 DIAGNOSIS — R030 Elevated blood-pressure reading, without diagnosis of hypertension: Secondary | ICD-10-CM

## 2019-02-24 DIAGNOSIS — I10 Essential (primary) hypertension: Secondary | ICD-10-CM

## 2019-02-24 MED ORDER — PREDNISONE 20 MG PO TABS: ORAL_TABLET | ORAL | 0 refills | Status: DC

## 2019-02-24 MED ORDER — CYCLOBENZAPRINE HCL 5 MG PO TABS: 5.0000 mg | ORAL_TABLET | Freq: Every day | ORAL | 0 refills | Status: DC

## 2019-02-24 NOTE — ED Provider Notes (Signed)
MRN: [id number] DOB: Oct 16, 1955  Subjective:   Angel Frazier is a 63 y.o. male presenting for 3-day history acute onset worsening moderate to severe neck pain, worse on the right side with associated stiffness.  Patient has a history of diffuse degenerative disc disease at the cervical level.  Denies any falls, recent trauma, surgical procedures of the neck.  Denies weakness, numbness or tingling, radicular symptoms.  Patient has tried using hot packs without any relief.  He has a history of diabetes, blood sugars are well controlled.  He also has a history of HTN, was recently taken off his blood pressure medicine due to concern of possible allergic reaction.  He is set to contact his PCP today for follow-up on his blood pressure and rash.  He does have an orthopedist but has never previously gotten work done for his neck.  Patient also takes Humira for left-sided ulcerative colitis.  No current facility-administered medications for this encounter.   Current Outpatient Medications:  .  ACCU-CHEK AVIVA PLUS test strip, TEST 4 TIMES A DAY AS DIRECTED BY PRESCIBER (Patient taking differently: 1 each by Other route 4 (four) times daily. ), Disp: 350 each, Rfl: 0 .  ACCU-CHEK SOFTCLIX LANCETS lancets, TEST 4 TIMES A DAY, Disp: 300 each, Rfl: 0 .  Adalimumab (HUMIRA PEN) 40 MG/0.4ML PNKT, Inject 40 mg into the skin every 14 (fourteen) days., Disp: 2 each, Rfl: 6 .  amLODipine (NORVASC) 2.5 MG tablet, Take 1 tablet (2.5 mg total) by mouth daily., Disp: 90 tablet, Rfl: 3 .  Ascorbic Acid (VITAMIN C) 1000 MG tablet, Take 1,000 mg by mouth daily. Reported on 09/28/2015, Disp: , Rfl:  .  aspirin 81 MG tablet, Take 81 mg by mouth daily. Reported on 09/28/2015, Disp: , Rfl:  .  atorvastatin (LIPITOR) 20 MG tablet, Take 1 tablet (20 mg total) by mouth once a week. (Patient taking differently: Take 20 mg by mouth every Sunday. ), Disp: 90 tablet, Rfl: 3 .  blood glucose meter kit and supplies, Dispense based on  patient and insurance preference. Use up to four times daily as directed. (FOR ICD-9 250.00, 250.01)., Disp: 1 each, Rfl: 0 .  Calcium Carbonate-Vitamin D (CALCIUM-VITAMIN D3 PO), Take 1 tablet by mouth daily. Reported on 09/28/2015, Disp: , Rfl:  .  Cholecalciferol (VITAMIN D-3 PO), Take 1 capsule by mouth daily with breakfast., Disp: , Rfl:  .  magnesium chloride (SLOW-MAG) 64 MG TBEC SR tablet, Take 1 tablet (64 mg total) by mouth daily., Disp: 5 tablet, Rfl: 0 .  mesalamine (LIALDA) 1.2 g EC tablet, Take 2 tablets (2.4 g total) by mouth 2 (two) times daily., Disp: 120 tablet, Rfl: 11 .  metFORMIN (GLUCOPHAGE) 1000 MG tablet, Take 1 tablet (1,000 mg total) by mouth 2 (two) times daily with a meal., Disp: 180 tablet, Rfl: 3 .  Multiple Vitamins-Minerals (CENTRUM SILVER PO), Take 1 tablet by mouth daily. Reported on 09/28/2015, Disp: , Rfl:  .  Omega-3 Fatty Acids (FISH OIL) 300 MG CAPS, Take 300 mg by mouth daily. , Disp: , Rfl:  .  triamcinolone cream (KENALOG) 0.1 %, Apply to affected area 1-2 times daily, Disp: 30 g, Rfl: 0 .  vitamin B-12 (CYANOCOBALAMIN) 1000 MCG tablet, Take 2,500 mcg by mouth daily. Reported on 09/28/2015, Disp: , Rfl:     Allergies  Allergen Reactions  . Mercaptopurine Other (See Comments)    Caused Pancreatitis    Past Medical History:  Diagnosis Date  .  vitamin D  deficiency 01/24/2013   01/23/13 - level 29 - supplements 50k IU weekly x 6 recommended   . Anabolic steroid abuse   . Avascular necrosis of femoral head (HCC)    bilateral  . Bleeding hemorrhoids   . Colitis 08/06/2013   Mildly actibr colitis  . Diabetes mellitus without complication ([place]) 1914   type 2  . Drug-induced acute pancreatitis - 6MP 09/07/2015  . Erectile dysfunction   . Hypertension   . Left sided ulcerative colitis ([place])   . Spinal stenosis of lumbar region at multiple levels      Past Surgical History:  Procedure Laterality Date  . COLONOSCOPY     multiple  . KNEE ARTHROSCOPY  Right   . SIGMOIDOSCOPY      ROS  Objective:   Vitals: BP (!) 169/110 (BP Location: Left Arm)   Pulse 95   Temp 98 F (36.7 C) (Oral)   Resp 16   SpO2 97%   Physical Exam Constitutional:      Appearance: Normal appearance. He is well-developed and normal weight.  HENT:     Head: Normocephalic and atraumatic.     Right Ear: External ear normal.     Left Ear: External ear normal.     Nose: Nose normal.     Mouth/Throat:     Pharynx: Oropharynx is clear.  Eyes:     Extraocular Movements: Extraocular movements intact.     Pupils: Pupils are equal, round, and reactive to light.  Cardiovascular:     Rate and Rhythm: Normal rate.  Pulmonary:     Effort: Pulmonary effort is normal.  Musculoskeletal:     Cervical back: He exhibits decreased range of motion (Lateral flexion and rotation to the right), tenderness (Along paraspinal muscles of cervical region but not midline) and spasm. He exhibits no bony tenderness, no swelling, no edema, no deformity and no laceration.     Comments: Negative Lhermitte and Spurling maneuver.  Strength 5/5 throughout.  Neurological:     Mental Status: He is alert and oriented to person, place, and time.     Deep Tendon Reflexes: Reflexes normal.  Psychiatric:        Mood and Affect: Mood normal.        Behavior: Behavior normal.     Assessment and Plan :   1. Degenerative disc disease, cervical   2. Neck pain   3. Essential hypertension   4. Elevated blood pressure reading     Will use short steroid course and a muscle relaxant to address his severe neck pain in the setting of diffuse degenerative disc disease of the cervical region.  Patient is to contact his orthopedist for follow-up or establish care with spine specialist, information provided to the patient.  He is to follow-up with his PCP regarding his blood pressure medications and further management. Counseled patient on potential for adverse effects with medications  prescribed/recommended today, ER and return-to-clinic precautions discussed, patient verbalized understanding.    Jaynee Eagles, [place] 02/24/19 1148

## 2019-02-24 NOTE — ED Triage Notes (Signed)
Patient reports having neck pain for 3 days, he used hot compressed.

## 2019-02-25 ENCOUNTER — Other Ambulatory Visit: Payer: Self-pay | Admitting: Physician Assistant

## 2019-02-25 NOTE — Telephone Encounter (Signed)
Pt called to give update on triamcinolone cream (KENALOG) 0.1 % Stated that he is still having some new spots to pop up. He would like to know if [facility] wants to refill his kenalog or if he should wait until his appt with Dr. Yong Channel on 03/04/19. Requesting CB. Please advise.

## 2019-02-25 NOTE — Telephone Encounter (Signed)
Pt saw Aldona Bar 02/16/19 for rash and was prescribed triamcinolone. OK to refill?

## 2019-02-25 NOTE — Telephone Encounter (Signed)
See note

## 2019-02-26 MED ORDER — TRIAMCINOLONE ACETONIDE 0.1 % EX CREA: TOPICAL_CREAM | CUTANEOUS | 0 refills | Status: DC

## 2019-02-26 NOTE — Telephone Encounter (Signed)
May refill-please tell him to not use it beyond 10 days on any individual lesion as can lighten skin tone

## 2019-02-26 NOTE — Telephone Encounter (Signed)
Called pt and left VM to call the office.  

## 2019-02-27 DIAGNOSIS — M519 Unspecified thoracic, thoracolumbar and lumbosacral intervertebral disc disorder: Secondary | ICD-10-CM | POA: Diagnosis not present

## 2019-02-27 DIAGNOSIS — M542 Cervicalgia: Secondary | ICD-10-CM | POA: Diagnosis not present

## 2019-02-27 DIAGNOSIS — I10 Essential (primary) hypertension: Secondary | ICD-10-CM | POA: Diagnosis not present

## 2019-03-02 NOTE — Progress Notes (Signed)
We moved patient up to a sooner visit for his rash.  Pre-charting on this visit had already been started.  Erroneous encounter-LOS

## 2019-03-02 NOTE — Progress Notes (Signed)
Phone [phone]   Subjective:  Angel Frazier is a 63 y.o. year old very pleasant male patient who presents for/with See problem oriented charting Chief Complaint  Patient presents with  . Rash   ROS-no fever/chills/nausea/vomiting.  Rash is not significantly worsening though has noted some new spots on arms and legs  Past Medical History-  Patient Active Problem List   Diagnosis Date Noted  . Diabetes mellitus type II, controlled ([place]) 12/22/2018    Priority: High  . Long-term use of immunosuppressant medication Humira 07/12/2015    Priority: High  . LEFT SIDED ULCERATIVE COLITIS 08/11/2008    Priority: High  . Hyperlipidemia associated with type 2 diabetes mellitus ([place]) 01/20/2019    Priority: Medium  . Hypertension associated with diabetes ([place]) 01/20/2019    Priority: Medium  . Pulmonary nodules 09/07/2015    Priority: Medium  . Long term current use of systemic steroids 11/18/2014    Priority: Low  . Avascular necrosis of femoral heads 11/18/2014    Priority: Low  . Vitamin D deficiency 01/24/2013    Priority: Low  . ED (erectile dysfunction) 05/21/2012    Priority: Low  . AKI (acute kidney injury) ([place]) 12/25/2018  . Rhabdomyolysis 12/25/2018    Medications- reviewed and updated Current Outpatient Medications  Medication Sig Dispense Refill  . ACCU-CHEK AVIVA PLUS test strip TEST 4 TIMES A DAY AS DIRECTED BY PRESCIBER (Patient taking differently: 1 each by Other route 4 (four) times daily. ) 350 each 0  . ACCU-CHEK SOFTCLIX LANCETS lancets TEST 4 TIMES A DAY 300 each 0  . Adalimumab (HUMIRA PEN) 40 MG/0.4ML PNKT Inject 40 mg into the skin every 14 (fourteen) days. 2 each 6  . amLODipine (NORVASC) 2.5 MG tablet Take 1 tablet (2.5 mg total) by mouth daily. 90 tablet 3  . Ascorbic Acid (VITAMIN C) 1000 MG tablet Take 1,000 mg by mouth daily. Reported on 09/28/2015    . aspirin 81 MG tablet Take 81 mg by mouth daily. Reported on 09/28/2015    . atorvastatin (LIPITOR)  20 MG tablet Take 1 tablet (20 mg total) by mouth once a week. (Patient taking differently: Take 20 mg by mouth every Sunday. ) 90 tablet 3  . blood glucose meter kit and supplies Dispense based on patient and insurance preference. Use up to four times daily as directed. (FOR ICD-9 250.00, 250.01). 1 each 0  . Calcium Carbonate-Vitamin D (CALCIUM-VITAMIN D3 PO) Take 1 tablet by mouth daily. Reported on 09/28/2015    . Cholecalciferol (VITAMIN D-3 PO) Take 1 capsule by mouth daily with breakfast.    . cyclobenzaprine (FLEXERIL) 5 MG tablet Take 1 tablet (5 mg total) by mouth at bedtime. 30 tablet 0  . magnesium chloride (SLOW-MAG) 64 MG TBEC SR tablet Take 1 tablet (64 mg total) by mouth daily. 5 tablet 0  . mesalamine (LIALDA) 1.2 g EC tablet Take 2 tablets (2.4 g total) by mouth 2 (two) times daily. 120 tablet 11  . metFORMIN (GLUCOPHAGE) 1000 MG tablet Take 1 tablet (1,000 mg total) by mouth 2 (two) times daily with a meal. 180 tablet 3  . Multiple Vitamins-Minerals (CENTRUM SILVER PO) Take 1 tablet by mouth daily. Reported on 09/28/2015    . Omega-3 Fatty Acids (FISH OIL) 300 MG CAPS Take 300 mg by mouth daily.     Marland Kitchen triamcinolone cream (KENALOG) 0.1 % Apply to affected area 1-2 times daily 30 g 0  . vitamin B-12 (CYANOCOBALAMIN) 1000 MCG tablet Take 2,500  mcg by mouth daily. Reported on 09/28/2015    . predniSONE (DELTASONE) 20 MG tablet Take 2 pills for 3 days, 1 pill for 3 days, 1/2 pill for 3 days, 1/2 pill every other day until finished 12 tablet 0   No current facility-administered medications for this visit.      Objective:  BP 140/90   Pulse 92   Temp 98 F (36.7 C)   Ht 5' 11"  (1.803 m)   Wt 247 lb (112 kg)   SpO2 98%   BMI 34.45 kg/m  Gen: NAD, resting comfortably CV: RRR  Lungs: nonlabored, normal respiratory rate Abdomen: soft/nondistended Ext: no edema Skin: warm, dry, on hands and feet desquamination noted as well as multiple areas of pustules- photographs below.       Pustules photographed above  Skin Biopsy Procedure Note   PRE-OP DIAGNOSIS:  Rash unknown etiology POST-OP DIAGNOSIS: Same  Size of lesion:  Lesion 8 x 8 mm with multiple pustules  Location of lesion on left lower leg/shin PROCEDURE:  4 mm  Punch biopsy  The area surrounding the skin lesion was prepared and draped in the usual sterile manner. The lesion was removed in the usual manner by the biopsy method noted above. Hemostasis was assured. The patient tolerated the procedure well.  Closure:  Silver nitrate for hemostasis and handage  Followup: The patient tolerated the procedure well without complications.  Standard post-procedure care is explained and return precautions are given.     Assessment and Plan  # Rash S:pt is following up on the rash on his hands and feet-last seen by Inda Coke on 02/16/2019. He states that he is not seeing any improvement with the rash. He is still using the triamcinolone cream that was Rx'd and does not feel that it is working.  Patient had a reassuring CBC, CMP, RPR at that time.  He stopped amlodipine 2.5 mg but is not sure if he is seeing any improvement-has had a few new spots on the arms and up onto the shin bilaterally.  Was started on 5 days of prednisone for his neck-not sure if that has made a difference yet A/P: Rash of unknown etiology-stat referral to dermatology with this this desquaminating pustular rash -Punch biopsy was completed today due to concern that it may take extended period to get into dermatology.  Discussed benefits and risks with patient and verbal consent was obtained - He would like to try a longer course of prednisone-sugars seem to have done okay with 5 days and I provided the taper as below-encouraged him to watch blood sugar -We will send a message to myself to check on patient in 3 to 4 weeks-asked patient to update [place] in 2 to 3 weeks with how he is doing  #hypertension S: Poorly controlled on no medication-  blood pressure appears to be improving without medication as he gets further out from acute kidney injury BP Readings from Last 3 Encounters:  03/04/19 140/90  02/24/19 (!) 169/110 acute pain related to neck  02/23/19 (!) 160/90 at endocrinology visit-not clear was rechecked and may have been in pain  A/P: Since blood pressure is only mildly elevated- will monitor until next visit-do not want to introduce another factor given ongoing issues with rash  Recommended follow up: Check on patient in 3 to 4 weeks Future Appointments  Date Time Provider [place]  06/15/2019  9:30 AM Renato Shin, MD [id number] None    Lab/Order associations:   ICD-10-CM  1. Rash and nonspecific skin eruption  R21 Dermatology pathology([place])    Ambulatory referral to Dermatology  2. Hypertension associated with diabetes ([place])  E11.59    I10     Meds ordered this encounter  Medications  . predniSONE (DELTASONE) 20 MG tablet    Sig: Take 2 pills for 3 days, 1 pill for 3 days, 1/2 pill for 3 days, 1/2 pill every other day until finished    Dispense:  12 tablet    Refill:  0    Return precautions advised.  [name], MD

## 2019-03-02 NOTE — Patient Instructions (Signed)
Health Maintenance Due  Topic Date Due  . COLONOSCOPY  08/07/2018   Depression screen [facility] 2/9 01/20/2019 12/04/2018 06/18/2017  Decreased Interest 0 3 0  Down, Depressed, Hopeless 0 0 0  PHQ - 2 Score 0 3 0  Altered sleeping - 0 0  Tired, decreased energy - 0 0  Change in appetite - 0 0  Feeling bad or failure about yourself  - 0 0  Trouble concentrating - 0 0  Moving slowly or fidgety/restless - 0 0  Suicidal thoughts - 0 0  PHQ-9 Score - 3 0  Difficult doing work/chores - Not difficult at all Not difficult at all

## 2019-03-03 NOTE — Patient Instructions (Addendum)
Health Maintenance Due  Topic Date Due  . COLONOSCOPY - please call to schedule with Dr. Carlean Purl. We can update referral if needed.  08/07/2018   Trial prednisone- sent in today- watch sugars on this. Did over 10 day taper.   We will call you within two weeks about your referral to dermatology (hoping to get you in within a month). If you do not hear within 3 weeks, give [place] a call.   You had a biopsy today- leave bandage in place for 24 hours. May then remove and wash with soapy water but not scrub. After bathing, reapply triple antibiotic ointment and bandaid.  If it bleeds- apply pressure up to 30 minutes to an hour- seek care if continues to bleed past that point. If you have expanding redness or worsening pain then let us reevaluate the spot.   We have ordered labs or studies at this visit (pathology of lesion). It can take up to 1 week for results and processing.If you have not heard from [place] or cannot find your results in [facility] in 2 weeks please contact our office at [phone].

## 2019-03-04 ENCOUNTER — Ambulatory Visit (INDEPENDENT_AMBULATORY_CARE_PROVIDER_SITE_OTHER): Payer: Medicare Other | Admitting: Family Medicine

## 2019-03-04 ENCOUNTER — Encounter: Payer: Self-pay | Admitting: Family Medicine

## 2019-03-04 ENCOUNTER — Other Ambulatory Visit: Payer: Self-pay

## 2019-03-04 ENCOUNTER — Other Ambulatory Visit: Payer: Self-pay | Admitting: Family Medicine

## 2019-03-04 VITALS — BP 140/90 | HR 92 | Temp 98.0°F | Ht 71.0 in | Wt 247.0 lb

## 2019-03-04 DIAGNOSIS — I152 Hypertension secondary to endocrine disorders: Secondary | ICD-10-CM

## 2019-03-04 DIAGNOSIS — I10 Essential (primary) hypertension: Secondary | ICD-10-CM | POA: Diagnosis not present

## 2019-03-04 DIAGNOSIS — E1159 Type 2 diabetes mellitus with other circulatory complications: Secondary | ICD-10-CM | POA: Diagnosis not present

## 2019-03-04 DIAGNOSIS — L308 Other specified dermatitis: Secondary | ICD-10-CM | POA: Diagnosis not present

## 2019-03-04 DIAGNOSIS — R21 Rash and other nonspecific skin eruption: Secondary | ICD-10-CM

## 2019-03-04 MED ORDER — PREDNISONE 20 MG PO TABS: ORAL_TABLET | ORAL | 0 refills | Status: DC

## 2019-03-05 ENCOUNTER — Encounter: Payer: Self-pay | Admitting: Internal Medicine

## 2019-03-09 ENCOUNTER — Ambulatory Visit (INDEPENDENT_AMBULATORY_CARE_PROVIDER_SITE_OTHER): Payer: Medicare Other | Admitting: Family Medicine

## 2019-03-10 DIAGNOSIS — M542 Cervicalgia: Secondary | ICD-10-CM | POA: Diagnosis not present

## 2019-03-17 ENCOUNTER — Telehealth: Payer: Self-pay | Admitting: Family Medicine

## 2019-03-17 NOTE — Telephone Encounter (Signed)
Patient calling and states that Dr Yong Channel wanted him to call back in 2-3 weeks for an update. States that his feet and hands still have the rash and are swollen. States that he cannot put shoes on because it hurts. States that he is now off of the prednisone and that he has a dermatology appointment on 04/02/2019. Please advise.

## 2019-03-17 NOTE — Telephone Encounter (Signed)
See note

## 2019-03-17 NOTE — Telephone Encounter (Signed)
FYI

## 2019-03-18 NOTE — Telephone Encounter (Signed)
Some of this could be from the prednisone he was on- if he would like to see one of my colleagues this week while im out of office that is an option as well. Elevation and compression stockings if tolerable can also be helpful

## 2019-03-18 NOTE — Telephone Encounter (Signed)
Called and gave below information to pt. Pt is complaining of the swelling his feet, since he has appointment with derm and you said for them to take it from here, do you want pt to contact derm about the swelling in his feet from the rash?

## 2019-03-18 NOTE — Telephone Encounter (Signed)
Ok thrilled he was able to get into dermatology that soon- let's keep that appointment. Since steroids are not helping I am not going to recommend prolonging the course.   Tell him sorry we have not helped him feel better yet but we are hopeful that dermatology can take it from here and help him.

## 2019-03-18 NOTE — Telephone Encounter (Signed)
Called pt back and pt is going to come into the office to see a colleague.

## 2019-03-19 ENCOUNTER — Ambulatory Visit (INDEPENDENT_AMBULATORY_CARE_PROVIDER_SITE_OTHER): Payer: Medicare Other | Admitting: Family Medicine

## 2019-03-19 ENCOUNTER — Other Ambulatory Visit: Payer: Self-pay

## 2019-03-19 ENCOUNTER — Encounter: Payer: Self-pay | Admitting: Family Medicine

## 2019-03-19 ENCOUNTER — Other Ambulatory Visit: Payer: Self-pay | Admitting: Family Medicine

## 2019-03-19 VITALS — BP 132/88 | HR 95 | Temp 97.1°F | Resp 16 | Ht 71.0 in | Wt 245.4 lb

## 2019-03-19 DIAGNOSIS — M542 Cervicalgia: Secondary | ICD-10-CM | POA: Diagnosis not present

## 2019-03-19 DIAGNOSIS — R21 Rash and other nonspecific skin eruption: Secondary | ICD-10-CM

## 2019-03-19 MED ORDER — TRAMADOL HCL 50 MG PO TABS: 50.0000 mg | ORAL_TABLET | Freq: Three times a day (TID) | ORAL | 0 refills | Status: AC | PRN

## 2019-03-19 MED ORDER — PREDNISONE 20 MG PO TABS: ORAL_TABLET | ORAL | 0 refills | Status: DC

## 2019-03-19 NOTE — Telephone Encounter (Signed)
Medication Refill - Medication: predniSONE (DELTASONE) 20 MG tablet and traMADol (ULTRAM) 50 MG tablet    Preferred Pharmacy (with phone number or street name):  [facility] [id number] - [place], [place] - [address] [phone] (Phone) [phone] (Fax)     Patient is all out of medication.

## 2019-03-19 NOTE — Progress Notes (Signed)
Subjective  CC:  Chief Complaint  Patient presents with  . Foot Swelling    Bilateral feet, started 2-3 week ago.. Reports pain and swelling   Same day acute visit; PCP not available. New pt to me. Chart reviewed.   HPI: Angel Frazier is a 63 y.o. male who presents to the office today to address the problems listed above in the chief complaint.  Here for f/u on worsening rash. I reviewed last 2 visits. Has had erythematous pustule rash on bilateral hands that started in early September; thought may be related to amlodipine which he had just started or to increased use of hand sanitizers. Was treated with topical triamcinolone w/o improvement. Returned for f/u with progressive rash, now on bilateral feet and plaque like with desquamation. Punch biopsy was done at that time and steroid taper initiated. Pt reports rash did not resolve but since stopping the steroids it is progressing again. As well, feet are now swollen and sore. No systemic sxs, no mucosal involvement. No h/o eczema or psoriasis. No arthralgias. No other new medications but he is on humira since march. Last dose was 2 days ago.   Has multiple chronic medical problems. meds reviewed.  Assessment  1. Rash and nonspecific skin eruption      Plan   rash:  ? Side effect of humira, nummular eczema or other. bx was negative for fungal infection which makes id reaction unlikely. Start tramadol as needed and pred burst 60 daily and refer to K. Sheffield, PA who is gracious enough to work him in early next week for further evaluation and treatment recommendations. He is clinically stable.   Follow up: derm 03/24/2019  Visit date not found  Orders Placed This Encounter  Procedures  . Ambulatory referral to Dermatology   Meds ordered this encounter  Medications  . predniSONE (DELTASONE) 20 MG tablet    Sig: Take 3 tabs daily for 5 days    Dispense:  15 tablet    Refill:  0  . traMADol (ULTRAM) 50 MG tablet    Sig: Take 1  tablet (50 mg total) by mouth every 8 (eight) hours as needed for up to 5 days.    Dispense:  15 tablet    Refill:  0      I reviewed the patients updated PMH, FH, and SocHx.    Patient Active Problem List   Diagnosis Date Noted  . Hyperlipidemia associated with type 2 diabetes mellitus ([place]) 01/20/2019  . Hypertension associated with diabetes ([place]) 01/20/2019  . AKI (acute kidney injury) ([place]) 12/25/2018  . Rhabdomyolysis 12/25/2018  . Diabetes mellitus type II, controlled ([place]) 12/22/2018  . Pulmonary nodules 09/07/2015  . Long-term use of immunosuppressant medication Humira 07/12/2015  . Long term current use of systemic steroids 11/18/2014  . Avascular necrosis of femoral heads 11/18/2014  . Vitamin D deficiency 01/24/2013  . ED (erectile dysfunction) 05/21/2012  . LEFT SIDED ULCERATIVE COLITIS 08/11/2008   Current Meds  Medication Sig  . ACCU-CHEK AVIVA PLUS test strip TEST 4 TIMES A DAY AS DIRECTED BY PRESCIBER (Patient taking differently: 1 each by Other route 4 (four) times daily. )  . ACCU-CHEK SOFTCLIX LANCETS lancets TEST 4 TIMES A DAY  . Adalimumab (HUMIRA PEN) 40 MG/0.4ML PNKT Inject 40 mg into the skin every 14 (fourteen) days.  Marland Kitchen amLODipine (NORVASC) 2.5 MG tablet Take 1 tablet (2.5 mg total) by mouth daily.  . Ascorbic Acid (VITAMIN C) 1000 MG tablet Take 1,000 mg  by mouth daily. Reported on 09/28/2015  . aspirin 81 MG tablet Take 81 mg by mouth daily. Reported on 09/28/2015  . atorvastatin (LIPITOR) 20 MG tablet Take 1 tablet (20 mg total) by mouth once a week. (Patient taking differently: Take 20 mg by mouth every Sunday. )  . blood glucose meter kit and supplies Dispense based on patient and insurance preference. Use up to four times daily as directed. (FOR ICD-9 250.00, 250.01).  . Calcium Carbonate-Vitamin D (CALCIUM-VITAMIN D3 PO) Take 1 tablet by mouth daily. Reported on 09/28/2015  . Cholecalciferol (VITAMIN D-3 PO) Take 1 capsule by mouth daily with breakfast.   . cyclobenzaprine (FLEXERIL) 5 MG tablet Take 1 tablet (5 mg total) by mouth at bedtime.  . magnesium chloride (SLOW-MAG) 64 MG TBEC SR tablet Take 1 tablet (64 mg total) by mouth daily.  . mesalamine (LIALDA) 1.2 g EC tablet Take 2 tablets (2.4 g total) by mouth 2 (two) times daily.  . metFORMIN (GLUCOPHAGE) 1000 MG tablet Take 1 tablet (1,000 mg total) by mouth 2 (two) times daily with a meal.  . Multiple Vitamins-Minerals (CENTRUM SILVER PO) Take 1 tablet by mouth daily. Reported on 09/28/2015  . Omega-3 Fatty Acids (FISH OIL) 300 MG CAPS Take 300 mg by mouth daily.   Marland Kitchen triamcinolone cream (KENALOG) 0.1 % Apply to affected area 1-2 times daily  . vitamin B-12 (CYANOCOBALAMIN) 1000 MCG tablet Take 2,500 mcg by mouth daily. Reported on 09/28/2015    Allergies: Patient is allergic to mercaptopurine. Family History: Patient family history includes Diabetes in his mother and another family member; Diabetes type I in his son; Hypertension in an other family member. Social History:  Patient  reports that he quit smoking about 18 years ago. His smoking use included cigarettes. He has a 6.00 pack-year smoking history. He has never used smokeless tobacco. He reports current alcohol use. He reports that he does not use drugs.  Review of Systems: Constitutional: Negative for fever malaise or anorexia Cardiovascular: negative for chest pain Respiratory: negative for SOB or persistent cough Gastrointestinal: negative for abdominal pain  Objective  Vitals: BP 132/88   Pulse 95   Temp (!) 97.1 F (36.2 C) (Tympanic)   Resp 16   Ht 5' 11"  (1.803 m)   Wt 245 lb 6.4 oz (111.3 kg)   SpO2 97%   BMI 34.23 kg/m  General: no acute distress , A&Ox3 HEENT: PEERL, conjunctiva normal, Oropharynx moist,neck is supple Cardiovascular:  RRR without murmur or gallop.  Respiratory:  Good breath sounds bilaterally, CTAB with normal respiratory effort Skin:  Warm, hands and feet with annular plaques that are  coalescing with desquamation and flaking. Some erythema. No warmth or pus.   Skin bx: spongiotic vesicular eczema;see report    Commons side effects, risks, benefits, and alternatives for medications and treatment plan prescribed today were discussed, and the patient expressed understanding of the given instructions. Patient is instructed to call or message via MyChart if he/she has any questions or concerns regarding our treatment plan. No barriers to understanding were identified. We discussed Red Flag symptoms and signs in detail. Patient expressed understanding regarding what to do in case of urgent or emergency type symptoms.   Medication list was reconciled, printed and provided to the patient in AVS. Patient instructions and summary information was reviewed with the patient as documented in the AVS. This note was prepared with assistance of Dragon voice recognition software. Occasional wrong-word or sound-a-like substitutions may have occurred due to  the inherent limitations of voice recognition software

## 2019-03-19 NOTE — Patient Instructions (Addendum)
Please follow up if symptoms do not improve or as needed.   Please see Karma Lew, PA at the dermatology office:  October 20th at [address], [place] [address] [phone]  I have printed prednisone and ultram to start now to see if the prednisone will calm down the rash. I recommend taking pictures of the rash now and daily to show [place].  The ultram will help manage pain.   Good luck and hang in there!

## 2019-03-24 ENCOUNTER — Other Ambulatory Visit: Payer: Self-pay | Admitting: Physician Assistant

## 2019-03-24 DIAGNOSIS — L308 Other specified dermatitis: Secondary | ICD-10-CM | POA: Diagnosis not present

## 2019-03-24 DIAGNOSIS — D485 Neoplasm of uncertain behavior of skin: Secondary | ICD-10-CM | POA: Diagnosis not present

## 2019-03-24 DIAGNOSIS — L271 Localized skin eruption due to drugs and medicaments taken internally: Secondary | ICD-10-CM | POA: Diagnosis not present

## 2019-03-30 ENCOUNTER — Encounter: Payer: Self-pay | Admitting: Internal Medicine

## 2019-03-30 ENCOUNTER — Other Ambulatory Visit: Payer: Self-pay

## 2019-03-30 ENCOUNTER — Ambulatory Visit (AMBULATORY_SURGERY_CENTER): Payer: Medicare Other | Admitting: *Deleted

## 2019-03-30 VITALS — Temp 96.4°F | Ht 71.0 in | Wt 251.6 lb

## 2019-03-30 DIAGNOSIS — Z1159 Encounter for screening for other viral diseases: Secondary | ICD-10-CM

## 2019-03-30 DIAGNOSIS — Z8719 Personal history of other diseases of the digestive system: Secondary | ICD-10-CM

## 2019-03-30 NOTE — Progress Notes (Signed)
No egg or soy allergy known to patient  No issues with past sedation with any surgeries  or procedures, no intubation problems  No diet pills per patient No home 02 use per patient  No blood thinners per patient  Pt denies issues with constipation  No A fib or A flutter  EMMI video sent to pt's e mail   Due to the COVID-19 pandemic we are asking patients to follow these guidelines. Please only bring one care partner. Please be aware that your care partner may wait in the car in the parking lot or if they feel like they will be too hot to wait in the car, they may wait in the lobby on the 4th floor. All care partners are required to wear a mask the entire time (we do not have any that we can provide them), they need to practice social distancing, and we will do a Covid check for all patient's and care partners when you arrive. Also we will check their temperature and your temperature. If the care partner waits in their car they need to stay in the parking lot the entire time and we will call them on their cell phone when the patient is ready for discharge so they can bring the car to the front of the building. Also all patient's will need to wear a mask into building.  COVID PREP 04/08/19 ,9:05 AM  SHEET PROVIDED FOR OTC MIRALAX.

## 2019-03-30 NOTE — Telephone Encounter (Signed)
Gatha Mayer, MD sent to Marlon Pel, RN        Angel Frazier has a rash from [name]   Dermatology sent me a note   He needs to stop the Humira for now and likely permanently   I see he is having a colonoscopy soon and I will discuss more then       Patient notified to hold for now.  He verbalized understanding

## 2019-04-01 DIAGNOSIS — M542 Cervicalgia: Secondary | ICD-10-CM | POA: Diagnosis not present

## 2019-04-03 DIAGNOSIS — M542 Cervicalgia: Secondary | ICD-10-CM | POA: Diagnosis not present

## 2019-04-07 ENCOUNTER — Other Ambulatory Visit: Payer: Self-pay | Admitting: Internal Medicine

## 2019-04-07 DIAGNOSIS — R918 Other nonspecific abnormal finding of lung field: Secondary | ICD-10-CM

## 2019-04-08 ENCOUNTER — Other Ambulatory Visit: Payer: Self-pay | Admitting: Internal Medicine

## 2019-04-08 DIAGNOSIS — Z1159 Encounter for screening for other viral diseases: Secondary | ICD-10-CM | POA: Diagnosis not present

## 2019-04-08 LAB — SARS CORONAVIRUS 2 (TAT 6-24 HRS): SARS Coronavirus 2: NEGATIVE

## 2019-04-09 ENCOUNTER — Telehealth: Payer: Self-pay | Admitting: Family Medicine

## 2019-04-09 DIAGNOSIS — L409 Psoriasis, unspecified: Secondary | ICD-10-CM | POA: Diagnosis not present

## 2019-04-09 NOTE — Telephone Encounter (Signed)
I left a message asking the patient to call and schedule Medicare AWV with Loma Sousa ([place]).  If patient calls back, please schedule Medicare Wellness Visit at next available opening.  VDM ([place])

## 2019-04-13 ENCOUNTER — Ambulatory Visit (AMBULATORY_SURGERY_CENTER): Payer: Medicare Other | Admitting: Internal Medicine

## 2019-04-13 ENCOUNTER — Encounter: Payer: Self-pay | Admitting: Internal Medicine

## 2019-04-13 ENCOUNTER — Other Ambulatory Visit: Payer: Self-pay

## 2019-04-13 VITALS — BP 113/80 | HR 84 | Temp 97.8°F | Resp 8 | Ht 71.0 in | Wt 251.6 lb

## 2019-04-13 DIAGNOSIS — L409 Psoriasis, unspecified: Secondary | ICD-10-CM

## 2019-04-13 DIAGNOSIS — K5190 Ulcerative colitis, unspecified, without complications: Secondary | ICD-10-CM | POA: Diagnosis not present

## 2019-04-13 DIAGNOSIS — K5130 Ulcerative (chronic) rectosigmoiditis without complications: Secondary | ICD-10-CM | POA: Diagnosis not present

## 2019-04-13 DIAGNOSIS — Z1211 Encounter for screening for malignant neoplasm of colon: Secondary | ICD-10-CM

## 2019-04-13 DIAGNOSIS — K5150 Left sided colitis without complications: Secondary | ICD-10-CM | POA: Diagnosis not present

## 2019-04-13 HISTORY — DX: Psoriasis, unspecified: L40.9

## 2019-04-13 MED ORDER — SODIUM CHLORIDE 0.9 % IV SOLN: 500.0000 mL | Freq: Once | INTRAVENOUS | Status: DC

## 2019-04-13 NOTE — Progress Notes (Signed)
Report to PACU, RN, vss, BBS= Clear.  

## 2019-04-13 NOTE — Progress Notes (Signed)
Called to room to assist during endoscopic procedure.  Patient ID and intended procedure confirmed with present staff. Received instructions for my participation in the procedure from the performing physician.  

## 2019-04-13 NOTE — Op Note (Signed)
[facility] Patient Name: Angel Frazier Procedure Date: 04/13/2019 8:53 AM MRN: [id number] Endoscopist: Gatha Mayer , MD Age: 63 Referring MD:  Date of Birth: [birth year] Gender: Male Account #: [id number] Procedure:                Colonoscopy Indications:              Left-sided chronic ulcerative colitis Medicines:                Propofol per Anesthesia, Monitored Anesthesia Care Procedure:                Pre-Anesthesia Assessment:                           - Prior to the procedure, a History and Physical                            was performed, and patient medications and                            allergies were reviewed. The patient's tolerance of                            previous anesthesia was also reviewed. The risks                            and benefits of the procedure and the sedation                            options and risks were discussed with the patient.                            All questions were answered, and informed consent                            was obtained. Prior Anticoagulants: The patient has                            taken no previous anticoagulant or antiplatelet                            agents. ASA Grade Assessment: III - A patient with                            severe systemic disease. After reviewing the risks                            and benefits, the patient was deemed in                            satisfactory condition to undergo the procedure.                           After obtaining informed consent, the colonoscope  was passed under direct vision. Throughout the                            procedure, the patient's blood pressure, pulse, and                            oxygen saturations were monitored continuously. The                            Colonoscope was introduced through the anus and                            advanced to the the cecum, identified by   appendiceal orifice and ileocecal valve. The                            colonoscopy was performed without difficulty. The                            patient tolerated the procedure well. The quality                            of the bowel preparation was excellent. The bowel                            preparation used was Miralax via split dose                            instruction. The ileocecal valve, appendiceal                            orifice, and rectum were photographed. Scope In: 9:04:16 AM Scope Out: 9:15:32 AM Scope Withdrawal Time: 0 hours 8 minutes 15 seconds  Total Procedure Duration: 0 hours 11 minutes 16 seconds  Findings:                 The perianal and digital rectal examinations were                            normal. Pertinent negatives include normal prostate                            (size, shape, and consistency).                           A patchy area of mildly erythematous, granular and                            vascular-pattern-decreased mucosa was found in the                            sigmoid colon. Biopsies were taken with a cold                            forceps for histology. Verification of  patient                            identification for the specimen was done. Estimated                            blood loss was minimal.                           The exam was otherwise without abnormality on                            direct and retroflexion views. Though did see scars                            from previous hemorrhoid banding                           Biopsies for histology were taken with a cold                            forceps from the cecum, ascending colon, transverse                            colon, descending colon and sigmoid colon for                            evaluation of microscopic colitis. Complications:            No immediate complications. Estimated Blood Loss:     Estimated blood loss was minimal. Impression:                - Erythematous, granular and                            vascular-pattern-decreased mucosa in the sigmoid                            colon. Biopsied. Mild, ninimal and very patchy -                            rectal biopsies included here. Overall in good                            shape.                           - The examination was otherwise normal on direct                            and retroflexion views. Except banding scars                           - Biopsies were taken with a cold forceps from the  cecum, ascending colon, transverse colon,                            descending colon and sigmoid colon for evaluation                            of microscopic colitis. Recommendation:           - Patient has a contact number available for                            emergencies. The signs and symptoms of potential                            delayed complications were discussed with the                            patient. Return to normal activities tomorrow.                            Written discharge instructions were provided to the                            patient.                           - Resume previous diet.                           - Continue present medications.                           - Repeat colonoscopy is recommended. The                            colonoscopy date will be determined after pathology                            results from today's exam become available for                            review.                           - will need to find a different treatment for UC as                            Humira caused psoriasis Gatha Mayer, MD 04/13/2019 9:29:58 AM This report has been signed electronically.

## 2019-04-13 NOTE — Patient Instructions (Addendum)
Things look good here Angel Frazier.  I took biopsies as we always do and will let you know results.  It is unfortunate that the Humira caused the psoriasis. We will need to look for a different treatment.  I have messaged Dr. Yong Channel about blood pressure medication.  I appreciate the opportunity to care for you.  Gatha Mayer, MD, FACG  YOU HAD AN ENDOSCOPIC PROCEDURE TODAY AT [facility]:   Refer to the procedure report that was given to you for any specific questions about what was found during the examination.  If the procedure report does not answer your questions, please call your gastroenterologist to clarify.  If you requested that your care partner not be given the details of your procedure findings, then the procedure report has been included in a sealed envelope for you to review at your convenience later.  YOU SHOULD EXPECT: Some feelings of bloating in the abdomen. Passage of more gas than usual.  Walking can help get rid of the air that was put into your GI tract during the procedure and reduce the bloating. If you had a lower endoscopy (such as a colonoscopy or flexible sigmoidoscopy) you may notice spotting of blood in your stool or on the toilet paper. If you underwent a bowel prep for your procedure, you may not have a normal bowel movement for a few days.  Please Note:  You might notice some irritation and congestion in your nose or some drainage.  This is from the oxygen used during your procedure.  There is no need for concern and it should clear up in a day or so.  SYMPTOMS TO REPORT IMMEDIATELY:   Following lower endoscopy (colonoscopy or flexible sigmoidoscopy):  Excessive amounts of blood in the stool  Significant tenderness or worsening of abdominal pains  Swelling of the abdomen that is new, acute  Fever of 100F or higher   For urgent or emergent issues, a gastroenterologist can be reached at any hour by calling [phone].   DIET:  We do  recommend a small meal at first, but then you may proceed to your regular diet.  Drink plenty of fluids but you should avoid alcoholic beverages for 24 hours.  ACTIVITY:  You should plan to take it easy for the rest of today and you should NOT DRIVE or use heavy machinery until tomorrow (because of the sedation medicines used during the test).    FOLLOW UP: Our staff will call the number listed on your records 48-72 hours following your procedure to check on you and address any questions or concerns that you may have regarding the information given to you following your procedure. If we do not reach you, we will leave a message.  We will attempt to reach you two times.  During this call, we will ask if you have developed any symptoms of COVID 19. If you develop any symptoms (ie: fever, flu-like symptoms, shortness of breath, cough etc.) before then, please call [phone].  If you test positive for Covid 19 in the 2 weeks post procedure, please call and report this information to [place].    If any biopsies were taken you will be contacted by phone or by letter within the next 1-3 weeks.  Please call us at [phone] if you have not heard about the biopsies in 3 weeks.    SIGNATURES/CONFIDENTIALITY: You and/or your care partner have signed paperwork which will be entered into your electronic medical record.  These  signatures attest to the fact that that the information above on your After Visit Summary has been reviewed and is understood.  Full responsibility of the confidentiality of this discharge information lies with you and/or your care-partner.

## 2019-04-13 NOTE — Progress Notes (Signed)
Temperature taken by J.B., VS taken by C.W.

## 2019-04-13 NOTE — Progress Notes (Signed)
Pt's states no medical or surgical changes since previsit or office visit. 

## 2019-04-15 ENCOUNTER — Telehealth: Payer: Self-pay | Admitting: *Deleted

## 2019-04-15 ENCOUNTER — Telehealth: Payer: Self-pay | Admitting: Family Medicine

## 2019-04-15 NOTE — Telephone Encounter (Signed)
  Follow up Call-  Call back number 04/13/2019 03/13/2018  Post procedure Call Back phone  # [phone]  Permission to leave phone message Yes Yes  Some recent data might be hidden     Patient questions:  Do you have a fever, pain , or abdominal swelling? No. Pain Score  0 *  Have you tolerated food without any problems? Yes.    Have you been able to return to your normal activities? Yes.    Do you have any questions about your discharge instructions: Diet   No. Medications  No. Follow up visit  No.  Do you have questions or concerns about your Care? No.  Actions: * If pain score is 4 or above: No action needed, pain <4.  1. Have you developed a fever since your procedure? no  2.   Have you had an respiratory symptoms (SOB or cough) since your procedure? no  3.   Have you tested positive for COVID 19 since your procedure no  4.   Have you had any family members/close contacts diagnosed with the COVID 19 since your procedure?  no   If yes to any of these questions please route to Joylene John, RN and Alphonsa Gin, Therapist, sports.

## 2019-04-15 NOTE — Telephone Encounter (Signed)
-----   Message from Angela Adam, [place] sent at 04/15/2019  1:41 PM EST ----- Regarding: FW: BP med Called and spoke with pt and he says he is under the care of the other doctors and hasn't been checking his BP. He checked it one time last week and it was 113/80 and he is only taking Lialda, metformin and multi vitamins. ----- Message ----- From: [name], MD Sent: 04/13/2019   9:25 AM EST To: Inda Coke, PA, Gatha Mayer, MD, # Subject: RE: BP med                                     Dr. Carlean Purl- thanks for the update. Very interesting. Also including Samantha as an [place].   Team- can you please contact patient and find out how his BP has been doing at home as well as what meds he is currently taking and update me?   I may need to tweak his regimen. Dr. Carlean Purl- thanks for heads up!  [name] ----- Message ----- From: Gatha Mayer, MD Sent: 04/13/2019   9:21 AM EST To: [name], MD Subject: BP med                                         Hi Erna Brossard,  Turned out that Odel had psoriasis caused by Humira.  He was asking about his BP med today and I told him I would message you - in the beginning we held his BP med.  Thanks  Glendell Docker

## 2019-04-15 NOTE — Telephone Encounter (Signed)
  Follow up Call-  Call back number 04/13/2019 03/13/2018  Post procedure Call Back phone  # [phone]  Permission to leave phone message Yes Yes  Some recent data might be hidden     Patient questions:  Message left to call us if necessary.

## 2019-04-15 NOTE — Telephone Encounter (Signed)
It looks like he has a visit with wellness nurse tomorrow-if blood pressure remains controlled-he can remain off blood pressure medications and does not look like he is currently taking any blood pressure medicines per his report.  If he remains off blood pressure medicine I would check at least once or twice a week and if blood pressure goes up over 140/90-needs to be seen for a visit so we can decide what to restart

## 2019-04-16 ENCOUNTER — Ambulatory Visit (INDEPENDENT_AMBULATORY_CARE_PROVIDER_SITE_OTHER): Payer: Medicare Other

## 2019-04-16 ENCOUNTER — Other Ambulatory Visit: Payer: Self-pay

## 2019-04-16 VITALS — BP 132/74 | Temp 98.0°F | Ht 71.0 in | Wt 249.2 lb

## 2019-04-16 DIAGNOSIS — Z0000 Encounter for general adult medical examination without abnormal findings: Secondary | ICD-10-CM | POA: Diagnosis not present

## 2019-04-16 NOTE — Patient Instructions (Addendum)
Angel Frazier , Thank you for taking time to come for your Medicare Wellness Visit. I appreciate your ongoing commitment to your health goals. Please review the following plan we discussed and let me know if I can assist you in the future.   Screening recommendations/referrals: Colorectal Screening: up to date; last colonoscopy 04/13/19 with Dr. Carlean Purl   Vision and Dental Exams: Recommended annual ophthalmology exams for early detection of glaucoma and other disorders of the eye Recommended annual dental exams for proper oral hygiene  Diabetic Exams: Diabetic Eye Exam: recommended yearly; up to date Diabetic Foot Exam: recommended yearly; up to date  Vaccinations: Influenza vaccine: Pneumococcal vaccine: recommended Tdap vaccine: up to date; last 05/21/12  Shingles vaccine: Please call your insurance company to determine your out of pocket expense for the Shingrix vaccine. You may receive this vaccine at your local pharmacy.  Advanced directives: Please bring a copy of your POA (Power of Attorney) and/or Living Will to your next appointment.  Goals: Recommend to drink at least 6-8 8oz glasses of water per day and consume a balanced diet rich in fresh fruits and vegetables.   Next appointment: Please schedule your Annual Wellness Visit with your Nurse Health Advisor in one year.  Check at least once or twice a week and if blood pressure goes up over 140/90-needs to be seen for a visit so we can decide what to restart  Preventive Care 65 Years and Older, Male Preventive care refers to lifestyle choices and visits with your health care provider that can promote health and wellness. What does preventive care include?  A yearly physical exam. This is also called an annual well check.  Dental exams once or twice a year.  Routine eye exams. Ask your health care provider how often you should have your eyes checked.  Personal lifestyle choices, including:  Daily care of your teeth and  gums.  Regular physical activity.  Eating a healthy diet.  Avoiding tobacco and drug use.  Limiting alcohol use.  Practicing safe sex.  Taking low doses of aspirin every day if recommended by your health care provider..  Taking vitamin and mineral supplements as recommended by your health care provider. What happens during an annual well check? The services and screenings done by your health care provider during your annual well check will depend on your age, overall health, lifestyle risk factors, and family history of disease. Counseling  Your health care provider may ask you questions about your:  Alcohol use.  Tobacco use.  Drug use.  Emotional well-being.  Home and relationship well-being.  Sexual activity.  Eating habits.  History of falls.  Memory and ability to understand (cognition).  Work and work Statistician. Screening  You may have the following tests or measurements:  Height, weight, and BMI.  Blood pressure.  Lipid and cholesterol levels. These may be checked every 5 years, or more frequently if you are over 16 years old.  Skin check.  Lung cancer screening. You may have this screening every year starting at age 19 if you have a 30-pack-year history of smoking and currently smoke or have quit within the past 15 years.  Fecal occult blood test (FOBT) of the stool. You may have this test every year starting at age 87.  Flexible sigmoidoscopy or colonoscopy. You may have a sigmoidoscopy every 5 years or a colonoscopy every 10 years starting at age 110.  Prostate cancer screening. Recommendations will vary depending on your family history and other risks.  Hepatitis C blood test.  Hepatitis B blood test.  Sexually transmitted disease (STD) testing.  Diabetes screening. This is done by checking your blood sugar (glucose) after you have not eaten for a while (fasting). You may have this done every 1-3 years.  Abdominal aortic aneurysm (AAA)  screening. You may need this if you are a current or former smoker.  Osteoporosis. You may be screened starting at age 76 if you are at high risk. Talk with your health care provider about your test results, treatment options, and if necessary, the need for more tests. Vaccines  Your health care provider may recommend certain vaccines, such as:  Influenza vaccine. This is recommended every year.  Tetanus, diphtheria, and acellular pertussis (Tdap, Td) vaccine. You may need a Td booster every 10 years.  Zoster vaccine. You may need this after age 69.  Pneumococcal 13-valent conjugate (PCV13) vaccine. One dose is recommended after age 56.  Pneumococcal polysaccharide (PPSV23) vaccine. One dose is recommended after age 65. Talk to your health care provider about which screenings and vaccines you need and how often you need them. This information is not intended to replace advice given to you by your health care provider. Make sure you discuss any questions you have with your health care provider. Document Released: 06/17/2015 Document Revised: 02/08/2016 Document Reviewed: 03/22/2015 Elsevier Interactive Patient Education  2017 [employer] Prevention in the Home Falls can cause injuries. They can happen to people of all ages. There are many things you can do to make your home safe and to help prevent falls. What can I do on the outside of my home?  Regularly fix the edges of walkways and driveways and fix any cracks.  Remove anything that might make you trip as you walk through a door, such as a raised step or threshold.  Trim any bushes or trees on the path to your home.  Use bright outdoor lighting.  Clear any walking paths of anything that might make someone trip, such as rocks or tools.  Regularly check to see if handrails are loose or broken. Make sure that both sides of any steps have handrails.  Any raised decks and porches should have guardrails on the  edges.  Have any leaves, snow, or ice cleared regularly.  Use sand or salt on walking paths during winter.  Clean up any spills in your garage right away. This includes oil or grease spills. What can I do in the bathroom?  Use night lights.  Install grab bars by the toilet and in the tub and shower. Do not use towel bars as grab bars.  Use non-skid mats or decals in the tub or shower.  If you need to sit down in the shower, use a plastic, non-slip stool.  Keep the floor dry. Clean up any water that spills on the floor as soon as it happens.  Remove soap buildup in the tub or shower regularly.  Attach bath mats securely with double-sided non-slip rug tape.  Do not have throw rugs and other things on the floor that can make you trip. What can I do in the bedroom?  Use night lights.  Make sure that you have a light by your bed that is easy to reach.  Do not use any sheets or blankets that are too big for your bed. They should not hang down onto the floor.  Have a firm chair that has side arms. You can use this for support while you  get dressed.  Do not have throw rugs and other things on the floor that can make you trip. What can I do in the kitchen?  Clean up any spills right away.  Avoid walking on wet floors.  Keep items that you use a lot in easy-to-reach places.  If you need to reach something above you, use a strong step stool that has a grab bar.  Keep electrical cords out of the way.  Do not use floor polish or wax that makes floors slippery. If you must use wax, use non-skid floor wax.  Do not have throw rugs and other things on the floor that can make you trip. What can I do with my stairs?  Do not leave any items on the stairs.  Make sure that there are handrails on both sides of the stairs and use them. Fix handrails that are broken or loose. Make sure that handrails are as long as the stairways.  Check any carpeting to make sure that it is firmly  attached to the stairs. Fix any carpet that is loose or worn.  Avoid having throw rugs at the top or bottom of the stairs. If you do have throw rugs, attach them to the floor with carpet tape.  Make sure that you have a light switch at the top of the stairs and the bottom of the stairs. If you do not have them, ask someone to add them for you. What else can I do to help prevent falls?  Wear shoes that:  Do not have high heels.  Have rubber bottoms.  Are comfortable and fit you well.  Are closed at the toe. Do not wear sandals.  If you use a stepladder:  Make sure that it is fully opened. Do not climb a closed stepladder.  Make sure that both sides of the stepladder are locked into place.  Ask someone to hold it for you, if possible.  Clearly mark and make sure that you can see:  Any grab bars or handrails.  First and last steps.  Where the edge of each step is.  Use tools that help you move around (mobility aids) if they are needed. These include:  Canes.  Walkers.  Scooters.  Crutches.  Turn on the lights when you go into a dark area. Replace any light bulbs as soon as they burn out.  Set up your furniture so you have a clear path. Avoid moving your furniture around.  If any of your floors are uneven, fix them.  If there are any pets around you, be aware of where they are.  Review your medicines with your doctor. Some medicines can make you feel dizzy. This can increase your chance of falling. Ask your doctor what other things that you can do to help prevent falls. This information is not intended to replace advice given to you by your health care provider. Make sure you discuss any questions you have with your health care provider. Document Released: 03/17/2009 Document Revised: 10/27/2015 Document Reviewed: 06/25/2014 Elsevier Interactive Patient Education  2017 [employer].

## 2019-04-16 NOTE — Progress Notes (Signed)
I have reviewed and agree with note, evaluation, plan.   I appreciate guidance on blood pressure-well controlled today-I believe without his blood pressure medications BP Readings from Last 3 Encounters:  04/16/19 132/74  04/13/19 113/80  03/19/19 132/88     [name], MD

## 2019-04-16 NOTE — Progress Notes (Signed)
Subjective:   Angel Frazier is a 63 y.o. male who presents for Medicare Annual/Subsequent preventive examination.  Review of Systems:   Cardiac Risk Factors include: advanced age (>20mn, >>98women);male gender;diabetes mellitus     Objective:    Vitals: BP 132/74   Temp 98 F (36.7 C)   Ht 5' 11"  (1.803 m)   Wt 249 lb 3.2 oz (113 kg)   BMI 34.76 kg/m   Body mass index is 34.76 kg/m.  Advanced Directives 04/16/2019 06/18/2017 09/13/2015 08/20/2015  Does Patient Have a Medical Advance Directive? No No No No  Would patient like information on creating a medical advance directive? Yes (MAU/Ambulatory/Procedural Areas - Information given) Yes (MAU/Ambulatory/Procedural Areas - Information given) Yes - Educational materials given No - patient declined information    Tobacco Social History   Tobacco Use  Smoking Status Former Smoker  . Packs/day: 1.00  . Years: 6.00  . Pack years: 6.00  . Types: Cigarettes  . Quit date: 06/04/2000  . Years since quitting: 18.8  Smokeless Tobacco Never Used     Counseling given: Not Answered   Clinical Intake:  Pre-visit preparation completed: Yes  Pain : No/denies pain  Diabetes: Yes CBG done?: No Did pt. bring in CBG monitor from home?: No  How often do you need to have someone help you when you read instructions, pamphlets, or other written materials from your doctor or pharmacy?: 1 - Never  Interpreter Needed?: No  Information entered by :: CDenman GeorgeLPN  Past Medical History:  Diagnosis Date  .  vitamin D deficiency 01/24/2013   01/23/13 - level 29 - supplements 50k IU weekly x 6 recommended   . Anabolic steroid abuse    PT. DENIES 03/30/19  . Arthritis   . Avascular necrosis of femoral head (HCC)    bilateral  . Bleeding hemorrhoids   . Colitis 08/06/2013   Mildly actibr colitis  . Diabetes [place] [address]  type 2  . Drug-induced acute pancreatitis - 6MP 09/07/2015  . Erectile dysfunction    . Hypertension   . [place]ided ulcerative colitis ([place]   . Psoriasis caused by Humira 04/13/2019  . Spinal stenosis of lumbar region at multiple levels    Past Surgical History:  Procedure Laterality Date  . COLONOSCOPY     multiple  . KNEE ARTHROSCOPY Right   . SIGMOIDOSCOPY     Family History  Problem Relation Age of Onset  . Diabetes Mother   . Diabetes Other        cousin  . Hypertension Other   . Diabetes type I Son   . Colon cancer Neg Hx   . Esophageal cancer Neg Hx   . Rectal cancer Neg Hx   . Stomach cancer Neg Hx   . Colon polyps Neg Hx    Social History   Socioeconomic History  . Marital status: Single    Spouse name: Not on file  . Number of children: 1  . Years of education: Not on file  . Highest education level: Not on file  Occupational History  . Occupation: retired    EFish farm manager [employer] . Financial resource strain: Not on file  . Food insecurity    Worry: Not on file    Inability: Not on file  . Transportation needs    Medical: Not on file    Non-medical: Not on file  Tobacco Use  . Smoking  status: Former Smoker    Packs/day: 1.00    Years: 6.00    Pack years: 6.00    Types: Cigarettes    Quit date: 06/04/2000    Years since quitting: 18.8  . Smokeless tobacco: Never Used  Substance and Sexual Activity  . Alcohol use: Yes    Comment: occasionally  . Drug use: No  . Sexual activity: Not on file  Lifestyle  . Physical activity    Days per week: Not on file    Minutes per session: Not on file  . Stress: Not on file  Relationships  . Social Herbalist on phone: Not on file    Gets together: Not on file    Attends religious service: Not on file    Active member of club or organization: Not on file    Attends meetings of clubs or organizations: Not on file    Relationship status: Not on file  Other Topics Concern  . Not on file  Social History Narrative   Single never married. 1 son 50 in  2018. 1 grandson 69 in 2018. Lives in [place] alone      Disability- low back issues   Roll up machine operator      Hobbies: enjoys tinkering in his shed, enjoys going to the gym    Outpatient Encounter Medications as of 04/16/2019  Medication Sig  . ACCU-CHEK AVIVA PLUS test strip TEST 4 TIMES A DAY AS DIRECTED BY PRESCIBER (Patient taking differently: 1 each by Other route 4 (four) times daily. )  . ACCU-CHEK SOFTCLIX LANCETS lancets TEST 4 TIMES A DAY  . Ascorbic Acid (VITAMIN C) 1000 MG tablet Take 1,000 mg by mouth daily. Reported on 09/28/2015  . aspirin 81 MG tablet Take 81 mg by mouth daily. Reported on 09/28/2015  . atorvastatin (LIPITOR) 20 MG tablet Take 1 tablet (20 mg total) by mouth once a week. (Patient taking differently: Take 20 mg by mouth every Sunday. )  . blood glucose meter kit and supplies Dispense based on patient and insurance preference. Use up to four times daily as directed. (FOR ICD-9 250.00, 250.01).  . Calcium Carbonate-Vitamin D (CALCIUM-VITAMIN D3 PO) Take 1 tablet by mouth daily. Reported on 09/28/2015  . Cholecalciferol (VITAMIN D-3 PO) Take 1 capsule by mouth daily with breakfast.  . clobetasol cream (TEMOVATE) 0.05 % APP AA ONCE TO TWICE D UTD  . cyclobenzaprine (FLEXERIL) 5 MG tablet Take 1 tablet (5 mg total) by mouth at bedtime.  . magnesium chloride (SLOW-MAG) 64 MG TBEC SR tablet Take 1 tablet (64 mg total) by mouth daily.  . mesalamine (LIALDA) 1.2 g EC tablet Take 2 tablets (2.4 g total) by mouth 2 (two) times daily.  . metFORMIN (GLUCOPHAGE) 1000 MG tablet Take 1 tablet (1,000 mg total) by mouth 2 (two) times daily with a meal.  . Multiple Vitamins-Minerals (CENTRUM SILVER PO) Take 1 tablet by mouth daily. Reported on 09/28/2015  . Omega-3 Fatty Acids (FISH OIL) 300 MG CAPS Take 300 mg by mouth daily.   Marland Kitchen triamcinolone cream (KENALOG) 0.1 % Apply to affected area 1-2 times daily  . vitamin B-12 (CYANOCOBALAMIN) 1000 MCG tablet Take 2,500  mcg by mouth daily. Reported on 09/28/2015  . amLODipine (NORVASC) 2.5 MG tablet Take 1 tablet (2.5 mg total) by mouth daily. (Patient not taking: Reported on 03/30/2019)   Facility-Administered Encounter Medications as of 04/16/2019  Medication  . 0.9 %  sodium chloride infusion  Activities of Daily Living In your present state of health, do you have any difficulty performing the following activities: 04/16/2019  Hearing? N  Vision? N  Difficulty concentrating or making decisions? N  Walking or climbing stairs? N  Dressing or bathing? N  Doing errands, shopping? N  Preparing Food and eating ? N  Using the Toilet? N  In the past six months, have you accidently leaked urine? N  Do you have problems with loss of bowel control? N  Managing your Medications? N  Managing your Finances? N  Housekeeping or managing your Housekeeping? N  Some recent data might be hidden    Patient Care Team: [name], MD as PCP - General (Family Medicine) Renato Shin, MD as Consulting Physician (Endocrinology) Tanda Rockers, MD as Consulting Physician (Pulmonary Disease) Gatha Mayer, MD as Consulting Physician (Gastroenterology)   Assessment:   This is a routine wellness examination for [place].  Exercise Activities and Dietary recommendations Current Exercise Habits: The patient does not participate in regular exercise at present  Goals    . Maintain current health status       Fall Risk Fall Risk  04/16/2019 01/20/2019 06/18/2017 09/13/2015  Falls in the past year? 0 0 No No  Number falls in past yr: - 0 - -  Injury with Fall? 0 0 - -  Follow up Education provided;Falls prevention discussed;Falls evaluation completed - - -   Is the patient's home free of loose throw rugs in walkways, pet beds, electrical cords, etc?   yes      Grab bars in the bathroom? yes      Handrails on the stairs?   yes      Adequate lighting?   yes  Timed Get Up and Go Performed: completed and within  normal timeframe; no gait abnormalities noted   Depression Screen PHQ 2/9 Scores 04/16/2019 03/04/2019 01/20/2019 12/04/2018  PHQ - 2 Score 0 0 0 3  PHQ- 9 Score - 0 - 3    Cognitive Function- no cognitive concerns at this time   6CIT Screen 04/16/2019  What Year? 0 points  What month? 0 points  What time? 0 points  Count back from 20 0 points  Months in reverse 0 points  Repeat phrase 0 points  Total Score 0    Immunization History  Administered Date(s) Administered  . Influenza Split 02/15/2011  . Influenza, Seasonal, Injecte, Preservative Fre 05/21/2012  . Influenza,inj,Quad PF,6+ Mos 06/03/2013  . Td 06/04/2001  . Tdap 05/21/2012    Qualifies for Shingles Vaccine? Discussed and patient will check with pharmacy for coverage.  Patient education handout provided   Screening Tests Health Maintenance  Topic Date Due  . PNA vac Low Risk Adult (1 of 2 - PCV13) 03/30/2019  . INFLUENZA VACCINE  04/18/2019 (Originally 01/03/2019)  . FOOT EXAM  06/24/2019  . HEMOGLOBIN A1C  08/23/2019  . URINE MICROALBUMIN  12/04/2019  . OPHTHALMOLOGY EXAM  12/17/2019  . TETANUS/TDAP  05/21/2022  . COLONOSCOPY  04/12/2024  . Hepatitis C Screening  Completed  . HIV Screening  Completed   Cancer Screenings: Lung: Low Dose CT Chest recommended if Age 77-80 years, 30 pack-year currently smoking OR have quit w/in 15years. Patient does not qualify. Colorectal: colonoscopy 04/13/19 with Dr. Carlean Purl       Plan:  I have personally reviewed and addressed the Medicare Annual Wellness questionnaire and have noted the following in the patient's chart:  A. Medical and social history B.  Use of alcohol, tobacco or illicit drugs  C. Current medications and supplements D. Functional ability and status E.  Nutritional status F.  Physical activity G. Advance directives H. List of other physicians I.  Hospitalizations, surgeries, and ER visits in previous 12 months J.  [place] such as hearing  and vision if needed, cognitive and depression L. Referrals, records requested, and appointments- none   In addition, I have reviewed and discussed with patient certain preventive protocols, quality metrics, and best practice recommendations. A written personalized care plan for preventive services as well as general preventive health recommendations were provided to patient.   Signed,  Denman George, LPN  Nurse Health Advisor   Nurse Notes: Patient aware that he should monitor blood pressure at home and report if he has readings above 140/90.

## 2019-04-18 ENCOUNTER — Other Ambulatory Visit: Payer: Self-pay | Admitting: Family Medicine

## 2019-04-27 NOTE — Progress Notes (Signed)
Attempted to call - no answer  Please call from office - has some inflammation still  I think he was doing ok at this point  We are going to need to change to a new biologic but as long as he is ok right now I want to wait until the psoriasis is better and see him in January to review things.  If he is having GI problems let me know  Vinnie Level - recall colon 1 year no letter

## 2019-05-04 ENCOUNTER — Ambulatory Visit (INDEPENDENT_AMBULATORY_CARE_PROVIDER_SITE_OTHER)
Admission: RE | Admit: 2019-05-04 | Discharge: 2019-05-04 | Disposition: A | Discharge status: Home / Self Care | Payer: Medicare Other | Source: Ambulatory Visit | Attending: Internal Medicine | Admitting: Internal Medicine

## 2019-05-04 ENCOUNTER — Other Ambulatory Visit: Payer: Self-pay

## 2019-05-04 ENCOUNTER — Encounter (INDEPENDENT_AMBULATORY_CARE_PROVIDER_SITE_OTHER): Payer: Self-pay

## 2019-05-04 DIAGNOSIS — R918 Other nonspecific abnormal finding of lung field: Secondary | ICD-10-CM

## 2019-05-04 DIAGNOSIS — R911 Solitary pulmonary nodule: Secondary | ICD-10-CM | POA: Diagnosis not present

## 2019-05-05 ENCOUNTER — Telehealth: Payer: Self-pay | Admitting: Internal Medicine

## 2019-05-05 NOTE — Progress Notes (Signed)
LMTCB

## 2019-05-05 NOTE — Progress Notes (Signed)
Spoke with pt and notified of results per Dr. Wert. Pt verbalized understanding and denied any questions. 

## 2019-05-05 NOTE — Telephone Encounter (Signed)
Notes recorded by Tanda Rockers, MD on 05/04/2019 at 5:27 PM EST  Call patient : Study is minimally changed so nothing to worry about but radiology wants to keep an eye on him so repeat in year placed in reminder file        Spoke with pt and notified of results per Dr. Melvyn Novas. Pt verbalized understanding and denied any questions.

## 2019-05-14 LAB — MICROALBUMIN, URINE: Microalb, Ur: 89.5

## 2019-06-15 ENCOUNTER — Ambulatory Visit (INDEPENDENT_AMBULATORY_CARE_PROVIDER_SITE_OTHER): Payer: Medicare Other | Admitting: Endocrinology

## 2019-06-15 ENCOUNTER — Other Ambulatory Visit: Payer: Self-pay

## 2019-06-15 ENCOUNTER — Encounter: Payer: Self-pay | Admitting: Endocrinology

## 2019-06-15 VITALS — BP 130/88 | HR 93 | Ht 71.0 in | Wt 261.0 lb

## 2019-06-15 DIAGNOSIS — E109 Type 1 diabetes mellitus without complications: Secondary | ICD-10-CM

## 2019-06-15 LAB — POCT GLYCOSYLATED HEMOGLOBIN (HGB A1C): Hemoglobin A1C: 6.3 % — AB (ref 4.0–5.6)

## 2019-06-15 NOTE — Patient Instructions (Addendum)
check your blood sugar once a day.  vary the time of day when you check, between before the 3 meals, and at bedtime.  also check if you have symptoms of your blood sugar being too high or too low.  please keep a record of the readings and bring it to your next appointment here (or you can bring the meter itself).  You can write it on any piece of paper.  please call us sooner if your blood sugar goes below 70, or if you have a lot of readings over 200.  Please continue the same metformin.    Please come back for a follow-up appointment in 3-4 months.

## 2019-06-15 NOTE — Progress Notes (Signed)
Subjective:    Patient ID: Angel Frazier, male    DOB: 1956/05/29, 64 y.o.   MRN: [id number]  HPI Pt returns for f/u of diabetes mellitus: DM type: 1, in remission (AKA atypical DM, intermittently ketosis-prone DM, etc).   Dx'ed: 3976 Complications: none.   Therapy: metformin.    DKA: once, at dx Severe hypoglycemia: never. Pancreatitis: never Pancreatic imaging: normal on 2017 CT Other: he has been in remission since 2017. He took insulin for a brief time after dx; edema limits rx options.  Interval history: pt says cbg's are well-controlled.  pt states he feels well in general.  He takes metformin as rx'ed.   Past Medical History:  Diagnosis Date  .  vitamin D deficiency 01/24/2013   01/23/13 - level 29 - supplements 50k IU weekly x 6 recommended   . Anabolic steroid abuse    PT. DENIES 03/30/19  . Arthritis   . Avascular necrosis of femoral head (HCC)    bilateral  . Bleeding hemorrhoids   . Colitis 08/06/2013   Mildly actibr colitis  . Diabetes mellitus without complication ([place]) 7341   type 2  . Drug-induced acute pancreatitis - 6MP 09/07/2015  . Erectile dysfunction   . Hypertension   . Left sided ulcerative colitis ([place])   . Psoriasis caused by Humira 04/13/2019  . Spinal stenosis of lumbar region at multiple levels     Past Surgical History:  Procedure Laterality Date  . COLONOSCOPY     multiple  . KNEE ARTHROSCOPY Right   . SIGMOIDOSCOPY      Social History   Socioeconomic History  . Marital status: Single    Spouse name: Not on file  . Number of children: 1  . Years of education: Not on file  . Highest education level: Not on file  Occupational History  . Occupation: retired    Fish farm manager: [employer]  Tobacco Use  . Smoking status: Former Smoker    Packs/day: 1.00    Years: 6.00    Pack years: 6.00    Types: Cigarettes    Quit date: 06/04/2000    Years since quitting: 19.0  . Smokeless tobacco: Never Used  Substance and Sexual Activity    . Alcohol use: Yes    Comment: occasionally  . Drug use: No  . Sexual activity: Not on file  Other Topics Concern  . Not on file  Social History Narrative   Single never married. 1 son 28 in 2018. 1 grandson 47 in 2018. Lives in [place] alone      Disability- low back issues   Roll up machine operator      Hobbies: enjoys tinkering in his shed, enjoys going to the gym   Social Determinants of Health   Financial Resource Strain:   . Difficulty of Paying Living Expenses: Not on file  Food Insecurity:   . Worried About Charity fundraiser in the Last Year: Not on file  . Ran Out of Food in the Last Year: Not on file  Transportation Needs:   . Lack of Transportation (Medical): Not on file  . Lack of Transportation (Non-Medical): Not on file  Physical Activity:   . Days of Exercise per Week: Not on file  . Minutes of Exercise per Session: Not on file  Stress:   . Feeling of Stress : Not on file  Social Connections:   . Frequency of Communication with Friends and Family: Not on file  .  Frequency of Social Gatherings with Friends and Family: Not on file  . Attends Religious Services: Not on file  . Active Member of Clubs or Organizations: Not on file  . Attends Archivist Meetings: Not on file  . Marital Status: Not on file  Intimate Partner Violence:   . Fear of Current or Ex-Partner: Not on file  . Emotionally Abused: Not on file  . Physically Abused: Not on file  . Sexually Abused: Not on file    Current Outpatient Medications on File Prior to Visit  Medication Sig Dispense Refill  . ACCU-CHEK AVIVA PLUS test strip TEST 4 TIMES A DAY AS DIRECTED BY PRESCIBER 350 strip 3  . Accu-Chek Softclix Lancets lancets TEST 4 TIMES DAILY 300 each 0  . amLODipine (NORVASC) 2.5 MG tablet Take 1 tablet (2.5 mg total) by mouth daily. 90 tablet 3  . Ascorbic Acid (VITAMIN C) 1000 MG tablet Take 1,000 mg by mouth daily. Reported on 09/28/2015    . aspirin 81 MG tablet  Take 81 mg by mouth daily. Reported on 09/28/2015    . atorvastatin (LIPITOR) 20 MG tablet Take 1 tablet (20 mg total) by mouth once a week. (Patient taking differently: Take 20 mg by mouth every Sunday. ) 90 tablet 3  . blood glucose meter kit and supplies Dispense based on patient and insurance preference. Use up to four times daily as directed. (FOR ICD-9 250.00, 250.01). 1 each 0  . Calcium Carbonate-Vitamin D (CALCIUM-VITAMIN D3 PO) Take 1 tablet by mouth daily. Reported on 09/28/2015    . Cholecalciferol (VITAMIN D-3 PO) Take 1 capsule by mouth daily with breakfast.    . clobetasol cream (TEMOVATE) 0.05 % APP AA ONCE TO TWICE D UTD    . cyclobenzaprine (FLEXERIL) 5 MG tablet Take 1 tablet (5 mg total) by mouth at bedtime. 30 tablet 0  . magnesium chloride (SLOW-MAG) 64 MG TBEC SR tablet Take 1 tablet (64 mg total) by mouth daily. 5 tablet 0  . mesalamine (LIALDA) 1.2 g EC tablet Take 2 tablets (2.4 g total) by mouth 2 (two) times daily. 120 tablet 11  . metFORMIN (GLUCOPHAGE) 1000 MG tablet Take 1 tablet (1,000 mg total) by mouth 2 (two) times daily with a meal. 180 tablet 3  . Multiple Vitamins-Minerals (CENTRUM SILVER PO) Take 1 tablet by mouth daily. Reported on 09/28/2015    . Omega-3 Fatty Acids (FISH OIL) 300 MG CAPS Take 300 mg by mouth daily.     Marland Kitchen triamcinolone cream (KENALOG) 0.1 % Apply to affected area 1-2 times daily 30 g 0  . vitamin B-12 (CYANOCOBALAMIN) 1000 MCG tablet Take 2,500 mcg by mouth daily. Reported on 09/28/2015     Current Facility-Administered Medications on File Prior to Visit  Medication Dose Route Frequency Provider Last Rate Last Admin  . 0.9 %  sodium chloride infusion  500 mL Intravenous Once Gatha Mayer, MD        Allergies  Allergen Reactions  . Humira [Adalimumab] Rash    psoriasis  . Mercaptopurine Other (See Comments)    Caused Pancreatitis    Family History  Problem Relation Age of Onset  . Diabetes Mother   . Diabetes Other        cousin    . Hypertension Other   . Diabetes type I Son   . Colon cancer Neg Hx   . Esophageal cancer Neg Hx   . Rectal cancer Neg Hx   . Stomach cancer Neg  Hx   . Colon polyps Neg Hx     BP 130/88 (BP Location: Right Arm, Patient Position: Sitting, Cuff Size: Large)   Pulse 93   Ht 5' 11"  (1.803 m)   Wt 261 lb (118.4 kg)   SpO2 97%   BMI 36.40 kg/m    Review of Systems He has gained weight    Objective:   Physical Exam VITAL SIGNS:  See vs page GENERAL: no distress Pulses: dorsalis pedis intact bilat.   MSK: no deformity of the feet CV: trace bilat leg edema Skin:  no ulcer on the feet, but the skin is dry, scaly, and hyperpigmented  normal temp on the feet. Neuro: sensation is intact to touch on the feet   Lab Results  Component Value Date   HGBA1C 6.3 (A) 06/15/2019       Assessment & Plan:  Type 1 DM (intermittently ketosis-prone), worse.  He declines to add another med.   Edema: This limits rx options   Patient Instructions  check your blood sugar once a day.  vary the time of day when you check, between before the 3 meals, and at bedtime.  also check if you have symptoms of your blood sugar being too high or too low.  please keep a record of the readings and bring it to your next appointment here (or you can bring the meter itself).  You can write it on any piece of paper.  please call us sooner if your blood sugar goes below 70, or if you have a lot of readings over 200.  Please continue the same metformin.    Please come back for a follow-up appointment in 3-4 months.

## 2019-06-23 ENCOUNTER — Encounter: Payer: Self-pay | Admitting: Family Medicine

## 2019-06-23 LAB — HM DIABETES EYE EXAM

## 2019-07-14 ENCOUNTER — Telehealth: Payer: Self-pay | Admitting: Internal Medicine

## 2019-07-14 MED ORDER — PREDNISONE 10 MG PO TABS: ORAL_TABLET | ORAL | 0 refills | Status: AC

## 2019-07-14 NOTE — Telephone Encounter (Signed)
Spoke to the patient who reports a UC flare. He preferred to not go into detail being that he preferred to speak with [facility]. "Just tell Dr. Carlean Purl I'm having a flare and need a round of prednisone." Please advise.

## 2019-07-14 NOTE — Telephone Encounter (Signed)
Patient is calling states he is experiencing colitis flare up

## 2019-07-14 NOTE — Telephone Encounter (Signed)
Diarrhea and rectal bleeding (slight)  Prednisone taper sent in to pharmacy  I explained that Francetta Found is my RN now and she will be working with me to help my patients  He needs a call back to set up [place] for March and call back sooner prn

## 2019-07-15 ENCOUNTER — Encounter: Payer: Self-pay | Admitting: *Deleted

## 2019-07-15 NOTE — Telephone Encounter (Signed)
Left patient VM, scheduled for f/u with Dr. Carlean Purl on 3/16 at 3:30 pm

## 2019-08-18 ENCOUNTER — Ambulatory Visit: Payer: Medicare Other | Admitting: Internal Medicine

## 2019-08-20 ENCOUNTER — Ambulatory Visit: Payer: Medicare Other | Admitting: Internal Medicine

## 2019-08-20 ENCOUNTER — Encounter: Payer: Self-pay | Admitting: Internal Medicine

## 2019-08-20 VITALS — BP 144/80 | HR 68 | Temp 97.8°F | Ht 71.0 in | Wt 259.0 lb

## 2019-08-20 DIAGNOSIS — L409 Psoriasis, unspecified: Secondary | ICD-10-CM | POA: Diagnosis not present

## 2019-08-20 DIAGNOSIS — Z7952 Long term (current) use of systemic steroids: Secondary | ICD-10-CM

## 2019-08-20 DIAGNOSIS — Z7960 Long term (current) use of unspecified immunomodulators and immunosuppressants: Secondary | ICD-10-CM

## 2019-08-20 DIAGNOSIS — Z79899 Other long term (current) drug therapy: Secondary | ICD-10-CM

## 2019-08-20 DIAGNOSIS — K5150 Left sided colitis without complications: Secondary | ICD-10-CM

## 2019-08-20 NOTE — Patient Instructions (Signed)
Your lialda assistance is approved thru 05/20/2020.  Finish your steroid taper per Dr Carlean Purl.   Follow up with Dr Carlean Purl in 4-6 months.   I appreciate the opportunity to care for you. Silvano Rusk, MD, [facility]

## 2019-08-20 NOTE — Assessment & Plan Note (Addendum)
Finish taper of prednisone and observe.  Angel Frazier is limited by cost.  He gets medication assistance for [employer].  He was on an assistance program for Humira.  He is intolerant of 6-MP and other immunomodulators.  He had pancreatitis.  I am not sure what we would reach for it would all depend on what he could afford which today has been very problematic.  I think [name] would be a good option if it were not cost prohibitive for him.  We are going to going to observation mode and I will see him in 4 to 6 months and he will call back sooner if needed.  I think some of his GI symptoms are really dietary indiscretion as opposed to active colitis.  I did discuss the possibility of topical therapy with enemas as well though that has been cost prohibitive is well.

## 2019-08-20 NOTE — Assessment & Plan Note (Signed)
This is discontinued because it caused psoriasis.

## 2019-08-20 NOTE — Progress Notes (Signed)
Angel Frazier 64 y.o. 06-30-1955 [id number]  Assessment & Plan:  LEFT SIDED ULCERATIVE COLITIS Finish taper of prednisone and observe.  Angel Frazier is limited by cost.  He gets medication assistance for [employer].  He was on an assistance program for Humira.  He is intolerant of 6-MP and other immunomodulators.  He had pancreatitis.  I am not sure what we would reach for it would all depend on what he could afford which today has been very problematic.  I think Angel Frazier would be a good option if it were not cost prohibitive for him.  We are going to going to observation mode and I will see him in 4 to 6 months and he will call back sooner if needed.  I think some of his GI symptoms are really dietary indiscretion as opposed to active colitis.  I did discuss the possibility of topical therapy with enemas as well though that has been cost prohibitive is well.  Long term current use of systemic steroids He is aware of potential side effects as outlined previously.  Unfortunately we are limited in options to control his ulcerative colitis flares.  Long-term use of immunosuppressant medication Humira This is discontinued because it caused psoriasis.  Psoriasis caused by Humira Improved.  Keep follow-up with dermatology.  I have recommended he proceed with the Covid vaccination.  CC: Angel Olp, MD  Dr. Lavonna Monarch   Subjective:   Chief Complaint: Left-sided ulcerative colitis  HPI Angel Frazier is here for follow-up, he has left-sided ulcerative colitis that was steroid dependent while on mesalamine and he has intolerance or allergy to 6-MP, he was started on Humira in the spring 2020 and then was improved but unfortunately developed psoriasis secondary to Humira so we had to stop that.  At his last colonoscopy in November 2020 there was some erythema and suggestion of mild endoscopic inflammation in the sigmoid colon and rectum, rectal sigmoid and descending colon biopsy showed mildly active  chronic colitis.  He is here for follow-up today to discuss future treatment.  He remains on mesalamine.  He saw Dr. Loanne Drilling of endocrinology January 11, he is characterized as a type I diabetic in remission, atypical diabetes mellitus intermittently ketosis-prone.  He is on Metformin, it looks like Dr. Loanne Drilling thinks he should be on an additional agent but the patient declined.  He is followed by Dr. Melvyn Novas of pulmonary for a nodule, it was 9 mm when it was 5 mm a year ago and he has an 11 mm also groundglass nodule, these are in the left upper lobes and right middle lobes respectively and the plan is to continue to follow.   Currently he is doing better though he has "good and bad days".  An example is he ate some small recently and then had 4 bowel movements that day.  That was a bad day.  It sounds like most days 1 or 2 stools.  He is still troubled by some urgent defecation when he is out of the house.  He does not use antidiarrheals.  He is due to finish prednisone taper that was started in early February when he complained of ulcerative colitis flare.  No bleeding.  No significant abdominal pain.  He wishes he could get back to the gym.  He was waiting to ask about the Covid vaccine until he saw me. Lab Results  Component Value Date   HGBA1C 6.3 (A) 06/15/2019    Allergies  Allergen Reactions  . Humira [  Adalimumab] Rash    psoriasis  . Mercaptopurine Other (See Comments)    Caused Pancreatitis   Current Meds  Medication Sig  . ACCU-CHEK AVIVA PLUS test strip TEST 4 TIMES A DAY AS DIRECTED BY PRESCIBER  . Accu-Chek Softclix Lancets lancets TEST 4 TIMES DAILY  . amLODipine (NORVASC) 2.5 MG tablet Take 1 tablet (2.5 mg total) by mouth daily.  . Ascorbic Acid (VITAMIN C) 1000 MG tablet Take 1,000 mg by mouth daily. Reported on 09/28/2015  . aspirin 81 MG tablet Take 81 mg by mouth daily. Reported on 09/28/2015  . atorvastatin (LIPITOR) 20 MG tablet Take 1 tablet (20 mg total) by mouth once  a week. (Patient taking differently: Take 20 mg by mouth every Sunday. )  . blood glucose meter kit and supplies Dispense based on patient and insurance preference. Use up to four times daily as directed. (FOR ICD-9 250.00, 250.01).  . Calcium Carbonate-Vitamin D (CALCIUM-VITAMIN D3 PO) Take 1 tablet by mouth daily. Reported on 09/28/2015  . Cholecalciferol (VITAMIN D-3 PO) Take 1 capsule by mouth daily with breakfast.  . clobetasol cream (TEMOVATE) 0.05 % APP AA ONCE TO TWICE D UTD  . cyclobenzaprine (FLEXERIL) 5 MG tablet Take 1 tablet (5 mg total) by mouth at bedtime.  . magnesium chloride (SLOW-MAG) 64 MG TBEC SR tablet Take 1 tablet (64 mg total) by mouth daily.  . mesalamine (LIALDA) 1.2 g EC tablet Take 2 tablets (2.4 g total) by mouth 2 (two) times daily.  . metFORMIN (GLUCOPHAGE) 1000 MG tablet Take 1 tablet (1,000 mg total) by mouth 2 (two) times daily with a meal.  . Multiple Vitamins-Minerals (CENTRUM SILVER PO) Take 1 tablet by mouth daily. Reported on 09/28/2015  . Omega-3 Fatty Acids (FISH OIL) 300 MG CAPS Take 300 mg by mouth daily.   . predniSONE (DELTASONE) 10 MG tablet Take 4 tablets (40 mg total) by mouth daily with breakfast for 5 days, THEN 3 tablets (30 mg total) daily with breakfast for 5 days, THEN 2 tablets (20 mg total) daily with breakfast for 7 days, THEN 1.5 tablets (15 mg total) daily with breakfast for 7 days, THEN 1 tablet (10 mg total) daily with breakfast for 7 days, THEN 0.5 tablets (5 mg total) daily with breakfast for 7 days.  Marland Kitchen triamcinolone cream (KENALOG) 0.1 % Apply to affected area 1-2 times daily  . vitamin B-12 (CYANOCOBALAMIN) 1000 MCG tablet Take 2,500 mcg by mouth daily. Reported on 09/28/2015   Current Facility-Administered Medications for the 08/20/19 encounter (Office Visit) with Gatha Mayer, MD  Medication  . 0.9 %  sodium chloride infusion   Past Medical History:  Diagnosis Date  .  vitamin D deficiency 01/24/2013   01/23/13 - level 29 -  supplements 50k IU weekly x 6 recommended   . Anabolic steroid abuse    PT. DENIES 03/30/19  . Arthritis   . Avascular necrosis of femoral head (HCC)    bilateral  . Bleeding hemorrhoids   . Colitis 08/06/2013   Mildly actibr colitis  . Diabetes mellitus without complication ([place]) 7681   type 2  . Drug-induced acute pancreatitis - 6MP 09/07/2015  . Erectile dysfunction   . Hypertension   . Left sided ulcerative colitis ([place])   . Psoriasis caused by Humira 04/13/2019  . Spinal stenosis of lumbar region at multiple levels    Past Surgical History:  Procedure Laterality Date  . COLONOSCOPY     multiple  . KNEE ARTHROSCOPY Right   .  SIGMOIDOSCOPY     Social History   Social History Narrative   Single never married. 1 son 50 in 2018. 1 grandson 66 in 2018. 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   Review of Systems As above  Objective:   Physical Exam BP (!) 144/80   Pulse 68   Temp 97.8 F (36.6 C)   Ht 5' 11"  (1.803 m)   Wt 259 lb (117.5 kg)   BMI 36.12 kg/m

## 2019-08-20 NOTE — Assessment & Plan Note (Signed)
Improved.  Keep follow-up with dermatology.

## 2019-08-20 NOTE — Assessment & Plan Note (Signed)
He is aware of potential side effects as outlined previously.  Unfortunately we are limited in options to control his ulcerative colitis flares.

## 2019-09-17 ENCOUNTER — Ambulatory Visit (INDEPENDENT_AMBULATORY_CARE_PROVIDER_SITE_OTHER): Payer: Medicare Other | Admitting: Family Medicine

## 2019-09-17 ENCOUNTER — Other Ambulatory Visit: Payer: Self-pay

## 2019-09-17 ENCOUNTER — Encounter: Payer: Self-pay | Admitting: Family Medicine

## 2019-09-17 VITALS — BP 144/92 | HR 73 | Temp 97.3°F | Ht 71.0 in | Wt 250.0 lb

## 2019-09-17 DIAGNOSIS — E559 Vitamin D deficiency, unspecified: Secondary | ICD-10-CM

## 2019-09-17 DIAGNOSIS — E785 Hyperlipidemia, unspecified: Secondary | ICD-10-CM | POA: Diagnosis not present

## 2019-09-17 DIAGNOSIS — Z87891 Personal history of nicotine dependence: Secondary | ICD-10-CM

## 2019-09-17 DIAGNOSIS — Z0000 Encounter for general adult medical examination without abnormal findings: Secondary | ICD-10-CM

## 2019-09-17 DIAGNOSIS — R918 Other nonspecific abnormal finding of lung field: Secondary | ICD-10-CM

## 2019-09-17 DIAGNOSIS — Z0001 Encounter for general adult medical examination with abnormal findings: Secondary | ICD-10-CM | POA: Diagnosis not present

## 2019-09-17 DIAGNOSIS — K5150 Left sided colitis without complications: Secondary | ICD-10-CM

## 2019-09-17 DIAGNOSIS — I10 Essential (primary) hypertension: Secondary | ICD-10-CM | POA: Diagnosis not present

## 2019-09-17 DIAGNOSIS — E119 Type 2 diabetes mellitus without complications: Secondary | ICD-10-CM | POA: Diagnosis not present

## 2019-09-17 DIAGNOSIS — E1159 Type 2 diabetes mellitus with other circulatory complications: Secondary | ICD-10-CM

## 2019-09-17 DIAGNOSIS — I152 Hypertension secondary to endocrine disorders: Secondary | ICD-10-CM

## 2019-09-17 DIAGNOSIS — Z125 Encounter for screening for malignant neoplasm of prostate: Secondary | ICD-10-CM

## 2019-09-17 DIAGNOSIS — E1169 Type 2 diabetes mellitus with other specified complication: Secondary | ICD-10-CM

## 2019-09-17 LAB — POC URINALSYSI DIPSTICK (AUTOMATED)
Bilirubin, UA: NEGATIVE
Blood, UA: NEGATIVE
Glucose, UA: NEGATIVE
Ketones, UA: NEGATIVE
Leukocytes, UA: NEGATIVE
Nitrite, UA: NEGATIVE
Protein, UA: POSITIVE — AB
Spec Grav, UA: >=1.03 — AB (ref 1.010–1.025)
Urobilinogen, UA: 0.2 E.U./dL
pH, UA: 6 (ref 5.0–8.0)

## 2019-09-17 LAB — CBC WITH DIFFERENTIAL/PLATELET
Basophils Absolute: 0 10*3/uL (ref 0.0–0.1)
Basophils Relative: 0.5 % (ref 0.0–3.0)
Eosinophils Absolute: 0.2 10*3/uL (ref 0.0–0.7)
Eosinophils Relative: 4.3 % (ref 0.0–5.0)
HCT: 41.2 % (ref 39.0–52.0)
Hemoglobin: 13.7 g/dL (ref 13.0–17.0)
Lymphocytes Relative: 38.1 % (ref 12.0–46.0)
Lymphs Abs: 1.9 10*3/uL (ref 0.7–4.0)
MCHC: 33.3 g/dL (ref 30.0–36.0)
MCV: 94.4 fl (ref 78.0–100.0)
Monocytes Absolute: 0.6 10*3/uL (ref 0.1–1.0)
Monocytes Relative: 12.4 % — ABNORMAL HIGH (ref 3.0–12.0)
Neutro Abs: 2.2 10*3/uL (ref 1.4–7.7)
Neutrophils Relative %: 44.7 % (ref 43.0–77.0)
Platelets: 225 10*3/uL (ref 150.0–400.0)
RBC: 4.37 Mil/uL (ref 4.22–5.81)
RDW: 13.9 % (ref 11.5–15.5)
WBC: 5 10*3/uL (ref 4.0–10.5)

## 2019-09-17 LAB — COMPREHENSIVE METABOLIC PANEL
ALT: 38 U/L (ref 0–53)
AST: 43 U/L — ABNORMAL HIGH (ref 0–37)
Albumin: 4.3 g/dL (ref 3.5–5.2)
Alkaline Phosphatase: 42 U/L (ref 39–117)
BUN: 12 mg/dL (ref 6–23)
CO2: 25 mEq/L (ref 19–32)
Calcium: 9.1 mg/dL (ref 8.4–10.5)
Chloride: 104 mEq/L (ref 96–112)
Creatinine, Ser: 0.87 mg/dL (ref 0.40–1.50)
GFR: 106.41 mL/min (ref >60.00)
Glucose, Bld: 112 mg/dL — ABNORMAL HIGH (ref 70–99)
Potassium: 4 mEq/L (ref 3.5–5.1)
Sodium: 138 mEq/L (ref 135–145)
Total Bilirubin: 0.9 mg/dL (ref 0.2–1.2)
Total Protein: 7.1 g/dL (ref 6.0–8.3)

## 2019-09-17 LAB — LIPID PANEL
Cholesterol: 149 mg/dL (ref 0–200)
HDL: 46.8 mg/dL (ref >39.00)
LDL Cholesterol: 91 mg/dL (ref 0–99)
NonHDL: 102.3
Total CHOL/HDL Ratio: 3
Triglycerides: 55 mg/dL (ref 0.0–149.0)
VLDL: 11 mg/dL (ref 0.0–40.0)

## 2019-09-17 LAB — VITAMIN D 25 HYDROXY (VIT D DEFICIENCY, FRACTURES): VITD: 60.02 ng/mL (ref 30.00–100.00)

## 2019-09-17 LAB — PSA: PSA: 0.76 ng/mL (ref 0.10–4.00)

## 2019-09-17 MED ORDER — AMLODIPINE BESYLATE 2.5 MG PO TABS: 2.5000 mg | ORAL_TABLET | Freq: Every day | ORAL | 3 refills | Status: DC

## 2019-09-17 NOTE — Progress Notes (Signed)
Phone: [phone]   Subjective:  Patient presents today for their annual physical. Chief complaint-noted.   See problem oriented charting- Review of Systems  Constitutional: Negative for chills and fever.  HENT: Negative for ear pain, hearing loss and tinnitus.   Eyes: Negative for blurred vision, double vision and photophobia.  Respiratory: Negative for cough, shortness of breath and wheezing.   Cardiovascular: Negative for chest pain, palpitations and leg swelling.  Gastrointestinal: Negative for constipation, diarrhea, heartburn, nausea and vomiting.  Genitourinary: Negative for dysuria, frequency and urgency.  Musculoskeletal: Positive for back pain. Negative for joint pain and neck pain.       History of back pain   Skin: Negative for rash.  Neurological: Negative for dizziness, weakness and headaches.  Endo/Heme/Allergies: Does not bruise/bleed easily.  Psychiatric/Behavioral: Negative for depression and suicidal ideas. The patient does not have insomnia.    The following were reviewed and entered/updated in epic: Past Medical History:  Diagnosis Date  .  vitamin D deficiency 01/24/2013   01/23/13 - level 29 - supplements 50k IU weekly x 6 recommended   . Anabolic steroid abuse    PT. DENIES 03/30/19  . Arthritis   . Avascular necrosis of femoral head (HCC)    bilateral  . Bleeding hemorrhoids   . Diabetes mellitus without complication ([place]) 8022   type 2  . Drug-induced acute pancreatitis - 6MP 09/07/2015  . Drug-induced pancreatitis    6 MP  . Erectile dysfunction   . Hypertension   . Left sided ulcerative colitis ([place])   . Psoriasis caused by Humira 04/13/2019  . Spinal stenosis of lumbar region at multiple levels    Patient Active Problem List   Diagnosis Date Noted  . Diabetes mellitus type II, controlled ([place]) 12/22/2018    Priority: High  . LEFT SIDED ULCERATIVE COLITIS 08/11/2008    Priority: High  . Hyperlipidemia associated with type 2 diabetes mellitus  ([place]) 01/20/2019    Priority: Medium  . Hypertension associated with diabetes ([place]) 01/20/2019    Priority: Medium  . Pulmonary nodules 09/07/2015    Priority: Medium  . Long term current use of systemic steroids 11/18/2014    Priority: Low  . Avascular necrosis of femoral heads 11/18/2014    Priority: Low  . Vitamin D deficiency 01/24/2013    Priority: Low  . ED (erectile dysfunction) 05/21/2012    Priority: Low  . Psoriasis caused by Humira 04/13/2019  . AKI (acute kidney injury) ([place]) 12/25/2018  . Rhabdomyolysis 12/25/2018   Past Surgical History:  Procedure Laterality Date  . COLONOSCOPY     multiple  . KNEE ARTHROSCOPY Right   . SIGMOIDOSCOPY      Family History  Problem Relation Age of Onset  . Diabetes Mother   . Diabetes Other        cousin  . Hypertension Other   . Diabetes type I Son   . Colon cancer Neg Hx   . Esophageal cancer Neg Hx   . Rectal cancer Neg Hx   . Stomach cancer Neg Hx   . Colon polyps Neg Hx     Medications- reviewed and updated Current Outpatient Medications  Medication Sig Dispense Refill  . ACCU-CHEK AVIVA PLUS test strip TEST 4 TIMES A DAY AS DIRECTED BY PRESCIBER 350 strip 3  . Accu-Chek Softclix Lancets lancets TEST 4 TIMES DAILY 300 each 0  . Ascorbic Acid (VITAMIN C) 1000 MG tablet Take 1,000 mg by mouth daily. Reported on 09/28/2015    .  aspirin 81 MG tablet Take 81 mg by mouth daily. Reported on 09/28/2015    . atorvastatin (LIPITOR) 20 MG tablet Take 1 tablet (20 mg total) by mouth once a week. (Patient taking differently: Take 20 mg by mouth every Sunday. ) 90 tablet 3  . blood glucose meter kit and supplies Dispense based on patient and insurance preference. Use up to four times daily as directed. (FOR ICD-9 250.00, 250.01). 1 each 0  . Calcium Carbonate-Vitamin D (CALCIUM-VITAMIN D3 PO) Take 1 tablet by mouth daily. Reported on 09/28/2015    . Cholecalciferol (VITAMIN D-3 PO) Take 1 capsule by mouth daily with breakfast.    .  magnesium chloride (SLOW-MAG) 64 MG TBEC SR tablet Take 1 tablet (64 mg total) by mouth daily. 5 tablet 0  . mesalamine (LIALDA) 1.2 g EC tablet Take 2 tablets (2.4 g total) by mouth 2 (two) times daily. 120 tablet 11  . metFORMIN (GLUCOPHAGE) 1000 MG tablet Take 1 tablet (1,000 mg total) by mouth 2 (two) times daily with a meal. 180 tablet 3  . Multiple Vitamins-Minerals (CENTRUM SILVER PO) Take 1 tablet by mouth daily. Reported on 09/28/2015    . Omega-3 Fatty Acids (FISH OIL) 300 MG CAPS Take 300 mg by mouth daily.     . vitamin B-12 (CYANOCOBALAMIN) 1000 MCG tablet Take 2,500 mcg by mouth daily. Reported on 09/28/2015    . amLODipine (NORVASC) 2.5 MG tablet Take 1 tablet (2.5 mg total) by mouth daily. 90 tablet 3   No current facility-administered medications for this visit.    Allergies-reviewed and updated Allergies  Allergen Reactions  . Humira [Adalimumab] Rash    psoriasis  . Mercaptopurine Other (See Comments)    Caused Pancreatitis    Social History   Social History Narrative   Single never married. 1 son 24 in 2018. 1 grandson 60 in 2018. Lives in 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 TM normal. Bridge of nose normal. Eyelids normal.  Neck: no thyromegaly or cervical lymphadenopathy  CV: RRR no murmurs rubs or gallops Lungs: CTAB no crackles, wheeze, rhonchi Abdomen: soft/nontender/nondistended/normal bowel sounds. No rebound or guarding.  Ext: no edema Skin: warm, dry Neuro: grossly normal, moves all extremities, PERRLA   Diabetic Foot Exam - Simple   Simple Foot Form Diabetic Foot exam was performed with the  following findings: Yes 09/17/2019 10:47 AM  Visual Inspection See comments: Yes Sensation Testing Intact to touch and monofilament testing bilaterally: Yes Pulse Check Posterior Tibialis and Dorsalis pulse intact bilaterally: Yes Comments Still having left over raw areas on both feet from reaction to medications        Assessment and Plan  64 y.o. male presenting for annual physical.  Health Maintenance counseling: 1. Anticipatory guidance: Patient counseled regarding regular dental exams q6 months normally. Has been a year this time. Eye exams -yearly, Due now will call of appointment.   Avoiding smoking and second hand smoke yes , limiting alcohol to 2 beverages per  day -.  Drinks at special occasions only 2. Risk factor reduction:  Advised patient of need for regular exercise and diet rich and fruits and vegetables to reduce risk of heart attack and stroke. Exercise-  has not due to covid, strongly encouraged exercise even at home. Would like to return to gym after second covid. Diet- Tries to have healthy diet. Does snack with TV.  Weight trending up 3 lbs from last visit. Feels like could could cut down on later snacking.  Wt Readings from Last 3 Encounters:  09/17/19 250 lb (113.4 kg)  08/20/19 259 lb (117.5 kg)  06/15/19 261 lb (118.4 kg)  3. Immunizations/screenings/ancillary studies- "Call us with dates from your covid vaccine on Monday after you receive the 2nd one. ". Also due for shingrix and  prevnar- can give prevnar in office but would need to receive Shingrix at pharmacy Immunization History  Administered Date(s) Administered  . Influenza Split 02/15/2011  . Influenza, Seasonal, Injecte, Preservative Fre 05/21/2012  . Influenza,inj,Quad PF,6+ Mos 06/03/2013  . Td 06/04/2001  . Tdap 05/21/2012   4. Prostate cancer screening-we will trend PSA yearly-update PSA with labs today Lab Results  Component Value Date   PSA 0.90 12/04/2018   PSA 1.17 09/11/2017   PSA 0.98  09/07/2016   5. Colon cancer screening -  next due 04/12/2020. Last seen about 2 months ago has 6 month follow up.  More regular colonoscopies due to ulcerative colitis 6. Skin cancer screening- followed by Dermatology after psoriasis from Humira.   Advised regular sunscreen use. Denies worrisome, changing, or new skin lesions- other than psoriatic issues 7.  former smoker- quit 2002.  Will get UA.  Also now eligible for aortic aneurysm screening.  We reviewed CT abdomen pelvis from August 19, 2015 without mention of aneurysm-patient chooses to decline   Status of chronic or acute concerns  # Diabetes S: Compliant with Metformin 1000 twice daily CBGs- fasting today 123.  Exercise and diet- Limiting sacks and would like to go back to Gym soon.  Lab Results  Component Value Date   HGBA1C 6.3 (A) 06/15/2019   HGBA1C 5.9 (A) 02/23/2019   HGBA1C 6.7 (H) 12/04/2018   A/P: Weight is trending up-patient needs to reverse this trend.  He will continue follow-up with endocrinology  #hypertension S: Currently not on any medications. Amlodipine stopped at last visit due to rash. But was discovered cause to be Humira. Patient denies any chest pain, shortness of breath, changes or changes in vision. Does not add salt to food. Tries to maintain a heart healthy diet.  BP Readings from Last 3 Encounters:  09/17/19 (!) 144/92  08/20/19 (!) 144/80  06/15/19 130/88  A/P: Blood pressure poorly controlled-we need to restart amlodipine at this time at 2.5 mg since rash was ultimately discovered to be related to Humira.  He sees his endocrinologist next month-if blood pressure remains over 140/90 he will let me know and I can make further adjustments.  If it is well controlled we can continue current medicine and simply follow-up in 6 months  #hyperlipidemia S: compliant with Lipitor 24m once a week. Denies any cramping from medications.  Lab Results  Component Value Date   CHOL 177 12/04/2018   HDL 84.10  12/04/2018   LDLCALC 81 12/04/2018   LDLDIRECT 65.0 03/17/2018   TRIG 58.0 12/04/2018   CHOLHDL 2 12/04/2018   A/P: Last LDL slightly above goal-update lipid panel today.  Consider Lipitor 40 mg once a week if  remains above goal  #Ulcerative colitis-patient on Lialda now.  Controlling symptoms reasonably well.  Off Humira due to psoriasis eruption -Patient continues to have issues with psoriasis even after coming off of Humira (6 months after starting) - He has tried several creams and is only seen slow improvement.  He also has noted brittle nails and lines in his nails associated with the psoriasis.  I encouraged him to schedule a follow-up with dermatology as I would like for him to make more significant progress and feel better.  #Pulmonary nodule-patient with regular follow-up with pulmonology-she has had CT scan in November-we will continue to trend yearly.  #Vitamin D deficiency-patient currently taking cholecalciferol probably 1000 units -Patient also chooses to take B12 metformin  Recommended follow up: Return in about 1 month (around 10/17/2019) for Blood pressure follow-up back on amlodipine.  or 6 months if blood presure normal at endocrine. Future Appointments  Date Time Provider [place]  10/12/2019  9:30 AM Renato Shin, MD [facility] None   Lab/Order associations: fasting   ICD-10-CM   1. Preventative health care  Z00.00 CBC with Differential/Platelet    Comprehensive metabolic panel    Lipid panel    Hemoglobin A1c    PSA    VITAMIN D 25 Hydroxy (Vit-D Deficiency, Fractures)  2. Screening for prostate cancer  Z12.5 PSA  3. Hyperlipidemia, unspecified hyperlipidemia type  E78.5 CBC with Differential/Platelet    Comprehensive metabolic panel    Lipid panel  4. Controlled type 2 diabetes mellitus without complication, without long-term current use of insulin (HCC)  E11.9 CBC with Differential/Platelet    Comprehensive metabolic panel    Lipid panel     Hemoglobin A1c    POCT Urinalysis Dipstick (Automated)  5. Hypertension associated with diabetes ([place])  E11.59 POCT Urinalysis Dipstick (Automated)   I10   6. LEFT SIDED ULCERATIVE COLITIS  K51.50   7. Pulmonary nodules  R91.8   8. Hyperlipidemia associated with type 2 diabetes mellitus (HCC)  E11.69    E78.5   9. Vitamin D deficiency  E55.9 VITAMIN D 25 Hydroxy (Vit-D Deficiency, Fractures)    Meds ordered this encounter  Medications  . amLODipine (NORVASC) 2.5 MG tablet    Sig: Take 1 tablet (2.5 mg total) by mouth daily.    Dispense:  90 tablet    Refill:  3    Return precautions advised.  [name], MD

## 2019-09-17 NOTE — Addendum Note (Signed)
Addended by: Francis Dowse T on: 09/17/2019 11:34 AM   Modules accepted: Orders

## 2019-09-17 NOTE — Patient Instructions (Addendum)
Health Maintenance Due  Topic Date Due  . PNA vac Low Risk Adult (1 of 2 - PCV13) will update  after finish covid . Also discuss shingrix future visit Never done   Call us with dates from your covid vaccine on Monday after you receive the 2nd one.   Make sure to schedule your updated diabetic eye exam yearly   Blood pressure poorly controlled-we need to restart amlodipine at this time at 2.5 mg since rash was ultimately discovered to be related to Humira.  He sees his endocrinologist next month-if blood pressure remains over 140/90 he will let me know and I can make further adjustments.  If it is well controlled we can continue current medicine and simply follow-up in 6 months  Recommended follow up: Return in about 1 month (around 10/17/2019) for Blood pressure follow-up back on amlodipine.  or 6 months if blood presure normal at endocrine.

## 2019-09-18 ENCOUNTER — Other Ambulatory Visit: Payer: Self-pay

## 2019-09-18 MED ORDER — ATORVASTATIN CALCIUM 40 MG PO TABS: 40.0000 mg | ORAL_TABLET | Freq: Every day | ORAL | 3 refills | Status: DC

## 2019-09-18 NOTE — Progress Notes (Signed)
Done

## 2019-09-22 ENCOUNTER — Telehealth: Payer: Self-pay | Admitting: Family Medicine

## 2019-09-22 NOTE — Telephone Encounter (Signed)
Added to chart

## 2019-09-22 NOTE — Telephone Encounter (Signed)
Pt called stating he received his second Moderna Vaccine yesterday 09/21/2019.

## 2019-09-29 ENCOUNTER — Telehealth: Payer: Self-pay | Admitting: Internal Medicine

## 2019-09-29 MED ORDER — MESALAMINE 1.2 G PO TBEC: 2.4000 g | DELAYED_RELEASE_TABLET | Freq: Two times a day (BID) | ORAL | 5 refills | Status: DC

## 2019-09-29 NOTE — Telephone Encounter (Signed)
I spoke with Angel Frazier and informed him that Angel Frazier assistance program has been contacted at # [phone] and his mesalamine (Lialda) 1.2 EC tablet has been reordered. They will ship it to him in the next several days.

## 2019-10-12 ENCOUNTER — Other Ambulatory Visit: Payer: Self-pay

## 2019-10-12 ENCOUNTER — Ambulatory Visit (INDEPENDENT_AMBULATORY_CARE_PROVIDER_SITE_OTHER): Payer: Medicare Other | Admitting: Endocrinology

## 2019-10-12 ENCOUNTER — Encounter: Payer: Self-pay | Admitting: Endocrinology

## 2019-10-12 VITALS — BP 160/98 | HR 82 | Ht 71.0 in | Wt 248.0 lb

## 2019-10-12 DIAGNOSIS — E119 Type 2 diabetes mellitus without complications: Secondary | ICD-10-CM

## 2019-10-12 DIAGNOSIS — E109 Type 1 diabetes mellitus without complications: Secondary | ICD-10-CM | POA: Diagnosis not present

## 2019-10-12 LAB — POCT GLYCOSYLATED HEMOGLOBIN (HGB A1C): Hemoglobin A1C: 6.1 % — AB (ref 4.0–5.6)

## 2019-10-12 NOTE — Patient Instructions (Addendum)
Your blood pressure is high today.  Please see your primary care provider soon, to have it rechecked.   check your blood sugar once a day.  vary the time of day when you check, between before the 3 meals, and at bedtime.  also check if you have symptoms of your blood sugar being too high or too low.  please keep a record of the readings and bring it to your next appointment here (or you can bring the meter itself).  You can write it on any piece of paper.  please call us sooner if your blood sugar goes below 70, or if you have a lot of readings over 200.  Please continue the same metformin.  Please come back for a follow-up appointment in 4-5 months.

## 2019-10-12 NOTE — Progress Notes (Signed)
Subjective:    Patient ID: Angel Frazier, male    DOB: 03/08/1956, 64 y.o.   MRN: [id number]  HPI Pt returns for f/u of diabetes mellitus: DM type: 1, in remission (AKA atypical DM, intermittently ketosis-prone DM, etc).   Dx'ed: 4193 Complications: none.   Therapy: metformin.    DKA: once, at dx Severe hypoglycemia: never. Pancreatitis: never Pancreatic imaging: normal on 2017 CT Other: he has been in remission since 2017. He took insulin for a brief time after dx; edema limits rx options.  Interval history: pt says cbg's are well-controlled.  He takes metformin as rx'ed.  Past Medical History:  Diagnosis Date  .  vitamin D deficiency 01/24/2013   01/23/13 - level 29 - supplements 50k IU weekly x 6 recommended   . Anabolic steroid abuse    PT. DENIES 03/30/19  . Arthritis   . Avascular necrosis of femoral head (HCC)    bilateral  . Bleeding hemorrhoids   . Diabetes mellitus without complication ([place]) 7902   type 2  . Drug-induced acute pancreatitis - 6MP 09/07/2015  . Drug-induced pancreatitis    6 MP  . Erectile dysfunction   . Hypertension   . Left sided ulcerative colitis ([place])   . Psoriasis caused by Humira 04/13/2019  . Spinal stenosis of lumbar region at multiple levels     Past Surgical History:  Procedure Laterality Date  . COLONOSCOPY     multiple  . KNEE ARTHROSCOPY Right   . SIGMOIDOSCOPY      Social History   Socioeconomic History  . Marital status: Single    Spouse name: Not on file  . Number of children: 1  . Years of education: Not on file  . Highest education level: Not on file  Occupational History  . Occupation: retired    Fish farm manager: Dentist & [employer]  Tobacco Use  . Smoking status: Former Smoker    Packs/day: 1.00    Years: 6.00    Pack years: 6.00    Types: Cigarettes    Quit date: 06/04/2000    Years since quitting: 19.3  . Smokeless tobacco: Never Used  Substance and Sexual Activity  . Alcohol use: Yes    Comment: occasionally    . Drug use: No  . Sexual activity: Not on file  Other Topics Concern  . Not on file  Social History Narrative   Single never married. 1 son 55 in 2018. 1 grandson 110 in 2018. 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 Difficulty of Paying Living Expenses:   Food Insecurity:   . Worried About Charity fundraiser in the Last Year:   . Arboriculturist in the Last Year:   Transportation Needs:   . Film/video editor (Medical):   Marland Kitchen Lack of Transportation (Non-Medical):   Physical Activity:   . Days of Exercise per Week:   . Minutes of Exercise per Session:   Stress:   . Feeling of Stress :   Social Connections:   . Frequency of Communication with Friends and Family:   . Frequency of Social Gatherings with Friends and Family:   . Attends Religious Services:   . Active Member of Clubs or Organizations:   .  Attends Archivist Meetings:   Marland Kitchen Marital Status:   Intimate Partner Violence:   . Fear of Current or Ex-Partner:   . Emotionally Abused:   Marland Kitchen Physically Abused:   . Sexually Abused:     Current Outpatient Medications on File Prior to Visit  Medication Sig Dispense Refill  . ACCU-CHEK AVIVA PLUS test strip TEST 4 TIMES A DAY AS DIRECTED BY PRESCIBER 350 strip 3  . Accu-Chek Softclix Lancets lancets TEST 4 TIMES DAILY 300 each 0  . amLODipine (NORVASC) 2.5 MG tablet Take 1 tablet (2.5 mg total) by mouth daily. 90 tablet 3  . Ascorbic Acid (VITAMIN C) 1000 MG tablet Take 1,000 mg by mouth daily. Reported on 09/28/2015    . aspirin 81 MG tablet Take 81 mg by mouth daily. Reported on 09/28/2015    . atorvastatin (LIPITOR) 40 MG tablet Take 1 tablet (40 mg total) by mouth daily. 13 tablet 3  . blood glucose meter kit and supplies Dispense based on patient and insurance preference. Use up  to four times daily as directed. (FOR ICD-9 250.00, 250.01). 1 each 0  . Calcium Carbonate-Vitamin D (CALCIUM-VITAMIN D3 PO) Take 1 tablet by mouth daily. Reported on 09/28/2015    . Cholecalciferol (VITAMIN D-3 PO) Take 1 capsule by mouth daily with breakfast.    . magnesium chloride (SLOW-MAG) 64 MG TBEC SR tablet Take 1 tablet (64 mg total) by mouth daily. 5 tablet 0  . mesalamine (LIALDA) 1.2 g EC tablet Take 2 tablets (2.4 g total) by mouth 2 (two) times daily. 120 tablet 5  . metFORMIN (GLUCOPHAGE) 1000 MG tablet Take 1 tablet (1,000 mg total) by mouth 2 (two) times daily with a meal. 180 tablet 3  . Multiple Vitamins-Minerals (CENTRUM SILVER PO) Take 1 tablet by mouth daily. Reported on 09/28/2015    . Omega-3 Fatty Acids (FISH OIL) 300 MG CAPS Take 300 mg by mouth daily.     . vitamin B-12 (CYANOCOBALAMIN) 1000 MCG tablet Take 2,500 mcg by mouth daily. Reported on 09/28/2015     No current facility-administered medications on file prior to visit.    Allergies  Allergen Reactions  . Humira [Adalimumab] Rash    psoriasis  . Mercaptopurine Other (See Comments)    Caused Pancreatitis    Family History  Problem Relation Age of Onset  . Diabetes Mother   . Diabetes Other        cousin  . Hypertension Other   . Diabetes type I Son   . Colon cancer Neg Hx   . Esophageal cancer Neg Hx   . Rectal cancer Neg Hx   . Stomach cancer Neg Hx   . Colon polyps Neg Hx     BP (!) 160/98   Pulse 82   Ht _0  (1.803 m)   Wt 248 lb (112.5 kg)   SpO2 98%   BMI 34.59 kg/m    Review of Systems He denies hypoglycemia.      Objective:   Physical Exam VITAL SIGNS:  See vs page GENERAL: no distress Pulses: dorsalis pedis intact bilat.   MSK: no deformity of the feet CV: trace bilat leg edema Skin:  no ulcer on the feet.  normal color and temp on the feet. Neuro: sensation is intact to touch on the feet.    Lab Results  Component Value Date   HGBA1C 6.1 (A) 10/12/2019         Assessment & Plan:  HTN: is noted  today Type 1 DM, in remission.  Stable Edema: This limits rx options.    Patient Instructions  Your blood pressure is high today.  Please see your primary care provider soon, to have it rechecked.   check your blood sugar once a day.  vary the time of day when you check, between before the 3 meals, and at bedtime.  also check if you have symptoms of your blood sugar being too high or too low.  please keep a record of the readings and bring it to your next appointment here (or you can bring the meter itself).  You can write it on any piece of paper.  please call us sooner if your blood sugar goes below 70, or if you have a lot of readings over 200.  Please continue the same metformin.  Please come back for a follow-up appointment in 4-5 months.

## 2019-10-14 ENCOUNTER — Ambulatory Visit: Payer: Medicare Other | Admitting: Dermatology

## 2019-10-14 ENCOUNTER — Encounter: Payer: Self-pay | Admitting: Dermatology

## 2019-10-14 ENCOUNTER — Other Ambulatory Visit: Payer: Self-pay

## 2019-10-14 ENCOUNTER — Telehealth: Payer: Self-pay | Admitting: Internal Medicine

## 2019-10-14 DIAGNOSIS — L409 Psoriasis, unspecified: Secondary | ICD-10-CM | POA: Diagnosis not present

## 2019-10-14 NOTE — Telephone Encounter (Signed)
I called and spoke to [facility] customer service and they said they told us in error on 09/29/2019 that his lialda was being shipped out. The verbal order was given to the pharmacist today and she will rush the order. Angel Frazier has been informed of this and I apologized to him.

## 2019-10-18 NOTE — Progress Notes (Signed)
   Follow-Up Visit   Subjective  Angel Frazier is a 64 y.o. male who presents for the following: Rash (here to follow up on rash on hands and feet. Angel Frazier thought it was from the [place]. Some better Tx clobetasol cream, TAC. Patient not using anything now. ).  Psoriasis Location: Worst on hands and feet Duration:  Quality: Several l years Associated Signs/Symptoms: Improved Modifying Factors:  Severity:  Timing: Context:   The following portions of the chart were reviewed this encounter and updated as appropriate: Tobacco  Allergies  Meds  Problems  Med Hx  Surg Hx  Fam Hx      Objective  Well appearing patient in no apparent distress; mood and affect are within normal limits.  A focused examination was performed including Face, scalp, neck, hands, feet, nails. Relevant physical exam findings are noted in the Assessment and Plan.   Assessment & Plan  Psoriasis (2) Left Hand - Anterior; Right Hand - Anterior  Plain petroleum jelly under nonocclusive gloves overnight for 2 weeks and then call

## 2019-10-22 NOTE — Progress Notes (Signed)
Phone [phone] In person visit   Subjective:   Angel Frazier is a 64 y.o. year old very pleasant male patient who presents for/with See problem oriented charting Chief Complaint  Patient presents with  . Hypertension  . Hyperlipidemia  . Diabetes   This visit occurred during the SARS-CoV-2 public health emergency.  Safety protocols were in place, including screening questions prior to the visit, additional usage of staff PPE, and extensive cleaning of exam room while observing appropriate contact time as indicated for disinfecting solutions.   Past Medical History-  Patient Active Problem List   Diagnosis Date Noted  . Diabetes mellitus type II, controlled ([place]) 12/22/2018    Priority: High  . LEFT SIDED ULCERATIVE COLITIS 08/11/2008    Priority: High  . Hyperlipidemia associated with type 2 diabetes mellitus ([place]) 01/20/2019    Priority: Medium  . Hypertension associated with diabetes ([place]) 01/20/2019    Priority: Medium  . Pulmonary nodules 09/07/2015    Priority: Medium  . Former smoker 09/17/2019    Priority: Low  . Long term current use of systemic steroids 11/18/2014    Priority: Low  . Avascular necrosis of femoral heads 11/18/2014    Priority: Low  . Vitamin D deficiency 01/24/2013    Priority: Low  . ED (erectile dysfunction) 05/21/2012    Priority: Low  . Psoriasis caused by Humira 04/13/2019  . AKI (acute kidney injury) ([place]) 12/25/2018  . Rhabdomyolysis 12/25/2018    Medications- reviewed and updated Current Outpatient Medications  Medication Sig Dispense Refill  . ACCU-CHEK AVIVA PLUS test strip TEST 4 TIMES A DAY AS DIRECTED BY PRESCIBER 350 strip 3  . Accu-Chek Softclix Lancets lancets TEST 4 TIMES DAILY 300 each 0  . amLODipine (NORVASC) 2.5 MG tablet Take 1 tablet (2.5 mg total) by mouth daily. 90 tablet 3  . Ascorbic Acid (VITAMIN C) 1000 MG tablet Take 1,000 mg by mouth daily. Reported on 09/28/2015    . aspirin 81 MG tablet Take 81 mg by mouth  daily. Reported on 09/28/2015    . atorvastatin (LIPITOR) 40 MG tablet Take 1 tablet (40 mg total) by mouth daily. 13 tablet 3  . blood glucose meter kit and supplies Dispense based on patient and insurance preference. Use up to four times daily as directed. (FOR ICD-9 250.00, 250.01). 1 each 0  . Calcium Carbonate-Vitamin D (CALCIUM-VITAMIN D3 PO) Take 1 tablet by mouth daily. Reported on 09/28/2015    . Cholecalciferol (VITAMIN D-3 PO) Take 1 capsule by mouth daily with breakfast.    . magnesium chloride (SLOW-MAG) 64 MG TBEC SR tablet Take 1 tablet (64 mg total) by mouth daily. 5 tablet 0  . mesalamine (LIALDA) 1.2 g EC tablet Take 2 tablets (2.4 g total) by mouth 2 (two) times daily. 120 tablet 5  . metFORMIN (GLUCOPHAGE) 1000 MG tablet Take 1 tablet (1,000 mg total) by mouth 2 (two) times daily with a meal. 180 tablet 3  . Multiple Vitamins-Minerals (CENTRUM SILVER PO) Take 1 tablet by mouth daily. Reported on 09/28/2015    . Omega-3 Fatty Acids (FISH OIL) 300 MG CAPS Take 300 mg by mouth daily.     . vitamin B-12 (CYANOCOBALAMIN) 1000 MCG tablet Take 2,500 mcg by mouth daily. Reported on 09/28/2015     No current facility-administered medications for this visit.     Objective:  BP (!) 142/88   Pulse 77   Temp 98.1 F (36.7 C) (Temporal)   Ht _0  (1.803 m)  Wt 243 lb 6.4 oz (110.4 kg)   SpO2 96%   BMI 33.95 kg/m  Gen: NAD, resting comfortably CV: RRR no murmurs rubs or gallops Lungs: CTAB no crackles, wheeze, rhonchi Ext: no edema Skin: warm, dry    Assessment and Plan   #hypertension S: medication: Amlodipine 2.25m Home readings #s:  last week at home was 150/100. He has had a large range of readings does not feel like it is stable at this time. Patient denies any chest pain, shortness of breath, changes or changes in vision. Does not add salt to food. Tries to maintain a heart healthy diet.  He is losing weight to try to help.  BP Readings from Last 3 Encounters:   10/26/19 (!) 142/88  10/12/19 (!) 160/98  09/17/19 (!) 144/92  A/P: from avs "Poor control of blood pressure today.  We are going to increase amlodipine to 5 mg at bedtime.  He is going to take 2 of his current medications at bedtime of the 2.5 mg pills until he runs out then pick up the new prescription of amlodipine 5 mg" -considering getting back into gym- still somewhat cautious with covid 19  - down 18 lbs from January  #hyperlipidemia S: Medication: compliant with Lipitor 434monce a week increased from 2039mn sundays. Denies any cramping from medications.  Lab Results  Component Value Date   CHOL 149 09/17/2019   HDL 46.80 09/17/2019   LDLCALC 91 09/17/2019   LDLDIRECT 65.0 03/17/2018   TRIG 55.0 09/17/2019   CHOLHDL 3 09/17/2019   A/P: Hopefully improving control-likely repeat at least LDL at next visit and may need to increase medication to twice a week.  On the other hand he is going to be working on [employer] and hopefully with that we can keep him controlled.   % Diabetes- Medication: Metformin 1000 twice daily. Followed by endocrinology. A1c looked great recently Lab Results  Component Value Date   HGBA1C 6.1 (A) 10/12/2019   HGBA1C 6.3 (A) 06/15/2019   HGBA1C 5.9 (A) 02/23/2019    Possibly recheck bloodwork next visit and also Recheck UA for proteinuria-increase hydration first    Recommended follow up: Return in about 2 months (around 12/26/2019) for follow up- or sooner if needed. Future Appointments  Date Time Provider [place]Gr[place]/13/2021  8:00 AM EllRenato ShinD [facility] None  03/14/2020  9:40 AM HunYong ChanneleBrayton MarsD [facility]   Lab/Order associations:   ICD-10-CM   1. Hypertension associated with diabetes (HCC[place]E11.59    I10   2. Hyperlipidemia, unspecified hyperlipidemia type  E78.5   3. Controlled type 2 diabetes mellitus without complication, without long-term current use of insulin (HCC)  E11.9    Meds  ordered this encounter  Medications  . amLODipine (NORVASC) 5 MG tablet    Sig: Take 1 tablet (5 mg total) by mouth at bedtime.    Dispense:  90 tablet    Refill:  3  . atorvastatin (LIPITOR) 40 MG tablet    Sig: Take 1 tablet (40 mg total) by mouth once a week.    Dispense:  13 tablet    Refill:  3   Return precautions advised.  SteGarret ReddishD

## 2019-10-22 NOTE — Patient Instructions (Addendum)
Health Maintenance Due  Topic Date Due  . PNA vac Low Risk Adult (1 of 2 - PCV13) declines today  Never done   Bring home cuff to next visit so that we can compare in office.   We want home blood pressures to be 138/88 or less- under 130/80 is even more ideal.   Poor control of blood pressure today.  We are going to increase amlodipine to 5 mg at bedtime.  He is going to take 2 of his current medications at bedtime of the 2.5 mg pills until he runs out then pick up the new prescription of amlodipine 5 mg  Possibly recheck bloodwork next visit

## 2019-10-26 ENCOUNTER — Ambulatory Visit (INDEPENDENT_AMBULATORY_CARE_PROVIDER_SITE_OTHER): Payer: Medicare Other | Admitting: Family Medicine

## 2019-10-26 ENCOUNTER — Encounter: Payer: Self-pay | Admitting: Family Medicine

## 2019-10-26 ENCOUNTER — Other Ambulatory Visit: Payer: Self-pay

## 2019-10-26 VITALS — BP 142/88 | HR 77 | Temp 98.1°F | Ht 71.0 in | Wt 243.4 lb

## 2019-10-26 DIAGNOSIS — E1159 Type 2 diabetes mellitus with other circulatory complications: Secondary | ICD-10-CM

## 2019-10-26 DIAGNOSIS — I10 Essential (primary) hypertension: Secondary | ICD-10-CM

## 2019-10-26 DIAGNOSIS — E785 Hyperlipidemia, unspecified: Secondary | ICD-10-CM

## 2019-10-26 DIAGNOSIS — I152 Hypertension secondary to endocrine disorders: Secondary | ICD-10-CM

## 2019-10-26 DIAGNOSIS — E119 Type 2 diabetes mellitus without complications: Secondary | ICD-10-CM

## 2019-10-26 MED ORDER — AMLODIPINE BESYLATE 5 MG PO TABS: 5.0000 mg | ORAL_TABLET | Freq: Every day | ORAL | 3 refills | Status: DC

## 2019-10-26 MED ORDER — ATORVASTATIN CALCIUM 40 MG PO TABS: 40.0000 mg | ORAL_TABLET | ORAL | 3 refills | Status: DC

## 2019-12-02 ENCOUNTER — Ambulatory Visit: Payer: Medicare Other | Admitting: Internal Medicine

## 2019-12-02 ENCOUNTER — Encounter: Payer: Self-pay | Admitting: Internal Medicine

## 2019-12-02 VITALS — BP 140/84 | HR 80 | Ht 71.0 in | Wt 240.0 lb

## 2019-12-02 DIAGNOSIS — K5150 Left sided colitis without complications: Secondary | ICD-10-CM | POA: Diagnosis not present

## 2019-12-02 DIAGNOSIS — R634 Abnormal weight loss: Secondary | ICD-10-CM

## 2019-12-02 MED ORDER — PREDNISONE 10 MG PO TABS
ORAL_TABLET | ORAL | 0 refills | Status: AC
Start: 2019-12-02 — End: 2020-01-11

## 2019-12-02 NOTE — Assessment & Plan Note (Signed)
He has follow-up with Dr. Yong Channel regarding this and I will let him decide on any labs.  It has been since 2016 that a TSH was checked.  We will monitor.

## 2019-12-02 NOTE — Patient Instructions (Signed)
We have sent the following medications to your pharmacy for you to pick up at your convenience: Prednisone  We are giving you information to read on Entyvio. If you decide you want to try this before your next visit call us back so we can start the process.   Dr Carlean Purl would like to see you back in 6-8 weeks.   I appreciate the opportunity to care for you. Silvano Rusk, MD, [facility]

## 2019-12-02 NOTE — Progress Notes (Signed)
Angel Frazier 64 y.o. 1955-06-24 [id number]  Assessment & Plan:   LEFT SIDED ULCERATIVE COLITIS Treat with prednisone taper.  I mention the possibility of using Entyvio and have given some information.  He would likely need patient assistance.  I think that would be a good drug for him since he has been intolerant of 6-MP had psoriasis with Humira.  He is requiring prednisone several times a year on average unfortunately.  Continue Lialda for now keep in mind that Liotta may cause a paradoxical diarrhea and if he does not improve with his prednisone he is to call me.  I will see him in 6 to 8 weeks at which point we will regroup regarding different long-term therapy.  I also mentioned that a colectomy would be curative though in extreme treatment.  He is not interested in pursuing that.  Loss of weight He has follow-up with Dr. Yong Channel regarding this and I will let him decide on any labs.  It has been since 2016 that a TSH was checked.  We will monitor.  CC: [name], MD    Subjective:   Chief Complaint: Ulcerative colitis flare  HPI Angel Frazier is here with about 2 weeks of diarrhea.  Plus he has been losing weight.  He was eating less during period time but he says his weight has fluctuated somewhat.  Hemoglobin A1c was 6.1% so his blood sugars have been okay.  Slight spotting of blood at times he thinks this is a flare of his left-sided ulcerative colitis.  He was last seen in March and he was completing a prednisone taper at that time.  Recall that he is intolerant of 6-MP and he also developed psoriasis while on Humira.  He is currently back on Lialda.  He is due to see Dr. Yong Channel regarding his weight loss sometime soon.  Wt Readings from Last 3 Encounters:  12/02/19 240 lb (108.9 kg)  10/26/19 243 lb 6.4 oz (110.4 kg)  10/12/19 248 lb (112.5 kg)     Allergies  Allergen Reactions   Humira [Adalimumab] Rash    psoriasis   Mercaptopurine Other (See Comments)    Caused  Pancreatitis   Current Meds  Medication Sig   ACCU-CHEK AVIVA PLUS test strip TEST 4 TIMES A DAY AS DIRECTED BY PRESCIBER   Accu-Chek Softclix Lancets lancets TEST 4 TIMES DAILY   amLODipine (NORVASC) 5 MG tablet Take 1 tablet (5 mg total) by mouth at bedtime.   Ascorbic Acid (VITAMIN C) 1000 MG tablet Take 1,000 mg by mouth daily. Reported on 09/28/2015   aspirin 81 MG tablet Take 81 mg by mouth daily. Reported on 09/28/2015   atorvastatin (LIPITOR) 40 MG tablet Take 1 tablet (40 mg total) by mouth once a week.   blood glucose meter kit and supplies Dispense based on patient and insurance preference. Use up to four times daily as directed. (FOR ICD-9 250.00, 250.01).   Calcium Carbonate-Vitamin D (CALCIUM-VITAMIN D3 PO) Take 1 tablet by mouth daily. Reported on 09/28/2015   Cholecalciferol (VITAMIN D) 50 MCG (2000 UT) CAPS Take 1 capsule by mouth daily.   Cyanocobalamin (B-12 PO) 5074m daily   mesalamine (LIALDA) 1.2 g EC tablet Take 2 tablets (2.4 g total) by mouth 2 (two) times daily.   metFORMIN (GLUCOPHAGE) 1000 MG tablet Take 1 tablet (1,000 mg total) by mouth 2 (two) times daily with a meal.   Multiple Vitamins-Minerals (CENTRUM SILVER PO) Take 1 tablet by mouth daily. Reported on 09/28/2015  Omega-3 Fatty Acids (FISH OIL) 300 MG CAPS Take 300 mg by mouth daily.    Past Medical History:  Diagnosis Date    vitamin D deficiency 01/24/2013   01/23/13 - level 29 - supplements 50k IU weekly x 6 recommended    Anabolic steroid abuse    PT. DENIES 03/30/19   Arthritis    Avascular necrosis of femoral head (HCC)    bilateral   Bleeding hemorrhoids    Diabetes mellitus without complication ([place]) 4210   type 2   Drug-induced acute pancreatitis - 6MP 09/07/2015   Drug-induced pancreatitis    6 MP   Erectile dysfunction    Hypertension    Left sided ulcerative colitis ([place])    Psoriasis caused by Humira 04/13/2019   Spinal stenosis of lumbar region at multiple  levels    Past Surgical History:  Procedure Laterality Date   COLONOSCOPY     multiple   KNEE ARTHROSCOPY Right    SIGMOIDOSCOPY     Social History   Social History Narrative   Single never married. 1 son 92 in 2018. 1 grandson 21 in 2018. Lives in [place] alone      Disability- low back issues   Roll up machine operator      Hobbies: enjoys tinkering in his shed, enjoys going to the gym   family history includes Diabetes in his mother and another family member; Diabetes type I in his son; Hypertension in an other family member.   Review of Systems   Objective:   Physical Exam BP 140/84    Pulse 80    Ht 5' 11"  (1.803 m)    Wt 240 lb (108.9 kg)    BMI 33.47 kg/m

## 2019-12-02 NOTE — Assessment & Plan Note (Signed)
Treat with prednisone taper.  I mention the possibility of using Entyvio and have given some information.  He would likely need patient assistance.  I think that would be a good drug for him since he has been intolerant of 6-MP had psoriasis with Humira.  He is requiring prednisone several times a year on average unfortunately.  Continue Lialda for now keep in mind that Liotta may cause a paradoxical diarrhea and if he does not improve with his prednisone he is to call me.  I will see him in 6 to 8 weeks at which point we will regroup regarding different long-term therapy.  I also mentioned that a colectomy would be curative though in extreme treatment.  He is not interested in pursuing that.

## 2019-12-08 ENCOUNTER — Other Ambulatory Visit: Payer: Self-pay

## 2019-12-08 ENCOUNTER — Ambulatory Visit (INDEPENDENT_AMBULATORY_CARE_PROVIDER_SITE_OTHER): Payer: Medicare Other | Admitting: Family Medicine

## 2019-12-08 ENCOUNTER — Encounter: Payer: Self-pay | Admitting: Family Medicine

## 2019-12-08 VITALS — BP 130/82 | HR 96 | Temp 97.6°F | Ht 71.0 in | Wt 244.0 lb

## 2019-12-08 DIAGNOSIS — E119 Type 2 diabetes mellitus without complications: Secondary | ICD-10-CM

## 2019-12-08 DIAGNOSIS — E785 Hyperlipidemia, unspecified: Secondary | ICD-10-CM

## 2019-12-08 DIAGNOSIS — E1159 Type 2 diabetes mellitus with other circulatory complications: Secondary | ICD-10-CM | POA: Diagnosis not present

## 2019-12-08 DIAGNOSIS — R809 Proteinuria, unspecified: Secondary | ICD-10-CM

## 2019-12-08 DIAGNOSIS — I152 Hypertension secondary to endocrine disorders: Secondary | ICD-10-CM

## 2019-12-08 DIAGNOSIS — I10 Essential (primary) hypertension: Secondary | ICD-10-CM | POA: Diagnosis not present

## 2019-12-08 LAB — CBC WITH DIFFERENTIAL/PLATELET
Basophils Absolute: 0 10*3/uL (ref 0.0–0.1)
Basophils Relative: 0.3 % (ref 0.0–3.0)
Eosinophils Absolute: 0 10*3/uL (ref 0.0–0.7)
Eosinophils Relative: 0.2 % (ref 0.0–5.0)
HCT: 39.9 % (ref 39.0–52.0)
Hemoglobin: 13.3 g/dL (ref 13.0–17.0)
Lymphocytes Relative: 15.1 % (ref 12.0–46.0)
Lymphs Abs: 1.2 10*3/uL (ref 0.7–4.0)
MCHC: 33.5 g/dL (ref 30.0–36.0)
MCV: 95.4 fl (ref 78.0–100.0)
Monocytes Absolute: 0.4 10*3/uL (ref 0.1–1.0)
Monocytes Relative: 4.3 % (ref 3.0–12.0)
Neutro Abs: 6.5 10*3/uL (ref 1.4–7.7)
Neutrophils Relative %: 80.1 % — ABNORMAL HIGH (ref 43.0–77.0)
Platelets: 250 10*3/uL (ref 150.0–400.0)
RBC: 4.18 Mil/uL — ABNORMAL LOW (ref 4.22–5.81)
RDW: 13.4 % (ref 11.5–15.5)
WBC: 8.1 10*3/uL (ref 4.0–10.5)

## 2019-12-08 LAB — COMPREHENSIVE METABOLIC PANEL
ALT: 21 U/L (ref 0–53)
AST: 17 U/L (ref 0–37)
Albumin: 4.1 g/dL (ref 3.5–5.2)
Alkaline Phosphatase: 38 U/L — ABNORMAL LOW (ref 39–117)
BUN: 14 mg/dL (ref 6–23)
CO2: 29 mEq/L (ref 19–32)
Calcium: 9.5 mg/dL (ref 8.4–10.5)
Chloride: 98 mEq/L (ref 96–112)
Creatinine, Ser: 0.76 mg/dL (ref 0.40–1.50)
GFR: 124.28 mL/min (ref >60.00)
Glucose, Bld: 129 mg/dL — ABNORMAL HIGH (ref 70–99)
Potassium: 3.8 mEq/L (ref 3.5–5.1)
Sodium: 138 mEq/L (ref 135–145)
Total Bilirubin: 0.6 mg/dL (ref 0.2–1.2)
Total Protein: 7.1 g/dL (ref 6.0–8.3)

## 2019-12-08 LAB — LDL CHOLESTEROL, DIRECT: Direct LDL: 64 mg/dL

## 2019-12-08 LAB — MICROALBUMIN / CREATININE URINE RATIO
Creatinine,U: 148 mg/dL
Microalb Creat Ratio: 3.3 mg/g (ref 0.0–30.0)
Microalb, Ur: 4.9 mg/dL — ABNORMAL HIGH (ref 0.0–1.9)

## 2019-12-08 NOTE — Progress Notes (Signed)
Phone [phone] In person visit   Subjective:   Angel Frazier is a 64 y.o. year old very pleasant male patient who presents for/with See problem oriented charting Chief Complaint  Patient presents with  . Hypertension  . Diabetes   This visit occurred during the SARS-CoV-2 public health emergency.  Safety protocols were in place, including screening questions prior to the visit, additional usage of staff PPE, and extensive cleaning of exam room while observing appropriate contact time as indicated for disinfecting solutions.   Past Medical History-  Patient Active Problem List   Diagnosis Date Noted  . Diabetes mellitus type II, controlled ([place]) 12/22/2018    Priority: High  . LEFT SIDED ULCERATIVE COLITIS 08/11/2008    Priority: High  . Hyperlipidemia associated with type 2 diabetes mellitus ([place]) 01/20/2019    Priority: Medium  . Hypertension associated with diabetes ([place]) 01/20/2019    Priority: Medium  . Pulmonary nodules 09/07/2015    Priority: Medium  . Former smoker 09/17/2019    Priority: Low  . Long term current use of systemic steroids 11/18/2014    Priority: Low  . History of avascular necrosis  11/18/2014    Priority: Low  . Vitamin D deficiency 01/24/2013    Priority: Low  . ED (erectile dysfunction) 05/21/2012    Priority: Low  . Loss of weight 12/02/2019  . Psoriasis caused by Humira 04/13/2019  . Rhabdomyolysis 12/25/2018    Medications- reviewed and updated Current Outpatient Medications  Medication Sig Dispense Refill  . ACCU-CHEK AVIVA PLUS test strip TEST 4 TIMES A DAY AS DIRECTED BY PRESCIBER 350 strip 3  . Accu-Chek Softclix Lancets lancets TEST 4 TIMES DAILY 300 each 0  . amLODipine (NORVASC) 5 MG tablet Take 1 tablet (5 mg total) by mouth at bedtime. 90 tablet 3  . Ascorbic Acid (VITAMIN C) 1000 MG tablet Take 1,000 mg by mouth daily. Reported on 09/28/2015    . aspirin 81 MG tablet Take 81 mg by mouth daily. Reported on 09/28/2015    .  atorvastatin (LIPITOR) 40 MG tablet Take 1 tablet (40 mg total) by mouth once a week. 13 tablet 3  . blood glucose meter kit and supplies Dispense based on patient and insurance preference. Use up to four times daily as directed. (FOR ICD-9 250.00, 250.01). 1 each 0  . Calcium Carbonate-Vitamin D (CALCIUM-VITAMIN D3 PO) Take 1 tablet by mouth daily. Reported on 09/28/2015    . Cholecalciferol (VITAMIN D) 50 MCG (2000 UT) CAPS Take 1 capsule by mouth daily.    . Cyanocobalamin (B-12 PO) 5075m daily    . mesalamine (LIALDA) 1.2 g EC tablet Take 2 tablets (2.4 g total) by mouth 2 (two) times daily. 120 tablet 5  . metFORMIN (GLUCOPHAGE) 1000 MG tablet Take 1 tablet (1,000 mg total) by mouth 2 (two) times daily with a meal. 180 tablet 3  . Multiple Vitamins-Minerals (CENTRUM SILVER PO) Take 1 tablet by mouth daily. Reported on 09/28/2015    . Omega-3 Fatty Acids (FISH OIL) 300 MG CAPS Take 300 mg by mouth daily.     . predniSONE (DELTASONE) 10 MG tablet Take 4 tablets (40 mg total) by mouth daily with breakfast for 5 days, THEN 3 tablets (30 mg total) daily with breakfast for 5 days, THEN 2 tablets (20 mg total) daily with breakfast for 10 days, THEN 1 tablet (10 mg total) daily with breakfast for 10 days, THEN 0.5 tablets (5 mg total) daily with breakfast for 10 days.  70 tablet 0   No current facility-administered medications for this visit.     Objective:  BP 130/82   Pulse 96   Temp 97.6 F (36.4 C) (Temporal)   Ht 5' 11"  (1.803 m)   Wt 244 lb (110.7 kg)   SpO2 96%   BMI 34.03 kg/m  Gen: NAD, resting comfortably CV: RRR no murmurs rubs or gallops Lungs: CTAB no crackles, wheeze, rhonchi Abdomen: soft/nontender/nondistended/normal bowel sounds.  Ext: trace to 1+ edema- states usually wears compression stockings Skin: warm, dry     Assessment and Plan   #hypertension S: medication:  Amlodipine 5 mg-increased from 2.5 mg last visit Home readings #s: At home readings have only been  hight 1-2 times.  Patient denies any chest pain, shortness of breath, changes or changes in vision. Does not add salt to food. Tries to maintain a heart healthy diet.  BP Readings from Last 3 Encounters:  12/08/19 130/82  12/02/19 140/84  10/26/19 (!) 142/88  A/P: Much improved control today-continue amlodipine 5 mg. Luckily BP ok despite prednisone.   #hyperlipidemia S: Medication: Now on atorvastatin 40 mg once a week up from 20 mg.  No cramping at this point. Lab Results  Component Value Date   CHOL 149 09/17/2019   HDL 46.80 09/17/2019   LDLCALC 91 09/17/2019   LDLDIRECT 65.0 03/17/2018   TRIG 55.0 09/17/2019   CHOLHDL 3 09/17/2019   A/P: Discussed possibly updating direct LDL today-patient ok with this- check direct LDL- may increase atorvastatin to twice a week.     % Diabetes S: Medication: Metformin 1000twice daily. Followed by endocrinology CBGs- At home fasting readings have been between 100-120.  Exercise and diet- Has not been exercising. Is working on [employer].  Lab Results  Component Value Date   HGBA1C 6.1 (A) 10/12/2019   HGBA1C 6.3 (A) 06/15/2019   HGBA1C 5.9 (A) 02/23/2019   A/P: Well-controlled at last visit-discussed with patient to monitor carefully with prednisone as sugars can increase  % Ulcerative Colitis  S:Had flair up about three weeks ago. He has been seen by GI last week and started on prednisone. On day five of treatment. On lialda. A/P: just started prednisone on Friday- hopefuly for improvement wtiht this along with lialda. Looking at other long term options.  - feels like he lost some weight with the flare up  #Proteinuria-we will get a urine microalbumin creatinine ratio.  Last urine specific gravity was high which suggested mild dehydration.  He feels like he is doing a good job staying hydrated-update urine with blood work today  Recommended follow up:  Keep October visit Future Appointments  Date Time Provider [place]   01/22/2020  9:10 AM Gatha Mayer, MD [facility]  02/15/2020  8:00 AM Renato Shin, MD [facility] None  03/14/2020  9:40 AM Yong Channel Brayton Mars, MD [facility]    Lab/Order associations:   ICD-10-CM   1. Hypertension associated with diabetes ([place])  E11.59    I10   2. Hyperlipidemia, unspecified hyperlipidemia type  E78.5   3. Controlled type 2 diabetes mellitus without complication, without long-term current use of insulin (HCC)  E11.9   4. Proteinuria, unspecified type  R80.9    Return precautions advised.  [name], MD

## 2019-12-08 NOTE — Patient Instructions (Addendum)
Health Maintenance Due  Topic Date Due  . PNA vac Low Risk Adult (1 of 2 - PCV13) declines today  Never done   Continue follow up with Dr. Carlean Purl and Dr. Loanne Drilling.   Continue wearing compression stockings for swelling. Continue amlodipine daily and keep log. If you get constantly high numbers let our office know.   We are checking some lab work in addition to a urine sample. We will give you a call with those results. We may make some changes to your cholesterol medications depending on what your lab shows.     Please stop by lab before you go If you have mychart- we will send your results within 3 business days of [place] receiving them.  If you do not have mychart- we will call you about results within 5 business days of [place] receiving them.      Recommended follow up: Return for Keep follow up we have scheduled on 03/14/2020 arrive at 9:30.

## 2020-01-13 ENCOUNTER — Telehealth: Payer: Self-pay | Admitting: Family Medicine

## 2020-01-13 NOTE — Progress Notes (Signed)
  Chronic Care Management   Outreach Note  01/13/2020 Name: DEHAVEN SINE MRN: [id number] DOB: 08-05-1955  Referred by: [name], MD Reason for referral : No chief complaint on file.   An unsuccessful telephone outreach was attempted today. The patient was referred to the pharmacist for assistance with care management and care coordination.   Follow Up Plan:   Earney [place] Upstream Scheduler

## 2020-01-15 ENCOUNTER — Ambulatory Visit: Payer: Medicare Other | Admitting: Family Medicine

## 2020-01-22 ENCOUNTER — Ambulatory Visit: Payer: Medicare Other | Admitting: Internal Medicine

## 2020-01-22 ENCOUNTER — Encounter: Payer: Self-pay | Admitting: Internal Medicine

## 2020-01-22 VITALS — BP 124/82 | HR 81 | Ht 71.0 in | Wt 251.0 lb

## 2020-01-22 DIAGNOSIS — K5150 Left sided colitis without complications: Secondary | ICD-10-CM | POA: Diagnosis not present

## 2020-01-22 NOTE — Patient Instructions (Signed)
Please follow up with Dr Carlean Purl in 6 months or sooner if needed.   Normal BMI (Body Mass Index- based on height and weight) is between 23 and 30. Your BMI today is Body mass index is 35.01 kg/m. [place] Kitchen Please consider follow up  regarding your BMI with your Primary Care Provider.   I appreciate the opportunity to care for you. Silvano Rusk, MD, [facility]

## 2020-01-22 NOTE — Progress Notes (Signed)
Angel Frazier 64 y.o. [birth year] [id number]  Assessment & Plan:  LEFT SIDED ULCERATIVE COLITIS In clinical remission at this time on mesalamine 2.4 g twice daily.  Not inclined to pursue any Biologics right now.  We will continue to observe on mesalamine and return to clinic routine in 6 months.  We did have some discussion about diet and how there is nothing proven to reduce colitis flares but there is active research.  Recommend avoiding processed, packaged foods etc. and eat whole foods without preservatives or added sugars etc.     I appreciate the opportunity to care for this patient. CC: [name], MD   Subjective:   Chief Complaint: Follow-up of ulcerative colitis  HPI Angel Frazier returns for follow-up doing well without diarrhea or abdominal pain problems regarding his left-sided ulcerative colitis.  Recall that he has previously been on mesalamine, had pancreatitis with 6-MP developed psoriasis from Humira and is now on mesalamine therapy and has had to require intermittent steroids.  He is interested in trying to eat differently to reduce colitis flares and has been reading some information from [facility]hn[facility] colitis [facility].  Wt Readings from Last 3 Encounters:  01/22/20 251 lb (113.9 kg)  12/08/19 244 lb (110.7 kg)  12/02/19 240 lb (108.9 kg)    Allergies  Allergen Reactions  . Humira [Adalimumab] Rash    psoriasis  . Mercaptopurine Other (See Comments)    Caused Pancreatitis   Current Meds  Medication Sig  . ACCU-CHEK AVIVA PLUS test strip TEST 4 TIMES A DAY AS DIRECTED BY PRESCIBER  . Accu-Chek Softclix Lancets lancets TEST 4 TIMES DAILY  . amLODipine (NORVASC) 5 MG tablet Take 1 tablet (5 mg total) by mouth at bedtime.  . Ascorbic Acid (VITAMIN C) 1000 MG tablet Take 1,000 mg by mouth daily. Reported on 09/28/2015  . aspirin 81 MG tablet Take 81 mg by mouth daily. Reported on 09/28/2015  . atorvastatin (LIPITOR) 40 MG tablet Take 1 tablet (40 mg total) by  mouth once a week.  . blood glucose meter kit and supplies Dispense based on patient and insurance preference. Use up to four times daily as directed. (FOR ICD-9 250.00, 250.01).  . Calcium Carbonate-Vitamin D (CALCIUM-VITAMIN D3 PO) Take 1 tablet by mouth daily. Reported on 09/28/2015  . Cholecalciferol (VITAMIN D) 50 MCG (2000 UT) CAPS Take 1 capsule by mouth daily.  . Cyanocobalamin (B-12 PO) 5055m daily  . mesalamine (LIALDA) 1.2 g EC tablet Take 2 tablets (2.4 g total) by mouth 2 (two) times daily.  . metFORMIN (GLUCOPHAGE) 1000 MG tablet Take 1 tablet (1,000 mg total) by mouth 2 (two) times daily with a meal.  . Multiple Vitamins-Minerals (CENTRUM SILVER PO) Take 1 tablet by mouth daily. Reported on 09/28/2015  . Omega-3 Fatty Acids (FISH OIL) 300 MG CAPS Take 300 mg by mouth daily.    Past Medical History:  Diagnosis Date  .  vitamin D deficiency 01/24/2013   01/23/13 - level 29 - supplements 50k IU weekly x 6 recommended   . Anabolic steroid abuse    PT. DENIES 03/30/19  . Arthritis   . Avascular necrosis of femoral head (HCC)    bilateral  . Bleeding hemorrhoids   . [place] [address]  type 2  . Drug-induced acute pancreatitis - 6MP 09/07/2015  . Drug-induced pancreatitis    6 MP  . Erectile dysfunction   . Hypertension   . [place]ative [place]   .  Psoriasis caused by Humira 04/13/2019  . Spinal stenosis of lumbar region at multiple levels    Past Surgical History:  Procedure Laterality Date  . COLONOSCOPY     multiple  . KNEE ARTHROSCOPY Right   . SIGMOIDOSCOPY     Social History   Social History Narrative   Single never married. 1 son 1 in 2018. 1 grandson 24 in 2018. 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   Review of Systems As above  Objective:   Physical Exam BP 124/82   Pulse 81   Ht 5' 11"  (1.803 m)   Wt 251 lb (113.9 kg)   BMI 35.01 kg/m  No acute distress  21 minutes total time

## 2020-01-22 NOTE — Assessment & Plan Note (Addendum)
In clinical remission at this time on mesalamine 2.4 g twice daily.  Not inclined to pursue any Biologics right now.  We will continue to observe on mesalamine and return to clinic routine in 6 months.  We did have some discussion about diet and how there is nothing proven to reduce colitis flares but there is active research.  Recommend avoiding processed, packaged foods etc. and eat whole foods without preservatives or added sugars etc.

## 2020-01-25 ENCOUNTER — Telehealth: Payer: Self-pay | Admitting: Family Medicine

## 2020-01-25 NOTE — Progress Notes (Signed)
  Chronic Care Management   Outreach Note  01/25/2020 Name: Angel Frazier MRN: [id number] DOB: Jun 07, 1955  Referred by: [name], MD Reason for referral : No chief complaint on file.   An unsuccessful telephone outreach was attempted today. The patient was referred to the pharmacist for assistance with care management and care coordination.   Follow Up Plan:   Earney Hamburg Upstream Scheduler

## 2020-01-29 ENCOUNTER — Other Ambulatory Visit: Payer: Self-pay | Admitting: Family Medicine

## 2020-01-29 ENCOUNTER — Other Ambulatory Visit: Payer: Self-pay | Admitting: *Deleted

## 2020-01-29 ENCOUNTER — Telehealth: Payer: Self-pay | Admitting: Family Medicine

## 2020-01-29 MED ORDER — ACCU-CHEK SOFTCLIX LANCETS MISC: 0 refills | Status: DC

## 2020-01-29 NOTE — Telephone Encounter (Signed)
.. °  LAST APPOINTMENT DATE: 01/29/2020   NEXT APPOINTMENT DATE:@10 /04/2020  MEDICATION: ACCU-check softclix lancets   PHARMACY:[id number] - [place],  - [address] AT [address]  **Let patient know to contact pharmacy at the end of the day to make sure medication is ready. **  ** Please notify patient to allow 48-72 hours to process**  **Encourage patient to contact the pharmacy for refills or they can request refills through [facility]**  CLINICAL FILLS OUT ALL BELOW:   LAST REFILL:  QTY:  REFILL DATE:    OTHER COMMENTS:    Okay for refill?  Please advise

## 2020-01-29 NOTE — Telephone Encounter (Signed)
Refills send to pharmacy

## 2020-02-03 ENCOUNTER — Telehealth: Payer: Self-pay | Admitting: Family Medicine

## 2020-02-03 NOTE — Progress Notes (Signed)
  Chronic Care Management   Note  02/03/2020 Name: LINCON SAHLIN MRN: [id number] DOB: March 22, 1956  TYRONNE BLANN is a 64 y.o. year old male who is a primary care patient of [name], MD. I reached out to Georgie Chard by phone today in response to a referral sent by Mr. Kelyn Koskela Ambrosini's PCP, [name], MD.   Mr. Woodrick was given information about Chronic Care Management services today including:  1. CCM service includes personalized support from designated clinical staff supervised by his physician, including individualized plan of care and coordination with other care providers 2. 24/7 contact phone numbers for assistance for urgent and routine care needs. 3. Service will only be billed when office clinical staff spend 20 minutes or more in a month to coordinate care. 4. Only one practitioner may furnish and bill the service in a calendar month. 5. The patient may stop CCM services at any time (effective at the end of the month) by phone call to the office staff.   Patient agreed to services and verbal consent obtained.   Follow up plan:   Earney Hamburg Upstream Scheduler

## 2020-02-03 NOTE — Progress Notes (Signed)
  Chronic Care Management   Outreach Note  02/03/2020 Name: FURMAN TRENTMAN MRN: [id number] DOB: Feb 05, 1956  Referred by: [name], MD Reason for referral : No chief complaint on file.   An unsuccessful telephone outreach was attempted today. The patient was referred to the pharmacist for assistance with care management and care coordination.   Follow Up Plan:   Earney Hamburg Upstream Scheduler

## 2020-02-15 ENCOUNTER — Telehealth: Payer: Self-pay | Admitting: Endocrinology

## 2020-02-15 ENCOUNTER — Ambulatory Visit (INDEPENDENT_AMBULATORY_CARE_PROVIDER_SITE_OTHER): Payer: Medicare HMO | Admitting: Endocrinology

## 2020-02-15 ENCOUNTER — Encounter: Payer: Self-pay | Admitting: Endocrinology

## 2020-02-15 ENCOUNTER — Other Ambulatory Visit: Payer: Self-pay

## 2020-02-15 VITALS — BP 142/82 | HR 95 | Ht 71.0 in | Wt 258.6 lb

## 2020-02-15 DIAGNOSIS — E109 Type 1 diabetes mellitus without complications: Secondary | ICD-10-CM

## 2020-02-15 DIAGNOSIS — E119 Type 2 diabetes mellitus without complications: Secondary | ICD-10-CM | POA: Diagnosis not present

## 2020-02-15 LAB — POCT GLYCOSYLATED HEMOGLOBIN (HGB A1C): Hemoglobin A1C: 6.7 % — AB (ref 4.0–5.6)

## 2020-02-15 MED ORDER — REPAGLINIDE 0.5 MG PO TABS: 0.2500 mg | ORAL_TABLET | Freq: Every day | ORAL | 1 refills | Status: DC

## 2020-02-15 MED ORDER — RYBELSUS 3 MG PO TABS
3.0000 mg | ORAL_TABLET | Freq: Every day | ORAL | 11 refills | Status: DC
Start: 2020-02-15 — End: 2020-02-15

## 2020-02-15 NOTE — Patient Instructions (Addendum)
Your blood pressure is high today.  Please see your primary care provider soon, to have it rechecked.   check your blood sugar once a day.  vary the time of day when you check, between before the 3 meals, and at bedtime.  also check if you have symptoms of your blood sugar being too high or too low.  please keep a record of the readings and bring it to your next appointment here (or you can bring the meter itself).  You can write it on any piece of paper.  please call us sooner if your blood sugar goes below 70, or if you have a lot of readings over 200.  I have sent a prescription to your pharmacy, to add "Rybelsus."  If this is too expensive, we'll change to "repaglinide" at supper.   Please continue the same metformin.  Please come back for a follow-up appointment in 4-5 months.

## 2020-02-15 NOTE — Telephone Encounter (Signed)
Patient called stating his insurance does not cover the Rybelsus and asked to please call in Repaglinide instead.   [facility] [phone] - [place], [address] AT [address] Phone:  [phone]  Fax:  [phone]

## 2020-02-15 NOTE — Progress Notes (Signed)
Subjective:    Patient ID: Angel Frazier, male    DOB: [birth year], 64 y.o.   MRN: [id number]  HPI Pt returns for f/u of diabetes mellitus: DM type: 1, in remission (AKA atypical DM, intermittently ketosis-prone DM, etc).   Dx'ed: 8916 Complications: none.   Therapy: metformin.    DKA: once, at dx Severe hypoglycemia: never. Pancreatitis: never Pancreatic imaging: normal on 2017 CT Other: he has been in remission since 2017. He took insulin for a brief time after dx; edema limits rx options.   Interval history: pt says cbg's are well-controlled.  He takes metformin as rx'ed.   Past Medical History:  Diagnosis Date  .  vitamin D deficiency 01/24/2013   01/23/13 - level 29 - supplements 50k IU weekly x 6 recommended   . Anabolic steroid abuse    PT. DENIES 03/30/19  . Arthritis   . Avascular necrosis of femoral head (HCC)    bilateral  . Bleeding hemorrhoids   . Diabetes mellitus without complication ([place]) 9450   type 2  . Drug-induced acute pancreatitis - 6MP 09/07/2015  . Drug-induced pancreatitis    6 MP  . Erectile dysfunction   . Hypertension   . Left sided ulcerative colitis ([place])   . Psoriasis caused by Humira 04/13/2019  . Rhabdomyolysis 12/25/2018  . Spinal stenosis of lumbar region at multiple levels     Past Surgical History:  Procedure Laterality Date  . COLONOSCOPY     multiple  . KNEE ARTHROSCOPY Right   . SIGMOIDOSCOPY      Social History   Socioeconomic History  . Marital status: Single    Spouse name: Not on file  . Number of children: 1  . Years of education: Not on file  . Highest education level: Not on file  Occupational History  . Occupation: retired    Fish farm manager: [employer]  Tobacco Use  . Smoking status: Former Smoker    Packs/day: 1.00    Years: 6.00    Pack years: 6.00    Types: Cigarettes    Quit date: 06/04/2000    Years since quitting: 19.7  . Smokeless tobacco: Never Used  Vaping Use  . Vaping Use: Never used   Substance and Sexual Activity  . Alcohol use: Yes    Comment: occasionally  . Drug use: No  . Sexual activity: Not on file  Other Topics Concern  . Not on file  Social History Narrative   Single never married. 1 son 36 in 2018. 1 grandson 71 in 2018. 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 Difficulty of Paying Living Expenses: Not on file  Food Insecurity:   . Worried About Charity fundraiser in the Last Year: Not on file  . Ran Out of Food in the Last Year: Not on file  Transportation Needs:   . Lack of Transportation (Medical): Not on file  . Lack of Transportation (Non-Medical): Not on file  Physical Activity:   . Days of Exercise per Week: Not on file  . Minutes of Exercise per Session: Not on file  Stress:   . Feeling of Stress : Not on file  Social Connections:   . Frequency of Communication with Friends  and Family: Not on file  . Frequency of Social Gatherings with Friends and Family: Not on file  . Attends Religious Services: Not on file  . Active Member of Clubs or Organizations: Not on file  . Attends Archivist Meetings: Not on file  . Marital Status: Not on file  Intimate Partner Violence:   . Fear of Current or Ex-Partner: Not on file  . Emotionally Abused: Not on file  . Physically Abused: Not on file  . Sexually Abused: Not on file    Current Outpatient Medications on File Prior to Visit  Medication Sig Dispense Refill  . ACCU-CHEK AVIVA PLUS test strip TEST 4 TIMES A DAY AS DIRECTED BY PRESCIBER 350 strip 3  . Accu-Chek Softclix Lancets lancets for testing 300 each 0  . amLODipine (NORVASC) 5 MG tablet Take 1 tablet (5 mg total) by mouth at bedtime. 90 tablet 3  . Ascorbic Acid (VITAMIN C) 1000 MG tablet Take 1,000 mg by mouth daily. Reported on 09/28/2015     . aspirin 81 MG tablet Take 81 mg by mouth daily. Reported on 09/28/2015    . atorvastatin (LIPITOR) 40 MG tablet Take 1 tablet (40 mg total) by mouth once a week. 13 tablet 3  . blood glucose meter kit and supplies Dispense based on patient and insurance preference. Use up to four times daily as directed. (FOR ICD-9 250.00, 250.01). 1 each 0  . Calcium Carbonate-Vitamin D (CALCIUM-VITAMIN D3 PO) Take 1 tablet by mouth daily. Reported on 09/28/2015    . Cholecalciferol (VITAMIN D) 50 MCG (2000 UT) CAPS Take 1 capsule by mouth daily.    . Cyanocobalamin (B-12 PO) 5064m daily    . mesalamine (LIALDA) 1.2 g EC tablet Take 2 tablets (2.4 g total) by mouth 2 (two) times daily. 120 tablet 5  . metFORMIN (GLUCOPHAGE) 1000 MG tablet Take 1 tablet (1,000 mg total) by mouth 2 (two) times daily with a meal. 180 tablet 3  . Multiple Vitamins-Minerals (CENTRUM SILVER PO) Take 1 tablet by mouth daily. Reported on 09/28/2015    . Omega-3 Fatty Acids (FISH OIL) 300 MG CAPS Take 300 mg by mouth daily.      No current facility-administered medications on file prior to visit.    Allergies  Allergen Reactions  . Humira [Adalimumab] Rash    psoriasis  . Mercaptopurine Other (See Comments)    Caused Pancreatitis    Family History  Problem Relation Age of Onset  . Diabetes Mother   . Diabetes Other        cousin  . Hypertension Other   . Diabetes type I Son   . Colon cancer Neg Hx   . Esophageal cancer Neg Hx   . Rectal cancer Neg Hx   . Stomach cancer Neg Hx   . Colon polyps Neg Hx     BP (!) 142/82   Pulse 95   Ht 5' 11" (1.803 m)   Wt 258 lb 9.6 oz (117.3 kg)   SpO2 96%   BMI 36.07 kg/m    Review of Systems Denies n/v/d    Objective:   Physical Exam VITAL SIGNS:  See vs page GENERAL: no distress Pulses: dorsalis pedis intact bilat.   MSK: no deformity of the feet CV: 1+ bilat leg edema Skin:  no ulcer on the feet.  normal color and temp on the feet.  Moderate eczematous rash at  both medial malleoli (sees derm) Neuro: sensation is intact to touch on  the feet  Lab Results  Component Value Date   HGBA1C 6.7 (A) 02/15/2020   Lab Results  Component Value Date   CREATININE 0.76 12/08/2019   BUN 14 12/08/2019   NA 138 12/08/2019   K 3.8 12/08/2019   CL 98 12/08/2019   CO2 29 12/08/2019      Assessment & Plan:  Type 2 DM: worse  Patient Instructions  Your blood pressure is high today.  Please see your primary care provider soon, to have it rechecked.   check your blood sugar once a day.  vary the time of day when you check, between before the 3 meals, and at bedtime.  also check if you have symptoms of your blood sugar being too high or too low.  please keep a record of the readings and bring it to your next appointment here (or you can bring the meter itself).  You can write it on any piece of paper.  please call us sooner if your blood sugar goes below 70, or if you have a lot of readings over 200.  I have sent a prescription to your pharmacy, to add "Rybelsus."  If this is too expensive, we'll change to "repaglinide" at supper.   Please continue the same metformin.  Please come back for a follow-up appointment in 4-5 months.

## 2020-02-15 NOTE — Telephone Encounter (Signed)
Outpatient Medication Detail   Disp Refills Start End   repaglinide (PRANDIN) 0.5 MG tablet 30 tablet 1 02/15/2020    Sig - Route: Take 0.5 tablets (0.25 mg total) by mouth daily with supper. - Oral   Sent to pharmacy as: repaglinide (PRANDIN) 0.5 MG tablet   E-Prescribing Status: Receipt confirmed by pharmacy (02/15/2020  4:46 PM EDT)

## 2020-02-15 NOTE — Telephone Encounter (Signed)
done

## 2020-02-15 NOTE — Telephone Encounter (Signed)
Please review and advise.

## 2020-03-07 ENCOUNTER — Telehealth: Payer: Self-pay | Admitting: Internal Medicine

## 2020-03-07 NOTE — Telephone Encounter (Signed)
I called [employer] at [phone] and asked about re-enrollment for [employer] Lialda. The customer service rep said he is good until December 17th and for me to call mid November about doing this. I called and told Angel Frazier this information.

## 2020-03-07 NOTE — Telephone Encounter (Signed)
Patient called states he needs to be re-certified for the [facility]

## 2020-03-14 ENCOUNTER — Ambulatory Visit: Payer: Medicare Other | Admitting: Family Medicine

## 2020-03-17 NOTE — Progress Notes (Signed)
Phone [phone] In person visit   Subjective:   Angel Frazier is a 64 y.o. year old very pleasant male patient who presents for/with See problem oriented charting Chief Complaint  Patient presents with  . Hypertension    This visit occurred during the SARS-CoV-2 public health emergency.  Safety protocols were in place, including screening questions prior to the visit, additional usage of staff PPE, and extensive cleaning of exam room while observing appropriate contact time as indicated for disinfecting solutions.   Past Medical History-  Patient Active Problem List   Diagnosis Date Noted  . Diabetes mellitus type II, controlled ([place]) 12/22/2018    Priority: High  . LEFT SIDED ULCERATIVE COLITIS 08/11/2008    Priority: High  . Hyperlipidemia associated with type 2 diabetes mellitus ([place]) 01/20/2019    Priority: Medium  . Hypertension associated with diabetes ([place]) 01/20/2019    Priority: Medium  . Pulmonary nodules 09/07/2015    Priority: Medium  . Former smoker 09/17/2019    Priority: Low  . Long term current use of systemic steroids 11/18/2014    Priority: Low  . History of avascular necrosis  11/18/2014    Priority: Low  . Vitamin D deficiency 01/24/2013    Priority: Low  . ED (erectile dysfunction) 05/21/2012    Priority: Low  . Psoriasis caused by Humira 04/13/2019    Medications- reviewed and updated Current Outpatient Medications  Medication Sig Dispense Refill  . ACCU-CHEK AVIVA PLUS test strip TEST 4 TIMES A DAY AS DIRECTED BY PRESCIBER 350 strip 3  . Accu-Chek Softclix Lancets lancets for testing 300 each 0  . amLODipine (NORVASC) 5 MG tablet Take 1 tablet (5 mg total) by mouth at bedtime. 90 tablet 3  . Ascorbic Acid (VITAMIN C) 1000 MG tablet Take 1,000 mg by mouth daily. Reported on 09/28/2015    . aspirin 81 MG tablet Take 81 mg by mouth daily. Reported on 09/28/2015    . atorvastatin (LIPITOR) 40 MG tablet Take 1 tablet (40 mg total) by mouth once a  week. 13 tablet 3  . blood glucose meter kit and supplies Dispense based on patient and insurance preference. Use up to four times daily as directed. (FOR ICD-9 250.00, 250.01). 1 each 0  . Calcium Carbonate-Vitamin D (CALCIUM-VITAMIN D3 PO) Take 1 tablet by mouth daily. Reported on 09/28/2015    . Cholecalciferol (VITAMIN D) 50 MCG (2000 UT) CAPS Take 1 capsule by mouth daily.    . Cyanocobalamin (B-12 PO) 5055m daily    . mesalamine (LIALDA) 1.2 g EC tablet Take 2 tablets (2.4 g total) by mouth 2 (two) times daily. 120 tablet 5  . metFORMIN (GLUCOPHAGE) 1000 MG tablet Take 1 tablet (1,000 mg total) by mouth 2 (two) times daily with a meal. 180 tablet 3  . Multiple Vitamins-Minerals (CENTRUM SILVER PO) Take 1 tablet by mouth daily. Reported on 09/28/2015    . Omega-3 Fatty Acids (FISH OIL) 300 MG CAPS Take 300 mg by mouth daily.     . repaglinide (PRANDIN) 0.5 MG tablet Take 0.5 tablets (0.25 mg total) by mouth daily with supper. 30 tablet 1   No current facility-administered medications for this visit.     Objective:  BP 132/74   Pulse 87   Temp 97.7 F (36.5 C) (Temporal)   Resp 18   Ht _0  (1.803 m)   Wt 256 lb 3.2 oz (116.2 kg)   SpO2 95%   BMI 35.73 kg/m  Gen: NAD, resting  comfortably CV: RRR no murmurs rubs or gallops Lungs: CTAB no crackles, wheeze, rhonchi Ext: no edema Skin: warm, dry    Assessment and Plan   #hypertension S: medication: Amlodipine 5Mg. Wears compression stockings Trying to get plugged in at a new gym as prior gym most people not following protocols for covid BP Readings from Last 3 Encounters:  03/21/20 132/74  02/15/20 (!) 142/82  01/22/20 124/82  A/P: reasonable control- continue current meds  #hyperlipidemia S: Medication:atorvastatin 29m  weekly Lab Results  Component Value Date   CHOL 149 09/17/2019   HDL 46.80 09/17/2019   LDLCALC 91 09/17/2019   LDLDIRECT 64.0 12/08/2019   TRIG 55.0 09/17/2019   CHOLHDL 3 09/17/2019   A/P:  great control with LDL under 70 last check- continue current meds- likely do full lipid panel next visit  %DM follows with endocrinology- wants to lose some weight but a1c was at least under 7 on most recent check. Trended up with weight gain  Recommended follow up: Return in about 4 months (around 07/22/2020) for physical or sooner if needed. Future Appointments  Date Time Provider [place] 05/09/2020  9:00 AM [facility] CCM PHARMACIST [facility] PEC  07/20/2020  9:00 AM ERenato Shin MD [facility] None    Lab/Order associations:   ICD-10-CM   1. Hypertension associated with diabetes ([place]  E11.59    I15.2   2. Hyperlipidemia associated with type 2 diabetes mellitus ([place]  E11.69    E78.5    Return precautions advised.  SGarret Reddish MD

## 2020-03-17 NOTE — Patient Instructions (Addendum)
Health Maintenance Due  Topic Date Due  . Pneumovax 23-  Next visit Never done  . OPHTHALMOLOGY EXAM - Sign release of information at the check out desk for last eye exam for diabetes 12/17/2019  . INFLUENZA VACCINE - high dose flu shot today 01/03/2020   Schedule 4 months physical- this is under a full year from last physical but you are on a new insurance. Please call your insurance to verify they will cover this in february  If you dont hear from Dr. Melvyn Novas by December give them a call as they want to do annual CT scan of lungs (lats one November 2020)

## 2020-03-21 ENCOUNTER — Other Ambulatory Visit: Payer: Self-pay

## 2020-03-21 ENCOUNTER — Ambulatory Visit (INDEPENDENT_AMBULATORY_CARE_PROVIDER_SITE_OTHER): Payer: Medicare HMO | Admitting: Family Medicine

## 2020-03-21 ENCOUNTER — Encounter: Payer: Self-pay | Admitting: Family Medicine

## 2020-03-21 VITALS — BP 132/74 | HR 87 | Temp 97.7°F | Resp 18 | Ht 71.0 in | Wt 256.2 lb

## 2020-03-21 DIAGNOSIS — E785 Hyperlipidemia, unspecified: Secondary | ICD-10-CM | POA: Diagnosis not present

## 2020-03-21 DIAGNOSIS — Z23 Encounter for immunization: Secondary | ICD-10-CM | POA: Diagnosis not present

## 2020-03-21 DIAGNOSIS — E1169 Type 2 diabetes mellitus with other specified complication: Secondary | ICD-10-CM

## 2020-03-21 DIAGNOSIS — E1159 Type 2 diabetes mellitus with other circulatory complications: Secondary | ICD-10-CM | POA: Diagnosis not present

## 2020-03-21 DIAGNOSIS — I152 Hypertension secondary to endocrine disorders: Secondary | ICD-10-CM | POA: Diagnosis not present

## 2020-03-21 MED ORDER — TRUE METRIX METER W/DEVICE KIT: PACK | 1 refills | Status: DC

## 2020-03-21 MED ORDER — METFORMIN HCL 1000 MG PO TABS: 1000.0000 mg | ORAL_TABLET | Freq: Two times a day (BID) | ORAL | 3 refills | Status: DC

## 2020-03-21 MED ORDER — ALCOHOL SWABS PADS: MEDICATED_PAD | 3 refills | Status: DC

## 2020-03-21 MED ORDER — "INSULIN SYRINGE 29G X 1/2"" 0.5 ML MISC": 3 refills | Status: DC

## 2020-03-21 MED ORDER — TRUE METRIX LEVEL 1 LOW VI SOLN: 1 refills | Status: AC

## 2020-03-21 MED ORDER — TRUE METRIX BLOOD GLUCOSE TEST VI STRP: ORAL_STRIP | 12 refills | Status: DC

## 2020-03-22 ENCOUNTER — Other Ambulatory Visit: Payer: Self-pay

## 2020-03-22 MED ORDER — ATORVASTATIN CALCIUM 40 MG PO TABS: 40.0000 mg | ORAL_TABLET | ORAL | 3 refills | Status: DC

## 2020-03-22 MED ORDER — AMLODIPINE BESYLATE 5 MG PO TABS: 5.0000 mg | ORAL_TABLET | Freq: Every day | ORAL | 3 refills | Status: DC

## 2020-03-23 ENCOUNTER — Telehealth: Payer: Self-pay

## 2020-03-23 NOTE — Telephone Encounter (Signed)
I spoke with Angel Frazier and told him we have received the Angel Frazier assistance paperwork and we have started working on it. I will have Dr Carlean Purl sign it tomorrow and Angel Frazier is going to come by tomorrow afternoon to do his part. His current assistance runs out 05/20/2020.

## 2020-03-24 NOTE — Telephone Encounter (Signed)
I called Angel Frazier and told him I'm assigned today so he may come by late today or tomorrow to work on the form.

## 2020-04-01 ENCOUNTER — Telehealth: Payer: Self-pay

## 2020-04-01 MED ORDER — PREDNISONE 10 MG PO TABS: ORAL_TABLET | ORAL | 0 refills | Status: AC

## 2020-04-01 NOTE — Telephone Encounter (Signed)
Patient informed and said thank you.

## 2020-04-01 NOTE — Telephone Encounter (Signed)
Prednisone taper sent in to [facility].

## 2020-04-01 NOTE — Telephone Encounter (Signed)
Rainn to come by today to do his part of the assistance form.

## 2020-04-01 NOTE — Telephone Encounter (Signed)
Mark feels like he is having a flare, 3-5 spells of diarrhea a day, no fever, sometimes blood in stool. He said his insurance won't pay for the [facility] however he is still on the fence about biologics. He wants to make a January appointment to talk about it more. I told him there are assistance programs for these drugs. Today is he asking if you would send him in some prednisone to [facility]. Please advise Sir, thank you.

## 2020-04-04 ENCOUNTER — Other Ambulatory Visit: Payer: Self-pay | Admitting: Endocrinology

## 2020-04-04 NOTE — Telephone Encounter (Signed)
I have faxed the completed the [employer] paperwork to [phone] and I got confirmation that it went thru.

## 2020-04-05 MED ORDER — MESALAMINE 1.2 G PO TBEC: 2.4000 g | DELAYED_RELEASE_TABLET | Freq: Two times a day (BID) | ORAL | 3 refills | Status: DC

## 2020-04-05 NOTE — Telephone Encounter (Signed)
[employer] sent a fax saying they need clarification on Orvis's rx of Lialda. I gave it  verbally to a machine at phone # [phone] which is their pharmacy line. I will also fax them this rx to have to fax # [phone].

## 2020-04-06 NOTE — Telephone Encounter (Signed)
We received a fax from [place] to inform us that Angel Frazier has been approved for his Lialda drug assistance. It is good thru 04/06/2021. I will inform Keefe.

## 2020-04-08 ENCOUNTER — Other Ambulatory Visit: Payer: Self-pay

## 2020-04-08 ENCOUNTER — Telehealth: Payer: Self-pay | Admitting: Endocrinology

## 2020-04-08 MED ORDER — TRUE METRIX BLOOD GLUCOSE TEST VI STRP
ORAL_STRIP | 12 refills | Status: DC
Start: 2020-04-08 — End: 2022-03-14

## 2020-04-08 MED ORDER — TRUE METRIX BLOOD GLUCOSE TEST VI STRP: ORAL_STRIP | 12 refills | Status: DC

## 2020-04-08 NOTE — Telephone Encounter (Signed)
Sent in RX to pharmacy

## 2020-04-08 NOTE — Telephone Encounter (Signed)
Patient called to have an RX for the [facility] sent [place]

## 2020-05-02 ENCOUNTER — Other Ambulatory Visit: Payer: Self-pay | Admitting: Endocrinology

## 2020-05-04 ENCOUNTER — Other Ambulatory Visit: Payer: Self-pay | Admitting: Internal Medicine

## 2020-05-04 ENCOUNTER — Telehealth: Payer: Self-pay

## 2020-05-04 DIAGNOSIS — R918 Other nonspecific abnormal finding of lung field: Secondary | ICD-10-CM

## 2020-05-04 NOTE — Progress Notes (Signed)
  Chronic Care Management Pharmacy Assistant   Name: Angel Frazier  MRN: [id number] DOB: 06/29/1955  Reason for Encounter: Medication Review/ Initial Visit   PCP : Hunter, Stephen O, MD  Allergies:   Allergies  Allergen Reactions  . Humira [Adalimumab] Rash    psoriasis  . Mercaptopurine Other (See Comments)    Caused Pancreatitis    Medications: Outpatient Encounter Medications as of 05/04/2020  Medication Sig Note  . Accu-Chek Softclix Lancets lancets for testing   . Alcohol Swabs (B-D SINGLE USE SWABS REGULAR) PADS USE AS DIRECTED OR NEEDED   . amLODipine (NORVASC) 5 MG tablet Take 1 tablet (5 mg total) by mouth at bedtime.   . Ascorbic Acid (VITAMIN C) 1000 MG tablet Take 1,000 mg by mouth daily. Reported on 09/28/2015   . aspirin 81 MG tablet Take 81 mg by mouth daily. Reported on 09/28/2015   . atorvastatin (LIPITOR) 40 MG tablet Take 1 tablet (40 mg total) by mouth once a week.   . Blood Glucose Calibration (TRUE METRIX LEVEL 1) Low SOLN Use as directed   . blood glucose meter kit and supplies Dispense based on patient and insurance preference. Use up to four times daily as directed. (FOR ICD-9 250.00, 250.01).   . Blood Glucose Monitoring Suppl (TRUE METRIX METER) w/Device KIT Use daily as directed needed   . Calcium Carbonate-Vitamin D (CALCIUM-VITAMIN D3 PO) Take 1 tablet by mouth daily. Reported on 09/28/2015 03/30/2019: TAKING 1200 MG  . Cholecalciferol (VITAMIN D) 50 MCG (2000 UT) CAPS Take 1 capsule by mouth daily.   . Cyanocobalamin (B-12 PO) 5000mg daily   . glucose blood (TRUE METRIX BLOOD GLUCOSE TEST) test strip Use as instructed to check blood sugar before meals.   . INSULIN SYRINGE .5CC/29G 29G X 1/2" 0.5 ML MISC Use as directed needed   . mesalamine (LIALDA) 1.2 g EC tablet Take 2 tablets (2.4 g total) by mouth 2 (two) times daily.   . metFORMIN (GLUCOPHAGE) 1000 MG tablet TAKE 1 TABLET(1000 MG) BY MOUTH TWICE DAILY WITH A MEAL   . Multiple Vitamins-Minerals  (CENTRUM SILVER PO) Take 1 tablet by mouth daily. Reported on 09/28/2015 03/30/2019: MEN 50 PLUS  . Omega-3 Fatty Acids (FISH OIL) 300 MG CAPS Take 300 mg by mouth daily.    . predniSONE (DELTASONE) 10 MG tablet Take 4 tablets (40 mg total) by mouth daily with breakfast for 3 days, THEN 3 tablets (30 mg total) daily with breakfast for 3 days, THEN 2 tablets (20 mg total) daily with breakfast for 7 days, THEN 1.5 tablets (15 mg total) daily with breakfast for 7 days, THEN 1 tablet (10 mg total) daily with breakfast for 7 days, THEN 0.5 tablets (5 mg total) daily with breakfast for 7 days.   . repaglinide (PRANDIN) 0.5 MG tablet Take 0.5 tablets (0.25 mg total) by mouth daily with supper.    No facility-administered encounter medications on file as of 05/04/2020.    Current Diagnosis: Patient Active Problem List   Diagnosis Date Noted  . Former smoker 09/17/2019  . Psoriasis caused by Humira 04/13/2019  . Hyperlipidemia associated with type 2 diabetes mellitus (HCC) 01/20/2019  . Hypertension associated with diabetes (HCC) 01/20/2019  . Diabetes mellitus type II, controlled (HCC) 12/22/2018  . Pulmonary nodules 09/07/2015  . Long term current use of systemic steroids 11/18/2014  . History of avascular necrosis  11/18/2014  . Vitamin D deficiency 01/24/2013  . ED (erectile dysfunction) 05/21/2012  . LEFT SIDED   ULCERATIVE COLITIS 08/11/2008     Have you seen any other providers since your last visit?   Patient stated he saw his  Stomach doctor ( Gastroenterology) Dr. Silvano Rusk.  Any changes in your medications or health?     Patient stated no changes in medications or health.  Any side effects from any medications?     Patient stated no side effects from any medications .  Do you have an symptoms or problems not managed by your medications?      Patient stated he has no symptoms or problems not managed by medications at this time.  Any concerns about your health right now?            Patient stated he has no concerns about his health at this time.  Has your provider asked that you check blood pressure, blood sugar, or follow special diet at home?   Patient states he  Tries to eat good. Patient stated he checks his blood pressure every now and then but checks his blood sugar every morning. Patient states he checks his blood sugars before breakfast and his numbers run around 104 110, 112. Patient stated the highest its ever been has been 120.  Do you get any type of exercise on a regular basis?      Patient states he has been going to the gym . Patient states he lost 1 pound.  Can you think of a goal you would like to reach for your health?      Patient stated he would like to lose 20 pounds .  Do you have any problems getting your medications?       Patient stated he has no problems getting medications.  Is there anything that you would like to discuss during the appointment?          Patient stated not that he can think of at this moment.   Please bring medications and supplements to appointment   Georgiana Shore ,[facility] Clinical Pharmacist Assistant [phone]   Follow-Up:  Pharmacist Review

## 2020-05-05 ENCOUNTER — Other Ambulatory Visit: Payer: Self-pay

## 2020-05-05 MED ORDER — ACCU-CHEK SOFTCLIX LANCETS MISC
0 refills | Status: DC
Start: 2020-05-05 — End: 2020-11-25

## 2020-05-05 MED ORDER — BD SWAB SINGLE USE REGULAR PADS
MEDICATED_PAD | 12 refills | Status: DC
Start: 2020-05-05 — End: 2022-09-27

## 2020-05-09 ENCOUNTER — Other Ambulatory Visit: Payer: Self-pay

## 2020-05-09 ENCOUNTER — Ambulatory Visit: Payer: Medicare HMO

## 2020-05-09 DIAGNOSIS — E785 Hyperlipidemia, unspecified: Secondary | ICD-10-CM

## 2020-05-09 DIAGNOSIS — E119 Type 2 diabetes mellitus without complications: Secondary | ICD-10-CM

## 2020-05-09 DIAGNOSIS — E1169 Type 2 diabetes mellitus with other specified complication: Secondary | ICD-10-CM

## 2020-05-09 DIAGNOSIS — I152 Hypertension secondary to endocrine disorders: Secondary | ICD-10-CM

## 2020-05-09 NOTE — Patient Instructions (Addendum)
[name],  It was great meeting you today! I appreciate you taking the time out of your Monday morning to talk with me about your medications. You should expect to receive the [employer] patient assistance application in the mail within 2 weeks - if you have not, please call me at the number below.  Please sign/complete the patient section of the [employer] patient assistance application and provide any required financial information requested. You can drop these off with Dr Loanne Drilling for sign off or bring to [place] for me to coordinate final steps.  You can reach my team at [phone].   Thank you! Ellin Mayhew, Pharm.D., BCGP Clinical Pharmacist [facility] [phone]   Goals Addressed            This Visit's Progress   . PharmD Care Plan       CARE PLAN ENTRY (see longitudinal plan of care for additional care plan information)  Current Barriers:  . Chronic Disease Management support, education, and care coordination needs related to Hypertension, Hyperlipidemia, and Diabetes   Hypertension BP Readings from Last 3 Encounters:  03/21/20 132/74  02/15/20 (!) 142/82  01/22/20 124/82   . Pharmacist Clinical Goal(s): o Over the next 365 days, patient will work with PharmD and providers to maintain BP goal <130/80 . Current regimen:  o Amlodipine 5 mg once daily  . Interventions: o Reviewed home monitoring recommendations o Reviewed diet and exercise - Maintain a healthy weight and exercise regularly, as directed by your health care provider. Eat healthy foods, such as: Lean proteins, complex carbohydrates, fresh fruits and vegetables, low-fat dairy products, healthy fats. . Patient self care activities - Over the next 365 days, patient will: o Check BP at least once every 1-2 weeks, document, and provide at future appointments o Ensure daily salt intake < 2300 mg/day  Hyperlipidemia Lab Results  Component Value Date/Time    LDLCALC 91 09/17/2019 11:28 AM   LDLDIRECT 64.0 12/08/2019 01:24 PM   . Pharmacist Clinical Goal(s): o Over the next 365 days, patient will work with PharmD and providers to maintain LDL goal < 70 . Current regimen:  o Atorvastatin 40 mg once WEEKLY . Interventions: o Reviewed side effects/tolerability - none noted at present. . Patient self care activities - Over the next 365 days, patient will: o Continue current management  Diabetes Lab Results  Component Value Date/Time   HGBA1C 6.7 (A) 02/15/2020 08:09 AM   HGBA1C 6.1 (A) 10/12/2019 09:32 AM   HGBA1C 6.7 (H) 12/04/2018 10:09 AM   HGBA1C 6.3 09/11/2017 08:26 AM   . Pharmacist Clinical Goal(s): o Over the next 365 days, patient will work with PharmD and providers to maintain A1c goal <7% . Current regimen:  . Metformin 1000 mg twice daily with meals . Prandin 0.5 mg tablet - take half (0.25 mg) tablet by mouth once daily with supper . Interventions: o Reviewed plate method for DM diet. o Reviewed PAP for [employer] - appears would qualify, agreed to pursue application. . Patient self care activities - Over the next 365 days, patient will: o Check blood sugar once daily, document, and provide at future appointments o Contact provider with any episodes of hypoglycemia  Medication management . Pharmacist Clinical Goal(s): o Over the next 365 days, patient will work with PharmD and providers to maintain optimal medication adherence . [facility] . Interventions o Comprehensive medication review performed. o  Continue current medication management strategy. . Patient self care activities - Over the next 365 days, patient will: o Take medications as prescribed o Report any questions or concerns to PharmD and/or provider(s)  Initial goal documentation.      The patient verbalized understanding of instructions provided today and agreed to receive a mailed copy of patient  instruction and/or educational materials. Telephone follow up appointment with pharmacy team member scheduled for: See next appointment with "Care Management Staff" under "What's Next" below.   [name], Pharm.D., BCGP Clinical Pharmacist [facility] [phone]  Diabetes Mellitus and Nutrition, Adult When you have diabetes (diabetes mellitus), it is very important to have healthy eating habits because your blood sugar (glucose) levels are greatly affected by what you eat and drink. Eating healthy foods in the appropriate amounts, at about the same times every day, can help you:  Control your blood glucose.  Lower your risk of heart disease.  Improve your blood pressure.  Reach or maintain a healthy weight. Every person with diabetes is different, and each person has different needs for a meal plan. Your health care provider may recommend that you work with a diet and nutrition specialist (dietitian) to make a meal plan that is best for you. Your meal plan may vary depending on factors such as:  The calories you need.  The medicines you take.  Your weight.  Your blood glucose, blood pressure, and cholesterol levels.  Your activity level.  Other health conditions you have, such as heart or kidney disease. How do carbohydrates affect me? Carbohydrates, also called carbs, affect your blood glucose level more than any other type of food. Eating carbs naturally raises the amount of glucose in your blood. Carb counting is a method for keeping track of how many carbs you eat. Counting carbs is important to keep your blood glucose at a healthy level, especially if you use insulin or take certain oral diabetes medicines. It is important to know how many carbs you can safely have in each meal. This is different for every person. Your dietitian can help you calculate how many carbs you should have at each meal and for each snack. Foods that contain carbs include:  Bread,  cereal, rice, pasta, and crackers.  Potatoes and corn.  Peas, beans, and lentils.  Milk and yogurt.  Fruit and juice.  Desserts, such as cakes, cookies, ice cream, and candy. How does alcohol affect me? Alcohol can cause a sudden decrease in blood glucose (hypoglycemia), especially if you use insulin or take certain oral diabetes medicines. Hypoglycemia can be a life-threatening condition. Symptoms of hypoglycemia (sleepiness, dizziness, and confusion) are similar to symptoms of having too much alcohol. If your health care provider says that alcohol is safe for you, follow these guidelines:  Limit alcohol intake to no more than 1 drink per day for nonpregnant women and 2 drinks per day for men. One drink equals 12 oz of beer, 5 oz of wine, or 1 oz of hard liquor.  Do not drink on an empty stomach.  Keep yourself hydrated with water, diet soda, or unsweetened iced tea.  Keep in mind that regular soda, juice, and other mixers may contain a lot of sugar and must be counted as carbs. What are tips for following this plan?  Reading food labels  Start by checking the serving size on the "Nutrition Facts" label of packaged foods and drinks. The amount of calories, carbs, fats, and other nutrients listed on the  label is based on one serving of the item. Many items contain more than one serving per package.  Check the total grams (g) of carbs in one serving. You can calculate the number of servings of carbs in one serving by dividing the total carbs by 15. For example, if a food has 30 g of total carbs, it would be equal to 2 servings of carbs.  Check the number of grams (g) of saturated and trans fats in one serving. Choose foods that have low or no amount of these fats.  Check the number of milligrams (mg) of salt (sodium) in one serving. Most people should limit total sodium intake to less than 2,300 mg per day.  Always check the nutrition information of foods labeled as "low-fat" or  "nonfat". These foods may be higher in added sugar or refined carbs and should be avoided.  Talk to your dietitian to identify your daily goals for nutrients listed on the label. Shopping  Avoid buying canned, premade, or processed foods. These foods tend to be high in fat, sodium, and added sugar.  Shop around the outside edge of the grocery store. This includes fresh fruits and vegetables, bulk grains, fresh meats, and fresh dairy. Cooking  Use low-heat cooking methods, such as baking, instead of high-heat cooking methods like deep frying.  Cook using healthy oils, such as olive, canola, or sunflower oil.  Avoid cooking with butter, cream, or high-fat meats. Meal planning  Eat meals and snacks regularly, preferably at the same times every day. Avoid going long periods of time without eating.  Eat foods high in fiber, such as fresh fruits, vegetables, beans, and whole grains. Talk to your dietitian about how many servings of carbs you can eat at each meal.  Eat 4-6 ounces (oz) of lean protein each day, such as lean meat, chicken, fish, eggs, or tofu. One oz of lean protein is equal to: ? 1 oz of meat, chicken, or fish. ? 1 egg. ?  cup of tofu.  Eat some foods each day that contain healthy fats, such as avocado, nuts, seeds, and fish. Lifestyle  Check your blood glucose regularly.  Exercise regularly as told by your health care provider. This may include: ? 150 minutes of moderate-intensity or vigorous-intensity exercise each week. This could be brisk walking, biking, or water aerobics. ? Stretching and doing strength exercises, such as yoga or weightlifting, at least 2 times a week.  Take medicines as told by your health care provider.  Do not use any products that contain nicotine or tobacco, such as cigarettes and e-cigarettes. If you need help quitting, ask your health care provider.  Work with a Social worker or diabetes educator to identify strategies to manage stress and  any emotional and social challenges. Questions to ask a health care provider  Do I need to meet with a diabetes educator?  Do I need to meet with a dietitian?  What number can I call if I have questions?  When are the best times to check my blood glucose? Where to find more information:  American Diabetes Association: diabetes.org  Academy of Nutrition and Dietetics: www.eatright.[employer] of Diabetes and Digestive and Kidney Diseases (NIH): [employer] Summary  A healthy meal plan will help you control your blood glucose and maintain a healthy lifestyle.  Working with a diet and nutrition specialist (dietitian) can help you make a meal plan that is best for you.  Keep in mind that carbohydrates (carbs) and alcohol have  immediate effects on your blood glucose levels. It is important to count carbs and to use alcohol carefully. This information is not intended to replace advice given to you by your health care provider. Make sure you discuss any questions you have with your health care provider. Document Revised: 05/03/2017 Document Reviewed: 06/25/2016 Elsevier Patient Education  2020 [employer].   Exercising to Lose Weight Exercise is structured, repetitive physical activity to improve fitness and health. Getting regular exercise is important for everyone. It is especially important if you are overweight. Being overweight increases your risk of heart disease, stroke, diabetes, high blood pressure, and several types of cancer. Reducing your calorie intake and exercising can help you lose weight. Exercise is usually categorized as moderate or vigorous intensity. To lose weight, most people need to do a certain amount of moderate-intensity or vigorous-intensity exercise each week. Moderate-intensity exercise  Moderate-intensity exercise is any activity that gets you moving enough to burn at least three times more energy (calories) than if you were  sitting. Examples of moderate exercise include:  Walking a mile in 15 minutes.  Doing light yard work.  Biking at an easy pace. Most people should get at least 150 minutes (2 hours and 30 minutes) a week of moderate-intensity exercise to maintain their body weight. Vigorous-intensity exercise Vigorous-intensity exercise is any activity that gets you moving enough to burn at least six times more calories than if you were sitting. When you exercise at this intensity, you should be working hard enough that you are not able to carry on a conversation. Examples of vigorous exercise include:  Running.  Playing a team sport, such as football, basketball, and soccer.  Jumping rope. Most people should get at least 75 minutes (1 hour and 15 minutes) a week of vigorous-intensity exercise to maintain their body weight. How can exercise affect me? When you exercise enough to burn more calories than you eat, you lose weight. Exercise also reduces body fat and builds muscle. The more muscle you have, the more calories you burn. Exercise also:  Improves mood.  Reduces stress and tension.  Improves your overall fitness, flexibility, and endurance.  Increases bone strength. The amount of exercise you need to lose weight depends on:  Your age.  The type of exercise.  Any health conditions you have.  Your overall physical ability. Talk to your health care provider about how much exercise you need and what types of activities are safe for you. What actions can I take to lose weight? Nutrition   Make changes to your diet as told by your health care provider or diet and nutrition specialist (dietitian). This may include: ? Eating fewer calories. ? Eating more protein. ? Eating less unhealthy fats. ? Eating a diet that includes fresh fruits and vegetables, whole grains, low-fat dairy products, and lean protein. ? Avoiding foods with added fat, salt, and sugar.  Drink plenty of water while  you exercise to prevent dehydration or heat stroke. Activity  Choose an activity that you enjoy and set realistic goals. Your health care provider can help you make an exercise plan that works for you.  Exercise at a moderate or vigorous intensity most days of the week. ? The intensity of exercise may vary from person to person. You can tell how intense a workout is for you by paying attention to your breathing and heartbeat. Most people will notice their breathing and heartbeat get faster with more intense exercise.  Do resistance training twice each week,  such as: ? Push-ups. ? Sit-ups. ? Lifting weights. ? Using resistance bands.  Getting short amounts of exercise can be just as helpful as long structured periods of exercise. If you have trouble finding time to exercise, try to include exercise in your daily routine. ? Get up, stretch, and walk around every 30 minutes throughout the day. ? Go for a walk during your lunch break. ? Park your car farther away from your destination. ? If you take public transportation, get off one stop early and walk the rest of the way. ? Make phone calls while standing up and walking around. ? Take the stairs instead of elevators or escalators.  Wear comfortable clothes and shoes with good support.  Do not exercise so much that you hurt yourself, feel dizzy, or get very short of breath. Where to find more information  U.S. Department of Health and Human Services: [employer].at  Centers for Disease Control and Prevention (CDC): http://www.wolf.info/ Contact a health care provider:  Before starting a new exercise program.  If you have questions or concerns about your weight.  If you have a medical problem that keeps you from exercising. Get help right away if you have any of the following while exercising:  Injury.  Dizziness.  Difficulty breathing or shortness of breath that does not go away when you stop exercising.  Chest pain.  Rapid  heartbeat. Summary  Being overweight increases your risk of heart disease, stroke, diabetes, high blood pressure, and several types of cancer.  Losing weight happens when you burn more calories than you eat.  Reducing the amount of calories you eat in addition to getting regular moderate or vigorous exercise each week helps you lose weight. This information is not intended to replace advice given to you by your health care provider. Make sure you discuss any questions you have with your health care provider. Document Revised: 06/03/2017 Document Reviewed: 06/03/2017 Elsevier Patient Education  2020 [employer].

## 2020-05-09 NOTE — Progress Notes (Signed)
[facility]  Name: Angel Frazier  MRN: [id number]   DOB: [birth year]  Chief Complaint/ HPI Angel Frazier, 64 y.o., male, presents for their initial CCM visit with the clinical pharmacist in office .  PCP : [name], MD Encounter Diagnoses  Name Primary?  . Hypertension associated with diabetes ([place]) Yes  . Controlled type 2 diabetes mellitus without complication, without long-term current use of insulin ([place])   . Hyperlipidemia associated with type 2 diabetes mellitus ([place])     Office Visits:  03/21/2020 (PCP): trying for new gym, cautious due to others not following covid protocols. Physical to be scheduled for 07/2019, will need lung scan/f.u w/ dr wert.  Consult Visit: 02/15/2020 (Dr Loanne Drilling): sending Rx for Rybelsus to pharmacy - if too expensive will change repaglinide to at supper, continue same metformin and f/u 4-5 months. BP somewhat high during visit - advised to follow-up with PCP.    Patient Active Problem List   Diagnosis Date Noted  . Former smoker 09/17/2019  . Psoriasis caused by Humira 04/13/2019  . Hyperlipidemia associated with type 2 diabetes mellitus ([place]) 01/20/2019  . Hypertension associated with diabetes ([place]) 01/20/2019  . Diabetes mellitus type II, controlled ([place]) 12/22/2018  . Pulmonary nodules 09/07/2015  . Long term current use of systemic steroids 11/18/2014  . History of avascular necrosis  11/18/2014  . Vitamin D deficiency 01/24/2013  . ED (erectile dysfunction) 05/21/2012  . LEFT SIDED ULCERATIVE COLITIS 08/11/2008   Past Surgical History:  Procedure Laterality Date  . COLONOSCOPY     multiple  . KNEE ARTHROSCOPY Right   . SIGMOIDOSCOPY     Family History  Problem Relation Age of Onset  . Diabetes Mother   . Breast cancer Sister   . Diabetes Other        cousin  . Hypertension Other   . Diabetes type I Son   . Colon cancer Neg Hx   . Esophageal cancer Neg Hx   . Rectal cancer Neg Hx   . Stomach cancer Neg  Hx   . Colon polyps Neg Hx    Allergies  Allergen Reactions  . Humira [Adalimumab] Rash    psoriasis  . Mercaptopurine Other (See Comments)    Caused Pancreatitis   Outpatient Encounter Medications as of 05/09/2020  Medication Sig Note  . Accu-Chek Softclix Lancets lancets Use to check sugar before meals 3 times daily   . Alcohol Swabs (B-D SINGLE USE SWABS REGULAR) PADS USE AS DIRECTED OR NEEDED   . amLODipine (NORVASC) 5 MG tablet Take 1 tablet (5 mg total) by mouth at bedtime.   . Ascorbic Acid (VITAMIN C) 1000 MG tablet Take 1,000 mg by mouth daily. Reported on 09/28/2015   . aspirin 81 MG tablet Take 81 mg by mouth daily. Reported on 09/28/2015   . atorvastatin (LIPITOR) 40 MG tablet Take 1 tablet (40 mg total) by mouth once a week.   . Blood Glucose Calibration (TRUE METRIX LEVEL 1) Low SOLN Use as directed   . blood glucose meter kit and supplies Dispense based on patient and insurance preference. Use up to four times daily as directed. (FOR ICD-9 250.00, 250.01).   . Blood Glucose Monitoring Suppl (TRUE METRIX METER) w/Device KIT Use daily as directed needed   . Calcium Carbonate-Vitamin D (CALCIUM-VITAMIN D3 PO) Take 1 tablet by mouth daily. Reported on 09/28/2015 03/30/2019: TAKING 1200 MG  . Cholecalciferol (VITAMIN D) 50 MCG (2000 UT) CAPS Take 1 capsule by  mouth daily.   . Cyanocobalamin (B-12 PO) 5020m daily   . glucose blood (TRUE METRIX BLOOD GLUCOSE TEST) test strip Use as instructed to check blood sugar before meals.   . INSULIN SYRINGE .5CC/29G 29G X 1/2" 0.5 ML MISC Use as directed needed   . mesalamine (LIALDA) 1.2 g EC tablet Take 2 tablets (2.4 g total) by mouth 2 (two) times daily.   . metFORMIN (GLUCOPHAGE) 1000 MG tablet TAKE 1 TABLET(1000 MG) BY MOUTH TWICE DAILY WITH A MEAL   . Multiple Vitamins-Minerals (CENTRUM SILVER PO) Take 1 tablet by mouth daily. Reported on 09/28/2015 03/30/2019: MEN 50 PLUS  . Omega-3 Fatty Acids (FISH OIL) 300 MG CAPS Take 300 mg by  mouth daily.    . repaglinide (PRANDIN) 0.5 MG tablet Take 0.5 tablets (0.25 mg total) by mouth daily with supper.    No facility-administered encounter medications on file as of 05/09/2020.   Patient Care Team    Relationship Specialty Notifications Start End  HMarin Olp MD PCP - General Family Medicine  03/30/16   ERenato Shin MD Consulting Physician Endocrinology  12/04/18   WTanda Rockers MD Consulting Physician Pulmonary Disease  12/04/18   GGatha Mayer MD Consulting Physician Gastroenterology  12/04/18   PMadelin Rear RPeacehealth Peace Island Medical CenterPharmacist Pharmacist  02/03/20    Comment: [phone]  Current Diagnosis/Assessment: Goals Addressed            This Visit's Progress   . PharmD Care Plan       CARE PLAN ENTRY (see longitudinal plan of care for additional care plan information)  Current Barriers:  . Chronic Disease Management support, education, and care coordination needs related to Hypertension, Hyperlipidemia, and Diabetes   Hypertension BP Readings from Last 3 Encounters:  03/21/20 132/74  02/15/20 (!) 142/82  01/22/20 124/82   . Pharmacist Clinical Goal(s): o Over the next 365 days, patient will work with PharmD and providers to maintain BP goal <130/80 . Current regimen:  o Amlodipine 5 mg once daily  . Interventions: o Reviewed home monitoring recommendations o Reviewed diet and exercise - Maintain a healthy weight and exercise regularly, as directed by your health care provider. Eat healthy foods, such as: Lean proteins, complex carbohydrates, fresh fruits and vegetables, low-fat dairy products, healthy fats. . Patient self care activities - Over the next 365 days, patient will: o Check BP at least once every 1-2 weeks, document, and provide at future appointments o Ensure daily salt intake < 2300 mg/day  Hyperlipidemia Lab Results  Component Value Date/Time   LDLCALC 91 09/17/2019 11:28 AM   LDLDIRECT 64.0 12/08/2019 01:24 PM   . Pharmacist Clinical  Goal(s): o Over the next 365 days, patient will work with PharmD and providers to maintain LDL goal < 70 . Current regimen:  o Atorvastatin 40 mg once WEEKLY . Interventions: o Reviewed side effects/tolerability - none noted at present. . Patient self care activities - Over the next 365 days, patient will: o Continue current management  Diabetes Lab Results  Component Value Date/Time   HGBA1C 6.7 (A) 02/15/2020 08:09 AM   HGBA1C 6.1 (A) 10/12/2019 09:32 AM   HGBA1C 6.7 (H) 12/04/2018 10:09 AM   HGBA1C 6.3 09/11/2017 08:26 AM   . Pharmacist Clinical Goal(s): o Over the next 365 days, patient will work with PharmD and providers to maintain A1c goal <7% . Current regimen:  . Metformin 1000 mg twice daily with meals . Prandin 0.5 mg tablet - take half (0.25 mg)  tablet by mouth once daily with supper . Interventions: o Reviewed plate method for DM diet. o Reviewed PAP for Rybelsus - appears would qualify, agreed to pursue application. . Patient self care activities - Over the next 365 days, patient will: o Check blood sugar once daily, document, and provide at future appointments o Contact provider with any episodes of hypoglycemia  Medication management . Pharmacist Clinical Goal(s): o Over the next 365 days, patient will work with PharmD and providers to maintain optimal medication adherence . [facility] . Interventions o Comprehensive medication review performed. o Continue current medication management strategy. . Patient self care activities - Over the next 365 days, patient will: o Take medications as prescribed o Report any questions or concerns to PharmD and/or provider(s)  Initial goal documentation.      Hypertension   BP goal <130/80  BP Readings from Last 3 Encounters:  03/21/20 132/74  02/15/20 (!) 142/82  01/22/20 124/82   Previous medications: n/a. Patient checks BP at home several times per month. Goes to  gym daily. Rice broccoli, chicken, fish.  Patient is currently at goal on the following medications:  . Amlodipine 5 mg once daily  Reviewed home monitoring recommendations Reviewed diet and exercise - Maintain a healthy weight and exercise regularly, as directed by your health care provider. Eat healthy foods, such as: Lean proteins, complex carbohydrates, fresh fruits and vegetables, low-fat dairy products, healthy fats.  Plan  Continue current medications.  Diabetes   A1c goal < 7%  Lab Results  Component Value Date/Time   HGBA1C 6.7 (A) 02/15/2020 08:09 AM   HGBA1C 6.1 (A) 10/12/2019 09:32 AM   HGBA1C 6.7 (H) 12/04/2018 10:09 AM   HGBA1C 6.3 09/11/2017 08:26 AM   MICROALBUR 4.9 (H) 12/08/2019 01:24 PM   MICROALBUR 89.5 04/15/2019 12:00 AM   MICROALBUR 30-300 08/30/2016 09:58 AM    Checking BG: Daily. Recent FBG readings: 104-120 Follows with Dr. Loanne Drilling. Patient is currently at goal on the following medications:  Marland Kitchen Metformin 1000 mg twice daily with meals . Prandin 0.5 mg tablet - take half (0.25 mg) tablet with supper once daily  Reviewed plate method for DM diet. Reviewed PAP for Rybelsus - appears would qualify, agreed to pursue application  Plan  Continue current medications. Rybelsus patient assistance application  Hyperlipidemia   LDL goal < 70  Lipid Panel     Component Value Date/Time   CHOL 149 09/17/2019 1128   TRIG 55.0 09/17/2019 1128   HDL 46.80 09/17/2019 1128   LDLCALC 91 09/17/2019 1128   LDLDIRECT 64.0 12/08/2019 1324    Hepatic Function Latest Ref Rng & Units 12/08/2019 09/17/2019 02/16/2019  Total Protein 6.0 - 8.3 g/dL 7.1 7.1 7.0  Albumin 3.5 - 5.2 g/dL 4.1 4.3 4.2  AST 0 - 37 U/L 17 43(H) 21  ALT 0 - 53 U/L 21 38 21  Alk Phosphatase 39 - 117 U/L 38(L) 42 38(L)  Total Bilirubin 0.2 - 1.2 mg/dL 0.6 0.9 0.6  Bilirubin, Direct 0.0 - 0.3 mg/dL - - -    The 10-year ASCVD risk score Mikey Bussing DC Jr., et al., 2013) is: 29.1%   Values used to  calculate the score:     Age: 27 years     Sex: Male     Is Non-Hispanic African American: Yes     Diabetic: Yes     Tobacco smoker: No     Systolic Blood Pressure: 027 mmHg  Is BP treated: Yes     HDL Cholesterol: 46.8 mg/dL     Total Cholesterol: 149 mg/dL   No side effects reported. Patient is currently at goal the following medications:  . Atorvastatin 40 mg once WEEKLY  Reviewed side effects - none noted.  Plan  Continue current medications.  UC   Patient is currently controlled on the following medications:  Marland Kitchen Mesalamine 1.2 g EC tablet - 2 tablets by mouth 2 times daily   We discussed:  Patient assistance - already in [place] pt assistance  Plan  Continue current medications.  Vaccines   Immunization History  Administered Date(s) Administered  . Fluad Quad(high Dose 65+) 03/21/2020  . Influenza Split 02/15/2011  . Influenza, Seasonal, Injecte, Preservative Fre 05/21/2012  . Influenza,inj,Quad PF,6+ Mos 06/03/2013  . Moderna SARS-COVID-2 Vaccination 08/24/2019, 09/21/2019, 04/04/2020  . Td 06/04/2001  . Tdap 05/21/2012   Reviewed and discussed patient's vaccination history.    Plan  Recommended patient receive pneumonia vaccine in office, shingrix in pharmacy.   Medication Management / Care Coordination   Receives prescription medications from:  [id number] - [address], [address] [place] [address] Phone: [phone] Fax: [phone]  [facility] - [place], [place] [address] Phone: [phone] Fax: [phone]   Pt assistance: [place] through [place].   Plan  Continue current medication management strategy. ___________________________ SDOH (Social Determinants of Health) assessments performed: Yes.  Future Appointments  Date Time Provider [place]  05/19/2020  9:00 AM  [facility]-CT 1 [facility]-CT LB-[facility]  07/20/2020  9:00 AM Renato Shin, MD [facility]ENDO None  08/09/2020  8:00 AM [name], MD [facility] P[facility]  05/03/2021  1:30 PM [facility] CCM PHARMACIST [facility] PEC   Visit follow-up:  . CPA follow-up: 6 month DM call. Marland Kitchen RPH follow-up: 10-12 month f/u telephone visit.  [name], Pharm.D., BCGP Clinical Pharmacist [facility] [phone]

## 2020-05-16 ENCOUNTER — Telehealth: Payer: Self-pay | Admitting: Family Medicine

## 2020-05-16 NOTE — Telephone Encounter (Signed)
Left message for patient to call back and schedule Medicare Annual Wellness Visit (AWV) either virtually OR in office.   Last AWV 04/16/19; please schedule at anytime with [facility]Nurse Health Advisor at [facility].  This should be a 45 minute visit.

## 2020-05-19 ENCOUNTER — Telehealth: Payer: Self-pay | Admitting: Internal Medicine

## 2020-05-19 ENCOUNTER — Other Ambulatory Visit: Payer: Self-pay

## 2020-05-19 ENCOUNTER — Ambulatory Visit (INDEPENDENT_AMBULATORY_CARE_PROVIDER_SITE_OTHER)
Admission: RE | Admit: 2020-05-19 | Discharge: 2020-05-19 | Disposition: A | Discharge status: Home / Self Care | Payer: Medicare HMO | Source: Ambulatory Visit | Attending: Internal Medicine | Admitting: Internal Medicine

## 2020-05-19 DIAGNOSIS — R918 Other nonspecific abnormal finding of lung field: Secondary | ICD-10-CM | POA: Diagnosis not present

## 2020-05-19 DIAGNOSIS — I2510 Atherosclerotic heart disease of native coronary artery without angina pectoris: Secondary | ICD-10-CM | POA: Diagnosis not present

## 2020-05-19 DIAGNOSIS — R911 Solitary pulmonary nodule: Secondary | ICD-10-CM | POA: Diagnosis not present

## 2020-05-19 NOTE — Telephone Encounter (Signed)
Aware, see result note 

## 2020-05-19 NOTE — Telephone Encounter (Signed)
[facility] calling in a call report for pt's CT chest obtained today.  Impression copied below, full report available in Epic:  IMPRESSION: 1. Interval increase in size of a rounded subpleural pulmonary nodule in the superior segment right lower lobe, measuring 1.0 x 1.0 cm, previously 0.8 x 0.8 cm, concerning for malignancy. 2. Significant interval increase in size of a ground-glass pulmonary nodule of the left upper lobe measuring 2.0 x 1.1 cm, previously no greater than 0.9 x 0.7 cm. There is an additional ground-glass nodule of the lateral segment right middle lobe, which is almost completely new compared to prior examination measuring 2.5 x 2.3 cm. There is an additional mixed solid and ground-glass nodule of the medial segment right middle lobe measuring 1.1 x 1.1 cm, unchanged. Findings are concerning for multifocal adenocarcinoma. 3. Recommend multi disciplinary thoracic referral for further evaluation. FDG PET may be helpful to characterize metabolic activity of the largest solid nodule. 4. Background of additional stable, benign small pulmonary nodules. 5. Coronary artery disease.

## 2020-05-19 NOTE — Telephone Encounter (Signed)
Copied CT chest recs from MW below:   Call patient : study suggests some of the nodules are larger and may need a bx but rec PET first then we need to set up televisit to go over results   ------------  Spoke with pt, scheduled for televisit tomorrow (12/17) at 0900 to review CT chest.  We can order PET after discussing results with pt tomorrow.  Nothing further needed at this time- will close encounter.

## 2020-05-19 NOTE — Telephone Encounter (Signed)
done

## 2020-05-19 NOTE — Progress Notes (Signed)
LMTCB

## 2020-05-19 NOTE — Telephone Encounter (Signed)
Dr Melvyn Novas, please advise on result note.

## 2020-05-20 ENCOUNTER — Encounter: Payer: Self-pay | Admitting: Internal Medicine

## 2020-05-20 ENCOUNTER — Ambulatory Visit (INDEPENDENT_AMBULATORY_CARE_PROVIDER_SITE_OTHER): Payer: Medicare HMO | Admitting: Internal Medicine

## 2020-05-20 DIAGNOSIS — R918 Other nonspecific abnormal finding of lung field: Secondary | ICD-10-CM

## 2020-05-20 NOTE — Patient Instructions (Signed)
We will call to shedule PET and I will call with results and plan the next step.

## 2020-05-20 NOTE — Assessment & Plan Note (Signed)
1st detected 2017 on abd ct CT 09/25/15 Multiple bilateral pulmonary nodules, measuring up to 9 mm  - CT 04/24/16 1. Stable numerous (at least 12) solid pulmonary nodules scattered throughout both lungs measuring up to 9 mm in the right lower lobe, for which 6 month stability has been demonstrated, suggesting a benign etiology.  2. Stable apical right upper lobe 1.1 cm ground-glass pulmonary nodule. Stable medial right middle lobe ground-glass and possibly cavitary 1.0 cm pulmonary nodule. Given persistence, repeat CT is recommended every 2 years until 5 years of stability   - CT chest 04/29/2017 1. Scattered solid pulmonary nodules measure 8 mm or less in size, are unchanged from 09/21/2015 and are considered benign. 2. 10 mm ground-glass nodule in the medial segment right middle lobe, stable. Follow-up in 2 years is recommended, as clinically indicated, as a low-grade adenocarcinoma cannot be excluded   - CT 05/04/2019 1. Increased size of 9 mm ground-glass nodule in medial left upper lobe. Recommend continued follow-up by chest CT without contrast in 12 months.  - CT 05/19/2020  C/w progressive met dz > PET rec  C/w met dz ? Primary  In pt with no symptoms at all > rec PET scan then bx per IR of most accessible lesion on the scan, whether that be pulmonary or othwerwise   Discussed in detail all the  indications, usual  risks and alternatives  relative to the benefits with patient who agrees to proceed with w/u as outlined.     Each maintenance medication was reviewed in detail including most importantly the difference between maintenance and as needed and under what circumstances the prns are to be used.  Please see AVS for specific  Instructions which are unique to this visit and I personally typed out  which were reviewed in detail over the phone and assured I will call him when PET back and go from there.

## 2020-05-20 NOTE — Progress Notes (Signed)
Subjective:     Patient ID: Angel Frazier, male   DOB: 12-28-1955,   MRN: [id number]  Brief patient profile:  32 yobm quit smoking 2002 p pna with no residual symptoms and nl baseline cxr and CT ABD with MPN's confirmed on CT chest so referred to pulmonary clinic 09/28/2015 by Dr Carlean Purl.  09/28/2015 1st [facility] office visit/ Mafalda Mcginniss   No symptoms at all - Not limited by breathing from desired activities  / no cough rec We will call you in 6 months to arrange follow up CT chest but you call us in meantime if you develop  shortness of breath, unexplained cough or pain with breathing       CT 05/19/2020  C/w progressive met dz       Virtual Visit via Telephone Note 05/20/2020   I connected with Angel Frazier on 05/20/20 at  9:00 AM EST by telephone and verified that I am speaking with the correct person using two identifiers. Pt is at home and this call made from my office with no other participants    I discussed the limitations, risks, security and privacy concerns of performing an evaluation and management service by telephone and the availability of in person appointments. I also discussed with the patient that there may be a patient responsible charge related to this service. The patient expressed understanding and agreed to proceed.   History of Present Illness: Dyspnea: gym 30-45 min s stopping bike / treadmill/ elipitcal Cough: no mucus  Sleeping: bed is flat / one pillow / no problem SABA use: none 02: none  Good appetite/ no wt loss/  no pain anywhere/ joints fine/   No obvious day to day or daytime variability or assoc excess/ purulent sputum or mucus plugs or hemoptysis or cp or chest tightness, subjective wheeze or overt sinus or hb symptoms.    Also denies any obvious fluctuation of symptoms with weather or environmental changes or other aggravating or alleviating factors except as outlined above.   Meds reviewed/ med reconciliation completed     Current Meds   Medication Sig  . Accu-Chek Softclix Lancets lancets Use to check sugar before meals 3 times daily  . Alcohol Swabs (B-D SINGLE USE SWABS REGULAR) PADS USE AS DIRECTED OR NEEDED  . amLODipine (NORVASC) 5 MG tablet Take 1 tablet (5 mg total) by mouth at bedtime.  . Ascorbic Acid (VITAMIN C) 1000 MG tablet Take 1,000 mg by mouth daily. Reported on 09/28/2015  . aspirin 81 MG tablet Take 81 mg by mouth daily. Reported on 09/28/2015  . atorvastatin (LIPITOR) 40 MG tablet Take 1 tablet (40 mg total) by mouth once a week.  . Blood Glucose Calibration (TRUE METRIX LEVEL 1) Low SOLN Use as directed  . blood glucose meter kit and supplies Dispense based on patient and insurance preference. Use up to four times daily as directed. (FOR ICD-9 250.00, 250.01).  . Blood Glucose Monitoring Suppl (TRUE METRIX METER) w/Device KIT Use daily as directed needed  . Calcium Carbonate-Vitamin D (CALCIUM-VITAMIN D3 PO) Take 1 tablet by mouth daily. Reported on 09/28/2015  . Cholecalciferol (VITAMIN D) 50 MCG (2000 UT) CAPS Take 1 capsule by mouth daily.  . Cyanocobalamin (B-12 PO) 505m daily  . glucose blood (TRUE METRIX BLOOD GLUCOSE TEST) test strip Use as instructed to check blood sugar before meals.  . INSULIN SYRINGE .5CC/29G 29G X 1/2" 0.5 ML MISC Use as directed needed  . mesalamine (LIALDA) 1.2 g EC tablet  Take 2 tablets (2.4 g total) by mouth 2 (two) times daily.  . metFORMIN (GLUCOPHAGE) 1000 MG tablet TAKE 1 TABLET(1000 MG) BY MOUTH TWICE DAILY WITH A MEAL  . Multiple Vitamins-Minerals (CENTRUM SILVER PO) Take 1 tablet by mouth daily. Reported on 09/28/2015  . Omega-3 Fatty Acids (FISH OIL) 300 MG CAPS Take 300 mg by mouth daily.  . repaglinide (PRANDIN) 0.5 MG tablet Take 0.5 tablets (0.25 mg total) by mouth daily with supper.         Observations/Objective: Sounds great - good voice texture, no conversational sob, no spont cough    Assessment and Plan: See problem list for active a/p's   Follow  Up Instructions: See avs for instructions unique to this ov which includes revised/ updated med list     I discussed the assessment and treatment plan with the patient. The patient was provided an opportunity to ask questions and all were answered. The patient agreed with the plan and demonstrated an understanding of the instructions.   The patient was advised to call back or seek an in-person evaluation if the symptoms worsen or if the condition fails to improve as anticipated.  I provided 25  minutes of non-face-to-face time during this encounter.   Christinia Gully, MD

## 2020-06-01 ENCOUNTER — Telehealth: Payer: Self-pay | Admitting: Internal Medicine

## 2020-06-01 DIAGNOSIS — R918 Other nonspecific abnormal finding of lung field: Secondary | ICD-10-CM

## 2020-06-01 NOTE — Telephone Encounter (Signed)
Pt is needing to set up PET scan per providers request please advise

## 2020-06-01 NOTE — Telephone Encounter (Signed)
I have scheduled pt's PET for 1/6.  I spoke to him and gave him appt info.  Nothing further needed.

## 2020-06-01 NOTE — Telephone Encounter (Signed)
Does not look like PET was ordered at last visit; televisit on 12.17.21.  Order has been placed as a STAT order.   Left detailed message for pt of the info above.    PCC's please advise. I have placed the order stat. Thanks.

## 2020-06-09 ENCOUNTER — Other Ambulatory Visit: Payer: Self-pay

## 2020-06-09 ENCOUNTER — Ambulatory Visit (INDEPENDENT_AMBULATORY_CARE_PROVIDER_SITE_OTHER): Payer: Medicare HMO

## 2020-06-09 ENCOUNTER — Ambulatory Visit (HOSPITAL_COMMUNITY)
Admission: RE | Admit: 2020-06-09 | Discharge: 2020-06-09 | Disposition: A | Discharge status: Home / Self Care | Payer: Medicare HMO | Source: Ambulatory Visit | Attending: Internal Medicine | Admitting: Internal Medicine

## 2020-06-09 DIAGNOSIS — Z0000 Encounter for general adult medical examination without abnormal findings: Secondary | ICD-10-CM

## 2020-06-09 DIAGNOSIS — Z1211 Encounter for screening for malignant neoplasm of colon: Secondary | ICD-10-CM

## 2020-06-09 DIAGNOSIS — R918 Other nonspecific abnormal finding of lung field: Secondary | ICD-10-CM | POA: Insufficient documentation

## 2020-06-09 DIAGNOSIS — R911 Solitary pulmonary nodule: Secondary | ICD-10-CM | POA: Diagnosis not present

## 2020-06-09 LAB — GLUCOSE, CAPILLARY: Glucose-Capillary: 137 mg/dL — ABNORMAL HIGH (ref 70–99)

## 2020-06-09 MED ORDER — FLUDEOXYGLUCOSE F - 18 (FDG) INJECTION
12.6000 | Freq: Once | INTRAVENOUS | Status: AC | PRN
  Administered 2020-06-09: 12.6 via INTRAVENOUS

## 2020-06-09 NOTE — Patient Instructions (Signed)
Angel Frazier , Thank you for taking time to come for your Medicare Wellness Visit. I appreciate your ongoing commitment to your health goals. Please review the following plan we discussed and let me know if I can assist you in the future.   Screening recommendations/referrals: Colonoscopy: Order placed 06/09/20 Recommended yearly ophthalmology/optometry visit for glaucoma screening and checkup Recommended yearly dental visit for hygiene and checkup  Vaccinations: Influenza vaccine: Done 1018/21 Up to date Pneumococcal vaccine:Due and discussed Tdap vaccine: Up to date Shingles vaccine: Shingrix discussed. Please contact your pharmacy for coverage information.    Covid-19: Completed 3/22,4/19, & 04/04/20  Advanced directives: Advance directive discussed with you today. I have provided a copy for you to complete at home and have notarized. Once this is complete please bring a copy in to our office so we can scan it into your chart.  Conditions/risks identified: Lose weight   Next appointment: Follow up in one year for your annual wellness visit.   Preventive Care 66 Years and Older, Male Preventive care refers to lifestyle choices and visits with your health care provider that can promote health and wellness. What does preventive care include?  A yearly physical exam. This is also called an annual well check.  Dental exams once or twice a year.  Routine eye exams. Ask your health care provider how often you should have your eyes checked.  Personal lifestyle choices, including:  Daily care of your teeth and gums.  Regular physical activity.  Eating a healthy diet.  Avoiding tobacco and drug use.  Limiting alcohol use.  Practicing safe sex.  Taking low doses of aspirin every day.  Taking vitamin and mineral supplements as recommended by your health care provider. What happens during an annual well check? The services and screenings done by your health care provider during your  annual well check will depend on your age, overall health, lifestyle risk factors, and family history of disease. Counseling  Your health care provider may ask you questions about your:  Alcohol use.  Tobacco use.  Drug use.  Emotional well-being.  Home and relationship well-being.  Sexual activity.  Eating habits.  History of falls.  Memory and ability to understand (cognition).  Work and work Statistician. Screening  You may have the following tests or measurements:  Height, weight, and BMI.  Blood pressure.  Lipid and cholesterol levels. These may be checked every 5 years, or more frequently if you are over 19 years old.  Skin check.  Lung cancer screening. You may have this screening every year starting at age 11 if you have a 30-pack-year history of smoking and currently smoke or have quit within the past 15 years.  Fecal occult blood test (FOBT) of the stool. You may have this test every year starting at age 64.  Flexible sigmoidoscopy or colonoscopy. You may have a sigmoidoscopy every 5 years or a colonoscopy every 10 years starting at age 79.  Prostate cancer screening. Recommendations will vary depending on your family history and other risks.  Hepatitis C blood test.  Hepatitis B blood test.  Sexually transmitted disease (STD) testing.  Diabetes screening. This is done by checking your blood sugar (glucose) after you have not eaten for a while (fasting). You may have this done every 1-3 years.  Abdominal aortic aneurysm (AAA) screening. You may need this if you are a current or former smoker.  Osteoporosis. You may be screened starting at age 10 if you are at high risk. Talk with  your health care provider about your test results, treatment options, and if necessary, the need for more tests. Vaccines  Your health care provider may recommend certain vaccines, such as:  Influenza vaccine. This is recommended every year.  Tetanus, diphtheria, and  acellular pertussis (Tdap, Td) vaccine. You may need a Td booster every 10 years.  Zoster vaccine. You may need this after age 21.  Pneumococcal 13-valent conjugate (PCV13) vaccine. One dose is recommended after age 38.  Pneumococcal polysaccharide (PPSV23) vaccine. One dose is recommended after age 69. Talk to your health care provider about which screenings and vaccines you need and how often you need them. This information is not intended to replace advice given to you by your health care provider. Make sure you discuss any questions you have with your health care provider. Document Released: 06/17/2015 Document Revised: 02/08/2016 Document Reviewed: 03/22/2015 Elsevier Interactive Patient Education  2017 [place] Prevention in the Home Falls can cause injuries. They can happen to people of all ages. There are many things you can do to make your home safe and to help prevent falls. What can I do on the outside of my home?  Regularly fix the edges of walkways and driveways and fix any cracks.  Remove anything that might make you trip as you walk through a door, such as a raised step or threshold.  Trim any bushes or trees on the path to your home.  Use bright outdoor lighting.  Clear any walking paths of anything that might make someone trip, such as rocks or tools.  Regularly check to see if handrails are loose or broken. Make sure that both sides of any steps have handrails.  Any raised decks and porches should have guardrails on the edges.  Have any leaves, snow, or ice cleared regularly.  Use sand or salt on walking paths during winter.  Clean up any spills in your garage right away. This includes oil or grease spills. What can I do in the bathroom?  Use night lights.  Install grab bars by the toilet and in the tub and shower. Do not use towel bars as grab bars.  Use non-skid mats or decals in the tub or shower.  If you need to sit down in the shower, use  a plastic, non-slip stool.  Keep the floor dry. Clean up any water that spills on the floor as soon as it happens.  Remove soap buildup in the tub or shower regularly.  Attach bath mats securely with double-sided non-slip rug tape.  Do not have throw rugs and other things on the floor that can make you trip. What can I do in the bedroom?  Use night lights.  Make sure that you have a light by your bed that is easy to reach.  Do not use any sheets or blankets that are too big for your bed. They should not hang down onto the floor.  Have a firm chair that has side arms. You can use this for support while you get dressed.  Do not have throw rugs and other things on the floor that can make you trip. What can I do in the kitchen?  Clean up any spills right away.  Avoid walking on wet floors.  Keep items that you use a lot in easy-to-reach places.  If you need to reach something above you, use a strong step stool that has a grab bar.  Keep electrical cords out of the way.  Do not  use floor polish or wax that makes floors slippery. If you must use wax, use non-skid floor wax.  Do not have throw rugs and other things on the floor that can make you trip. What can I do with my stairs?  Do not leave any items on the stairs.  Make sure that there are handrails on both sides of the stairs and use them. Fix handrails that are broken or loose. Make sure that handrails are as long as the stairways.  Check any carpeting to make sure that it is firmly attached to the stairs. Fix any carpet that is loose or worn.  Avoid having throw rugs at the top or bottom of the stairs. If you do have throw rugs, attach them to the floor with carpet tape.  Make sure that you have a light switch at the top of the stairs and the bottom of the stairs. If you do not have them, ask someone to add them for you. What else can I do to help prevent falls?  Wear shoes that:  Do not have high heels.  Have  rubber bottoms.  Are comfortable and fit you well.  Are closed at the toe. Do not wear sandals.  If you use a stepladder:  Make sure that it is fully opened. Do not climb a closed stepladder.  Make sure that both sides of the stepladder are locked into place.  Ask someone to hold it for you, if possible.  Clearly mark and make sure that you can see:  Any grab bars or handrails.  First and last steps.  Where the edge of each step is.  Use tools that help you move around (mobility aids) if they are needed. These include:  Canes.  Walkers.  Scooters.  Crutches.  Turn on the lights when you go into a dark area. Replace any light bulbs as soon as they burn out.  Set up your furniture so you have a clear path. Avoid moving your furniture around.  If any of your floors are uneven, fix them.  If there are any pets around you, be aware of where they are.  Review your medicines with your doctor. Some medicines can make you feel dizzy. This can increase your chance of falling. Ask your doctor what other things that you can do to help prevent falls. This information is not intended to replace advice given to you by your health care provider. Make sure you discuss any questions you have with your health care provider. Document Released: 03/17/2009 Document Revised: 10/27/2015 Document Reviewed: 06/25/2014 Elsevier Interactive Patient Education  2017 [employer].

## 2020-06-09 NOTE — Progress Notes (Signed)
Virtual Visit via Telephone Note  I connected with  Angel Frazier on 06/09/20 at  9:30 AM EST by telephone and verified that I am speaking with the correct person using two identifiers.  Medicare Annual Wellness visit completed telephonically due to Covid-19 pandemic.   Persons participating in this call: This Health Coach and this patient.   Location: Patient: Home Provider: Office   I discussed the limitations, risks, security and privacy concerns of performing an evaluation and management service by telephone and the availability of in person appointments. The patient expressed understanding and agreed to proceed.  Unable to perform video visit due to video visit attempted and failed and/or patient does not have video capability.   Some vital signs may be absent or patient reported.   Angel Brace, LPN    Subjective:   Angel Frazier is a 65 y.o. male who presents for Medicare Annual/Subsequent preventive examination.  Review of Systems     Cardiac Risk Factors include: advanced age (>68mn, >>37women);diabetes mellitus;hypertension;male gender;dyslipidemia;obesity (BMI >30kg/m2)     Objective:    There were no vitals filed for this visit. There is no height or weight on file to calculate BMI.  Advanced Directives 06/09/2020 04/16/2019 06/18/2017 09/13/2015 08/20/2015  Does Patient Have a Medical Advance Directive? Yes No No No No  Does patient want to make changes to medical advance directive? Yes (MAU/Ambulatory/Procedural Areas - Information given) - - - -  Would patient like information on creating a medical advance directive? - Yes (MAU/Ambulatory/Procedural Areas - Information given) Yes (MAU/Ambulatory/Procedural Areas - Information given) Yes - Educational materials given No - patient declined information    Current Medications (verified) Outpatient Encounter Medications as of 06/09/2020  Medication Sig  . Accu-Chek Softclix Lancets lancets Use to check sugar before  meals 3 times daily  . Alcohol Swabs (B-D SINGLE USE SWABS REGULAR) PADS USE AS DIRECTED OR NEEDED  . amLODipine (NORVASC) 5 MG tablet Take 1 tablet (5 mg total) by mouth at bedtime.  . Ascorbic Acid (VITAMIN C) 1000 MG tablet Take 1,000 mg by mouth daily. Reported on 09/28/2015  . aspirin 81 MG tablet Take 81 mg by mouth daily. Reported on 09/28/2015  . atorvastatin (LIPITOR) 40 MG tablet Take 1 tablet (40 mg total) by mouth once a week.  . Blood Glucose Calibration (TRUE METRIX LEVEL 1) Low SOLN Use as directed  . blood glucose meter kit and supplies Dispense based on patient and insurance preference. Use up to four times daily as directed. (FOR ICD-9 250.00, 250.01).  . Blood Glucose Monitoring Suppl (TRUE METRIX METER) w/Device KIT Use daily as directed needed  . Calcium Carbonate-Vitamin D (CALCIUM-VITAMIN D3 PO) Take 1 tablet by mouth daily. Reported on 09/28/2015  . Cholecalciferol (VITAMIN D) 50 MCG (2000 UT) CAPS Take 1 capsule by mouth daily.  . Cyanocobalamin (B-12 PO) 50053mdaily  . glucose blood (TRUE METRIX BLOOD GLUCOSE TEST) test strip Use as instructed to check blood sugar before meals.  . mesalamine (LIALDA) 1.2 g EC tablet Take 2 tablets (2.4 g total) by mouth 2 (two) times daily.  . metFORMIN (GLUCOPHAGE) 1000 MG tablet TAKE 1 TABLET(1000 MG) BY MOUTH TWICE DAILY WITH A MEAL  . Multiple Vitamins-Minerals (CENTRUM SILVER PO) Take 1 tablet by mouth daily. Reported on 09/28/2015  . Omega-3 Fatty Acids (FISH OIL) 300 MG CAPS Take 300 mg by mouth daily.  . repaglinide (PRANDIN) 0.5 MG tablet Take 0.5 tablets (0.25 mg total) by mouth daily  with supper.  . [DISCONTINUED] INSULIN SYRINGE .5CC/29G 29G X 1/2" 0.5 ML MISC Use as directed needed   No facility-administered encounter medications on file as of 06/09/2020.    Allergies (verified) Humira [adalimumab] and Mercaptopurine   History: Past Medical History:  Diagnosis Date  .  vitamin D deficiency 01/24/2013   01/23/13 - level 29  - supplements 50k IU weekly x 6 recommended   . Anabolic steroid abuse    PT. DENIES 03/30/19  . Arthritis   . Avascular necrosis of femoral head (HCC)    bilateral  . Bleeding hemorrhoids   . Diabetes mellitus without complication ([place]) 0932   type 2  . Drug-induced acute pancreatitis - 6MP 09/07/2015  . Drug-induced pancreatitis    6 MP  . Erectile dysfunction   . Hypertension   . Left sided ulcerative colitis ([place])   . Psoriasis caused by Humira 04/13/2019  . Rhabdomyolysis 12/25/2018  . Spinal stenosis of lumbar region at multiple levels    Past Surgical History:  Procedure Laterality Date  . COLONOSCOPY     multiple  . KNEE ARTHROSCOPY Right   . SIGMOIDOSCOPY     Family History  Problem Relation Age of Onset  . Diabetes Mother   . Breast cancer Sister   . Diabetes Other        cousin  . Hypertension Other   . Diabetes type I Son   . Colon cancer Neg Hx   . Esophageal cancer Neg Hx   . Rectal cancer Neg Hx   . Stomach cancer Neg Hx   . Colon polyps Neg Hx    Social History   Socioeconomic History  . Marital status: Single    Spouse name: Not on file  . Number of children: 1  . Years of education: Not on file  . Highest education level: Not on file  Occupational History  . Occupation: retired    Fish farm manager: [employer]  Tobacco Use  . Smoking status: Former Smoker    Packs/day: 1.00    Years: 6.00    Pack years: 6.00    Types: Cigarettes    Quit date: 06/04/2000    Years since quitting: 20.0  . Smokeless tobacco: Never Used  Vaping Use  . Vaping Use: Never used  Substance and Sexual Activity  . Alcohol use: Yes    Comment: occasionally  . Drug use: No  . Sexual activity: Not on file  Other Topics Concern  . Not on file  Social History Narrative   Single never married. 1 son 64 in 2018. 1 grandson 103 in 2018. Lives in [place] alone      Disability- low back issues   Roll up machine operator      Hobbies: enjoys tinkering in his  shed, enjoys going to the gym   Social Determinants of Health   Financial Resource Strain: Low Risk   . Difficulty of Paying Living Expenses: Not hard at all  Food Insecurity: No Food Insecurity  . Worried About Charity fundraiser in the Last Year: Never true  . Ran Out of Food in the Last Year: Never true  Transportation Needs: No Transportation Needs  . Lack of Transportation (Medical): No  . Lack of Transportation (Non-Medical): No  Physical Activity: Inactive  . Days of Exercise per Week: 0 days  . Minutes of Exercise per Session: 0 min  Stress: No Stress Concern Present  . Feeling of Stress : Not  at all  Social Connections: Moderately Isolated  . Frequency of Communication with Friends and Family: More than three times a week  . Frequency of Social Gatherings with Friends and Family: Once a week  . Attends Religious Services: 1 to 4 times per year  . Active Member of Clubs or Organizations: No  . Attends Archivist Meetings: Never  . Marital Status: Never married    Tobacco Counseling Counseling given: Not Answered   Clinical Intake:  Pre-visit preparation completed: Yes  Pain : No/denies pain     BMI - recorded: 35.75 Nutritional Status: BMI > 30  Obese Diabetes: Yes CBG done?: Yes (144) CBG resulted in Enter/ Edit results?: No Did pt. bring in CBG monitor from home?: No     Diabetic?Nutrition Risk Assessment:  Has the patient had any N/V/D within the last 2 months?  No  Does the patient have any non-healing wounds?  No  Has the patient had any unintentional weight loss or weight gain?  No   Diabetes:  Is the patient diabetic?  Yes  If diabetic, was a CBG obtained today?  Yes  Did the patient bring in their glucometer from home?  No  How often do you monitor your CBG's? DaIly.   Financial Strains and Diabetes Management:  Are you having any financial strains with the device, your supplies or your medication? No .  Does the patient want  to be seen by Chronic Care Management for management of their diabetes?  No  Would the patient like to be referred to a Nutritionist or for Diabetic Management?  No   Diabetic Exams:  Diabetic Eye Exam: Overdue for diabetic eye exam. Pt has been advised about the importance in completing this exam. Patient advised to call and schedule an eye exam. Diabetic Foot Exam: Completed 02/15/20  Interpreter Needed?: No  Information entered by :: Charlott Rakes, LPN   Activities of Daily Living In your present state of health, do you have any difficulty performing the following activities: 06/09/2020  Hearing? N  Vision? N  Difficulty concentrating or making decisions? N  Walking or climbing stairs? Y  Comment bad knees I go up slowly  Dressing or bathing? N  Doing errands, shopping? N  Preparing Food and eating ? N  Using the Toilet? N  In the past six months, have you accidently leaked urine? N  Do you have problems with loss of bowel control? N  Managing your Medications? N  Managing your Finances? N  Housekeeping or managing your Housekeeping? N  Some recent data might be hidden    Patient Care Team: [name], MD as PCP - General (Family Medicine) Renato Shin, MD as Consulting Physician (Endocrinology) Tanda Rockers, MD as Consulting Physician (Pulmonary Disease) Gatha Mayer, MD as Consulting Physician (Gastroenterology) [name], [facility] as Pharmacist (Pharmacist)  Indicate any recent Medical Services you may have received from other than [facility] providers in the past year (date may be approximate).     Assessment:   This is a routine wellness examination for [place].  Hearing/Vision screen  Hearing Screening   125Hz  250Hz  500Hz  1000Hz  2000Hz  3000Hz  4000Hz  6000Hz  8000Hz   Right ear:           Left ear:           Comments: Pt denies any hearing issues   Vision Screening Comments: [facility] Jenny Reichmann  Dietary issues and exercise activities  discussed:  Goals    . Maintain current health status    . Patient Stated     Lose weight     . PharmD Care Plan     CARE PLAN ENTRY (see longitudinal plan of care for additional care plan information)  Current Barriers:  . Chronic Disease Management support, education, and care coordination needs related to Hypertension, Hyperlipidemia, and Diabetes   Hypertension BP Readings from Last 3 Encounters:  03/21/20 132/74  02/15/20 (!) 142/82  01/22/20 124/82   . Pharmacist Clinical Goal(s): o Over the next 365 days, patient will work with PharmD and providers to maintain BP goal <130/80 . Current regimen:  o Amlodipine 5 mg once daily  . Interventions: o Reviewed home monitoring recommendations o Reviewed diet and exercise - Maintain a healthy weight and exercise regularly, as directed by your health care provider. Eat healthy foods, such as: Lean proteins, complex carbohydrates, fresh fruits and vegetables, low-fat dairy products, healthy fats. . Patient self care activities - Over the next 365 days, patient will: o Check BP at least once every 1-2 weeks, document, and provide at future appointments o Ensure daily salt intake < 2300 mg/day  Hyperlipidemia Lab Results  Component Value Date/Time   LDLCALC 91 09/17/2019 11:28 AM   LDLDIRECT 64.0 12/08/2019 01:24 PM   . Pharmacist Clinical Goal(s): o Over the next 365 days, patient will work with PharmD and providers to maintain LDL goal < 70 . Current regimen:  o Atorvastatin 40 mg once WEEKLY . Interventions: o Reviewed side effects/tolerability - none noted at present. . Patient self care activities - Over the next 365 days, patient will: o Continue current management  Diabetes Lab Results  Component Value Date/Time   HGBA1C 6.7 (A) 02/15/2020 08:09 AM   HGBA1C 6.1 (A) 10/12/2019 09:32 AM   HGBA1C 6.7 (H) 12/04/2018 10:09 AM   HGBA1C 6.3 09/11/2017 08:26 AM   . Pharmacist Clinical Goal(s): o Over the next 365  days, patient will work with PharmD and providers to maintain A1c goal <7% . Current regimen:  . Metformin 1000 mg twice daily with meals . Prandin 0.5 mg tablet - take half (0.25 mg) tablet by mouth once daily with supper . Interventions: o Reviewed plate method for DM diet. o Reviewed PAP for Rybelsus - appears would qualify, agreed to pursue application. . Patient self care activities - Over the next 365 days, patient will: o Check blood sugar once daily, document, and provide at future appointments o Contact provider with any episodes of hypoglycemia  Medication management . Pharmacist Clinical Goal(s): o Over the next 365 days, patient will work with PharmD and providers to maintain optimal medication adherence . [facility] . Interventions o Comprehensive medication review performed. o Continue current medication management strategy. . Patient self care activities - Over the next 365 days, patient will: o Take medications as prescribed o Report any questions or concerns to PharmD and/or provider(s)  Initial goal documentation.      Depression Screen PHQ 2/9 Scores 06/09/2020 09/17/2019 04/16/2019 03/04/2019 01/20/2019 12/04/2018 06/18/2017  PHQ - 2 Score 0 0 0 0 0 3 0  PHQ- 9 Score - 0 - 0 - 3 0    Fall Risk Fall Risk  06/09/2020 12/08/2019 09/17/2019 04/16/2019 01/20/2019  Falls in the past year? 0 0 0 0 0  Number falls in past yr: 0 0 0 - 0  Injury with Fall? 0 0 0 0 0  Risk for  fall due to : Impaired vision - - - -  Follow up Falls prevention discussed - - Education provided;Falls prevention discussed;Falls evaluation completed -    FALL RISK PREVENTION PERTAINING TO THE HOME:  Any stairs in or around the home? Yes  If so, are there any without handrails? Yes  Home free of loose throw rugs in walkways, pet beds, electrical cords, etc? Yes  Adequate lighting in your home to reduce risk of falls? Yes   ASSISTIVE DEVICES UTILIZED  TO PREVENT FALLS:  Life alert? No  Use of a cane, walker or w/c? No  Grab bars in the bathroom? No  Shower chair or bench in shower? No  Elevated toilet seat or a handicapped toilet? Yes   TIMED UP AND GO:  Was the test performed? No .     Cognitive Function:     6CIT Screen 06/09/2020 04/16/2019  What Year? 0 points 0 points  What month? 0 points 0 points  What time? - 0 points  Count back from 20 0 points 0 points  Months in reverse 0 points 0 points  Repeat phrase 4 points 0 points  Total Score - 0    Immunizations Immunization History  Administered Date(s) Administered  . Fluad Quad(high Dose 65+) 03/21/2020  . Influenza Split 02/15/2011  . Influenza, Seasonal, Injecte, Preservative Fre 05/21/2012  . Influenza,inj,Quad PF,6+ Mos 06/03/2013  . Moderna Sars-Covid-2 Vaccination 08/24/2019, 09/21/2019, 04/04/2020  . Td 06/04/2001  . Tdap 05/21/2012    TDAP status: Up to date  Flu Vaccine status: Up to date Done 03/21/20  Pneumococcal vaccine status: Due, Education has been provided regarding the importance of this vaccine. Advised may receive this vaccine at local pharmacy or Health Dept. Aware to provide a copy of the vaccination record if obtained from local pharmacy or Health Dept. Verbalized acceptance and understanding.  Covid-19 vaccine status: Completed vaccines  Qualifies for Shingles Vaccine? Yes   Zostavax completed No   Shingrix Completed?: No.    Education has been provided regarding the importance of this vaccine. Patient has been advised to call insurance company to determine out of pocket expense if they have not yet received this vaccine. Advised may also receive vaccine at local pharmacy or Health Dept. Verbalized acceptance and understanding.  Screening Tests Health Maintenance  Topic Date Due  . COLONOSCOPY (Pts 45-56yr Insurance coverage will need to be confirmed)  04/12/2020  . OPHTHALMOLOGY EXAM  07/05/2020 (Originally 12/17/2019)  . PNA vac  Low Risk Adult (1 of 2 - PCV13) 06/09/2021 (Originally 03/30/2019)  . HEMOGLOBIN A1C  08/14/2020  . URINE MICROALBUMIN  12/07/2020  . FOOT EXAM  02/14/2021  . TETANUS/TDAP  05/21/2022  . INFLUENZA VACCINE  Completed  . COVID-19 Vaccine  Completed  . Hepatitis C Screening  Completed    Health Maintenance  Health Maintenance Due  Topic Date Due  . COLONOSCOPY (Pts 45-428yrInsurance coverage will need to be confirmed)  04/12/2020    Colorectal cancer screening: Referral to GI placed 06/09/20. Pt aware the office will call re: appt.   Additional Screening:  Hepatitis C Screening:  Completed 09/07/16  Vision Screening: Recommended annual ophthalmology exams for early detection of glaucoma and other disorders of the eye. Is the patient up to date with their annual eye exam?  Yes  Who is the provider or what is the name of the office in which the patient attends annual eye exams?[facility], Dr JoJenny Reichmann Dental Screening: Recommended annual dental exams for  proper oral hygiene  Community Resource Referral / Chronic Care Management: CRR required this visit?  No   CCM required this visit?  No      Plan:     I have personally reviewed and noted the following in the patient's chart:   . Medical and social history . Use of alcohol, tobacco or illicit drugs  . Current medications and supplements . Functional ability and status . Nutritional status . Physical activity . Advanced directives . List of other physicians . Hospitalizations, surgeries, and ER visits in previous 12 months . Vitals . Screenings to include cognitive, depression, and falls . Referrals and appointments  In addition, I have reviewed and discussed with patient certain preventive protocols, quality metrics, and best practice recommendations. A written personalized care plan for preventive services as well as general preventive health recommendations were provided to patient.     Angel Brace,  LPN   07/06/252   Nurse Notes: None

## 2020-06-14 NOTE — Progress Notes (Signed)
LMTCB

## 2020-06-15 ENCOUNTER — Telehealth: Payer: Self-pay | Admitting: Internal Medicine

## 2020-06-15 NOTE — Telephone Encounter (Signed)
Call patient : main finding is concern is not the lung but a hot spot on prostate so should see his urologist as soon as possible (the ones in [place] have access to the scan)   Called and spoke with pt and he is aware of results of PET scan per MW>  He will call over to the urologist office to get appt set up.

## 2020-06-22 DIAGNOSIS — E119 Type 2 diabetes mellitus without complications: Secondary | ICD-10-CM | POA: Diagnosis not present

## 2020-06-22 DIAGNOSIS — Z0100 Encounter for examination of eyes and vision without abnormal findings: Secondary | ICD-10-CM | POA: Diagnosis not present

## 2020-06-22 DIAGNOSIS — R9349 Abnormal radiologic findings on diagnostic imaging of other urinary organs: Secondary | ICD-10-CM | POA: Diagnosis not present

## 2020-06-22 DIAGNOSIS — N5201 Erectile dysfunction due to arterial insufficiency: Secondary | ICD-10-CM | POA: Diagnosis not present

## 2020-06-22 DIAGNOSIS — N400 Enlarged prostate without lower urinary tract symptoms: Secondary | ICD-10-CM | POA: Diagnosis not present

## 2020-06-22 LAB — HM DIABETES EYE EXAM

## 2020-06-24 ENCOUNTER — Telehealth: Payer: Self-pay | Admitting: *Deleted

## 2020-06-24 ENCOUNTER — Other Ambulatory Visit: Payer: Self-pay | Admitting: Urology

## 2020-06-24 DIAGNOSIS — R9349 Abnormal radiologic findings on diagnostic imaging of other urinary organs: Secondary | ICD-10-CM

## 2020-06-24 NOTE — Telephone Encounter (Signed)
-----   Message from Tanda Rockers, MD sent at 06/09/2020  5:06 PM EST ----- Needs f/u ov 09/07/20 re multiple pulmonary nodules

## 2020-06-24 NOTE — Telephone Encounter (Signed)
LMTCB

## 2020-06-27 ENCOUNTER — Other Ambulatory Visit: Payer: Self-pay | Admitting: *Deleted

## 2020-06-27 ENCOUNTER — Telehealth: Payer: Self-pay | Admitting: Endocrinology

## 2020-06-27 MED ORDER — REPAGLINIDE 0.5 MG PO TABS: 0.2500 mg | ORAL_TABLET | Freq: Every day | ORAL | 1 refills | Status: DC

## 2020-06-27 NOTE — Telephone Encounter (Signed)
Pt called to request a refill for repaglinide (PRANDIN) 0.5 MG tablet please send to:  PHARMACY:   [facility] [address] [place], [address] AT [place] Phone:  [phone]  Fax:  [phone]

## 2020-06-27 NOTE — Telephone Encounter (Signed)
Rx sent 

## 2020-07-16 ENCOUNTER — Ambulatory Visit
Admission: RE | Admit: 2020-07-16 | Discharge: 2020-07-16 | Disposition: A | Discharge status: Home / Self Care | Payer: Medicare HMO | Source: Ambulatory Visit | Attending: Urology | Admitting: Urology

## 2020-07-16 ENCOUNTER — Other Ambulatory Visit: Payer: Self-pay

## 2020-07-16 DIAGNOSIS — N400 Enlarged prostate without lower urinary tract symptoms: Secondary | ICD-10-CM | POA: Diagnosis not present

## 2020-07-16 DIAGNOSIS — R9349 Abnormal radiologic findings on diagnostic imaging of other urinary organs: Secondary | ICD-10-CM

## 2020-07-16 DIAGNOSIS — R590 Localized enlarged lymph nodes: Secondary | ICD-10-CM | POA: Diagnosis not present

## 2020-07-16 MED ORDER — GADOBENATE DIMEGLUMINE 529 MG/ML IV SOLN
20.0000 mL | Freq: Once | INTRAVENOUS | Status: AC | PRN
  Administered 2020-07-16: 20 mL via INTRAVENOUS

## 2020-07-20 ENCOUNTER — Ambulatory Visit: Payer: Medicare HMO | Admitting: Endocrinology

## 2020-08-02 HISTORY — PX: PROSTATE BIOPSY: SHX241

## 2020-08-09 ENCOUNTER — Other Ambulatory Visit: Payer: Self-pay

## 2020-08-09 ENCOUNTER — Encounter: Payer: Self-pay | Admitting: Family Medicine

## 2020-08-09 ENCOUNTER — Ambulatory Visit (INDEPENDENT_AMBULATORY_CARE_PROVIDER_SITE_OTHER): Payer: Medicare HMO | Admitting: Family Medicine

## 2020-08-09 ENCOUNTER — Telehealth: Payer: Self-pay

## 2020-08-09 VITALS — BP 128/88 | HR 80 | Temp 98.2°F | Ht 71.0 in | Wt 256.6 lb

## 2020-08-09 DIAGNOSIS — K5150 Left sided colitis without complications: Secondary | ICD-10-CM | POA: Diagnosis not present

## 2020-08-09 DIAGNOSIS — I152 Hypertension secondary to endocrine disorders: Secondary | ICD-10-CM

## 2020-08-09 DIAGNOSIS — Z87891 Personal history of nicotine dependence: Secondary | ICD-10-CM

## 2020-08-09 DIAGNOSIS — E559 Vitamin D deficiency, unspecified: Secondary | ICD-10-CM | POA: Diagnosis not present

## 2020-08-09 DIAGNOSIS — Z0000 Encounter for general adult medical examination without abnormal findings: Secondary | ICD-10-CM

## 2020-08-09 DIAGNOSIS — E119 Type 2 diabetes mellitus without complications: Secondary | ICD-10-CM

## 2020-08-09 DIAGNOSIS — E785 Hyperlipidemia, unspecified: Secondary | ICD-10-CM

## 2020-08-09 DIAGNOSIS — E1169 Type 2 diabetes mellitus with other specified complication: Secondary | ICD-10-CM

## 2020-08-09 DIAGNOSIS — E1159 Type 2 diabetes mellitus with other circulatory complications: Secondary | ICD-10-CM | POA: Diagnosis not present

## 2020-08-09 DIAGNOSIS — R351 Nocturia: Secondary | ICD-10-CM | POA: Diagnosis not present

## 2020-08-09 LAB — CBC WITH DIFFERENTIAL/PLATELET
Basophils Absolute: 0 10*3/uL (ref 0.0–0.1)
Basophils Relative: 0.4 % (ref 0.0–3.0)
Eosinophils Absolute: 0.2 10*3/uL (ref 0.0–0.7)
Eosinophils Relative: 5 % (ref 0.0–5.0)
HCT: 39.6 % (ref 39.0–52.0)
Hemoglobin: 13.4 g/dL (ref 13.0–17.0)
Lymphocytes Relative: 34.1 % (ref 12.0–46.0)
Lymphs Abs: 1.5 10*3/uL (ref 0.7–4.0)
MCHC: 33.8 g/dL (ref 30.0–36.0)
MCV: 91.1 fl (ref 78.0–100.0)
Monocytes Absolute: 0.6 10*3/uL (ref 0.1–1.0)
Monocytes Relative: 12.8 % — ABNORMAL HIGH (ref 3.0–12.0)
Neutro Abs: 2.1 10*3/uL (ref 1.4–7.7)
Neutrophils Relative %: 47.7 % (ref 43.0–77.0)
Platelets: 219 10*3/uL (ref 150.0–400.0)
RBC: 4.34 Mil/uL (ref 4.22–5.81)
RDW: 13 % (ref 11.5–15.5)
WBC: 4.4 10*3/uL (ref 4.0–10.5)

## 2020-08-09 LAB — COMPREHENSIVE METABOLIC PANEL
ALT: 26 U/L (ref 0–53)
AST: 36 U/L (ref 0–37)
Albumin: 4.1 g/dL (ref 3.5–5.2)
Alkaline Phosphatase: 39 U/L (ref 39–117)
BUN: 14 mg/dL (ref 6–23)
CO2: 25 mEq/L (ref 19–32)
Calcium: 9.2 mg/dL (ref 8.4–10.5)
Chloride: 104 mEq/L (ref 96–112)
Creatinine, Ser: 0.82 mg/dL (ref 0.40–1.50)
GFR: 91.63 mL/min (ref >60.00)
Glucose, Bld: 117 mg/dL — ABNORMAL HIGH (ref 70–99)
Potassium: 3.6 mEq/L (ref 3.5–5.1)
Sodium: 140 mEq/L (ref 135–145)
Total Bilirubin: 0.7 mg/dL (ref 0.2–1.2)
Total Protein: 7 g/dL (ref 6.0–8.3)

## 2020-08-09 LAB — LIPID PANEL
Cholesterol: 102 mg/dL (ref 0–200)
HDL: 31.1 mg/dL — ABNORMAL LOW (ref >39.00)
LDL Cholesterol: 58 mg/dL (ref 0–99)
NonHDL: 71.01
Total CHOL/HDL Ratio: 3
Triglycerides: 63 mg/dL (ref 0.0–149.0)
VLDL: 12.6 mg/dL (ref 0.0–40.0)

## 2020-08-09 LAB — VITAMIN D 25 HYDROXY (VIT D DEFICIENCY, FRACTURES): VITD: 60.19 ng/mL (ref 30.00–100.00)

## 2020-08-09 LAB — HEMOGLOBIN A1C: Hgb A1c MFr Bld: 7.1 % — ABNORMAL HIGH (ref 4.6–6.5)

## 2020-08-09 NOTE — Telephone Encounter (Signed)
Spoke with patient. Patient is aware of his lab results.

## 2020-08-09 NOTE — Progress Notes (Signed)
Phone: [phone]   Subjective:  Patient presents today for their annual physical. Chief complaint-noted.   See problem oriented charting- ROS- full  review of systems was completed and negative  except for: some anxiety with upcoming prostate biopsy  The following were reviewed and entered/updated in epic: Past Medical History:  Diagnosis Date  .  vitamin D deficiency 01/24/2013   01/23/13 - level 29 - supplements 50k IU weekly x 6 recommended   . Anabolic steroid abuse    PT. DENIES 03/30/19  . Arthritis   . Avascular necrosis of femoral head (HCC)    bilateral  . Bleeding hemorrhoids   . Diabetes mellitus without complication ([place]) 0272   type 2  . Drug-induced acute pancreatitis - 6MP 09/07/2015  . Drug-induced pancreatitis    6 MP  . Erectile dysfunction   . Hypertension   . Left sided ulcerative colitis ([place])   . Psoriasis caused by Humira 04/13/2019  . Rhabdomyolysis 12/25/2018  . Spinal stenosis of lumbar region at multiple levels    Patient Active Problem List   Diagnosis Date Noted  . Diabetes mellitus type II, controlled ([place]) 12/22/2018    Priority: High  . LEFT SIDED ULCERATIVE COLITIS 08/11/2008    Priority: High  . Hyperlipidemia associated with type 2 diabetes mellitus ([place]) 01/20/2019    Priority: Medium  . Hypertension associated with diabetes ([place]) 01/20/2019    Priority: Medium  . Pulmonary nodules 09/07/2015    Priority: Medium  . Former smoker 09/17/2019    Priority: Low  . Long term current use of systemic steroids 11/18/2014    Priority: Low  . History of avascular necrosis  11/18/2014    Priority: Low  . Vitamin D deficiency 01/24/2013    Priority: Low  . ED (erectile dysfunction) 05/21/2012    Priority: Low  . Psoriasis caused by Humira 04/13/2019   Past Surgical History:  Procedure Laterality Date  . COLONOSCOPY     multiple  . KNEE ARTHROSCOPY Right   . SIGMOIDOSCOPY      Family History  Problem Relation Age of Onset  .  Diabetes Mother   . Breast cancer Sister   . Diabetes Other        cousin  . Hypertension Other   . Diabetes type I Son   . Colon cancer Neg Hx   . Esophageal cancer Neg Hx   . Rectal cancer Neg Hx   . Stomach cancer Neg Hx   . Colon polyps Neg Hx     Medications- reviewed and updated Current Outpatient Medications  Medication Sig Dispense Refill  . Accu-Chek Softclix Lancets lancets Use to check sugar before meals 3 times daily 300 each 0  . Alcohol Swabs (B-D SINGLE USE SWABS REGULAR) PADS USE AS DIRECTED OR NEEDED 300 each 12  . amLODipine (NORVASC) 5 MG tablet Take 1 tablet (5 mg total) by mouth at bedtime. 90 tablet 3  . Ascorbic Acid (VITAMIN C) 1000 MG tablet Take 1,000 mg by mouth daily. Reported on 09/28/2015    . aspirin 81 MG tablet Take 81 mg by mouth daily. Reported on 09/28/2015    . atorvastatin (LIPITOR) 40 MG tablet Take 1 tablet (40 mg total) by mouth once a week. 13 tablet 3  . Blood Glucose Calibration (TRUE METRIX LEVEL 1) Low SOLN Use as directed 1 each 1  . blood glucose meter kit and supplies Dispense based on patient and insurance preference. Use up to four times daily as directed. (FOR  ICD-9 250.00, 250.01). 1 each 0  . Blood Glucose Monitoring Suppl (TRUE METRIX METER) w/Device KIT Use daily as directed needed 1 kit 1  . Calcium Carbonate-Vitamin D (CALCIUM-VITAMIN D3 PO) Take 1 tablet by mouth daily. Reported on 09/28/2015    . Cholecalciferol (VITAMIN D) 50 MCG (2000 UT) CAPS Take 1 capsule by mouth daily.    . Cyanocobalamin (B-12 PO) 5059m daily    . glucose blood (TRUE METRIX BLOOD GLUCOSE TEST) test strip Use as instructed to check blood sugar before meals. 300 each 12  . mesalamine (LIALDA) 1.2 g EC tablet Take 2 tablets (2.4 g total) by mouth 2 (two) times daily. 360 tablet 3  . metFORMIN (GLUCOPHAGE) 1000 MG tablet TAKE 1 TABLET(1000 MG) BY MOUTH TWICE DAILY WITH A MEAL 180 tablet 3  . Multiple Vitamins-Minerals (CENTRUM SILVER PO) Take 1 tablet by  mouth daily. Reported on 09/28/2015    . Omega-3 Fatty Acids (FISH OIL) 300 MG CAPS Take 300 mg by mouth daily.    . repaglinide (PRANDIN) 0.5 MG tablet Take 0.5 tablets (0.25 mg total) by mouth daily with supper. 30 tablet 1   No current facility-administered medications for this visit.    Allergies-reviewed and updated Allergies  Allergen Reactions  . Humira [Adalimumab] Rash    psoriasis  . Mercaptopurine Other (See Comments)    Caused Pancreatitis    Social History   Social History Narrative   Single never married. 1 son 473in 2018. 1 grandson 130in 2018. 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 Oropharynx normal Neck: no thyromegaly CV: RRR no murmurs rubs or gallops Lungs: CTAB no crackles, wheeze, rhonchi Abdomen: soft/nontender/nondistended/normal bowel sounds. No rebound or guarding.  Ext: no edema Skin: warm, dry Neuro: grossly normal, moves all extremities, PERRLA   Assessment and Plan  65y.o. male presenting for annual physical.  Health Maintenance counseling: 1. Anticipatory guidance: Patient counseled regarding regular dental exams - needs to get this scheduled- we discussed q6 months, eye exams -yearly,  avoiding smoking and second hand smoke , limiting alcohol to 2 beverages per day .   2. Risk factor reduction:  Advised patient of need for regular exercise and diet rich and fruits and vegetables to reduce risk of heart attack and stroke. Exercise- doing treadmill and biking once or twice a week. Diet-up 2 lbs on his home scale over last month,  Weight last physical 250- does a lot of baked chicken and fish,  tries to limit fried foods- discussed gradual weight loss Wt Readings from Last 3 Encounters:  08/09/20 256 lb 9.6 oz (116.4 kg)  03/21/20 256 lb 3.2 oz (116.2 kg)  02/15/20 258 lb 9.6 oz (117.3 kg)  3. Immunizations/screenings/ancillary studies- up to date other than shingrix- discussed at pharmacy Immunization History  Administered Date(s) Administered  . Fluad Quad(high Dose 65+) 03/21/2020  . Influenza Split 02/15/2011  . Influenza, Seasonal, Injecte, Preservative Fre 05/21/2012  . Influenza,inj,Quad PF,6+ Mos 06/03/2013  . MCallahanSars-Covid-2 Vaccination  08/24/2019, 09/21/2019, 04/04/2020  . Td 06/04/2001  . Tdap 05/21/2012  4. Prostate cancer screening- patient with MRI of the prostate 07/16/2020-following with Dr. Jeffie Pollock- looks like has biopsy planned in april Lab Results  Component Value Date   PSA 0.76 09/17/2019   PSA 0.90 12/04/2018   PSA 1.17 09/11/2017   5. Colon cancer screening - 04/13/2019 with 1 year repeat due to UC- this is getting slightly delayed due to prostate issues 6. Skin cancer screening-patient has seen dermatology due to psoriasis worse on hands from Humira- not seeing them anymore after this improved.Denies worrisome, changing, or new skin lesions.  7.  Former smoker-patient quit smoking in 2002.  UAs with urology now.  -CT abdomen and pelvis from August 19, 2015 without mention of aneurysm-do not need to repeat evaluation with AAA scan 8. STD screening - monogamous with GF  Status of chronic or acute concerns   #hypertension S: medication: amlodipine 5 mg Home readings #s: home readings sometimes In 130s but can be up to 140s. diastolics can be high 07H BP Readings from Last 3 Encounters:  08/09/20 128/88  03/21/20 132/74  02/15/20 (!) 142/82  A/P: blood pressure controlled systolic but diastolic very high normal - discussed working on lifestyle and if not improving could add a light diuretic like hctz 12.5 mg  #hyperlipidemia S:  Medication:Atorvastatin 40 mg weekly, also takes aspirin for primary prevention Lab Results  Component Value Date   CHOL 149 09/17/2019   HDL 46.80 09/17/2019   LDLCALC 91 09/17/2019   LDLDIRECT 64.0 12/08/2019   TRIG 55.0 09/17/2019   CHOLHDL 3 09/17/2019  A/P: last LDL under 70- update full lipid panel today  % Diabetes-follows with endocrinology Dr. Loanne Drilling with A1c typically under 7 S: Medication:Metformin 1000 mg twice daily, repaglinide 0.5 mg with supper Exercise and diet- see above Lab Results  Component Value Date   HGBA1C 6.7 (A) 02/15/2020  A/P: has been controlled and has follow up with Dr. Loanne Drilling. We will check a1c and try to forward to Dr. Loanne Drilling -I recommended also taking B12 since he is on Metformin- he is taking this  #Ulcerative colitis-follows with GI S: Medication: Lialda. No abdominal pain or blood in stool -humira lead to psoriasis A/P: reasonable control- continue current meds   #Vitamin D deficiency S: Medication: 1000 units A/P: suspect controlled- update Vitamin D today   #Pulmonary nodule follows with Dr. Melvyn Novas -patient has regular follow-up with pulmonology with CT scan typically in November -Patient ended up needing PET scan June 09, 2020 and ultimately the most dangerous area was noted to be within his prostate and was referred to urology- has visit in April for biopsy it sounds like.  -sees Dr. Melvyn Novas next week  Recommended follow up: Return in about 4 months (around 12/09/2020) for follow up- or sooner if needed. Future Appointments  Date Time Provider [place]  08/16/2020 11:30 AM Tanda Rockers, MD [facility] None  08/19/2020  8:00 AM Renato Shin, MD [facility] None  05/03/2021  1:30 PM [facility] CCM PHARMACIST [facility]  06/12/2021  9:30 AM [facility] HEALTH COACH [facility]   Lab/Order associations: fasting   ICD-10-CM   1. Preventative health care  Z00.00 CBC with Differential/Platelet    Comprehensive metabolic panel     Lipid panel    Hemoglobin A1c    VITAMIN D 25 Hydroxy (Vit-D Deficiency, Fractures)  2. Hypertension associated with diabetes ([place])  E11.59 CBC with Differential/Platelet   I15.2 Comprehensive metabolic panel  Lipid panel  3. Hyperlipidemia associated with type 2 diabetes mellitus (HCC)  E11.69 CBC with Differential/Platelet   E78.5 Comprehensive metabolic panel    Lipid panel  4. Controlled type 2 diabetes mellitus without complication, without long-term current use of insulin (HCC)  E11.9 Hemoglobin A1c  5. Vitamin D deficiency  E55.9 VITAMIN D 25 Hydroxy (Vit-D Deficiency, Fractures)  6. Former smoker  Z87.891   20. Nocturia  R35.1   8. LEFT SIDED ULCERATIVE COLITIS  K51.50     No orders of the defined types were placed in this encounter.   Return precautions advised.  [name], MD

## 2020-08-09 NOTE — Patient Instructions (Addendum)
Please stop by lab before you go If you have mychart- we will send your results within 3 business days of [place] receiving them.  If you do not have mychart- we will call you about results within 5 business days of [place] receiving them.  *please also note that you will see labs on mychart as soon as they post. I will later go in and write notes on them- will say "notes from Dr. Yong Channel"  Health Maintenance Due  Topic Date Due  . OPHTHALMOLOGY EXAM Sign release of information at the check out desk for  12/17/2019  . COLONOSCOPY - once you are done with biopsy/prostate workup lets get your colonoscopy done 04/12/2020   Please check with your pharmacy to see if they have the shingrix vaccine. If they do- please get this immunization and update [place] by phone call or mychart with dates you receive the vaccine  Lets set a goal of at least 3-5 lbs down between now and next visit. Gradual reduction in calorie intake.   Recommended follow up: Return in about 4 months (around 12/09/2020) for follow up- or sooner if needed.

## 2020-08-09 NOTE — Telephone Encounter (Signed)
Patient is returning a call from [place].

## 2020-08-16 ENCOUNTER — Other Ambulatory Visit: Payer: Self-pay

## 2020-08-16 ENCOUNTER — Encounter: Payer: Self-pay | Admitting: Internal Medicine

## 2020-08-16 ENCOUNTER — Ambulatory Visit: Payer: Medicare HMO | Admitting: Internal Medicine

## 2020-08-16 DIAGNOSIS — R918 Other nonspecific abnormal finding of lung field: Secondary | ICD-10-CM

## 2020-08-16 NOTE — Patient Instructions (Addendum)
Please schedule a follow up visit in 6  Months (from last study so 1st week in June 2022 with cxr)  but call sooner if needed  - cough or worse breathing or pain with deep breath.

## 2020-08-16 NOTE — Progress Notes (Signed)
Subjective:     Patient ID: Angel Frazier, male   DOB: [birth year],   MRN: [id number]  HPI  46 yobm quit smoking 2002 p pna with no residual symptoms and nl baseline cxr and CT ABD for ulcerative colitis  with MPN's confirmed on CT chest so referred to pulmonary clinic 09/28/2015 by Dr Carlean Purl.  09/28/2015 1st [facility] office visit/ [facility]   No symptoms at all - Not limited by breathing from desired activities  / no cough rec CT 04/24/16 1. Stable numerous (at least 12) solid pulmonary nodules scattered throughout both lungs measuring up to 9 mm in the right lower lobe, for which 6 month stability has been demonstrated, suggesting a benign etiology.  2. Stable apical right upper lobe 1.1 cm ground-glass pulmonary nodule. Stable medial right middle lobe ground-glass and possibly cavitary 1.0 cm pulmonary nodule. Given persistence, repeat CT is recommended every 2 years until 5 years of stability   - CT chest 04/29/2017 1. Scattered solid pulmonary nodules measure 8 mm or less in size, are unchanged from 09/21/2015 and are considered benign. 2. 10 mm ground-glass nodule in the medial segment right middle lobe, stable. Follow-up in 2 years is recommended, as clinically indicated, as a low-grade adenocarcinoma cannot be excluded   - CT 05/04/2019 1. Increased size of 9 mm ground-glass nodule in medial left upper lobe. Recommend continued follow-up by chest CT without contrast in 12 months.  - CT 05/19/2020  C/w progressive met dz > - PET 06/09/2020 Persistent bilateral solid and ground-glass pulmonary nodules show low-grade FDG uptake; low-grade carcinoma cannot be excluded. No evidence of thoracic lymph node or distant metastatic disease. Focal FDG uptake in the right prostate peripheral zone, which may be due to high-grade prostate carcinoma or prostatitis  >>> urology referral 06/09/2020 >>>  - f/u pulmonary ov 09/07/20 to regroup   08/16/2020  f/u ov/[facility] re: mpns in pt with UC Chief  Complaint  Patient presents with  . Follow-up    Breathing is doing well. No new co's today.   Dyspnea:  About the same on treadmill at higher wt vs one year prior Cough: none  Sleeping: able to lie flat ok  SABA use: none  02: none  Covid status:  vax x 3    No obvious day to day or daytime variability or assoc excess/ purulent sputum or mucus plugs or hemoptysis or cp or chest tightness, subjective wheeze or overt sinus or hb symptoms.   Sleeping as above without nocturnal  or early am exacerbation  of respiratory  c/o's or need for noct saba. Also denies any obvious fluctuation of symptoms with weather or environmental changes or other aggravating or alleviating factors except as outlined above   No unusual exposure hx or h/o childhood pna/ asthma or knowledge of premature birth.  Current Allergies, Complete Past Medical History, Past Surgical History, Family History, and Social History were reviewed in [employer] record.  ROS  The following are not active complaints unless bolded Hoarseness, sore throat, dysphagia, dental problems, itching, sneezing,  nasal congestion or discharge of excess mucus or purulent secretions, ear ache,   fever, chills, sweats, unintended wt loss or wt gain, classically pleuritic or exertional cp,  orthopnea pnd or arm/hand swelling  or leg swelling, presyncope, palpitations, abdominal pain, anorexia, nausea, vomiting, diarrhea  or change in bowel habits or change in bladder habits, change in stools or change in urine, dysuria, hematuria,  rash, arthralgias, visual complaints, headache, numbness,  weakness or ataxia or problems with walking or coordination,  change in mood or  memory.        Current Meds  Medication Sig  . Accu-Chek Softclix Lancets lancets Use to check sugar before meals 3 times daily  . Alcohol Swabs (B-D SINGLE USE SWABS REGULAR) PADS USE AS DIRECTED OR NEEDED  . amLODipine (NORVASC) 5 MG tablet Take 1 tablet (5 mg  total) by mouth at bedtime.  . Ascorbic Acid (VITAMIN C) 1000 MG tablet Take 1,000 mg by mouth daily. Reported on 09/28/2015  . aspirin 81 MG tablet Take 81 mg by mouth daily. Reported on 09/28/2015  . atorvastatin (LIPITOR) 40 MG tablet Take 1 tablet (40 mg total) by mouth once a week.  . Blood Glucose Calibration (TRUE METRIX LEVEL 1) Low SOLN Use as directed  . blood glucose meter kit and supplies Dispense based on patient and insurance preference. Use up to four times daily as directed. (FOR ICD-9 250.00, 250.01).  . Blood Glucose Monitoring Suppl (TRUE METRIX METER) w/Device KIT Use daily as directed needed  . Calcium Carbonate-Vitamin D (CALCIUM-VITAMIN D3 PO) Take 1 tablet by mouth daily. Reported on 09/28/2015  . Cholecalciferol (VITAMIN D) 50 MCG (2000 UT) CAPS Take 1 capsule by mouth daily.  . Cyanocobalamin (B-12 PO) 5059m daily  . glucose blood (TRUE METRIX BLOOD GLUCOSE TEST) test strip Use as instructed to check blood sugar before meals.  . mesalamine (LIALDA) 1.2 g EC tablet Take 2 tablets (2.4 g total) by mouth 2 (two) times daily.  . metFORMIN (GLUCOPHAGE) 1000 MG tablet TAKE 1 TABLET(1000 MG) BY MOUTH TWICE DAILY WITH A MEAL  . Multiple Vitamins-Minerals (CENTRUM SILVER PO) Take 1 tablet by mouth daily. Reported on 09/28/2015  . Omega-3 Fatty Acids (FISH OIL) 300 MG CAPS Take 300 mg by mouth daily.  . repaglinide (PRANDIN) 0.5 MG tablet Take 0.5 tablets (0.25 mg total) by mouth daily with supper.                Objective:   Physical Exam     08/16/2020      256   09/28/15 243 lb (110.224 kg)  09/26/15 243 lb (110.224 kg)  09/13/15 241 lb 8 oz (109.544 kg)    Vital signs reviewed  08/16/2020  - Note at rest 02 sats  97% on RA   General appearance:    amb pleasant bm     HEENT : pt wearing mask not removed for exam due to covid -19 concerns.    NECK :  without JVD/Nodes/TM/ nl carotid upstrokes bilaterally   LUNGS: no acc muscle use,  Nl contour chest which is  clear to A and P bilaterally without cough on insp or exp maneuvers   CV:  RRR  no s3 or murmur or increase in P2, and no edema   ABD:  soft and nontender with nl inspiratory excursion in the supine position. No bruits or organomegaly appreciated, bowel sounds nl  MS:  Nl gait/ ext warm without deformities, calf tenderness, cyanosis or clubbing No obvious joint restrictions   SKIN: warm and dry without lesions    NEURO:  alert, approp, nl sensorium with  no motor or cerebellar deficits apparent.             Assessment:

## 2020-08-16 NOTE — Assessment & Plan Note (Addendum)
1st detected 2017 on abd ct CT 09/25/15 Multiple bilateral pulmonary nodules, measuring up to 9 mm  - CT 04/24/16 1. Stable numerous (at least 12) solid pulmonary nodules scattered throughout both lungs measuring up to 9 mm in the right lower lobe, for which 6 month stability has been demonstrated, suggesting a benign etiology.  2. Stable apical right upper lobe 1.1 cm ground-glass pulmonary nodule. Stable medial right middle lobe ground-glass and possibly cavitary 1.0 cm pulmonary nodule. Given persistence, repeat CT is recommended every 2 years until 5 years of stability   - CT chest 04/29/2017 1. Scattered solid pulmonary nodules measure 8 mm or less in size, are unchanged from 09/21/2015 and are considered benign. 2. 10 mm ground-glass nodule in the medial segment right middle lobe, stable. Follow-up in 2 years is recommended, as clinically indicated, as a low-grade adenocarcinoma cannot be excluded   - CT 05/04/2019 1. Increased size of 9 mm ground-glass nodule in medial left upper lobe. Recommend continued follow-up by chest CT without contrast in 12 months.  - CT 05/19/2020  C/w progressive met dz > - PET 06/09/2020 Persistent bilateral solid and ground-glass pulmonary nodules show low-grade FDG uptake; low-grade carcinoma cannot be excluded. No evidence of thoracic lymph node or distant metastatic disease. Focal FDG uptake in the right prostate peripheral zone, which may be due to high-grade prostate carcinoma or prostatitis  >>> urology referral 06/09/2020 >>>   In setting of UC and possible prostate ca don't feel immediate need to sample any of the MPNs but will let Dr Jeffie Pollock and Carlean Purl complete their eval/ rx first - Discussed in detail all the  indications, usual  risks and alternatives  relative to the benefits with patient who agrees to proceed with conservative f/u with cxr to consider bx of any "macroscopic" nodules that develop in the interim.          Each maintenance  medication was reviewed in detail including emphasizing most importantly the difference between maintenance and prns and under what circumstances the prns are to be triggered using an action plan format where appropriate.  Total time for H and P, chart review, counseling, reviewing   and generating customized AVS unique to this office visit / same day charting = 33mn

## 2020-08-17 ENCOUNTER — Telehealth: Payer: Self-pay | Admitting: *Deleted

## 2020-08-17 NOTE — Telephone Encounter (Signed)
-----   Message from Tanda Rockers, MD sent at 08/16/2020 12:05 PM EDT -----  Cxr @ 6 m (from last study so 1st week in June 2022 with cxr)

## 2020-08-17 NOTE — Telephone Encounter (Signed)
LMTCB- needs to move up f/u to June 2022 so that we ca repeat his CXR 6 months after the last. Will await call back.

## 2020-08-17 NOTE — Telephone Encounter (Signed)
08/17/2020 04:49 PM Phone (Incoming) Angel Frazier D (Self) [phone] (H)   Pt returned call ptis currently scheduled for 6.15.22.     By Octaviano Glow

## 2020-08-19 ENCOUNTER — Other Ambulatory Visit: Payer: Self-pay

## 2020-08-19 ENCOUNTER — Ambulatory Visit: Payer: Medicare HMO | Admitting: Endocrinology

## 2020-08-19 VITALS — BP 130/80 | HR 79 | Ht 71.0 in | Wt 254.2 lb

## 2020-08-19 DIAGNOSIS — E119 Type 2 diabetes mellitus without complications: Secondary | ICD-10-CM | POA: Diagnosis not present

## 2020-08-19 LAB — POCT GLYCOSYLATED HEMOGLOBIN (HGB A1C): Hemoglobin A1C: 6.4 % — AB (ref 4.0–5.6)

## 2020-08-19 NOTE — Progress Notes (Signed)
Subjective:    Patient ID: Angel Frazier, male    DOB: 07/05/1955, 65 y.o.   MRN: [id number]  HPI Pt returns for f/u of diabetes mellitus: DM type: 1, in remission (AKA atypical DM, intermittently ketosis-prone DM, etc).   Dx'ed: 6269 Complications: none.   Therapy: 2 oral meds.    DKA: once, at dx Severe hypoglycemia: never. Pancreatitis: never Pancreatic imaging: normal on 2017 CT SDOH: he could not afford Rybelsus Other: he has been in remission since 2017. He took insulin for a brief time after dx; edema limits rx options.   Interval history: pt says cbg's are approx 100.  He takes meds as rx'ed.  pt states he feels well in general. Past Medical History:  Diagnosis Date  .  vitamin D deficiency 01/24/2013   01/23/13 - level 29 - supplements 50k IU weekly x 6 recommended   . Anabolic steroid abuse    PT. DENIES 03/30/19  . Arthritis   . Avascular necrosis of femoral head (HCC)    bilateral  . Bleeding hemorrhoids   . Diabetes mellitus without complication ([place]) 4854   type 2  . Drug-induced acute pancreatitis - 6MP 09/07/2015  . Drug-induced pancreatitis    6 MP  . Erectile dysfunction   . Hypertension   . Left sided ulcerative colitis ([place])   . Psoriasis caused by Humira 04/13/2019  . Rhabdomyolysis 12/25/2018  . Spinal stenosis of lumbar region at multiple levels     Past Surgical History:  Procedure Laterality Date  . COLONOSCOPY     multiple  . KNEE ARTHROSCOPY Right   . SIGMOIDOSCOPY      Social History   Socioeconomic History  . Marital status: Single    Spouse name: Not on file  . Number of children: 1  . Years of education: Not on file  . Highest education level: Not on file  Occupational History  . Occupation: retired    Fish farm manager: [employer]  Tobacco Use  . Smoking status: Former Smoker    Packs/day: 1.00    Years: 6.00    Pack years: 6.00    Types: Cigarettes    Quit date: 06/04/2000    Years since quitting: 20.2  . Smokeless  tobacco: Never Used  Vaping Use  . Vaping Use: Never used  Substance and Sexual Activity  . Alcohol use: Yes    Comment: occasionally  . Drug use: No  . Sexual activity: Not on file  Other Topics Concern  . Not on file  Social History Narrative   Single never married. 1 son 19 in 2018. 1 grandson 16 in 2018. 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 Difficulty of Paying Living Expenses: Not hard at all  Food Insecurity: No Food Insecurity  . Worried About Charity fundraiser in the Last Year: Never true  . Ran Out of Food in the Last Year: Never true  Transportation Needs: No Transportation Needs  . Lack of Transportation (Medical): No  . Lack of Transportation (Non-Medical): No  Physical Activity: Inactive  . Days of Exercise per Week: 0 days  . Minutes of Exercise per Session: 0 min  Stress: No Stress Concern Present  . Feeling  of Stress : Not at all  Social Connections: Moderately Isolated  . Frequency of Communication with Friends and Family: More than three times a week  . Frequency of Social Gatherings with Friends and Family: Once a week  . Attends Religious Services: 1 to 4 times per year  . Active Member of Clubs or Organizations: No  . Attends Archivist Meetings: Never  . Marital Status: Never married  Intimate Partner Violence: Not At Risk  . Fear of Current or Ex-Partner: No  . Emotionally Abused: No  . Physically Abused: No  . Sexually Abused: No    Current Outpatient Medications on File Prior to Visit  Medication Sig Dispense Refill  . Accu-Chek Softclix Lancets lancets Use to check sugar before meals 3 times daily 300 each 0  . Alcohol Swabs (B-D SINGLE USE SWABS REGULAR) PADS USE AS DIRECTED OR NEEDED 300 each 12  . amLODipine (NORVASC) 5 MG tablet  Take 1 tablet (5 mg total) by mouth at bedtime. 90 tablet 3  . Ascorbic Acid (VITAMIN C) 1000 MG tablet Take 1,000 mg by mouth daily. Reported on 09/28/2015    . aspirin 81 MG tablet Take 81 mg by mouth daily. Reported on 09/28/2015    . atorvastatin (LIPITOR) 40 MG tablet Take 1 tablet (40 mg total) by mouth once a week. 13 tablet 3  . Blood Glucose Calibration (TRUE METRIX LEVEL 1) Low SOLN Use as directed 1 each 1  . blood glucose meter kit and supplies Dispense based on patient and insurance preference. Use up to four times daily as directed. (FOR ICD-9 250.00, 250.01). 1 each 0  . Blood Glucose Monitoring Suppl (TRUE METRIX METER) w/Device KIT Use daily as directed needed 1 kit 1  . Calcium Carbonate-Vitamin D (CALCIUM-VITAMIN D3 PO) Take 1 tablet by mouth daily. Reported on 09/28/2015    . Cholecalciferol (VITAMIN D) 50 MCG (2000 UT) CAPS Take 1 capsule by mouth daily.    . Cyanocobalamin (B-12 PO) 501m daily    . glucose blood (TRUE METRIX BLOOD GLUCOSE TEST) test strip Use as instructed to check blood sugar before meals. 300 each 12  . mesalamine (LIALDA) 1.2 g EC tablet Take 2 tablets (2.4 g total) by mouth 2 (two) times daily. 360 tablet 3  . metFORMIN (GLUCOPHAGE) 1000 MG tablet TAKE 1 TABLET(1000 MG) BY MOUTH TWICE DAILY WITH A MEAL 180 tablet 3  . Multiple Vitamins-Minerals (CENTRUM SILVER PO) Take 1 tablet by mouth daily. Reported on 09/28/2015    . Omega-3 Fatty Acids (FISH OIL) 300 MG CAPS Take 300 mg by mouth daily.    . repaglinide (PRANDIN) 0.5 MG tablet Take 0.5 tablets (0.25 mg total) by mouth daily with supper. 30 tablet 1   No current facility-administered medications on file prior to visit.    Allergies  Allergen Reactions  . Humira [Adalimumab] Rash    psoriasis  . Mercaptopurine Other (See Comments)    Caused Pancreatitis    Family History  Problem Relation Age of Onset  . Diabetes Mother   . Breast cancer Sister   . Diabetes Other        cousin  .  Hypertension Other   . Diabetes type I Son   . Colon cancer Neg Hx   . Esophageal cancer Neg Hx   . Rectal cancer Neg Hx   . Stomach cancer Neg Hx   . Colon polyps Neg Hx     BP 130/80 (BP Location: Right Arm, Patient  Position: Sitting, Cuff Size: Large)   Pulse 79   Ht _0  (1.803 m)   Wt 254 lb 3.2 oz (115.3 kg)   SpO2 97%   BMI 35.45 kg/m    Review of Systems     Objective:   Physical Exam VITAL SIGNS:  See vs page GENERAL: no distress Pulses: dorsalis pedis intact bilat.   MSK: no deformity of the feet CV: trace bilat leg edema Skin:  no ulcer on the feet.  normal color and temp on the feet. Neuro: sensation is intact to touch on the feet.   A1c=6.4%    Assessment & Plan:  HTN: is noted today Type 2 DM: well-controlled  Patient Instructions  Your blood pressure is high today.  Please see your primary care provider soon, to have it rechecked.   check your blood sugar once a day.  vary the time of day when you check, between before the 3 meals, and at bedtime.  also check if you have symptoms of your blood sugar being too high or too low.  please keep a record of the readings and bring it to your next appointment here (or you can bring the meter itself).  You can write it on any piece of paper.  please call us sooner if your blood sugar goes below 70, or if you have a lot of readings over 200.   Please continue the same metformin and repaglinide.   Please come back for a follow-up appointment in 6 months.

## 2020-08-19 NOTE — Patient Instructions (Addendum)
Your blood pressure is high today.  Please see your primary care provider soon, to have it rechecked.   check your blood sugar once a day.  vary the time of day when you check, between before the 3 meals, and at bedtime.  also check if you have symptoms of your blood sugar being too high or too low.  please keep a record of the readings and bring it to your next appointment here (or you can bring the meter itself).  You can write it on any piece of paper.  please call us sooner if your blood sugar goes below 70, or if you have a lot of readings over 200.   Please continue the same metformin and repaglinide.   Please come back for a follow-up appointment in 6 months.

## 2020-09-07 DIAGNOSIS — R9349 Abnormal radiologic findings on diagnostic imaging of other urinary organs: Secondary | ICD-10-CM | POA: Diagnosis not present

## 2020-09-07 DIAGNOSIS — N429 Disorder of prostate, unspecified: Secondary | ICD-10-CM | POA: Diagnosis not present

## 2020-09-07 DIAGNOSIS — R972 Elevated prostate specific antigen [PSA]: Secondary | ICD-10-CM | POA: Diagnosis not present

## 2020-09-08 ENCOUNTER — Ambulatory Visit: Payer: Medicare HMO | Admitting: Internal Medicine

## 2020-09-19 ENCOUNTER — Telehealth: Payer: Self-pay | Admitting: Internal Medicine

## 2020-09-19 NOTE — Telephone Encounter (Signed)
Inbound call from patient requesting a call from a nurse please.  States he is having an ulcerative colitis flare-up and would like to know what to do.  Please advise.

## 2020-09-20 NOTE — Telephone Encounter (Signed)
Patient reports flare for the last week of urgent diarrhea.  Approximately 5 episodes a day.  He is having some very minor bleeding, but attributes it to a recent prostate biopsy.  He is maintained on lialda 4800 mg daily.  Patient will come in tomorrow for an office visit with Dr. Carlean Purl

## 2020-09-20 NOTE — Telephone Encounter (Signed)
Left message for patient to call back  

## 2020-09-21 ENCOUNTER — Ambulatory Visit: Payer: Medicare HMO | Admitting: Internal Medicine

## 2020-09-21 ENCOUNTER — Encounter: Payer: Self-pay | Admitting: Internal Medicine

## 2020-09-21 ENCOUNTER — Other Ambulatory Visit: Payer: Self-pay

## 2020-09-21 VITALS — BP 130/86 | HR 87 | Ht 71.0 in | Wt 252.0 lb

## 2020-09-21 DIAGNOSIS — K5150 Left sided colitis without complications: Secondary | ICD-10-CM | POA: Diagnosis not present

## 2020-09-21 MED ORDER — PREDNISONE 10 MG PO TABS: ORAL_TABLET | ORAL | 0 refills | Status: AC

## 2020-09-21 NOTE — Progress Notes (Signed)
Angel Frazier 65 y.o. Oct 03, 1955 [id number]  Assessment & Plan:   LEFT SIDED ULCERATIVE COLITIS It sounds like he is flaring again.  Looking over things I am not convinced he needs to have a colonoscopy a year after the last 1.  To be honest I am not sure why put that in I think it was because he had had all the trouble with Humira etc. and I did want to lose track of him.  I think we can wait.  Change that to 2 years from the last 1 and consider later this year.  In the meantime he is going to need a prednisone taper which I have initiated.  He will return to see me in June for follow-up.  We will discuss further evaluation and other treatment to try to maintain remission without steroids.     Subjective:   Chief Complaint: Colitis flare  HPI Angel Frazier is a 65 year old African-American man with longstanding left-sided ulcerative colitis last seen in August 2021, he has a history of treatment with Humira but that caused psoriasis and mercaptopurine caused pancreatitis.  He has had multiple steroid treatments over the years more recently he has back on Lialda 2.4 g twice daily and had been well.  He had a prostate biopsy after a PET scan for pulmonary nodules suggested abnormal prostate a few weeks ago, he had some rectal bleeding after that and he is also been having diarrhea.  The bleeding stopped in the past few days but he is having 4-5 stools a day and is concerned about urgency etc.  I had placed him in a colonoscopy recall for a year and he would have been due in November and Dr. Yong Channel was reminding him he is due for a colonoscopy.  No abdominal pain no fevers reported.  Last colonoscopy was in November 2020 and demonstrated some distal mild colitis activity and his hemorrhoids.  CBC Latest Ref Rng & Units 08/09/2020 12/08/2019 09/17/2019  WBC 4.0 - 10.5 K/uL 4.4 8.1 5.0  Hemoglobin 13.0 - 17.0 g/dL 13.4 13.3 13.7  Hematocrit 39.0 - 52.0 % 39.6 39.9 41.2  Platelets 150.0 - 400.0 K/uL 219.0  250.0 225.0      Chemistry      Component Value Date/Time   NA 140 08/09/2020 0839   K 3.6 08/09/2020 0839   CL 104 08/09/2020 0839   CO2 25 08/09/2020 0839   BUN 14 08/09/2020 0839   CREATININE 0.82 08/09/2020 0839      Component Value Date/Time   CALCIUM 9.2 08/09/2020 0839   ALKPHOS 39 08/09/2020 0839   AST 36 08/09/2020 0839   ALT 26 08/09/2020 0839   BILITOT 0.7 08/09/2020 0839      Allergies  Allergen Reactions  . Humira [Adalimumab] Rash    psoriasis  . Mercaptopurine Other (See Comments)    Caused Pancreatitis   Current Meds  Medication Sig  . Accu-Chek Softclix Lancets lancets Use to check sugar before meals 3 times daily  . Alcohol Swabs (B-D SINGLE USE SWABS REGULAR) PADS USE AS DIRECTED OR NEEDED  . amLODipine (NORVASC) 5 MG tablet Take 1 tablet (5 mg total) by mouth at bedtime.  . Ascorbic Acid (VITAMIN C) 1000 MG tablet Take 1,000 mg by mouth daily. Reported on 09/28/2015  . aspirin 81 MG tablet Take 81 mg by mouth daily. Reported on 09/28/2015  . atorvastatin (LIPITOR) 40 MG tablet Take 1 tablet (40 mg total) by mouth once a week.  . Blood Glucose  Calibration (TRUE METRIX LEVEL 1) Low SOLN Use as directed  . blood glucose meter kit and supplies Dispense based on patient and insurance preference. Use up to four times daily as directed. (FOR ICD-9 250.00, 250.01).  . Blood Glucose Monitoring Suppl (TRUE METRIX METER) w/Device KIT Use daily as directed needed  . Calcium Carbonate-Vitamin D (CALCIUM-VITAMIN D3 PO) Take 1 tablet by mouth daily. Reported on 09/28/2015  . Cholecalciferol (VITAMIN D) 50 MCG (2000 UT) CAPS Take 1 capsule by mouth daily.  . Cyanocobalamin (B-12 PO) 5068m daily  . glucose blood (TRUE METRIX BLOOD GLUCOSE TEST) test strip Use as instructed to check blood sugar before meals.  . mesalamine (LIALDA) 1.2 g EC tablet Take 2 tablets (2.4 g total) by mouth 2 (two) times daily.  . metFORMIN (GLUCOPHAGE) 1000 MG tablet TAKE 1 TABLET(1000 MG) BY  MOUTH TWICE DAILY WITH A MEAL  . Multiple Vitamins-Minerals (CENTRUM SILVER PO) Take 1 tablet by mouth daily. Reported on 09/28/2015  . Omega-3 Fatty Acids (FISH OIL) 300 MG CAPS Take 300 mg by mouth daily.  . repaglinide (PRANDIN) 0.5 MG tablet Take 0.5 tablets (0.25 mg total) by mouth daily with supper.   Past Medical History:  Diagnosis Date  .  vitamin D deficiency 01/24/2013   01/23/13 - level 29 - supplements 50k IU weekly x 6 recommended   . Anabolic steroid abuse    PT. DENIES 03/30/19  . Arthritis   . Avascular necrosis of femoral head (HCC)    bilateral  . Bleeding hemorrhoids   . Diabetes mellitus [place] complication ([place] [address]  type 2  . Drug-induced acute pancreatitis - 6MP 09/07/2015  . Drug-induced pancreatitis    6 MP  . Erectile dysfunction   . Hypertension   . [place]   . Psoriasis caused by Humira 04/13/2019  . Rhabdomyolysis 12/25/2018  . Spinal stenosis of lumbar region at multiple levels    Past Surgical History:  Procedure Laterality Date  . COLONOSCOPY     multiple  . KNEE ARTHROSCOPY Right   . SIGMOIDOSCOPY     Social History   Social History Narrative   Single never married. 1 son 438in 2018. 1 grandson 162in 2018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   Review of Systems As above  Objective:   Physical Exam BP 130/86   Pulse 87   Ht _0  (1.803 m)   Wt 252 lb (114.3 kg)   SpO2 97%   BMI 35.15 kg/m  Pleasant obese black man no acute distress Abdomen is soft obese nontender no organomegaly or mass\  Data reviewed see HPI

## 2020-09-21 NOTE — Assessment & Plan Note (Signed)
It sounds like he is flaring again.  Looking over things I am not convinced he needs to have a colonoscopy a year after the last 1.  To be honest I am not sure why put that in I think it was because he had had all the trouble with Humira etc. and I did want to lose track of him.  I think we can wait.  Change that to 2 years from the last 1 and consider later this year.  In the meantime he is going to need a prednisone taper which I have initiated.  He will return to see me in June for follow-up.  We will discuss further evaluation and other treatment to try to maintain remission without steroids.

## 2020-09-21 NOTE — Patient Instructions (Addendum)
If you are age 67 or older, your body mass index should be between 23-30. Your Body mass index is 35.15 kg/m. If this is out of the aforementioned range listed, please consider follow up with your Primary Care Provider.  We have scheduled you a follow up with Dr. Carlean Purl on 11/04/20 at 3:30pm.  MEDICATION: We have sent the following medication to your pharmacy for you to pick up at your convenience: Prednisone taper, follow instructions on the bottle.  Please call our office if your symptoms worsen.  It was great seeing you today! Thank you for entrusting me with your care and choosing [facility].  Dr. Carlean Purl

## 2020-10-01 ENCOUNTER — Other Ambulatory Visit: Payer: Self-pay | Admitting: Family Medicine

## 2020-10-07 ENCOUNTER — Encounter: Payer: Self-pay | Admitting: Family Medicine

## 2020-11-04 ENCOUNTER — Ambulatory Visit: Payer: Medicare HMO | Admitting: Internal Medicine

## 2020-11-04 ENCOUNTER — Encounter: Payer: Self-pay | Admitting: Internal Medicine

## 2020-11-04 VITALS — BP 116/72 | HR 92 | Ht 71.0 in | Wt 252.8 lb

## 2020-11-04 DIAGNOSIS — K5150 Left sided colitis without complications: Secondary | ICD-10-CM | POA: Diagnosis not present

## 2020-11-04 NOTE — Progress Notes (Signed)
Angel Frazier 65 y.o. December 04, 1955 [id number]  Assessment & Plan:   LEFT SIDED ULCERATIVE COLITIS Continue mesalamine.  4.8 g daily.  Biologics are not an option due to cost as best we know.  We had looked into Entyvio before and it was cost prohibitive.  Intolerant to immunomodulators.   We both understand the need to try to limit steroids but prednisone rescue is about the best option we have right now.  Surveillance colonoscopy will be scheduled for August or September     I appreciate the opportunity to care for this patient. CC: [name], MD  Subjective:   Chief Complaint: Follow-up of ulcerative colitis  HPI Coreon returns for follow-up he has left-sided ulcerative colitis he was having a bit of a flare in April after he had had a prostate biopsy.  There are significant cost issues and side effect issues with Biologics and 6-MP is.  He feels well at this time the flare was short-lived treated with prednisone again.  He wonders if there is something in his diet that is triggering his problems but he does seem to eat fairly clean with whole foods not processed foods etc.  His sister died of pancreatic cancer in 06/29/2022 he wonders if that was a stressor triggering his flare we also talked about the possibility of his prostate biopsy. Allergies  Allergen Reactions  . Humira [Adalimumab] Rash    psoriasis  . Mercaptopurine Other (See Comments)    Caused Pancreatitis   Current Meds  Medication Sig  . Accu-Chek Softclix Lancets lancets Use to check sugar before meals 3 times daily  . Alcohol Swabs (B-D SINGLE USE SWABS REGULAR) PADS USE AS DIRECTED OR NEEDED  . amLODipine (NORVASC) 5 MG tablet Take 1 tablet (5 mg total) by mouth at bedtime.  . Ascorbic Acid (VITAMIN C) 1000 MG tablet Take 1,000 mg by mouth daily. Reported on 09/28/2015  . aspirin 81 MG tablet Take 81 mg by mouth daily. Reported on 09/28/2015  . atorvastatin (LIPITOR) 40 MG tablet Take 1 tablet (40 mg  total) by mouth once a week.  . Blood Glucose Calibration (TRUE METRIX LEVEL 1) Low SOLN Use as directed  . blood glucose meter kit and supplies Dispense based on patient and insurance preference. Use up to four times daily as directed. (FOR ICD-9 250.00, 250.01).  . Blood Glucose Monitoring Suppl (TRUE METRIX METER) w/Device KIT Use daily as directed needed  . Calcium Carbonate-Vitamin D (CALCIUM-VITAMIN D3 PO) Take 1 tablet by mouth daily. Reported on 09/28/2015  . Cholecalciferol (VITAMIN D) 50 MCG (2000 UT) CAPS Take 1 capsule by mouth daily.  . Cyanocobalamin (B-12 PO) 5089m daily  . glucose blood (TRUE METRIX BLOOD GLUCOSE TEST) test strip Use as instructed to check blood sugar before meals.  . mesalamine (LIALDA) 1.2 g EC tablet Take 2 tablets (2.4 g total) by mouth 2 (two) times daily.  . metFORMIN (GLUCOPHAGE) 1000 MG tablet TAKE 1 TABLET(1000 MG) BY MOUTH TWICE DAILY WITH A MEAL  . Multiple Vitamins-Minerals (CENTRUM SILVER PO) Take 1 tablet by mouth daily. Reported on 09/28/2015  . Omega-3 Fatty Acids (FISH OIL) 300 MG CAPS Take 300 mg by mouth daily.  . repaglinide (PRANDIN) 0.5 MG tablet Take 0.5 tablets (0.25 mg total) by mouth daily with supper.   Past Medical History:  Diagnosis Date  .  vitamin D deficiency 01/24/2013   01/23/13 - level 29 - supplements 50k IU weekly x 6 recommended   . Anabolic  steroid abuse    PT. DENIES 03/30/19  . Arthritis   . Avascular necrosis of femoral head (HCC)    bilateral  . Bleeding hemorrhoids   . Diabetes mellitus without complication ([place]) 2993   type 2  . Drug-induced acute pancreatitis - 6MP 09/07/2015  . Erectile dysfunction   . Hypertension   . Left sided ulcerative colitis ([place])   . Psoriasis caused by Humira 04/13/2019  . Rhabdomyolysis 12/25/2018  . Spinal stenosis of lumbar region at multiple levels    Past Surgical History:  Procedure Laterality Date  . COLONOSCOPY     multiple  . KNEE ARTHROSCOPY Right   . PROSTATE BIOPSY   08/2020   Negative for cancer  . SIGMOIDOSCOPY     Social History   Social History Narrative   Single never married. 1 son 71 in 2018. 1 grandson 45 in 2018. 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   Review of Systems As above  Objective:   Physical Exam BP 116/72   Pulse 92   Ht _0  (1.803 m)   Wt 252 lb 12.8 oz (114.7 kg)   SpO2 97%   BMI 35.26 kg/m  No acute distress

## 2020-11-04 NOTE — Assessment & Plan Note (Signed)
Continue mesalamine.  4.8 g daily.  Biologics are not an option due to cost as best we know.  We had looked into Entyvio before and it was cost prohibitive.  Intolerant to immunomodulators.   We both understand the need to try to limit steroids but prednisone rescue is about the best option we have right now.  Surveillance colonoscopy will be scheduled for August or September

## 2020-11-04 NOTE — Patient Instructions (Signed)
You have been scheduled for a colonoscopy. Please follow written instructions given to you at your visit today.  Please pick up your prep supplies at the pharmacy within the next 1-3 days. If you use inhalers (even only as needed), please bring them with you on the day of your procedure.  If you are age 67 or older, your body mass index should be between 23-30. Your Body mass index is 35.26 kg/m. If this is out of the aforementioned range listed, please consider follow up with your Primary Care Provider.  [facility] providers would like to encourage you to use [facility] to communicate with providers for non-urgent requests or questions.  Due to long hold times on the telephone, sending your provider a message by [facility] may be a faster and more efficient way to get a response.  Please allow 48 business hours for a response.  Please remember that this is for non-urgent requests.    I appreciate the opportunity to care for you. Silvano Rusk, MD, [facility]

## 2020-11-08 ENCOUNTER — Other Ambulatory Visit: Payer: Self-pay | Admitting: Endocrinology

## 2020-11-16 ENCOUNTER — Ambulatory Visit: Payer: Medicare HMO | Admitting: Internal Medicine

## 2020-11-16 ENCOUNTER — Ambulatory Visit (INDEPENDENT_AMBULATORY_CARE_PROVIDER_SITE_OTHER): Payer: Medicare HMO | Admitting: Adult Health

## 2020-11-16 ENCOUNTER — Ambulatory Visit (INDEPENDENT_AMBULATORY_CARE_PROVIDER_SITE_OTHER): Payer: Medicare HMO

## 2020-11-16 ENCOUNTER — Other Ambulatory Visit: Payer: Self-pay

## 2020-11-16 ENCOUNTER — Encounter: Payer: Self-pay | Admitting: Adult Health

## 2020-11-16 VITALS — BP 126/74 | HR 89 | Temp 97.9°F | Ht 71.0 in | Wt 253.0 lb

## 2020-11-16 DIAGNOSIS — R918 Other nonspecific abnormal finding of lung field: Secondary | ICD-10-CM | POA: Diagnosis not present

## 2020-11-16 DIAGNOSIS — R0602 Shortness of breath: Secondary | ICD-10-CM | POA: Diagnosis not present

## 2020-11-16 NOTE — Patient Instructions (Signed)
Chest xray today , will call with results.  Activity as tolerated.  Follow up with Dr. Melvyn Novas  in 6 months and As needed

## 2020-11-16 NOTE — Progress Notes (Signed)
@Patient  ID: Angel Frazier, male    DOB: 1955/12/28, 65 y.o.   MRN: [id number]  Chief Complaint  Patient presents with   Follow-up    Denies respiratory problems    Referring provider: [name], MD  HPI: 65 year old male former smoker followed for lung nodules Medical history significant for ulcerative colitis   TEST/EVENTS :  1st detected 2017 on abd ct CT 09/25/15 Multiple bilateral pulmonary nodules, measuring up to 9 mm  - CT 04/24/16 1. Stable numerous (at least 12) solid pulmonary nodules scattered throughout both lungs measuring up to 9 mm in the right lower lobe, for which 6 month stability has been demonstrated, suggesting a benign etiology.  2. Stable apical right upper lobe 1.1 cm ground-glass pulmonary nodule. Stable medial right middle lobe ground-glass and possibly cavitary 1.0 cm pulmonary nodule. Given persistence, repeat CT is recommended every 2 years until 5 years of stability   - CT chest 04/29/2017 1. Scattered solid pulmonary nodules measure 8 mm or less in size, are unchanged from 09/21/2015 and are considered benign. 2. 10 mm ground-glass nodule in the medial segment right middle lobe, stable. Follow-up in 2 years is recommended, as clinically indicated, as a low-grade adenocarcinoma cannot be excluded   - CT 05/04/2019 1. Increased size of 9 mm ground-glass nodule in medial left upper lobe. Recommend continued follow-up by chest CT without contrast in 12 months.  - CT 05/19/2020  C/w progressive met dz > - PET 06/09/2020 Persistent bilateral solid and ground-glass pulmonary nodules show low-grade FDG uptake; low-grade carcinoma cannot be excluded.  No evidence of thoracic lymph node or distant metastatic disease.  Focal FDG uptake in the right prostate peripheral zone, which may be due to high-grade prostate carcinoma or prostatitis  >>> urology referral 06/09/2020 >>>    11/16/2020 Follow up: Lung nodules  Patient returns for a 28-month follow-up.  Patient is followed for multiple bilateral pulmonary nodules that was first detected in 2017 on CT scan.  Patient has been followed serially over the last 5 years.  In 2020 patient had an increased size of a 9 mm nodule in the left upper lobe.  Patient was set up for a PET scan June 09, 2020 that showed persistent bilateral solid and groundglass pulmonary nodules with low-grade FDG uptake.  No evidence of lymph node or distant metastatic disease.  There was focal uptake in the prostate.  And a urology referral was placed. Patient says since last visit he is doing well.  He has no significant symptoms no shortness of breath or cough.  No hemoptysis or unintentional weight loss Patient was set up for a chest x-ray today that showed no apparent nodular opacities on x-ray, better identified on previous CT scan.  He has been seen by urology and says he had a prostate biopsy that was negative earlier this year.   Allergies  Allergen Reactions   Humira [Adalimumab] Rash    psoriasis   Mercaptopurine Other (See Comments)    Caused Pancreatitis    Immunization History  Administered Date(s) Administered   Fluad Quad(high Dose 65+) 03/21/2020   Influenza Split 02/15/2011   Influenza, Seasonal, Injecte, Preservative Fre 05/21/2012   Influenza,inj,Quad PF,6+ Mos 06/03/2013   Moderna Sars-Covid-2 Vaccination 08/24/2019, 09/21/2019, 04/04/2020   Td 06/04/2001   Tdap 05/21/2012    Past Medical History:  Diagnosis Date    vitamin D deficiency 01/24/2013   01/23/13 - level 29 - supplements 50k IU weekly x 6 recommended  Anabolic steroid abuse    PT. DENIES 03/30/19   Arthritis    Avascular necrosis of femoral head (HCC)    bilateral   Bleeding hemorrhoids    Diabetes mellitus without complication ([place]) 1950   type 2   Drug-induced acute pancreatitis - 6MP 09/07/2015   Erectile dysfunction    Hypertension    Left sided ulcerative colitis ([place])    Psoriasis caused by Humira  04/13/2019   Rhabdomyolysis 12/25/2018   Spinal stenosis of lumbar region at multiple levels     Tobacco History: Social History   Tobacco Use  Smoking Status Former   Packs/day: 1.00   Years: 6.00   Pack years: 6.00   Types: Cigarettes   Quit date: 06/04/2000   Years since quitting: 20.4  Smokeless Tobacco Never   Counseling given: Not Answered   Outpatient Medications Prior to Visit  Medication Sig Dispense Refill   Accu-Chek Softclix Lancets lancets Use to check sugar before meals 3 times daily 300 each 0   Alcohol Swabs (B-D SINGLE USE SWABS REGULAR) PADS USE AS DIRECTED OR NEEDED 300 each 12   amLODipine (NORVASC) 5 MG tablet Take 1 tablet (5 mg total) by mouth at bedtime. 90 tablet 3   Ascorbic Acid (VITAMIN C) 1000 MG tablet Take 1,000 mg by mouth daily. Reported on 09/28/2015     aspirin 81 MG tablet Take 81 mg by mouth daily. Reported on 09/28/2015     atorvastatin (LIPITOR) 40 MG tablet Take 1 tablet (40 mg total) by mouth once a week. 13 tablet 3   Blood Glucose Calibration (TRUE METRIX LEVEL 1) Low SOLN Use as directed 1 each 1   blood glucose meter kit and supplies Dispense based on patient and insurance preference. Use up to four times daily as directed. (FOR ICD-9 250.00, 250.01). 1 each 0   Blood Glucose Monitoring Suppl (TRUE METRIX METER) w/Device KIT Use daily as directed needed 1 kit 1   Calcium Carbonate-Vitamin D (CALCIUM-VITAMIN D3 PO) Take 1 tablet by mouth daily. Reported on 09/28/2015     Cholecalciferol (VITAMIN D) 50 MCG (2000 UT) CAPS Take 1 capsule by mouth daily.     Cyanocobalamin (B-12 PO) 5048m daily     glucose blood (TRUE METRIX BLOOD GLUCOSE TEST) test strip Use as instructed to check blood sugar before meals. 300 each 12   mesalamine (LIALDA) 1.2 g EC tablet Take 2 tablets (2.4 g total) by mouth 2 (two) times daily. 360 tablet 3   metFORMIN (GLUCOPHAGE) 1000 MG tablet TAKE 1 TABLET(1000 MG) BY MOUTH TWICE DAILY WITH A MEAL 180 tablet 3   Multiple  Vitamins-Minerals (CENTRUM SILVER PO) Take 1 tablet by mouth daily. Reported on 09/28/2015     Omega-3 Fatty Acids (FISH OIL) 300 MG CAPS Take 300 mg by mouth daily.     repaglinide (PRANDIN) 0.5 MG tablet TAKE 1/2 TABLET(0.25 MG) BY MOUTH DAILY WITH SUPPER 30 tablet 1   No facility-administered medications prior to visit.     Review of Systems:   Constitutional:   No  weight loss, night sweats,  Fevers, chills, fatigue, or  lassitude.  HEENT:   No headaches,  Difficulty swallowing,  Tooth/dental problems, or  Sore throat,                No sneezing, itching, ear ache, nasal congestion, post nasal drip,   CV:  No chest pain,  Orthopnea, PND, swelling in lower extremities, anasarca, dizziness, palpitations, syncope.   GI  No heartburn, indigestion, abdominal pain, nausea, vomiting, diarrhea, change in bowel habits, loss of appetite, bloody stools.   Resp: No shortness of breath with exertion or at rest.  No excess mucus, no productive cough,  No non-productive cough,  No coughing up of blood.  No change in color of mucus.  No wheezing.  No chest wall deformity  Skin: no rash or lesions.  GU: no dysuria, change in color of urine, no urgency or frequency.  No flank pain, no hematuria   MS:  No joint pain or swelling.  No decreased range of motion.  No back pain.    Physical Exam  BP 126/74   Pulse 89   Temp 97.9 F (36.6 C)   Ht 5' 11"  (1.803 m)   Wt 253 lb (114.8 kg)   SpO2 98%   BMI 35.29 kg/m   GEN: A/Ox3; pleasant , NAD, well nourished    HEENT:  [place]/AT,  NOSE-clear, THROAT-clear, no lesions, no postnasal drip or exudate noted.   NECK:  Supple w/ fair ROM; no JVD; normal carotid impulses w/o bruits; no thyromegaly or nodules palpated; no lymphadenopathy.    RESP  Clear  P & A; w/o, wheezes/ rales/ or rhonchi. no accessory muscle use, no dullness to percussion  CARD:  RRR, no m/r/g, no peripheral edema, pulses intact, no cyanosis or clubbing.  GI:   Soft & nt; nml  bowel sounds; no organomegaly or masses detected.   Musco: Warm bil, no deformities or joint swelling noted.   Neuro: alert, no focal deficits noted.    Skin: Warm, no lesions or rashes    Lab Results:      BNP No results found for: BNP  ProBNP No results found for: PROBNP  Imaging: No results found.    No flowsheet data found.  No results found for: NITRICOXIDE      Assessment & Plan:   No problem-specific Assessment & Plan notes found for this encounter.   I spent   30 minutes dedicated to the care of this patient on the date of this encounter to include pre-visit review of records, face-to-face time with the patient discussing conditions above, post visit ordering of testing, clinical documentation with the electronic health record, making appropriate referrals as documented, and communicating necessary findings to members of the patients care team.    Rexene Edison, NP 11/16/2020

## 2020-11-21 ENCOUNTER — Other Ambulatory Visit: Payer: Self-pay | Admitting: *Deleted

## 2020-11-21 DIAGNOSIS — R918 Other nonspecific abnormal finding of lung field: Secondary | ICD-10-CM

## 2020-11-21 NOTE — Progress Notes (Signed)
ATC x2 on home phone, LVM to return call.  Called cell # and was able to speak with patient.  Advised of results/recommendations per Rexene Edison NP.  He verbalized understanding.  CT without contrast ordered for December 2022.  Nothing further needed.

## 2020-11-24 ENCOUNTER — Other Ambulatory Visit: Payer: Self-pay | Admitting: Endocrinology

## 2020-11-28 ENCOUNTER — Telehealth: Payer: Self-pay | Admitting: Internal Medicine

## 2020-11-28 NOTE — Telephone Encounter (Signed)
Patient called states  he is having a flare up from Colitis

## 2020-11-28 NOTE — Telephone Encounter (Signed)
Left message for patient to call back  

## 2020-11-29 NOTE — Telephone Encounter (Signed)
Patient reports that he is having more frequent Bms.  He is feeling better today  Will call me at the end of the week if he is not continuing to improve.

## 2020-12-09 ENCOUNTER — Ambulatory Visit: Payer: Medicare HMO | Admitting: Family Medicine

## 2020-12-16 ENCOUNTER — Encounter: Payer: Self-pay | Admitting: Family Medicine

## 2020-12-16 ENCOUNTER — Other Ambulatory Visit: Payer: Self-pay

## 2020-12-16 ENCOUNTER — Ambulatory Visit (INDEPENDENT_AMBULATORY_CARE_PROVIDER_SITE_OTHER): Payer: Medicare HMO | Admitting: Family Medicine

## 2020-12-16 VITALS — BP 131/85 | HR 69 | Temp 98.0°F | Ht 71.0 in | Wt 242.2 lb

## 2020-12-16 DIAGNOSIS — E785 Hyperlipidemia, unspecified: Secondary | ICD-10-CM | POA: Diagnosis not present

## 2020-12-16 DIAGNOSIS — E1159 Type 2 diabetes mellitus with other circulatory complications: Secondary | ICD-10-CM

## 2020-12-16 DIAGNOSIS — E119 Type 2 diabetes mellitus without complications: Secondary | ICD-10-CM

## 2020-12-16 DIAGNOSIS — E1169 Type 2 diabetes mellitus with other specified complication: Secondary | ICD-10-CM | POA: Diagnosis not present

## 2020-12-16 DIAGNOSIS — I152 Hypertension secondary to endocrine disorders: Secondary | ICD-10-CM

## 2020-12-16 LAB — MICROALBUMIN / CREATININE URINE RATIO
Creatinine,U: 203.4 mg/dL
Microalb Creat Ratio: 3.2 mg/g (ref 0.0–30.0)
Microalb, Ur: 6.5 mg/dL — ABNORMAL HIGH (ref 0.0–1.9)

## 2020-12-16 NOTE — Progress Notes (Signed)
Phone [phone] In person visit   Subjective:   Angel Frazier is a 65 y.o. year old very pleasant male patient who presents for/with See problem oriented charting Chief Complaint  Patient presents with   Hyperlipidemia   Hypertension   Diabetes    This visit occurred during the SARS-CoV-2 public health emergency.  Safety protocols were in place, including screening questions prior to the visit, additional usage of staff PPE, and extensive cleaning of exam room while observing appropriate contact time as indicated for disinfecting solutions.   Past Medical History-  Patient Active Problem List   Diagnosis Date Noted   Diabetes mellitus type II, controlled ([place]) 12/22/2018    Priority: High   LEFT SIDED ULCERATIVE COLITIS 08/11/2008    Priority: High   Hyperlipidemia associated with type 2 diabetes mellitus ([place]) 01/20/2019    Priority: Medium   Hypertension associated with diabetes ([place]) 01/20/2019    Priority: Medium   Pulmonary nodules 09/07/2015    Priority: Medium   Former smoker 09/17/2019    Priority: Low   Long term current use of systemic steroids 11/18/2014    Priority: Low   History of avascular necrosis  11/18/2014    Priority: Low   Vitamin D deficiency 01/24/2013    Priority: Low   ED (erectile dysfunction) 05/21/2012    Priority: Low   Psoriasis caused by Humira 04/13/2019    Medications- reviewed and updated Current Outpatient Medications  Medication Sig Dispense Refill   Alcohol Swabs (B-D SINGLE USE SWABS REGULAR) PADS USE AS DIRECTED OR NEEDED 300 each 12   amLODipine (NORVASC) 5 MG tablet Take 1 tablet (5 mg total) by mouth at bedtime. 90 tablet 3   Ascorbic Acid (VITAMIN C) 1000 MG tablet Take 1,000 mg by mouth daily. Reported on 09/28/2015     aspirin 81 MG tablet Take 81 mg by mouth daily. Reported on 09/28/2015     atorvastatin (LIPITOR) 40 MG tablet Take 1 tablet (40 mg total) by mouth once a week. 13 tablet 3   Blood Glucose Calibration (TRUE  METRIX LEVEL 1) Low SOLN Use as directed 1 each 1   blood glucose meter kit and supplies Dispense based on patient and insurance preference. Use up to four times daily as directed. (FOR ICD-9 250.00, 250.01). 1 each 0   Blood Glucose Monitoring Suppl (TRUE METRIX METER) w/Device KIT USE AS DIRECTED 1 kit 1   Calcium Carbonate-Vitamin D (CALCIUM-VITAMIN D3 PO) Take 1 tablet by mouth daily. Reported on 09/28/2015     Cyanocobalamin (B-12 PO) 5074m daily     glucose blood (TRUE METRIX BLOOD GLUCOSE TEST) test strip Use as instructed to check blood sugar before meals. 300 each 12   mesalamine (LIALDA) 1.2 g EC tablet Take 2 tablets (2.4 g total) by mouth 2 (two) times daily. 360 tablet 3   metFORMIN (GLUCOPHAGE) 1000 MG tablet TAKE 1 TABLET(1000 MG) BY MOUTH TWICE DAILY WITH A MEAL 180 tablet 3   Multiple Vitamins-Minerals (CENTRUM SILVER PO) Take 1 tablet by mouth daily. Reported on 09/28/2015     Omega-3 Fatty Acids (FISH OIL) 300 MG CAPS Take 300 mg by mouth daily.     repaglinide (PRANDIN) 0.5 MG tablet TAKE 1/2 TABLET(0.25 MG) BY MOUTH DAILY WITH SUPPER 30 tablet 1   TRUEplus Lancets 33G MISC USE TO CHECK SUGAR BEFORE MEALS 3 TIMES DAILY 300 each 0   No current facility-administered medications for this visit.     Objective:  BP 131/85  Pulse 69   Temp 98 F (36.7 C) (Temporal)   Ht _0  (1.803 m)   Wt 242 lb 3.2 oz (109.9 kg)   SpO2 99%   BMI 33.78 kg/m  Gen: NAD, resting comfortably CV: RRR no murmurs rubs or gallops Lungs: CTAB no crackles, wheeze, rhonchi Ext: no edema Skin: warm, dry     Assessment and Plan   #Prostate biopsy- thankfully came back negative. He is not sure about follow up at this point- we will await records Lab Results  Component Value Date   PSA 0.76 09/17/2019   PSA 0.90 12/04/2018   PSA 1.17 09/11/2017   #hypertension S: medication: amlodipine 5 mg Home readings #s: rarely cchecks BP Readings from Last 3 Encounters:  12/16/20 131/85   11/16/20 126/74  11/04/20 116/72  A/P: Stable. Continue current medications.    #hyperlipidemia S: Medication:Atorvastatin 40 mg weekly Lab Results  Component Value Date   CHOL 102 08/09/2020   HDL 31.10 (L) 08/09/2020   LDLCALC 58 08/09/2020   LDLDIRECT 64.0 12/08/2019   TRIG 63.0 08/09/2020   CHOLHDL 3 08/09/2020  A/P: well controlled- continue current meds  % Diabetes-follows with endocrinology with A1c typically under 7 S: Medication:Metformin 1000 mg twice daily, repaglinide 0.5 mg with supper Lab Results  Component Value Date   HGBA1C 6.4 (A) 08/19/2020  A/P: well controlled with current meds- continue endocrinoe follow up -I recommended also taking B12 since he is on Metformin- he is on this  #Ulcerative colitis-follows with GI S: Medication: Lialda. Flares occasionally needing prednisone -humira lead to psoriasis A/P: reasonably stable- colonoscopy in September he believes   #Vitamin D deficiency S: Medication: 1000 units daily Last vitamin D Lab Results  Component Value Date   VD25OH 60.19 08/09/2020  A/P: doing well- continue current meds   #Pulmonary nodule follows with Dr. Melvyn Novas -patient has regular follow-up with pulmonology with CT scan typically in November- most recent plan looks like december -Patient ended up needing PET scan June 09, 2020 and ultimately the most dangerous area was noted to be within his prostate and was referred to urology- biopsy benign as above  Recommended follow up: Return in about 6 months (around 06/18/2021) for follow up- or sooner if needed. Future Appointments  Date Time Provider [place]  02/20/2021  9:00 AM Renato Shin, MD [facility] None  03/02/2021  9:00 AM Gatha Mayer, MD [facility]  05/03/2021  1:30 PM [facility] CCM PHARMACIST [facility]  05/18/2021  9:00 AM Tanda Rockers, MD [facility] None  06/12/2021  9:30 AM [facility] HEALTH COACH [facility]   Lab/Order associations:   ICD-10-CM   1.  Hypertension associated with diabetes ([place])  E11.59    I15.2     2. Hyperlipidemia associated with type 2 diabetes mellitus ([place])  E11.69    E78.5     3. Controlled type 2 diabetes mellitus without complication, without long-term current use of insulin (HCC)  E11.9 Microalbumin / creatinine urine ratio      No orders of the defined types were placed in this encounter.   Return precautions advised.  [name], MD

## 2020-12-16 NOTE — Patient Instructions (Addendum)
Health Maintenance Due  Topic Date Due   Zoster Vaccines- Shingrix (1 of 2) Please check with your pharmacy to see if they have the shingrix vaccine. If they do- please get this immunization and update [place] by phone call or mychart with dates you receive the vaccine  Never done   URINE MICROALBUMIN Done today in office.  Please stop by lab before you go If you have mychart- we will send your results within 3 business days of [place] receiving them.  If you do not have mychart- we will call you about results within 5 business days of [place] receiving them.  *please also note that you will see labs on mychart as soon as they post. I will later go in and write notes on them- will say "notes from Dr. Yong Channel"  12/07/2020   Recommended follow up: Return in about 8 months (around 08/16/2021) for physical or sooner if needed.

## 2020-12-28 ENCOUNTER — Telehealth: Payer: Self-pay

## 2020-12-28 NOTE — Chronic Care Management (AMB) (Signed)
Chronic Care Management Pharmacy Assistant   Name: Angel Frazier  MRN: [id number] DOB: 12/14/1955   Reason for Encounter: Disease State - General Adherence     Recent office visits:  12/16/20 [name], MD (PCP) - Hypertension Follow up - Labs ordered. No medication changes. Follow up in 8 months.   08/09/20 [name], MD (PCP) - Preventative Annual Exam - Labs ordered. No medication changes noted. Follow up in 4 months.   Recent consult visits:  11/16/20 Rexene Edison, NP - Pulmonology - Pulmonary Nodules - Chest Xray ordered. Follow up with Dr Melvyn Novas in 6 months.   11/04/20 Barrington Ellison, MD - Gastroenterology - Left sided Ulcerative Colitis - Colonoscopy scheduled. No medication changes. No follow up indicated.   09/21/20 Barrington Ellison, MD - Gastroenterology - Prednisone 10 mg taper started. Follow up as scheduled on 11/04/20.  09/07/20 Irine Seal, MD - Urology - Benign Prostatic Hyperplasia - no notes available.   08/19/20 Renato Shin, MD - Endocrinology - Controlled Type 2 Diabetes - Hgb AIC ordered. Follow up in 6 months.   08/16/20 Christinia Gully, MD - Pulmonary - Pulmonary Nodules -  No medication changes. Follow up in 6 months.  06/22/20 Inda Coke, MD - Optometry - Type 2 Diabetes - No notes available.   06/22/20  Irine Seal, MD - Urology -  Benign Prostatic Hyperplasia - no notes available.   05/20/20 Christinia Gully, MD - Pulmonary - Pulmonary Nodules - PET scan ordered. Follow up post PET scan.   Hospital visits:  None in previous 6 months  Medications: Outpatient Encounter Medications as of 12/28/2020  Medication Sig   Alcohol Swabs (B-D SINGLE USE SWABS REGULAR) PADS USE AS DIRECTED OR NEEDED   amLODipine (NORVASC) 5 MG tablet Take 1 tablet (5 mg total) by mouth at bedtime.   Ascorbic Acid (VITAMIN C) 1000 MG tablet Take 1,000 mg by mouth daily. Reported on 09/28/2015   aspirin 81 MG tablet Take 81 mg by mouth daily. Reported on 09/28/2015   atorvastatin (LIPITOR) 40  MG tablet Take 1 tablet (40 mg total) by mouth once a week.   Blood Glucose Calibration (TRUE METRIX LEVEL 1) Low SOLN Use as directed   blood glucose meter kit and supplies Dispense based on patient and insurance preference. Use up to four times daily as directed. (FOR ICD-9 250.00, 250.01).   Blood Glucose Monitoring Suppl (TRUE METRIX METER) w/Device KIT USE AS DIRECTED   Calcium Carbonate-Vitamin D (CALCIUM-VITAMIN D3 PO) Take 1 tablet by mouth daily. Reported on 09/28/2015   Cyanocobalamin (B-12 PO) 5032m daily   glucose blood (TRUE METRIX BLOOD GLUCOSE TEST) test strip Use as instructed to check blood sugar before meals.   mesalamine (LIALDA) 1.2 g EC tablet Take 2 tablets (2.4 g total) by mouth 2 (two) times daily.   metFORMIN (GLUCOPHAGE) 1000 MG tablet TAKE 1 TABLET(1000 MG) BY MOUTH TWICE DAILY WITH A MEAL   Multiple Vitamins-Minerals (CENTRUM SILVER PO) Take 1 tablet by mouth daily. Reported on 09/28/2015   Omega-3 Fatty Acids (FISH OIL) 300 MG CAPS Take 300 mg by mouth daily.   repaglinide (PRANDIN) 0.5 MG tablet TAKE 1/2 TABLET(0.25 MG) BY MOUTH DAILY WITH SUPPER   TRUEplus Lancets 33G MISC USE TO CHECK SUGAR BEFORE MEALS 3 TIMES DAILY   No facility-administered encounter medications on file as of 12/28/2020.    Have you had any problems recently with your health? Patient denies having any current problems with his health.  Have you  had any problems with your pharmacy? Patient denies having any issues with his current pharmacy.  What issues or side effects are you having with your medications? Patient denies having issues or side effects with his current medications.   What would you like me to pass along to Jacob Potts, CPP for them to help you with?  Patient does not have anything to report at this time. States he is doing well.   What can we do to take care of you better?  Patient does not have any recommendations at this time.    Star Rating Drugs: atorvastatin  (LIPITOR) 40 MG tablet -  patient reports he gets them through [employer] mail order and last filled last month. He is unsure of the exact date.  metFORMIN (GLUCOPHAGE) 1000 MG tablet - patient reports he gets them through [employer] mail order and last filled last month. He is unsure of the exact date.   Liza Showfety, CCMA Clinical Pharmacist Assistant  [phone]  Time Spent: 40  minutes   

## 2021-01-17 ENCOUNTER — Telehealth: Payer: Self-pay

## 2021-01-17 NOTE — Telephone Encounter (Signed)
Called and lm on pt vm tcb. 

## 2021-01-17 NOTE — Telephone Encounter (Signed)
-----   Message from [name], MD sent at 01/16/2021  9:19 PM EDT ----- Team he had a goal to "Goal exercise 8x for at least 15-20 over next month- check in with him at 1 month. "  Can you check in on him and see how he did with his goal ?  Thanks! Annie Main

## 2021-01-18 NOTE — Telephone Encounter (Signed)
Pt returned call and he states he is not motivated to work out right now. He has been working out maybe one day a week, I encouraged pt to try to push himself to 2 days a week if he could.

## 2021-01-27 ENCOUNTER — Telehealth: Payer: Self-pay | Admitting: Pharmacist

## 2021-01-27 NOTE — Chronic Care Management (AMB) (Addendum)
Chronic Care Management Pharmacy Assistant   Name: Angel Frazier  MRN: [id number] DOB: [birth year]   Reason for Encounter: General Adherence Call    Recent office visits:  None  Recent consult visits:  None  Hospital visits:  None in previous 6 months  Medications: Outpatient Encounter Medications as of 01/27/2021  Medication Sig   Alcohol Swabs (B-D SINGLE USE SWABS REGULAR) PADS USE AS DIRECTED OR NEEDED   amLODipine (NORVASC) 5 MG tablet Take 1 tablet (5 mg total) by mouth at bedtime.   Ascorbic Acid (VITAMIN C) 1000 MG tablet Take 1,000 mg by mouth daily. Reported on 09/28/2015   aspirin 81 MG tablet Take 81 mg by mouth daily. Reported on 09/28/2015   atorvastatin (LIPITOR) 40 MG tablet Take 1 tablet (40 mg total) by mouth once a week.   Blood Glucose Calibration (TRUE METRIX LEVEL 1) Low SOLN Use as directed   blood glucose meter kit and supplies Dispense based on patient and insurance preference. Use up to four times daily as directed. (FOR ICD-9 250.00, 250.01).   Blood Glucose Monitoring Suppl (TRUE METRIX METER) w/Device KIT USE AS DIRECTED   Calcium Carbonate-Vitamin D (CALCIUM-VITAMIN D3 PO) Take 1 tablet by mouth daily. Reported on 09/28/2015   Cyanocobalamin (B-12 PO) 502m daily   glucose blood (TRUE METRIX BLOOD GLUCOSE TEST) test strip Use as instructed to check blood sugar before meals.   mesalamine (LIALDA) 1.2 g EC tablet Take 2 tablets (2.4 g total) by mouth 2 (two) times daily.   metFORMIN (GLUCOPHAGE) 1000 MG tablet TAKE 1 TABLET(1000 MG) BY MOUTH TWICE DAILY WITH A MEAL   Multiple Vitamins-Minerals (CENTRUM SILVER PO) Take 1 tablet by mouth daily. Reported on 09/28/2015   Omega-3 Fatty Acids (FISH OIL) 300 MG CAPS Take 300 mg by mouth daily.   repaglinide (PRANDIN) 0.5 MG tablet TAKE 1/2 TABLET(0.25 MG) BY MOUTH DAILY WITH SUPPER   TRUEplus Lancets 33G MISC USE TO CHECK SUGAR BEFORE MEALS 3 TIMES DAILY   No facility-administered encounter medications on  file as of 01/27/2021.   Patient Questions: Have you had any problems recently with your health? Patient states he has not had any problems recently with his health.  Have you had any problems with your pharmacy? Patient has not had any problems recently with his pharmacy.  What issues or side effects are you having with your medications? Patient denies any issues or side effects with any of his medications.  What would you like me to pass along to CLeata Mouse CPP for him to help you with?  Patient states he would like for me to let the pharmacist know that he is trying to get back in shape.  What can we do to take care of you better? Patient did not have any suggestions.  Future Appointments  Date Time Provider DDarby 02/20/2021  9:00 AM ERenato Shin MD [facility] None  03/02/2021  9:00 AM GGatha Mayer MD [facility]o  05/03/2021  1:30 PM [facility] CCM PHARMACIST [facility] PEC  05/18/2021  9:00 AM WTanda Rockers MD [facility] None  06/12/2021  9:30 AM [facility] HEALTH COACH [facility] PEC  08/10/2021  8:20 AM HMarin Olp MD [facility] PEC     Star Rating Drugs: Atorvastatin last filled 11/26/2020 90 DS Metformin last filled 11/28/2020 90 DS  April D Calhoun, CBeaconPharmacist Assistant [phone]  10 minutes spent in review, coordination, and documentation.  Reviewed by: CBeverly Milch PharmD Clinical Pharmacist ([phone]  [phone]

## 2021-02-16 ENCOUNTER — Ambulatory Visit: Payer: Medicare HMO | Admitting: Internal Medicine

## 2021-02-20 ENCOUNTER — Ambulatory Visit (INDEPENDENT_AMBULATORY_CARE_PROVIDER_SITE_OTHER): Payer: Medicare HMO | Admitting: Endocrinology

## 2021-02-20 ENCOUNTER — Telehealth: Payer: Self-pay | Admitting: Internal Medicine

## 2021-02-20 ENCOUNTER — Other Ambulatory Visit: Payer: Self-pay

## 2021-02-20 ENCOUNTER — Other Ambulatory Visit: Payer: Self-pay | Admitting: Internal Medicine

## 2021-02-20 VITALS — BP 142/100 | HR 75 | Ht 71.0 in | Wt 238.6 lb

## 2021-02-20 DIAGNOSIS — E119 Type 2 diabetes mellitus without complications: Secondary | ICD-10-CM

## 2021-02-20 LAB — POCT GLYCOSYLATED HEMOGLOBIN (HGB A1C): Hemoglobin A1C: 5.6 % (ref 4.0–5.6)

## 2021-02-20 MED ORDER — METFORMIN HCL 1000 MG PO TABS: ORAL_TABLET | ORAL | 3 refills | Status: DC

## 2021-02-20 MED ORDER — MESALAMINE 1.2 G PO TBEC: 2.4000 g | DELAYED_RELEASE_TABLET | Freq: Two times a day (BID) | ORAL | 3 refills | Status: DC

## 2021-02-20 NOTE — Telephone Encounter (Signed)
Inbound call from patient. Needs medication refill for Lialda sent to [facility] 3 month supply

## 2021-02-20 NOTE — Telephone Encounter (Signed)
RX sent to requested pharmacy.

## 2021-02-20 NOTE — Patient Instructions (Addendum)
Your blood pressure is high today.  Please see your primary care provider soon, to have it rechecked.   check your blood sugar once a day.  vary the time of day when you check, between before the 3 meals, and at bedtime.  also check if you have symptoms of your blood sugar being too high or too low.  please keep a record of the readings and bring it to your next appointment here (or you can bring the meter itself).  You can write it on any piece of paper.  please call us sooner if your blood sugar goes below 70, or if you have a lot of readings over 200.   Please continue the same metformin and you can stop taking the repaglinide.   Please come back for a follow-up appointment in 6 months.

## 2021-02-20 NOTE — Telephone Encounter (Signed)
Refilled Lialda

## 2021-02-20 NOTE — Progress Notes (Signed)
Subjective:    Patient ID: Angel Frazier, male    DOB: September 01, 1955, 65 y.o.   MRN: [id number]  HPI Pt returns for f/u of diabetes mellitus: DM type: 1, in partial remission (intermittently ketosis-prone).   Dx'ed: 6270 Complications: none.   Therapy: 2 oral meds.    DKA: once, at dx Severe hypoglycemia: never. Pancreatitis: never Pancreatic imaging: normal on 2017 CT SDOH: he could not afford Rybelsus Other: he has been in remission since 2017. He took insulin for a brief time after dx; edema limits rx options.   Interval history: pt says cbg's are in the low-100's.  He takes meds as rx'ed.  pt states he feels well in general.   Past Medical History:  Diagnosis Date    vitamin D deficiency 01/24/2013   01/23/13 - level 29 - supplements 50k IU weekly x 6 recommended    Anabolic steroid abuse    PT. DENIES 03/30/19   Arthritis    Avascular necrosis of femoral head (HCC)    bilateral   Bleeding hemorrhoids    Diabetes mellitus without complication ([place]) 3500   type 2   Drug-induced acute pancreatitis - 6MP 09/07/2015   Erectile dysfunction    Hypertension    Left sided ulcerative colitis ([place])    Psoriasis caused by Humira 04/13/2019   Rhabdomyolysis 12/25/2018   Spinal stenosis of lumbar region at multiple levels     Past Surgical History:  Procedure Laterality Date   COLONOSCOPY     multiple   KNEE ARTHROSCOPY Right    PROSTATE BIOPSY  08/2020   Negative for cancer   SIGMOIDOSCOPY      Social History   Socioeconomic History   Marital status: Single    Spouse name: Not on file   Number of children: 1   Years of education: Not on file   Highest education level: Not on file  Occupational History   Occupation: retired    Fish farm manager: [employer]  Tobacco Use   Smoking status: Former    Packs/day: 1.00    Years: 6.00    Pack years: 6.00    Types: Cigarettes    Quit date: 06/04/2000    Years since quitting: 20.7   Smokeless tobacco: Never  Vaping Use    Vaping Use: Never used  Substance and Sexual Activity   Alcohol use: Yes    Comment: occasionally   Drug use: No   Sexual activity: Not Currently  Other Topics Concern   Not on file  Social History Narrative   Single never married. 1 son 75 in 2018. 1 grandson 29 in 2018. Lives in [place] alone      Disability- low back issues   Roll up machine operator      Hobbies: enjoys tinkering in his shed, enjoys going to the gym   Social Determinants of Health   Financial Resource Strain: Low Risk    Difficulty of Paying Living Expenses: Not hard at all  Food Insecurity: No Food Insecurity   Worried About Charity fundraiser in the Last Year: Never true   Arboriculturist in the Last Year: Never true  Transportation Needs: No Transportation Needs   Lack of Transportation (Medical): No   Lack of Transportation (Non-Medical): No  Physical Activity: Inactive   Days of Exercise per Week: 0 days   Minutes of Exercise per Session: 0 min  Stress: No Stress Concern Present   Feeling of Stress :  Not at all  Social Connections: Moderately Isolated   Frequency of Communication with Friends and Family: More than three times a week   Frequency of Social Gatherings with Friends and Family: Once a week   Attends Religious Services: 1 to 4 times per year   Active Member of [employer] or Organizations: No   Attends Archivist Meetings: Never   Marital Status: Never married  Human resources officer Violence: Not At Risk   Fear of Current or Ex-Partner: No   Emotionally Abused: No   Physically Abused: No   Sexually Abused: No    Current Outpatient Medications on File Prior to Visit  Medication Sig Dispense Refill   Alcohol Swabs (B-D SINGLE USE SWABS REGULAR) PADS USE AS DIRECTED OR NEEDED 300 each 12   amLODipine (NORVASC) 5 MG tablet Take 1 tablet (5 mg total) by mouth at bedtime. 90 tablet 3   Ascorbic Acid (VITAMIN C) 1000 MG tablet Take 1,000 mg by mouth daily. Reported on 09/28/2015      aspirin 81 MG tablet Take 81 mg by mouth daily. Reported on 09/28/2015     atorvastatin (LIPITOR) 40 MG tablet Take 1 tablet (40 mg total) by mouth once a week. 13 tablet 3   Blood Glucose Calibration (TRUE METRIX LEVEL 1) Low SOLN Use as directed 1 each 1   blood glucose meter kit and supplies Dispense based on patient and insurance preference. Use up to four times daily as directed. (FOR ICD-9 250.00, 250.01). 1 each 0   Blood Glucose Monitoring Suppl (TRUE METRIX METER) w/Device KIT USE AS DIRECTED 1 kit 1   Calcium Carbonate-Vitamin D (CALCIUM-VITAMIN D3 PO) Take 1 tablet by mouth daily. Reported on 09/28/2015     Cyanocobalamin (B-12 PO) 5040m daily     glucose blood (TRUE METRIX BLOOD GLUCOSE TEST) test strip Use as instructed to check blood sugar before meals. 300 each 12   Multiple Vitamins-Minerals (CENTRUM SILVER PO) Take 1 tablet by mouth daily. Reported on 09/28/2015     Omega-3 Fatty Acids (FISH OIL) 300 MG CAPS Take 300 mg by mouth daily.     TRUEplus Lancets 33G MISC USE TO CHECK SUGAR BEFORE MEALS 3 TIMES DAILY 300 each 0   No current facility-administered medications on file prior to visit.    Allergies  Allergen Reactions   Humira [Adalimumab] Rash    psoriasis   Mercaptopurine Other (See Comments)    Caused Pancreatitis    Family History  Problem Relation Age of Onset   Diabetes Mother    Breast cancer Sister    Diabetes Other        cousin   Hypertension Other    Diabetes type I Son    Colon cancer Neg Hx    Esophageal cancer Neg Hx    Rectal cancer Neg Hx    Stomach cancer Neg Hx    Colon polyps Neg Hx     BP (!) 142/100 (BP Location: Right Arm, Patient Position: Sitting, Cuff Size: Large)   Pulse 75   Ht 5' 11"  (1.803 m)   Wt 238 lb 9.6 oz (108.2 kg)   SpO2 97%   BMI 33.28 kg/m    Review of Systems He denies hypoglycemia.      Objective:   Physical Exam Pulses: dorsalis pedis intact bilat.   MSK: no deformity of the feet CV: trace bilat leg  edema Skin:  no ulcer on the feet.  normal color and temp on the feet. Neuro:  sensation is intact to touch on the feet.    A1c=5.6%     Assessment & Plan:  DM: overcontrolled.    Patient Instructions  Your blood pressure is high today.  Please see your primary care provider soon, to have it rechecked.   check your blood sugar once a day.  vary the time of day when you check, between before the 3 meals, and at bedtime.  also check if you have symptoms of your blood sugar being too high or too low.  please keep a record of the readings and bring it to your next appointment here (or you can bring the meter itself).  You can write it on any piece of paper.  please call us sooner if your blood sugar goes below 70, or if you have a lot of readings over 200.   Please continue the same metformin and you can stop taking the repaglinide.   Please come back for a follow-up appointment in 6 months.

## 2021-03-02 ENCOUNTER — Encounter: Payer: Self-pay | Admitting: Internal Medicine

## 2021-03-02 ENCOUNTER — Ambulatory Visit (AMBULATORY_SURGERY_CENTER): Payer: Medicare HMO | Admitting: Internal Medicine

## 2021-03-02 ENCOUNTER — Other Ambulatory Visit: Payer: Self-pay

## 2021-03-02 VITALS — BP 114/84 | HR 64 | Temp 97.1°F | Resp 20 | Ht 71.0 in | Wt 238.0 lb

## 2021-03-02 DIAGNOSIS — I10 Essential (primary) hypertension: Secondary | ICD-10-CM | POA: Diagnosis not present

## 2021-03-02 DIAGNOSIS — E119 Type 2 diabetes mellitus without complications: Secondary | ICD-10-CM | POA: Diagnosis not present

## 2021-03-02 DIAGNOSIS — K529 Noninfective gastroenteritis and colitis, unspecified: Secondary | ICD-10-CM | POA: Diagnosis not present

## 2021-03-02 DIAGNOSIS — K5150 Left sided colitis without complications: Secondary | ICD-10-CM

## 2021-03-02 MED ORDER — SODIUM CHLORIDE 0.9 % IV SOLN: 500.0000 mL | Freq: Once | INTRAVENOUS | Status: DC

## 2021-03-02 NOTE — Progress Notes (Signed)
Vss nad transferred to pacu

## 2021-03-02 NOTE — Patient Instructions (Addendum)
The colon is inflamed on the left side. Colitis is not controlled. I took biopsies and will think about what we might do differently. As you know the high prices of medications is a problem.  I appreciate the opportunity to care for you. Gatha Mayer, MD, FACG    YOU HAD AN ENDOSCOPIC PROCEDURE TODAY AT [facility]:   Refer to the procedure report that was given to you for any specific questions about what was found during the examination.  If the procedure report does not answer your questions, please call your gastroenterologist to clarify.  If you requested that your care partner not be given the details of your procedure findings, then the procedure report has been included in a sealed envelope for you to review at your convenience later.  YOU SHOULD EXPECT: Some feelings of bloating in the abdomen. Passage of more gas than usual.  Walking can help get rid of the air that was put into your GI tract during the procedure and reduce the bloating. If you had a lower endoscopy (such as a colonoscopy or flexible sigmoidoscopy) you may notice spotting of blood in your stool or on the toilet paper. If you underwent a bowel prep for your procedure, you may not have a normal bowel movement for a few days.  Please Note:  You might notice some irritation and congestion in your nose or some drainage.  This is from the oxygen used during your procedure.  There is no need for concern and it should clear up in a day or so.  SYMPTOMS TO REPORT IMMEDIATELY:  Following lower endoscopy (colonoscopy or flexible sigmoidoscopy):  Excessive amounts of blood in the stool  Significant tenderness or worsening of abdominal pains  Swelling of the abdomen that is new, acute  Fever of 100F or higher  For urgent or emergent issues, a gastroenterologist can be reached at any hour by calling [phone]. Do not use MyChart messaging for urgent concerns.    DIET:  We do recommend a small meal at  first, but then you may proceed to your regular diet.  Drink plenty of fluids but you should avoid alcoholic beverages for 24 hours.  ACTIVITY:  You should plan to take it easy for the rest of today and you should NOT DRIVE or use heavy machinery until tomorrow (because of the sedation medicines used during the test).    FOLLOW UP: Our staff will call the number listed on your records 48-72 hours following your procedure to check on you and address any questions or concerns that you may have regarding the information given to you following your procedure. If we do not reach you, we will leave a message.  We will attempt to reach you two times.  During this call, we will ask if you have developed any symptoms of COVID 19. If you develop any symptoms (ie: fever, flu-like symptoms, shortness of breath, cough etc.) before then, please call [phone].  If you test positive for Covid 19 in the 2 weeks post procedure, please call and report this information to [place].    If any biopsies were taken you will be contacted by phone or by letter within the next 1-3 weeks.  Please call us at [phone] if you have not heard about the biopsies in 3 weeks.    SIGNATURES/CONFIDENTIALITY: You and/or your care partner have signed paperwork which will be entered into your electronic medical record.  These signatures attest to the fact  that that the information above on your After Visit Summary has been reviewed and is understood.  Full responsibility of the confidentiality of this discharge information lies with you and/or your care-partner.

## 2021-03-02 NOTE — Progress Notes (Signed)
Called to room to assist during endoscopic procedure.  Patient ID and intended procedure confirmed with present staff. Received instructions for my participation in the procedure from the performing physician.  

## 2021-03-02 NOTE — Progress Notes (Signed)
CW vitals.

## 2021-03-02 NOTE — Progress Notes (Signed)
[facility] History and Physical   Primary Care Physician:  [name], MD   Reason for Procedure:   F/u ulcerative colitis  Plan:    colonoscopy     HPI: Angel Frazier is a 65 y.o. male w/ hx left-sided UC here for an exam to evaluate disease status and assess response to therapy.   Past Medical History:  Diagnosis Date    vitamin D deficiency 01/24/2013   01/23/13 - level 29 - supplements 50k IU weekly x 6 recommended    Anabolic steroid abuse    PT. DENIES 03/30/19   Arthritis    Avascular necrosis of femoral head (HCC)    bilateral   Bleeding hemorrhoids    Diabetes mellitus without complication ([place]) 2774   type 2   Drug-induced acute pancreatitis - 6MP 09/07/2015   Erectile dysfunction    Hypertension    Left sided ulcerative colitis ([place])    Psoriasis caused by Humira 04/13/2019   Rhabdomyolysis 12/25/2018   Spinal stenosis of lumbar region at multiple levels     Past Surgical History:  Procedure Laterality Date   COLONOSCOPY     multiple   KNEE ARTHROSCOPY Right    PROSTATE BIOPSY  08/2020   Negative for cancer   SIGMOIDOSCOPY      Prior to Admission medications   Medication Sig Start Date End Date Taking? Authorizing Provider  Alcohol Swabs (B-D SINGLE USE SWABS REGULAR) PADS USE AS DIRECTED OR NEEDED 05/05/20  Yes Renato Shin, MD  amLODipine (NORVASC) 5 MG tablet Take 1 tablet (5 mg total) by mouth at bedtime. 03/22/20  Yes [name], MD  Ascorbic Acid (VITAMIN C) 1000 MG tablet Take 1,000 mg by mouth daily. Reported on 09/28/2015   Yes [provider]  aspirin 81 MG tablet Take 81 mg by mouth daily. Reported on 09/28/2015   Yes [provider]  atorvastatin (LIPITOR) 40 MG tablet Take 1 tablet (40 mg total) by mouth once a week. 03/22/20  Yes [name], MD  Blood Glucose Calibration (TRUE METRIX LEVEL 1) Low SOLN Use as directed 03/21/20  Yes Renato Shin, MD  blood glucose meter kit and supplies Dispense  based on patient and insurance preference. Use up to four times daily as directed. (FOR ICD-9 250.00, 250.01). 04/09/17  Yes [name], MD  Blood Glucose Monitoring Suppl (TRUE METRIX METER) w/Device KIT USE AS DIRECTED 11/25/20  Yes Renato Shin, MD  Calcium Carbonate-Vitamin D (CALCIUM-VITAMIN D3 PO) Take 1 tablet by mouth daily. Reported on 09/28/2015   Yes [provider]  Cyanocobalamin (B-12 PO) 5030m daily   Yes [provider]  glucose blood (TRUE METRIX BLOOD GLUCOSE TEST) test strip Use as instructed to check blood sugar before meals. 04/08/20  Yes ERenato Shin MD  mesalamine (LIALDA) 1.2 g EC tablet Take 2 tablets (2.4 g total) by mouth 2 (two) times daily. 02/20/21  Yes GGatha Mayer MD  metFORMIN (GLUCOPHAGE) 1000 MG tablet TAKE 1 TABLET(1000 MG) BY MOUTH TWICE DAILY WITH A MEAL 02/20/21  Yes ERenato Shin MD  Multiple Vitamins-Minerals (CENTRUM SILVER PO) Take 1 tablet by mouth daily. Reported on 09/28/2015   Yes [provider]  Omega-3 Fatty Acids (FISH OIL) 300 MG CAPS Take 300 mg by mouth daily.   Yes [provider]  TRUEplus Lancets 33G MISC USE TO CHECK SUGAR BEFORE MEALS 3 TIMES DAILY 11/25/20  Yes ERenato Shin MD    Current Outpatient Medications  Medication  Sig Dispense Refill   Alcohol Swabs (B-D SINGLE USE SWABS REGULAR) PADS USE AS DIRECTED OR NEEDED 300 each 12   amLODipine (NORVASC) 5 MG tablet Take 1 tablet (5 mg total) by mouth at bedtime. 90 tablet 3   Ascorbic Acid (VITAMIN C) 1000 MG tablet Take 1,000 mg by mouth daily. Reported on 09/28/2015     aspirin 81 MG tablet Take 81 mg by mouth daily. Reported on 09/28/2015     atorvastatin (LIPITOR) 40 MG tablet Take 1 tablet (40 mg total) by mouth once a week. 13 tablet 3   Blood Glucose Calibration (TRUE METRIX LEVEL 1) Low SOLN Use as directed 1 each 1   blood glucose meter kit and supplies Dispense based on patient and insurance preference. Use up to four times daily as  directed. (FOR ICD-9 250.00, 250.01). 1 each 0   Blood Glucose Monitoring Suppl (TRUE METRIX METER) w/Device KIT USE AS DIRECTED 1 kit 1   Calcium Carbonate-Vitamin D (CALCIUM-VITAMIN D3 PO) Take 1 tablet by mouth daily. Reported on 09/28/2015     Cyanocobalamin (B-12 PO) 5049m daily     glucose blood (TRUE METRIX BLOOD GLUCOSE TEST) test strip Use as instructed to check blood sugar before meals. 300 each 12   mesalamine (LIALDA) 1.2 g EC tablet Take 2 tablets (2.4 g total) by mouth 2 (two) times daily. 360 tablet 3   metFORMIN (GLUCOPHAGE) 1000 MG tablet TAKE 1 TABLET(1000 MG) BY MOUTH TWICE DAILY WITH A MEAL 180 tablet 3   Multiple Vitamins-Minerals (CENTRUM SILVER PO) Take 1 tablet by mouth daily. Reported on 09/28/2015     Omega-3 Fatty Acids (FISH OIL) 300 MG CAPS Take 300 mg by mouth daily.     TRUEplus Lancets 33G MISC USE TO CHECK SUGAR BEFORE MEALS 3 TIMES DAILY 300 each 0   Current Facility-Administered Medications  Medication Dose Route Frequency Provider Last Rate Last Admin   0.9 %  sodium chloride infusion  500 mL Intravenous Once GGatha Mayer MD        Allergies as of 03/02/2021 - Review Complete 03/02/2021  Allergen Reaction Noted   Humira [adalimumab] Rash 04/13/2019   Mercaptopurine Other (See Comments) 09/07/2015    Family History  Problem Relation Age of Onset   Diabetes Mother    Breast cancer Sister    Diabetes Other        cousin   Hypertension Other    Diabetes type I Son    Colon cancer Neg Hx    Esophageal cancer Neg Hx    Rectal cancer Neg Hx    Stomach cancer Neg Hx    Colon polyps Neg Hx     Social History   Socioeconomic History   Marital status: Single    Spouse name: Not on file   Number of children: 1   Years of education: Not on file   Highest education level: Not on file  Occupational History   Occupation: retired    EFish farm manager [employer] Tobacco Use   Smoking status: Former    Packs/day: 1.00    Years: 6.00    Pack  years: 6.00    Types: Cigarettes    Quit date: 06/04/2000    Years since quitting: 20.7   Smokeless tobacco: Never  Vaping Use   Vaping Use: Never used  Substance and Sexual Activity   Alcohol use: Yes    Comment: occasionally   Drug use: No   Sexual activity: Not Currently  Other Topics Concern   Not on file  Social History Narrative   Single never married. 1 son 45 in 2018. 1 grandson 13 in 2018. Lives in [place] alone      Disability- low back issues   Roll up machine operator      Hobbies: enjoys tinkering in his shed, enjoys going to the gym      Review of Systems:  other review of systems negative except as mentioned in the HPI.  Physical Exam: Vital signs BP 127/74   Pulse 72   Temp (!) 97.1 F (36.2 C)   Ht 5' 11"  (1.803 m)   Wt 238 lb (108 kg)   SpO2 98%   BMI 33.19 kg/m   General:   Alert,  Well-developed, well-nourished, pleasant and cooperative in NAD Lungs:  Clear throughout to auscultation.   Heart:  Regular rate and rhythm; no murmurs, clicks, rubs,  or gallops. Abdomen:  Soft, nontender and nondistended. Normal bowel sounds.   Neuro/Psych:  Alert and cooperative. Normal mood and affect. A and O x 3   @Chrsitopher Wik  Simonne Maffucci, MD, [facility] [phone] (pager) 03/02/2021 8:48 AM@

## 2021-03-02 NOTE — Op Note (Signed)
[place] Patient Name: Angel Frazier Procedure Date: 03/02/2021 8:40 AM MRN: [id number] Endoscopist: Gatha Mayer , MD Age: 65 Referring MD:  Date of Birth: 1955/09/04 Gender: Male Account #: [id number] Procedure:                Colonoscopy Indications:              Left-sided chronic ulcerative colitis, Follow-up of                            left-sided chronic ulcerative colitis, Disease                            activity assessment of left-sided chronic                            ulcerative colitis, Assess therapeutic response to                            therapy of left-sided chronic ulcerative colitis Medicines:                Propofol per Anesthesia, Monitored Anesthesia Care Procedure:                Pre-Anesthesia Assessment:                           - Prior to the procedure, a History and Physical                            was performed, and patient medications and                            allergies were reviewed. The patient's tolerance of                            previous anesthesia was also reviewed. The risks                            and benefits of the procedure and the sedation                            options and risks were discussed with the patient.                            All questions were answered, and informed consent                            was obtained. Prior Anticoagulants: The patient has                            taken no previous anticoagulant or antiplatelet                            agents. ASA Grade Assessment: III - A patient with  severe systemic disease. After reviewing the risks                            and benefits, the patient was deemed in                            satisfactory condition to undergo the procedure.                           After obtaining informed consent, the colonoscope                            was passed under direct vision. Throughout the                             procedure, the patient's blood pressure, pulse, and                            oxygen saturations were monitored continuously. The                            CF [id number] #[id number] was introduced through the anus                            and advanced to the the cecum, identified by                            appendiceal orifice and ileocecal valve. The                            colonoscopy was performed without difficulty. The                            patient tolerated the procedure well. The quality                            of the bowel preparation was good. The ileocecal                            valve, appendiceal orifice, and rectum were                            photographed. Scope In: 9:00:09 AM Scope Out: 9:14:39 AM Scope Withdrawal Time: 0 hours 11 minutes 11 seconds  Total Procedure Duration: 0 hours 14 minutes 30 seconds  Findings:                 The perianal and digital rectal examinations were                            normal. Pertinent negatives include normal prostate                            (size, shape, and consistency).  Inflammation characterized by altered vascularity,                            congestion (edema), erosions, erythema, friability,                            granularity, loss of vascularity, mucus and shallow                            ulcerations was found in a continuous and                            circumferential pattern from the rectum to the                            descending colon. The splenic flexure, the                            transverse colon, the hepatic flexure, the                            ascending colon, the cecum, the appendiceal orifice                            and the ileocecal valve were spared. This was                            moderate in severity. Biopsies were taken with a                            cold forceps for histology. Verification of patient                             identification for the specimen was done. Estimated                            blood loss was minimal.                           A banding scar was found in the rectum.                           Biopsies were taken with a cold forceps in the                            transverse colon and in the ascending colon for                            histology. Verification of patient identification                            for the specimen was done. Estimated blood loss was  minimal. Complications:            No immediate complications. Estimated Blood Loss:     Estimated blood loss was minimal. Impression:               - Left-sided ulcerative colitis. Inflammation was                            found from the rectum to the descending colon. This                            was moderate in severity. Biopsied.                           - Biopsies were taken with a cold forceps for                            histology in the transverse colon and in the                            ascending colon. Recommendation:           - Patient has a contact number available for                            emergencies. The signs and symptoms of potential                            delayed complications were discussed with the                            patient. Return to normal activities tomorrow.                            Written discharge instructions were provided to the                            patient.                           - Resume previous diet.                           - Continue present medications.                           - Await pathology results. Disease not controlled                            on mesalamine. Humira caused psoriasis and                            intolerant of immunomodulators. Biologics                            cost-prohibitive so far.                           -  Repeat colonoscopy is recommended. The                            colonoscopy  date will be determined after pathology                            results from today's exam become available for                            review. Gatha Mayer, MD 03/02/2021 9:27:29 AM This report has been signed electronically.

## 2021-03-06 ENCOUNTER — Telehealth: Payer: Self-pay | Admitting: *Deleted

## 2021-03-06 NOTE — Telephone Encounter (Signed)
  Follow up Call-  Call back number 03/02/2021 04/13/2019  Post procedure Call Back phone  # [phone]  Permission to leave phone message Yes Yes  Some recent data might be hidden     Patient questions:  Do you have a fever, pain , or abdominal swelling? No. Pain Score  0 *  Have you tolerated food without any problems? No.  Have you been able to return to your normal activities? Yes.    Do you have any questions about your discharge instructions: Diet   No. Medications  No. Follow up visit  No.  Do you have questions or concerns about your Care? No.  Actions: * If pain score is 4 or above: No action needed, pain <4.

## 2021-03-09 ENCOUNTER — Encounter (HOSPITAL_COMMUNITY): Payer: Self-pay | Admitting: Emergency Medicine

## 2021-03-09 ENCOUNTER — Ambulatory Visit (INDEPENDENT_AMBULATORY_CARE_PROVIDER_SITE_OTHER): Payer: Medicare HMO

## 2021-03-09 ENCOUNTER — Other Ambulatory Visit: Payer: Self-pay

## 2021-03-09 ENCOUNTER — Ambulatory Visit (HOSPITAL_COMMUNITY)
Admission: EM | Admit: 2021-03-09 | Discharge: 2021-03-09 | Disposition: A | Discharge status: Home / Self Care | Payer: Medicare HMO | Attending: Emergency Medicine | Admitting: Emergency Medicine

## 2021-03-09 DIAGNOSIS — S99922A Unspecified injury of left foot, initial encounter: Secondary | ICD-10-CM | POA: Diagnosis not present

## 2021-03-09 DIAGNOSIS — Z23 Encounter for immunization: Secondary | ICD-10-CM

## 2021-03-09 DIAGNOSIS — S91332A Puncture wound without foreign body, left foot, initial encounter: Secondary | ICD-10-CM

## 2021-03-09 DIAGNOSIS — T148XXA Other injury of unspecified body region, initial encounter: Secondary | ICD-10-CM

## 2021-03-09 MED ORDER — TETANUS-DIPHTH-ACELL PERTUSSIS 5-2.5-18.5 LF-MCG/0.5 IM SUSY
0.5000 mL | PREFILLED_SYRINGE | Freq: Once | INTRAMUSCULAR | Status: AC
  Administered 2021-03-09: 0.5 mL via INTRAMUSCULAR

## 2021-03-09 MED ORDER — DOXYCYCLINE HYCLATE 100 MG PO CAPS: 100.0000 mg | ORAL_CAPSULE | Freq: Two times a day (BID) | ORAL | 0 refills | Status: AC

## 2021-03-09 MED ORDER — TETANUS-DIPHTH-ACELL PERTUSSIS 5-2.5-18.5 LF-MCG/0.5 IM SUSY
PREFILLED_SYRINGE | INTRAMUSCULAR | Status: AC
  Filled 2021-03-09: qty 0.5

## 2021-03-09 NOTE — ED Provider Notes (Signed)
[facility]    CSN: [id number] Arrival date & time: 03/09/21  1258      History   Chief Complaint Chief Complaint  Patient presents with   Foot Injury    HPI Angel Frazier is a 65 y.o. male.   Patient here for evaluation of left foot injury.  Reports that he stepped on a nail which went through shoe and into the foot.  Puncture wound with some bleeding noted to bottom of left foot.  Patient does have a history of diabetes.  Unsure of last tetanus shot.  Denies any specific alleviating or aggravating factors.  Denies any fevers, chest pain, shortness of breath, N/V/D, numbness, tingling, weakness, abdominal pain, or headaches.    The history is provided by the patient.  Foot Injury  Past Medical History:  Diagnosis Date    vitamin D deficiency 01/24/2013   01/23/13 - level 29 - supplements 50k IU weekly x 6 recommended    Anabolic steroid abuse    PT. DENIES 03/30/19   Arthritis    Avascular necrosis of femoral head ([place])    bilateral   Bleeding hemorrhoids    Diabetes mellitus without complication ([place]) 0539   type 2   Drug-induced acute pancreatitis - 6MP 09/07/2015   Erectile dysfunction    Hypertension    Left sided ulcerative colitis ([place])    Psoriasis caused by Humira 04/13/2019   Rhabdomyolysis 12/25/2018   Spinal stenosis of lumbar region at multiple levels     Patient Active Problem List   Diagnosis Date Noted   Former smoker 09/17/2019   Psoriasis caused by Humira 04/13/2019   Hyperlipidemia associated with type 2 diabetes mellitus ([place]) 01/20/2019   Hypertension associated with diabetes ([place]) 01/20/2019   Diabetes mellitus type II, controlled ([place]) 12/22/2018   Pulmonary nodules 09/07/2015   Long term current use of systemic steroids 11/18/2014   History of avascular necrosis  11/18/2014   Vitamin D deficiency 01/24/2013   ED (erectile dysfunction) 05/21/2012   LEFT SIDED ULCERATIVE COLITIS 08/11/2008    Past Surgical History:  Procedure  Laterality Date   COLONOSCOPY     multiple   KNEE ARTHROSCOPY Right    PROSTATE BIOPSY  08/2020   Negative for cancer   SIGMOIDOSCOPY         Home Medications    Prior to Admission medications   Medication Sig Start Date End Date Taking? Authorizing Provider  doxycycline (VIBRAMYCIN) 100 MG capsule Take 1 capsule (100 mg total) by mouth 2 (two) times daily for 7 days. 03/09/21 03/16/21 Yes Pearson Forster, NP  Alcohol Swabs (B-D SINGLE USE SWABS REGULAR) PADS USE AS DIRECTED OR NEEDED 05/05/20   Renato Shin, MD  amLODipine (NORVASC) 5 MG tablet Take 1 tablet (5 mg total) by mouth at bedtime. 03/22/20   [name], MD  Ascorbic Acid (VITAMIN C) 1000 MG tablet Take 1,000 mg by mouth daily. Reported on 09/28/2015    [provider]  aspirin 81 MG tablet Take 81 mg by mouth daily. Reported on 09/28/2015    [provider]  atorvastatin (LIPITOR) 40 MG tablet Take 1 tablet (40 mg total) by mouth once a week. 03/22/20   [name], MD  Blood Glucose Calibration (TRUE METRIX LEVEL 1) Low SOLN Use as directed 03/21/20   Renato Shin, MD  blood glucose meter kit and supplies Dispense based on patient and insurance preference. Use up to four times daily as directed. (FOR ICD-9 250.00,  250.01). 04/09/17   [name], MD  Blood Glucose Monitoring Suppl (TRUE METRIX METER) w/Device KIT USE AS DIRECTED 11/25/20   Renato Shin, MD  Calcium Carbonate-Vitamin D (CALCIUM-VITAMIN D3 PO) Take 1 tablet by mouth daily. Reported on 09/28/2015    [provider]  Cyanocobalamin (B-12 PO) 5026m daily    [provider]  glucose blood (TRUE METRIX BLOOD GLUCOSE TEST) test strip Use as instructed to check blood sugar before meals. 04/08/20   ERenato Shin MD  mesalamine (LIALDA) 1.2 g EC tablet Take 2 tablets (2.4 g total) by mouth 2 (two) times daily. 02/20/21   GGatha Mayer MD  metFORMIN (GLUCOPHAGE) 1000 MG tablet TAKE 1 TABLET(1000 MG) BY MOUTH TWICE  DAILY WITH A MEAL 02/20/21   ERenato Shin MD  Multiple Vitamins-Minerals (CENTRUM SILVER PO) Take 1 tablet by mouth daily. Reported on 09/28/2015    [provider]  Omega-3 Fatty Acids (FISH OIL) 300 MG CAPS Take 300 mg by mouth daily.    [provider]  TRUEplus Lancets 33G MISC USE TO CHECK SUGAR BEFORE MEALS 3 TIMES DAILY 11/25/20   ERenato Shin MD    Family History Family History  Problem Relation Age of Onset   Diabetes Mother    Breast cancer Sister    Diabetes Other        cousin   Hypertension Other    Diabetes type I Son    Colon cancer Neg Hx    Esophageal cancer Neg Hx    Rectal cancer Neg Hx    Stomach cancer Neg Hx    Colon polyps Neg Hx     Social History Social History   Tobacco Use   Smoking status: Former    Packs/day: 1.00    Years: 6.00    Pack years: 6.00    Types: Cigarettes    Quit date: 06/04/2000    Years since quitting: 20.7   Smokeless tobacco: Never  Vaping Use   Vaping Use: Never used  Substance Use Topics   Alcohol use: Yes    Comment: occasionally   Drug use: No     Allergies   Humira [adalimumab] and Mercaptopurine   Review of Systems Review of Systems  Skin:  Positive for wound.  All other systems reviewed and are negative.   Physical Exam Triage Vital Signs ED Triage Vitals  Enc Vitals Group     BP 03/09/21 1329 (!) 142/92     Pulse Rate 03/09/21 1329 82     Resp 03/09/21 1329 18     Temp 03/09/21 1329 98.7 F (37.1 C)     Temp Source 03/09/21 1329 Oral     SpO2 03/09/21 1329 95 %     Weight --      Height --      Head Circumference --      Peak Flow --      Pain Score 03/09/21 1328 8     Pain Loc --      Pain Edu? --      Excl. in [place] --    No data found.  Updated Vital Signs BP (!) 142/92 (BP Location: Left Arm)   Pulse 82   Temp 98.7 F (37.1 C) (Oral)   Resp 18   SpO2 95%   Visual Acuity Right Eye Distance:   Left Eye Distance:   Bilateral Distance:    Right Eye Near:    Left Eye Near:    Bilateral Near:  Physical Exam Vitals and nursing note reviewed.  Constitutional:      General: He is not in acute distress.    Appearance: Normal appearance. He is not ill-appearing, toxic-appearing or diaphoretic.  HENT:     Head: Normocephalic and atraumatic.  Eyes:     Conjunctiva/sclera: Conjunctivae normal.  Cardiovascular:     Rate and Rhythm: Normal rate.     Pulses: Normal pulses.  Pulmonary:     Effort: Pulmonary effort is normal.  Abdominal:     General: Abdomen is flat.  Musculoskeletal:        General: Normal range of motion.     Cervical back: Normal range of motion.  Skin:    General: Skin is warm and dry.     Findings: Wound (puncture to bottom of left foot) present.  Neurological:     General: No focal deficit present.     Mental Status: He is alert and oriented to person, place, and time.  Psychiatric:        Mood and Affect: Mood normal.     UC Treatments / Results  Labs (all labs ordered are listed, but only abnormal results are displayed) Labs Reviewed - No data to display  EKG   Radiology DG Foot Complete Left  Result Date: 03/09/2021 CLINICAL DATA:  stepped on a nail EXAM: LEFT FOOT - COMPLETE 3+ VIEW COMPARISON:  None. FINDINGS: There is a tiny cortical defect along the lateral aspect of the fifth metatarsal shaft. No radiopaque foreign body. Vascular calcifications. Mild first MTP arthritis. Dorsal midfoot spurring. IMPRESSION: Tiny cortical defect along the lateral aspect of the fifth metatarsal shaft, could be related to the reported penetrating injury. No radiopaque foreign body. Electronically Signed   By: Maurine Simmering M.D.   On: 03/09/2021 14:47    Procedures Procedures (including critical care time)  Medications Ordered in UC Medications  Tdap (BOOSTRIX) injection 0.5 mL (0.5 mLs Intramuscular Given 03/09/21 1421)    Initial Impression / Assessment and Plan / UC Course  I have reviewed the triage vital signs  and the nursing notes.  Pertinent labs & imaging results that were available during my care of the patient were reviewed by me and considered in my medical decision making (see chart for details).    Assessment negative for red flags or concerns.  Puncture wound and injury to left foot.  X-ray with no acute abnormality or retained foreign bodies.  Puncture wound cleaned in office and dressing applied.  Tdap administered in office.  Patient instructed to clean wound once or twice a day with warm soap and water and apply new dressing.  We will treat with doxycycline twice daily for the next 7 days to help prevent infection as he is diabetic.  Follow-up for any signs of infection.  Follow-up with PCP for reevaluation. Final Clinical Impressions(s) / UC Diagnoses   Final diagnoses:  Puncture wound  Injury of left foot, initial encounter     Discharge Instructions      Take the doxycycline 1 pill twice a day for the next 7 days.  Clean the wound with warm soapy water and change the bandage at least twice a day.    You can take Tylenol and/or ibuprofen as needed for pain relief and fever reduction.  Rest as much as possible for the next few days.  Elevate above your hip/heart when sitting and laying down  Return for reevaluation for any worsening symptoms including worsening redness, swelling, red streaks, or fevers.  Return or go to the Emergency Department if symptoms worsen or do not improve in the next few days.       ED Prescriptions     Medication Sig Dispense Auth. Provider   doxycycline (VIBRAMYCIN) 100 MG capsule Take 1 capsule (100 mg total) by mouth 2 (two) times daily for 7 days. 14 capsule Pearson Forster, NP      PDMP not reviewed this encounter.   Pearson Forster, NP 03/09/21 1513

## 2021-03-09 NOTE — Discharge Instructions (Addendum)
Take the doxycycline 1 pill twice a day for the next 7 days.  Clean the wound with warm soapy water and change the bandage at least twice a day.    You can take Tylenol and/or ibuprofen as needed for pain relief and fever reduction.  Rest as much as possible for the next few days.  Elevate above your hip/heart when sitting and laying down  Return for reevaluation for any worsening symptoms including worsening redness, swelling, red streaks, or fevers.  Return or go to the Emergency Department if symptoms worsen or do not improve in the next few days.

## 2021-03-09 NOTE — ED Triage Notes (Signed)
Pt reports on job stepped on a nail that went through boot and into foot. Reports is a diabetic and unsure when last tetanus.

## 2021-03-13 DIAGNOSIS — N5201 Erectile dysfunction due to arterial insufficiency: Secondary | ICD-10-CM | POA: Diagnosis not present

## 2021-03-13 DIAGNOSIS — R351 Nocturia: Secondary | ICD-10-CM | POA: Diagnosis not present

## 2021-03-13 DIAGNOSIS — N401 Enlarged prostate with lower urinary tract symptoms: Secondary | ICD-10-CM | POA: Diagnosis not present

## 2021-03-14 ENCOUNTER — Other Ambulatory Visit: Payer: Self-pay

## 2021-03-14 NOTE — Progress Notes (Signed)
Can we ckeck benefits investigation for Entyvio with price for him. Dr. Carlean Purl is not sure if this will be affordable for him.

## 2021-03-15 ENCOUNTER — Telehealth: Payer: Self-pay | Admitting: Pharmacy Technician

## 2021-03-15 NOTE — Telephone Encounter (Signed)
Angel Frazier, I faxed forms for ENTYVIO PATIENT ASSISTANCE PROGRAM, please have MD sign and return to me. Will f/u with response from PAP.

## 2021-03-15 NOTE — Telephone Encounter (Signed)
Auth Submission: PENDING  Payer: [employer] MEDICARE Medication & CPT/J Code(s) submitted: Entyvio (Vedolizumab) [id number] Route of submission (phone, fax, portal): COVER MY MEDS Auth type: Buy/Bill Units/visits requested: 8 VISITS Reference number: KEY: [facility] -  CASE ID# [id number]  BIV: REF# [id number] OOP: $3900 - Patient has met $589. DEDUCTIBLE = NONE CO-PAY: [employer]=80% - PATIENT= 20%  Patient may be eligible for ENTYVIO PAP.  Will update once we receive a response.

## 2021-03-15 NOTE — Telephone Encounter (Signed)
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  Not sure what his out of pocket with the 20% will be and if it will be affordable for him.  He and I have not been able to connect for OV yet. I will await his return call.

## 2021-03-16 ENCOUNTER — Telehealth: Payer: Self-pay | Admitting: Internal Medicine

## 2021-03-16 NOTE — Telephone Encounter (Signed)
Results were given to pt along with Dr. Carlean Purl Recommendations. Pt was reminded of OV with Dr. Carlean Purl on 03/31/2021 @ 11:30. Pt stated that Barbera Setters had already called about appointment  Pt verbalized understanding with all questions answered.

## 2021-03-16 NOTE — Telephone Encounter (Signed)
Angel Frazier  has been scheduled for follow up on 10/28.Marland Kitchen  He understands to expect some correspondence from Edie.

## 2021-03-16 NOTE — Telephone Encounter (Signed)
Patient returned your call about path results, please call patient one more time.

## 2021-03-17 NOTE — Telephone Encounter (Signed)
Dr. Carlean Purl,  Auth Follow-up: ENTYVIO - DENIED Payer:  [place] of follow up: portal/phone/fax: [place] Reference number: CASE ID# [id number] KEY: [facility] Medication & CPT/J Code(s) : Entyvio (Vedolizumab) [id number]  Authorization has been DENIED because:  has tried or cannot use Stelara (step therapy requirement).  Would you like to use Stelara???  Please advise.  Kim.

## 2021-03-20 NOTE — Telephone Encounter (Signed)
  Angel Frazier, please see below from Dr. Ulice Bold, Ofilia Neas, MD  You 2 hours ago (8:52 AM)   Yes, we should see if Delsa Grana is an option that he could afford

## 2021-03-20 NOTE — Telephone Encounter (Signed)
Auth Submission: PENDING Angel Frazier)  Payer: [employer] Medication & CPT/J Code(s) submitted: Stelara Infusion (Ustekinumab) J3358 Route of submission (phone, fax, portal): COVER MY MEDS Auth type: Buy/Bill Units/visits requested: 520MG (4 VIALS) Reference number: [id number]   Will update once we receive a response.

## 2021-03-21 ENCOUNTER — Encounter: Payer: Self-pay | Admitting: Internal Medicine

## 2021-03-21 ENCOUNTER — Other Ambulatory Visit (HOSPITAL_COMMUNITY): Payer: Self-pay

## 2021-03-21 ENCOUNTER — Other Ambulatory Visit: Payer: Self-pay | Admitting: Pharmacy Technician

## 2021-03-21 NOTE — Telephone Encounter (Signed)
Dr. Carlean Purl,  Auth Submission: Approved Payer: [employer] Medication & CPT/J Code(s) submitted: Stelara Infusion (Ustekinumab) J3358 Route of submission (phone, fax, portal): COVER MY MEDS Auth type: Buy/Bill Units/visits requested: 4 VIALS (x1 dose) Reference number: KEY: [id number] CASE: [id number] Approval from: 03/20/21 to 06/03/22 at [facility]   Patient will be scheduled as soon as possible.  @Rachael  the patient will be on a maintenance dose of 90 mg SQ q8wks under the pharmacy benefit [facility])

## 2021-03-21 NOTE — Telephone Encounter (Signed)
No additional PA needed through pharmacy benefit. Key: [id number]  Ran test claim, patient's copay for 1 dose of Stelara 50m (56 day supply) is $3755.64. Patient is in the coverage gap.  Patient will need to apply for patient assistance through [employer]-   [email or web address]

## 2021-03-21 NOTE — Telephone Encounter (Signed)
Medication has been approved, but due to cost issues patient will need to apply for patient assistance.  I will start the process of patient assistance.  I will follow-up once we receive a response. Maudie Mercury

## 2021-03-22 NOTE — Telephone Encounter (Addendum)
Fyi note: Spoke with patient in regard to [employer] PAP for STELARA.  He has requested application be sent via mail.  He understands that he will need to provide copies of financial documents, (federal tax return)  complete/sign forms.  Application was sent via mail 03/22/21. (I have also included self addressed envelope in packet for patient to mail back information.) Will f/u once I receive response

## 2021-03-29 ENCOUNTER — Telehealth: Payer: Self-pay | Admitting: Internal Medicine

## 2021-03-29 NOTE — Telephone Encounter (Signed)
Inbound call from patient pharmacy. States Stelara prescription that was sent in is not clear. Request if it could be resent with clear directions for the patient

## 2021-03-30 NOTE — Telephone Encounter (Signed)
Contacted Kim Journalist, newspaper) at [facility] in regard to this Issue. Maudie Mercury stated that she would contact them.

## 2021-03-31 ENCOUNTER — Ambulatory Visit (INDEPENDENT_AMBULATORY_CARE_PROVIDER_SITE_OTHER): Payer: Medicare HMO | Admitting: Internal Medicine

## 2021-03-31 ENCOUNTER — Encounter: Payer: Self-pay | Admitting: Internal Medicine

## 2021-03-31 ENCOUNTER — Other Ambulatory Visit: Payer: Self-pay | Admitting: Internal Medicine

## 2021-03-31 VITALS — BP 130/78 | HR 88 | Ht 71.0 in | Wt 240.0 lb

## 2021-03-31 DIAGNOSIS — K51018 Ulcerative (chronic) pancolitis with other complication: Secondary | ICD-10-CM | POA: Diagnosis not present

## 2021-03-31 MED ORDER — BUDESONIDE ER 9 MG PO TB24: 9.0000 mg | ORAL_TABLET | Freq: Every day | ORAL | 1 refills | Status: DC

## 2021-03-31 NOTE — Patient Instructions (Signed)
We have sent the following medications to your pharmacy for you to pick up at your convenience: Budesonide  We are still waiting to get the approval for your Stelara.  I appreciate the opportunity to care for you. Silvano Rusk, MD, [facility]

## 2021-03-31 NOTE — Progress Notes (Signed)
Angel Frazier 65 y.o. 1955/09/02 [id number]  Assessment & Plan:   Encounter Diagnosis  Name Primary?   Universal ulcerative colitis with other complication ([place]) Yes    He seems to be flaring a bit and we know endoscopic exam recently demonstrated visible uncontrolled left-sided UC and now he has microscopic changes showing colitis in the right colon so he has universal ulcerative colitis.  He is intolerant of many medications or has had side effects etc.  These include adalimumab which caused psoriasis and mercaptopurine which caused pancreatitis.  Affordability of medications beyond what he is on is a challenge and even the Lialda requires financial assistance.   We are exploring the possibility of Stelara using the financial assistance program.  In the meantime we will see if a trial of budesonide 9 mg daily will help his flaring ulcerative colitis.  Follow-up pending determination of treatment.  CC: [name], MD      Subjective:   Chief Complaint:  HPI Angel Frazier is here for follow-up of his ulcerative colitis.  Colonoscopy in September on the 29th demonstrated visible moderate inflammatory changes from the rectum to the descending colon and biopsies in the normal areas of the colon revealed colitis as well.  The right-sided changes are a new finding.  He is having a little bit more diarrhea does not report bleeding.  He is maintained on Lialda 2.4 g twice daily. Allergies  Allergen Reactions   Humira [Adalimumab] Rash    psoriasis   Mercaptopurine Other (See Comments)    Caused Pancreatitis   Current Meds  Medication Sig   Alcohol Swabs (B-D SINGLE USE SWABS REGULAR) PADS USE AS DIRECTED OR NEEDED   amLODipine (NORVASC) 5 MG tablet Take 1 tablet (5 mg total) by mouth at bedtime.   Ascorbic Acid (VITAMIN C) 1000 MG tablet Take 1,000 mg by mouth daily. Reported on 09/28/2015   aspirin 81 MG tablet Take 81 mg by mouth daily. Reported on 09/28/2015   atorvastatin  (LIPITOR) 40 MG tablet Take 1 tablet (40 mg total) by mouth once a week.   Blood Glucose Calibration (TRUE METRIX LEVEL 1) Low SOLN Use as directed   blood glucose meter kit and supplies Dispense based on patient and insurance preference. Use up to four times daily as directed. (FOR ICD-9 250.00, 250.01).   Blood Glucose Monitoring Suppl (TRUE METRIX METER) w/Device KIT USE AS DIRECTED   Calcium Carbonate-Vitamin D (CALCIUM-VITAMIN D3 PO) Take 1 tablet by mouth daily. Reported on 09/28/2015   Cyanocobalamin (B-12 PO) 5018m daily   glucose blood (TRUE METRIX BLOOD GLUCOSE TEST) test strip Use as instructed to check blood sugar before meals.   mesalamine (LIALDA) 1.2 g EC tablet Take 2 tablets (2.4 g total) by mouth 2 (two) times daily.   metFORMIN (GLUCOPHAGE) 1000 MG tablet TAKE 1 TABLET(1000 MG) BY MOUTH TWICE DAILY WITH A MEAL   Multiple Vitamins-Minerals (CENTRUM SILVER PO) Take 1 tablet by mouth daily. Reported on 09/28/2015   Omega-3 Fatty Acids (FISH OIL) 300 MG CAPS Take 300 mg by mouth daily.   TRUEplus Lancets 33G MISC USE TO CHECK SUGAR BEFORE MEALS 3 TIMES DAILY   Past Medical History:  Diagnosis Date    vitamin D deficiency 01/24/2013   01/23/13 - level 29 - supplements 50k IU weekly x 6 recommended    Anabolic steroid abuse    PT. DENIES 03/30/19   Arthritis    Avascular necrosis of femoral head (HCC)    bilateral  Bleeding hemorrhoids    Diabetes mellitus without complication ([place]) 1700   type 2   Drug-induced acute pancreatitis - 6MP 09/07/2015   Erectile dysfunction    Hypertension    Left sided ulcerative colitis ([place])    Psoriasis caused by Humira 04/13/2019   Rhabdomyolysis 12/25/2018   Spinal stenosis of lumbar region at multiple levels    Past Surgical History:  Procedure Laterality Date   COLONOSCOPY     multiple   KNEE ARTHROSCOPY Right    PROSTATE BIOPSY  08/2020   Negative for cancer   SIGMOIDOSCOPY     Social History   Social History Narrative   Single  never married. 1 son 48 in 2018. 1 grandson 20 in 2018. Lives in [place] alone      Disability- low back issues   Roll up machine operator      Hobbies: enjoys tinkering in his shed, enjoys going to the gym   family history includes Breast cancer in his sister; Diabetes in his mother and another family member; Diabetes type I in his son; Hypertension in an other family member.   Review of Systems As above  Objective:   Physical Exam BP 130/78   Pulse 88   Ht 5' 11"  (1.803 m)   Wt 240 lb (108.9 kg)   BMI 33.47 kg/m  No acute distress

## 2021-04-01 ENCOUNTER — Telehealth: Payer: Self-pay

## 2021-04-01 NOTE — Telephone Encounter (Signed)
I did a prior authorization thru [employer] for patients Budesonide 42m ER tablets. Dx:K51.50 UC. This has been approved thru 05/27/21 and [place].

## 2021-04-05 ENCOUNTER — Telehealth: Payer: Self-pay

## 2021-04-05 ENCOUNTER — Other Ambulatory Visit (HOSPITAL_COMMUNITY): Payer: Self-pay

## 2021-04-05 NOTE — Telephone Encounter (Addendum)
F/u. [employer] has denied assistance. Will file a HARDSHIP APPEAL due to patient can not afford co-pay. Phone: [phone] Fax: [phone] Will f/u with response. Angel Frazier

## 2021-04-05 NOTE — Telephone Encounter (Signed)
Left message for pt to call back  °

## 2021-04-05 NOTE — Telephone Encounter (Signed)
-----   Message from Gatha Mayer, MD sent at 04/05/2021  8:43 AM EDT ----- Regarding: Denial of Stelara I have received a message from [facility] patient assistance that Joon was turned down for Stelara patient assistance.  There is an appeal form for him to complete.  Remo Lipps can you please check with him and create a phone note about this to see if he has filled out the appeal form or if he needs any help from [place] to send that in.  Joelene Millin, any help you can provide or advice would be appreciated.  I appreciate all of your assistance with Ramzi and other patients so far.  Gatha Mayer, MD, Marval Regal

## 2021-04-06 ENCOUNTER — Telehealth: Payer: Self-pay

## 2021-04-06 NOTE — Telephone Encounter (Signed)
Left message for pt to call back  °

## 2021-04-06 NOTE — Telephone Encounter (Signed)
-----   Message from Gatha Mayer, MD sent at 04/05/2021  8:43 AM EDT ----- Regarding: Denial of Stelara I have received a message from [facility] patient assistance that Leng was turned down for Stelara patient assistance.  There is an appeal form for him to complete.  Remo Lipps can you please check with him and create a phone note about this to see if he has filled out the appeal form or if he needs any help from [place] to send that in.  Joelene Millin, any help you can provide or advice would be appreciated.  I appreciate all of your assistance with Dionisio and other patients so far.  Gatha Mayer, MD, Marval Regal

## 2021-04-06 NOTE — Telephone Encounter (Signed)
Patient returned call

## 2021-04-11 NOTE — Telephone Encounter (Signed)
Yes, you will need to sign the script for SQ injections.  I have faxed the script to your office @ fax# [phone]. Please sign and return to me. Thanks Maudie Mercury

## 2021-04-11 NOTE — Telephone Encounter (Signed)
Pt has been approved for the [employer] patient assistance program .

## 2021-04-11 NOTE — Telephone Encounter (Addendum)
F/u FYI Notes: Patient has been approved for [employer] Patient [place] for [facility]. Once medication has been received from foundation patient will be scheduled for appointment as soon as possible.   Patient is aware and have no questions at this time.  Pat id# [id number] PHONE: [phone] [place]: [phone]

## 2021-04-11 NOTE — Telephone Encounter (Signed)
-----   Message from Gatha Mayer, MD sent at 04/05/2021  8:43 AM EDT ----- Regarding: Denial of Stelara I have received a message from [facility] patient assistance that Angel Frazier was turned down for Stelara patient assistance.  There is an appeal form for him to complete.  Remo Lipps can you please check with him and create a phone note about this to see if he has filled out the appeal form or if he needs any help from [place] to send that in.  Joelene Millin, any help you can provide or advice would be appreciated.  I appreciate all of your assistance with [place] and other patients so far.  Gatha Mayer, MD, Marval Regal

## 2021-04-17 NOTE — Telephone Encounter (Signed)
F/U Fyi note:  Stelara IV: (x4 vials) eta 11/15.  Once received we will schedule pt. Stelara SQ: (90 mg sq q8wk) script corrected and re-faxed. Will f/u with response.

## 2021-04-21 NOTE — Progress Notes (Deleted)
Chronic Care Management Pharmacy Note  04/21/2021 Name:  Angel Frazier MRN:  [id number] DOB:  04-Dec-1955  Summary: ***  Recommendations/Changes made from today's visit: ***  Plan: ***   Subjective: Angel Frazier is an 65 y.o. year old male who is a primary patient of Hunter, Brayton Mars, MD.  The CCM team was consulted for assistance with disease management and care coordination needs.    {CCMTELEPHONEFACETOFACE:21091510} for follow up visit in response to provider referral for pharmacy case management and/or care coordination services.   Consent to Services:  The patient was given the following information about Chronic Care Management services today, agreed to services, and gave verbal consent: 1. CCM service includes personalized support from designated clinical staff supervised by the primary care provider, including individualized plan of care and coordination with other care providers 2. 24/7 contact phone numbers for assistance for urgent and routine care needs. 3. Service will only be billed when office clinical staff spend 20 minutes or more in a month to coordinate care. 4. Only one practitioner may furnish and bill the service in a calendar month. 5.The patient may stop CCM services at any time (effective at the end of the month) by phone call to the office staff. 6. The patient will be responsible for cost sharing (co-pay) of up to 20% of the service fee (after annual deductible is met). Patient agreed to services and consent obtained.  Patient Care Team: [name], MD as PCP - General (Family Medicine) Renato Shin, MD as Consulting Physician (Endocrinology) Tanda Rockers, MD as Consulting Physician (Pulmonary Disease) Gatha Mayer, MD as Consulting Physician (Gastroenterology) [name], [facility] as Pharmacist (Pharmacist)  Recent office visits: None recent  Recent consult visits: 02/20/21 Loanne Drilling) - stop taking the repaglinide, continue metformin  dose  Hospital visits: {Hospital DC Yes/No:25215}   Objective:  Lab Results  Component Value Date   CREATININE 0.82 08/09/2020   BUN 14 08/09/2020   GFR 91.63 08/09/2020   GFRNONAA 58 (L) 12/26/2018   GFRAA >60 12/26/2018   NA 140 08/09/2020   K 3.6 08/09/2020   CALCIUM 9.2 08/09/2020   CO2 25 08/09/2020   GLUCOSE 117 (H) 08/09/2020    Lab Results  Component Value Date/Time   HGBA1C 5.6 02/20/2021 09:07 AM   HGBA1C 6.4 (A) 08/19/2020 08:11 AM   HGBA1C 7.1 (H) 08/09/2020 08:39 AM   HGBA1C 6.7 (H) 12/04/2018 10:09 AM   GFR 91.63 08/09/2020 08:39 AM   GFR 124.28 12/08/2019 01:24 PM   MICROALBUR 6.5 (H) 12/16/2020 10:01 AM   MICROALBUR 4.9 (H) 12/08/2019 01:24 PM   MICROALBUR 30-300 08/30/2016 09:58 AM    Last diabetic Eye exam:  Lab Results  Component Value Date/Time   HMDIABEYEEXA No Retinopathy 06/22/2020 12:00 AM    Last diabetic Foot exam: No results found for: HMDIABFOOTEX   Lab Results  Component Value Date   CHOL 102 08/09/2020   HDL 31.10 (L) 08/09/2020   LDLCALC 58 08/09/2020   LDLDIRECT 64.0 12/08/2019   TRIG 63.0 08/09/2020   CHOLHDL 3 08/09/2020    Hepatic Function Latest Ref Rng & Units 08/09/2020 12/08/2019 09/17/2019  Total Protein 6.0 - 8.3 g/dL 7.0 7.1 7.1  Albumin 3.5 - 5.2 g/dL 4.1 4.1 4.3  AST 0 - 37 U/L 36 17 43(H)  ALT 0 - 53 U/L 26 21 38  Alk Phosphatase 39 - 117 U/L 39 38(L) 42  Total Bilirubin 0.2 - 1.2 mg/dL 0.7 0.6 0.9  Bilirubin,  Direct 0.0 - 0.3 mg/dL - - -    Lab Results  Component Value Date/Time   TSH 1.18 07/20/2014 09:18 AM   TSH 1.18 05/20/2013 09:19 AM    CBC Latest Ref Rng & Units 08/09/2020 12/08/2019 09/17/2019  WBC 4.0 - 10.5 K/uL 4.4 8.1 5.0  Hemoglobin 13.0 - 17.0 g/dL 13.4 13.3 13.7  Hematocrit 39.0 - 52.0 % 39.6 39.9 41.2  Platelets 150.0 - 400.0 K/uL 219.0 250.0 225.0    Lab Results  Component Value Date/Time   VD25OH 60.19 08/09/2020 08:39 AM   VD25OH 60.02 09/17/2019 11:28 AM    Clinical ASCVD:  {YES/NO:21197} The ASCVD Risk score (Arnett DK, et al., 2019) failed to calculate for the following reasons:   The valid total cholesterol range is 130 to 320 mg/dL    Depression screen [facility] 2/9 12/16/2020 08/09/2020 06/09/2020  Decreased Interest 0 0 0  Down, Depressed, Hopeless 0 0 0  PHQ - 2 Score 0 0 0  Altered sleeping - - -  Tired, decreased energy - - -  Change in appetite - - -  Feeling bad or failure about yourself  - - -  Trouble concentrating - - -  Moving slowly or fidgety/restless - - -  Suicidal thoughts - - -  PHQ-9 Score - - -  Difficult doing work/chores - - -  Some recent data might be hidden     ***Other: (CHADS2VASc if Afib, MMRC or CAT for COPD, ACT, DEXA)  Social History   Tobacco Use  Smoking Status Former   Packs/day: 1.00   Years: 6.00   Pack years: 6.00   Types: Cigarettes   Quit date: 06/04/2000   Years since quitting: 20.8  Smokeless Tobacco Never   BP Readings from Last 3 Encounters:  03/31/21 130/78  03/09/21 (!) 142/92  03/02/21 114/84   Pulse Readings from Last 3 Encounters:  03/31/21 88  03/09/21 82  03/02/21 64   Wt Readings from Last 3 Encounters:  03/31/21 240 lb (108.9 kg)  03/02/21 238 lb (108 kg)  02/20/21 238 lb 9.6 oz (108.2 kg)   BMI Readings from Last 3 Encounters:  03/31/21 33.47 kg/m  03/02/21 33.19 kg/m  02/20/21 33.28 kg/m    Assessment/Interventions: Review of patient past medical history, allergies, medications, health status, including review of consultants reports, laboratory and other test data, was performed as part of comprehensive evaluation and provision of chronic care management services.   SDOH:  (Social Determinants of Health) assessments and interventions performed: {yes/no:20286}  SDOH Screenings   Alcohol Screen: Not on file  Depression (PHQ2-9): Low Risk    PHQ-2 Score: 0  Financial Resource Strain: Low Risk    Difficulty of Paying Living Expenses: Not hard at all  Food Insecurity: No Food  Insecurity   Worried About Charity fundraiser in the Last Year: Never true   Ran Out of Food in the Last Year: Never true  Housing: Low Risk    Last Housing Risk Score: 0  Physical Activity: Inactive   Days of Exercise per Week: 0 days   Minutes of Exercise per Session: 0 min  Social Connections: Moderately Isolated   Frequency of Communication with Friends and Family: More than three times a week   Frequency of Social Gatherings with Friends and Family: Once a week   Attends Religious Services: 1 to 4 times per year   Active Member of [employer] or Organizations: No   Attends Archivist Meetings: Never  Marital Status: Never married  Stress: No Stress Concern Present   Feeling of Stress : Not at all  Tobacco Use: Medium Risk   Smoking Tobacco Use: Former   Smokeless Tobacco Use: Never   Passive Exposure: Not on file  Transportation Needs: No Transportation Needs   Lack of Transportation (Medical): No   Lack of Transportation (Non-Medical): No    CCM Care Plan  Allergies  Allergen Reactions   Humira [Adalimumab] Rash    psoriasis   Mercaptopurine Other (See Comments)    Caused Pancreatitis    Medications Reviewed Today     Reviewed by Angel Frazier, CMA (Certified Medical Assistant) on 03/31/21 at 1143  Med List Status: <None>   Medication Order Taking? Sig Documenting Provider Last Dose Status Informant  Alcohol Swabs (B-D SINGLE USE SWABS REGULAR) PADS [id number] Yes USE AS DIRECTED OR NEEDED Renato Shin, MD Taking Active   amLODipine (NORVASC) 5 MG tablet [id number] Yes Take 1 tablet (5 mg total) by mouth at bedtime. [name], MD Taking Active   Ascorbic Acid (VITAMIN C) 1000 MG tablet [id number] Yes Take 1,000 mg by mouth daily. Reported on 09/28/2015 [provider] Taking Active Self  aspirin 81 MG tablet [id number] Yes Take 81 mg by mouth daily. Reported on 09/28/2015 [provider] Taking Active Self  atorvastatin (LIPITOR) 40 MG  tablet [id number] Yes Take 1 tablet (40 mg total) by mouth once a week. [name], MD Taking Active   Blood Glucose Calibration (TRUE METRIX LEVEL 1) Low SOLN [id number] Yes Use as directed Renato Shin, MD Taking Active   blood glucose meter kit and supplies [id number] Yes Dispense based on patient and insurance preference. Use up to four times daily as directed. (FOR ICD-9 250.00, 250.01). [name], MD Taking Active Self  Blood Glucose Monitoring Suppl (TRUE METRIX METER) w/Device Drucie Opitz [id number] Yes USE AS DIRECTED Renato Shin, MD Taking Active   Calcium Carbonate-Vitamin D (CALCIUM-VITAMIN D3 PO) [id number] Yes Take 1 tablet by mouth daily. Reported on 09/28/2015 [provider] Taking Active Self           Med Note Hardie Pulley, AMMIE J   Fri Nov 04, 2020  3:30 PM)    Cyanocobalamin (B-12 PO) [id number] Yes 5021m daily [provider] Taking Active   glucose blood (TRUE METRIX BLOOD GLUCOSE TEST) test strip [id number] Use as instructed to check blood sugar before meals. ERenato Shin MD Taking Active   mesalamine (Doristine Johns 1.2 g EC tablet [id number] Take 2 tablets (2.4 g total) by mouth 2 (two) times daily. GGatha Mayer MD Taking Active   metFORMIN (GLUCOPHAGE) 1000 MG tablet [id number] TAKE 1 TABLET(1000 MG) BY MOUTH TWICE DAILY WITH A MEAL ERenato Shin MD Taking Active   Multiple Vitamins-Minerals (CENTRUM SILVER PO) [id number] Take 1 tablet by mouth daily. Reported on 09/28/2015 [provider] Taking Active Self           Med Note (Hardie Pulley AMMIE J   Fri Nov 04, 2020  3:30 PM)    Omega-3 Fatty Acids (FISH OIL) 300 MG CAPS [id number] Take 300 mg by mouth daily. [provider] Taking Active Self  TRUEplus Lancets [id number] USE TO [place]3 TIMES DAILY ERenato Shin MD Taking Active             Patient Active Problem List   Diagnosis Date Noted   Former smoker 09/17/2019   Psoriasis caused  by Humira 04/13/2019   Hyperlipidemia associated with type 2 diabetes mellitus ([place]) 01/20/2019   Hypertension associated with diabetes ([place]) 01/20/2019   Diabetes mellitus type II, controlled ([place]) 12/22/2018   Pulmonary nodules 09/07/2015   Long term current use of systemic steroids 11/18/2014   History of avascular necrosis  11/18/2014   Vitamin D deficiency 01/24/2013   ED (erectile dysfunction) 05/21/2012   Universal ulcerative colitis ([place]) 08/11/2008    Immunization History  Administered Date(s) Administered   Fluad Quad(high Dose 65+) 03/21/2020   Influenza Split 02/15/2011   Influenza, Seasonal, Injecte, Preservative Fre 05/21/2012   Influenza,inj,Quad PF,6+ Mos 06/03/2013   Moderna SARS-COV2 Booster Vaccination 11/16/2020   Moderna Sars-Covid-2 Vaccination 08/24/2019, 09/21/2019, 04/04/2020   Td 06/04/2001   Tdap 05/21/2012, 03/09/2021    Conditions to be addressed/monitored:  HTN, DM, HLD,   There are no care plans that you recently modified to display for this patient.    Medication Assistance: {MEDASSISTANCEINFO:25044}  Compliance/Adherence/Medication fill history: Care Gaps: ***  Star-Rating Drugs: ***  Patient's preferred pharmacy is:  [facility] [address] [place] [address] Phone: [phone] Fax: [place], [place] [address] [place] [address] Phone: [phone] Fax: [phone]  Uses pill box? {Yes or If no, why not?:20788} Pt endorses ***% compliance  We discussed: {Pharmacy options:24294} Patient decided to: {US Pharmacy Plan:23885}  Care Plan and Follow Up Patient Decision:  {FOLLOWUP:24991}  Plan: {CM FOLLOW UP JFHL:45625}  ***   Current Barriers:  {pharmacybarriers:24917}  Pharmacist Clinical Goal(s):  Patient will {PHARMACYGOALCHOICES:24921} through collaboration with  PharmD and provider.   Interventions: 1:1 collaboration with [name], MD regarding development and update of comprehensive plan of care as evidenced by provider attestation and co-signature Inter-disciplinary care team collaboration (see longitudinal plan of care) Comprehensive medication review performed; medication list updated in electronic medical record  Hypertension (BP goal {CHL HP UPSTREAM Pharmacist BP ranges:[phone]}) -{US controlled/uncontrolled:25276} -Current treatment: *** -Medications previously tried: ***  -Current home readings: *** -Current dietary habits: *** -Current exercise habits: *** -{ACTIONS;DENIES/REPORTS:21021675::"Denies"} hypotensive/hypertensive symptoms -Educated on {CCM BP Counseling:25124} -Counseled to monitor BP at home ***, document, and provide log at future appointments -{CCMPHARMDINTERVENTION:25122}  Hyperlipidemia: (LDL goal < ***) -{US controlled/uncontrolled:25276} -Current treatment: *** -Medications previously tried: ***  -Current dietary patterns: *** -Current exercise habits: *** -Educated on {CCM HLD Counseling:25126} -{CCMPHARMDINTERVENTION:25122}  Diabetes (A1c goal {A1c goals:23924}) -{US controlled/uncontrolled:25276} -Current medications: *** -Medications previously tried: ***  -Current home glucose readings fasting glucose: *** post prandial glucose: *** -{ACTIONS;DENIES/REPORTS:21021675::"Denies"} hypoglycemic/hyperglycemic symptoms -Current meal patterns:  breakfast: ***  lunch: ***  dinner: *** snacks: *** drinks: *** -Current exercise: *** -Educated on {CCM DM COUNSELING:25123} -Counseled to check feet daily and get yearly eye exams -{CCMPHARMDINTERVENTION:25122}  Patient Goals/Self-Care Activities Patient will:  - {pharmacypatientgoals:24919}  Follow Up Plan: {CM FOLLOW UP WLSL:37342}

## 2021-04-24 ENCOUNTER — Ambulatory Visit (INDEPENDENT_AMBULATORY_CARE_PROVIDER_SITE_OTHER): Payer: Medicare HMO

## 2021-04-24 ENCOUNTER — Other Ambulatory Visit: Payer: Self-pay

## 2021-04-24 VITALS — BP 157/107 | HR 71 | Temp 98.2°F | Resp 18 | Ht 70.0 in | Wt 242.6 lb

## 2021-04-24 DIAGNOSIS — K51018 Ulcerative (chronic) pancolitis with other complication: Secondary | ICD-10-CM | POA: Diagnosis not present

## 2021-04-24 MED ORDER — METHYLPREDNISOLONE SODIUM SUCC 125 MG IJ SOLR: 125.0000 mg | Freq: Once | INTRAMUSCULAR | Status: DC | PRN

## 2021-04-24 MED ORDER — ALBUTEROL SULFATE HFA 108 (90 BASE) MCG/ACT IN AERS: 2.0000 | INHALATION_SPRAY | Freq: Once | RESPIRATORY_TRACT | Status: DC | PRN

## 2021-04-24 MED ORDER — EPINEPHRINE 0.3 MG/0.3ML IJ SOAJ: 0.3000 mg | Freq: Once | INTRAMUSCULAR | Status: DC | PRN

## 2021-04-24 MED ORDER — DIPHENHYDRAMINE HCL 50 MG/ML IJ SOLN: 50.0000 mg | Freq: Once | INTRAMUSCULAR | Status: DC | PRN

## 2021-04-24 MED ORDER — USTEKINUMAB 130 MG/26ML IV SOLN
520.0000 mg | Freq: Once | INTRAVENOUS | Status: AC
  Administered 2021-04-24: 520 mg via INTRAVENOUS
  Filled 2021-04-24: qty 104

## 2021-04-24 MED ORDER — SODIUM CHLORIDE 0.9 % IV SOLN: Freq: Once | INTRAVENOUS | Status: DC | PRN

## 2021-04-24 MED ORDER — FAMOTIDINE IN NACL 20-0.9 MG/50ML-% IV SOLN: 20.0000 mg | Freq: Once | INTRAVENOUS | Status: DC | PRN

## 2021-04-24 NOTE — Progress Notes (Addendum)
Diagnosis: Ulcerative Colitis  Provider:  Marshell Garfinkel, MD  Procedure: Infusion  IV Type: Peripheral, IV Location: R Antecubital  Stelara, Dose: 538m  Infusion Start Time: 1010  Infusion Stop Time: 1125 am  Post Infusion IV Care: Observation period completed and Peripheral IV Discontinued  Discharge: Condition: Good, Destination: Home . AVS provided to patient.   Performed by:  CKoren Shiver RN

## 2021-05-03 ENCOUNTER — Telehealth: Payer: Medicare HMO

## 2021-05-03 ENCOUNTER — Telehealth: Payer: Self-pay | Admitting: Pharmacist

## 2021-05-03 NOTE — Chronic Care Management (AMB) (Signed)
    Chronic Care Management Pharmacy Assistant   Name: DENYM RAHIMI  MRN: [id number] DOB: 1955/06/28   Reason for Encounter: Reschedule Appointment With Clinical Pharmacist    I called and spoke with the patient to reschedule his appointment with clinical pharmacist. He denied having any concerns with his health at this time. He does not have any immediate concerns with any of his medications.  He rescheduled his appointment for Monday 05/08/2021 at 9:30 am.  Future Appointments  Date Time Provider [place]  05/08/2021  9:30 AM [facility] CCM PHARMACIST [facility] PEC  05/15/2021  9:00 AM [facility]-CT 1 [facility]-CT LB-CT [facility]  05/18/2021  9:00 AM Tanda Rockers, MD [facility]-PULCARE None  06/12/2021  9:30 AM [facility] HEALTH COACH [facility] PEC  08/10/2021  8:20 AM [name], MD [facility] PEC  08/21/2021  9:00 AM Renato Shin, MD [facility]-LBENDO None    April D Calhoun, [place] Pharmacist Assistant [phone]

## 2021-05-04 ENCOUNTER — Telehealth: Payer: Self-pay

## 2021-05-04 NOTE — Progress Notes (Signed)
Chronic Care Management Pharmacy Note  05/08/2021 Name:  Angel Frazier MRN:  [id number] DOB:  [birth year]  Summary: PharmD follow up.  No hypoglycemia.  FBG between 104-130 lately.  Prefers to remain on same therapy.  Recommendations/Changes made from today's visit: No changes at this time, continue to monitor.  Watch for hypoglycemia  Plan: FU 4 months   Subjective: Angel Frazier is an 65 y.o. year old male who is a primary patient of Hunter, Brayton Mars, MD.  The CCM team was consulted for assistance with disease management and care coordination needs.    Engaged with patient by telephone for follow up visit in response to provider referral for pharmacy case management and/or care coordination services.   Consent to Services:  The patient was given the following information about Chronic Care Management services today, agreed to services, and gave verbal consent: 1. CCM service includes personalized support from designated clinical staff supervised by the primary care provider, including individualized plan of care and coordination with other care providers 2. 24/7 contact phone numbers for assistance for urgent and routine care needs. 3. Service will only be billed when office clinical staff spend 20 minutes or more in a month to coordinate care. 4. Only one practitioner may furnish and bill the service in a calendar month. 5.The patient may stop CCM services at any time (effective at the end of the month) by phone call to the office staff. 6. The patient will be responsible for cost sharing (co-pay) of up to 20% of the service fee (after annual deductible is met). Patient agreed to services and consent obtained.  Patient Care Team: [name], MD as PCP - General (Family Medicine) Renato Shin, MD as Consulting Physician (Endocrinology) Tanda Rockers, MD as Consulting Physician (Pulmonary Disease) Gatha Mayer, MD as Consulting Physician (Gastroenterology) Edythe Clarity,  [facility] (Pharmacist)    Recent office visits:  None   Recent consult visits:  None   Hospital visits:  None in previous 6 months  Objective:  Lab Results  Component Value Date   CREATININE 0.82 08/09/2020   BUN 14 08/09/2020   GFR 91.63 08/09/2020   GFRNONAA 58 (L) 12/26/2018   GFRAA >60 12/26/2018   NA 140 08/09/2020   K 3.6 08/09/2020   CALCIUM 9.2 08/09/2020   CO2 25 08/09/2020   GLUCOSE 117 (H) 08/09/2020    Lab Results  Component Value Date/Time   HGBA1C 5.6 02/20/2021 09:07 AM   HGBA1C 6.4 (A) 08/19/2020 08:11 AM   HGBA1C 7.1 (H) 08/09/2020 08:39 AM   HGBA1C 6.7 (H) 12/04/2018 10:09 AM   GFR 91.63 08/09/2020 08:39 AM   GFR 124.28 12/08/2019 01:24 PM   MICROALBUR 6.5 (H) 12/16/2020 10:01 AM   MICROALBUR 4.9 (H) 12/08/2019 01:24 PM   MICROALBUR 30-300 08/30/2016 09:58 AM    Last diabetic Eye exam:  Lab Results  Component Value Date/Time   HMDIABEYEEXA No Retinopathy 06/22/2020 12:00 AM    Last diabetic Foot exam: No results found for: HMDIABFOOTEX   Lab Results  Component Value Date   CHOL 102 08/09/2020   HDL 31.10 (L) 08/09/2020   LDLCALC 58 08/09/2020   LDLDIRECT 64.0 12/08/2019   TRIG 63.0 08/09/2020   CHOLHDL 3 08/09/2020    Hepatic Function Latest Ref Rng & Units 08/09/2020 12/08/2019 09/17/2019  Total Protein 6.0 - 8.3 g/dL 7.0 7.1 7.1  Albumin 3.5 - 5.2 g/dL 4.1 4.1 4.3  AST 0 - 37 U/L 36 17 43(H)  ALT 0 - 53 U/L 26 21 38  Alk Phosphatase 39 - 117 U/L 39 38(L) 42  Total Bilirubin 0.2 - 1.2 mg/dL 0.7 0.6 0.9  Bilirubin, Direct 0.0 - 0.3 mg/dL - - -    Lab Results  Component Value Date/Time   TSH 1.18 07/20/2014 09:18 AM   TSH 1.18 05/20/2013 09:19 AM    CBC Latest Ref Rng & Units 08/09/2020 12/08/2019 09/17/2019  WBC 4.0 - 10.5 K/uL 4.4 8.1 5.0  Hemoglobin 13.0 - 17.0 g/dL 13.4 13.3 13.7  Hematocrit 39.0 - 52.0 % 39.6 39.9 41.2  Platelets 150.0 - 400.0 K/uL 219.0 250.0 225.0    Lab Results  Component Value Date/Time   VD25OH 60.19  08/09/2020 08:39 AM   VD25OH 60.02 09/17/2019 11:28 AM    Clinical ASCVD: No  The ASCVD Risk score (Arnett DK, et al., 2019) failed to calculate for the following reasons:   The valid total cholesterol range is 130 to 320 mg/dL    Depression screen [facility] 2/9 12/16/2020 08/09/2020 06/09/2020  Decreased Interest 0 0 0  Down, Depressed, Hopeless 0 0 0  PHQ - 2 Score 0 0 0  Altered sleeping - - -  Tired, decreased energy - - -  Change in appetite - - -  Feeling bad or failure about yourself  - - -  Trouble concentrating - - -  Moving slowly or fidgety/restless - - -  Suicidal thoughts - - -  PHQ-9 Score - - -  Difficult doing work/chores - - -  Some recent data might be hidden      Social History   Tobacco Use  Smoking Status Former   Packs/day: 1.00   Years: 6.00   Pack years: 6.00   Types: Cigarettes   Quit date: 06/04/2000   Years since quitting: 20.9  Smokeless Tobacco Never   BP Readings from Last 3 Encounters:  04/24/21 (!) 157/107  03/31/21 130/78  03/09/21 (!) 142/92   Pulse Readings from Last 3 Encounters:  04/24/21 71  03/31/21 88  03/09/21 82   Wt Readings from Last 3 Encounters:  04/24/21 242 lb 9.6 oz (110 kg)  03/31/21 240 lb (108.9 kg)  03/02/21 238 lb (108 kg)   BMI Readings from Last 3 Encounters:  04/24/21 34.81 kg/m  03/31/21 33.47 kg/m  03/02/21 33.19 kg/m    Assessment/Interventions: Review of patient past medical history, allergies, medications, health status, including review of consultants reports, laboratory and other test data, was performed as part of comprehensive evaluation and provision of chronic care management services.   SDOH:  (Social Determinants of Health) assessments and interventions performed: Yes  Financial Resource Strain: Low Risk    Difficulty of Paying Living Expenses: Not hard at all    SDOH Screenings   Alcohol Screen: Not on file  Depression (PHQ2-9): Low Risk    PHQ-2 Score: 0  Financial Resource Strain: Low  Risk    Difficulty of Paying Living Expenses: Not hard at all  Food Insecurity: No Food Insecurity   Worried About Charity fundraiser in the Last Year: Never true   Ran Out of Food in the Last Year: Never true  Housing: Low Risk    Last Housing Risk Score: 0  Physical Activity: Inactive   Days of Exercise per Week: 0 days   Minutes of Exercise per Session: 0 min  Social Connections: Moderately Isolated   Frequency of Communication with Friends and Family: More than three times a week  Frequency of Social Gatherings with Friends and Family: Once a week   Attends Religious Services: 1 to 4 times per year   Active Member of [employer] or Organizations: No   Attends Music therapist: Never   Marital Status: Never married  Stress: No Stress Concern Present   Feeling of Stress : Not at all  Tobacco Use: Medium Risk   Smoking Tobacco Use: Former   Smokeless Tobacco Use: Never   Passive Exposure: Not on file  Transportation Needs: No Transportation Needs   Lack of Transportation (Medical): No   Lack of Transportation (Non-Medical): No    CCM Care Plan  Allergies  Allergen Reactions   Humira [Adalimumab] Rash    psoriasis   Mercaptopurine Other (See Comments)    Caused Pancreatitis    Medications Reviewed Today     Reviewed by Edythe Clarity, [facility] (Pharmacist) on 05/08/21 at 1349  Med List Status: <None>   Medication Order Taking? Sig Documenting Provider Last Dose Status Informant  Alcohol Swabs (B-D SINGLE USE SWABS REGULAR) PADS [id number] Yes USE AS DIRECTED OR NEEDED Renato Shin, MD Taking Active   amLODipine (NORVASC) 5 MG tablet [id number] Yes Take 1 tablet (5 mg total) by mouth at bedtime. [name], MD Taking Active   Ascorbic Acid (VITAMIN C) 1000 MG tablet [id number] Yes Take 1,000 mg by mouth daily. Reported on 09/28/2015 [provider] Taking Active Self  aspirin 81 MG tablet [id number] Yes Take 81 mg by mouth daily. Reported on 09/28/2015  [provider] Taking Active Self  atorvastatin (LIPITOR) 40 MG tablet [id number] Yes Take 1 tablet (40 mg total) by mouth once a week. [name], MD Taking Active   Blood Glucose Calibration (TRUE METRIX LEVEL 1) Low SOLN [id number] Yes Use as directed Renato Shin, MD Taking Active   blood glucose meter kit and supplies [id number] Yes Dispense based on patient and insurance preference. Use up to four times daily as directed. (FOR ICD-9 250.00, 250.01). [name], MD Taking Active Self  Blood Glucose Monitoring Suppl (TRUE METRIX METER) w/Device Drucie Opitz [id number] Yes USE AS DIRECTED Renato Shin, MD Taking Active   Calcium Carbonate-Vitamin D (CALCIUM-VITAMIN D3 PO) [id number] Yes Take 1 tablet by mouth daily. Reported on 09/28/2015 [provider] Taking Active Self           Med Note Hardie Pulley, AMMIE J   Fri Nov 04, 2020  3:30 PM)    Cyanocobalamin (B-12 PO) [id number] Yes 503m daily [provider] Taking Active   glucose blood (TRUE METRIX BLOOD GLUCOSE TEST) test strip [id number] Use as instructed to check blood sugar before meals. ERenato Shin MD Taking Active   mesalamine (Doristine Johns 1.2 g EC tablet [id number] Take 2 tablets (2.4 g total) by mouth 2 (two) times daily. GGatha Mayer MD Taking Active   metFORMIN (GLUCOPHAGE) 1000 MG tablet [id number] TAKE 1 TABLET(1000 MG) BY MOUTH TWICE DAILY WITH A MEAL ERenato Shin MD Taking Active   Multiple Vitamins-Minerals (CENTRUM SILVER PO) [id number] Take 1 tablet by mouth daily. Reported on 09/28/2015 [provider] Taking Active Self           Med Note (Hardie Pulley AMMIE J   Fri Nov 04, 2020  3:30 PM)    Omega-3 Fatty Acids (FISH OIL) 300 MG CAPS [id number] Take 300 mg by mouth daily. [provider] Taking Active Self  predniSONE (DELTASONE) 10 MG tablet [id number] Take 4 tablets (40 mg  total) by mouth daily with breakfast for 3 days, THEN 3 tablets (30 mg total) daily with  breakfast for 3 days, THEN 2 tablets (20 mg total) daily with breakfast for 14 days, THEN 1 tablet (10 mg total) daily with breakfast for 14 days, THEN 0.5 tablets (5 mg total) daily with breakfast for 14 days. Gatha Mayer, MD Taking Active   TRUEplus Lancets 33G MISC [id number] Yes USE TO CHECK SUGAR BEFORE MEALS 3 TIMES DAILY Renato Shin, MD Taking Active             Patient Active Problem List   Diagnosis Date Noted   Former smoker 09/17/2019   Psoriasis caused by Humira 04/13/2019   Hyperlipidemia associated with type 2 diabetes mellitus ([place]) 01/20/2019   Hypertension associated with diabetes ([place]) 01/20/2019   Diabetes mellitus type II, controlled ([place]) 12/22/2018   Pulmonary nodules 09/07/2015   Long term current use of systemic steroids 11/18/2014   History of avascular necrosis  11/18/2014   Vitamin D deficiency 01/24/2013   ED (erectile dysfunction) 05/21/2012   Universal ulcerative colitis ([place]) 08/11/2008    Immunization History  Administered Date(s) Administered   Fluad Quad(high Dose 65+) 03/21/2020   Influenza Split 02/15/2011   Influenza, Seasonal, Injecte, Preservative Fre 05/21/2012   Influenza,inj,Quad PF,6+ Mos 06/03/2013   Moderna SARS-COV2 Booster Vaccination 11/16/2020   Moderna Sars-Covid-2 Vaccination 08/24/2019, 09/21/2019, 04/04/2020   Td 06/04/2001   Tdap 05/21/2012, 03/09/2021    Conditions to be addressed/monitored:  Hypertension, Hyperlipidemia, and Diabetes  Care Plan : General Pharmacy (Adult)  Updates made by Edythe Clarity, RPH since 05/08/2021 12:00 AM     Problem: HTN, HLD, DM   Priority: High  Onset Date: 05/08/2021  Note:   Current Barriers:  Lack of routine physical activity.  Pharmacist Clinical Goal(s):  Patient will maintain control of glucose and lipids as evidenced by labs  through collaboration with PharmD and provider.   Interventions: 1:1 collaboration with [name], MD regarding development and  update of comprehensive plan of care as evidenced by provider attestation and co-signature Inter-disciplinary care team collaboration (see longitudinal plan of care) Comprehensive medication review performed; medication list updated in electronic medical record  Hypertension (BP goal <130/80) -Controlled -Current treatment: Amlodipine 37m daily -Medications previously tried:  none noted -Current home readings: not checking currently at home -Current dietary habits: balanced diet, limits salts and fast foods -Current exercise habits: 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  Hyperlipidemia: (LDL goal < 70) -Controlled -Current treatment: Atorvastatin 457mdaily -Medications previously tried: none noted  -Current dietary patterns: see above -Current exercise habits: see above -Educated on Cholesterol goals;  Benefits of statin for ASCVD risk reduction; Importance of limiting foods high in cholesterol; Exercise goal of 150 minutes per week; -Recommended to continue current medication LDL well controlled, patient is adherent, no adverse effects with medication  Diabetes (A1c goal <7%) -Controlled -Current medications: Metformin 100019mwice daily per meal -Medications previously tried: Lantus, Novolog, Rybelsus, pioglitazone  -  Current home glucose readings fasting glucose: 125 this morning, 126, 104, 115 post prandial glucose: not  checking -Denies hypoglycemic/hyperglycemic symptoms -Current exercise: 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  Prefers to remain on current therapy.  He is not having any episodes of hypoglycemia per his reports which is my main concern.  This is encouraging news!  Continue for now, incorporate physical activity in exercise routine.  Continue routine A1c screenings and monitoring as current.  Patient Goals/Self-Care Activities Patient will:  - take medications as prescribed as evidenced by patient report and record review check glucose daily, document, and provide at future appointments  Follow Up Plan: The care management team will reach out to the patient again over the next 120 days.        Goal: Patient-Specific Goal        Medication Assistance: None required.  Patient affirms current coverage meets needs.  Compliance/Adherence/Medication fill history: Care Gaps: Pneumonia Vaccine  Star-Rating Drugs: Metformin 02/20/21 90ds Atorvastatin 11/26/20 90ds?? - reports having some at home  Patient's preferred pharmacy is:  [facility], [address] [place] [address] Phone: [phone] Fax: [phone]  [facility] - [address] [place] [address] Phone: [phone] Fax: [phone]    We discussed: Benefits of medication synchronization, packaging and delivery as well as enhanced pharmacist oversight with [employer]. Patient decided to: Continue current medication management strategy  Care Plan and Follow Up  Patient Decision:  Patient agrees to Care Plan and Follow-up.  Plan: The care management team will reach out to the patient again over the next 120 days.  Beverly Milch, PharmD Clinical Pharmacist  [facility] [phone]

## 2021-05-04 NOTE — Telephone Encounter (Signed)
Message received that Pt called into office yesterday stating that he made need some Steroids.  Pt was called. Left message for pt to call back

## 2021-05-05 ENCOUNTER — Telehealth: Payer: Self-pay | Admitting: Internal Medicine

## 2021-05-05 MED ORDER — PREDNISONE 10 MG PO TABS: ORAL_TABLET | ORAL | 0 refills | Status: AC

## 2021-05-05 NOTE — Telephone Encounter (Signed)
Patient called back wanting to speak with you about the prescription called in for him.  He is not sure his insurance will cover it, but says when he went to the pharmacy they said they hadn't received a prescription for him.  Patient seems confused and needs some clarification.  Thank you.

## 2021-05-05 NOTE — Telephone Encounter (Signed)
Pharmacy called and they stated that the insurance does cover the budesonide although his copay is $285.00 Please advise

## 2021-05-05 NOTE — Telephone Encounter (Signed)
I had prescribed budesonide.  Did he get that?  Sometimes it may be expensive and so perhaps he did not fill that?

## 2021-05-05 NOTE — Telephone Encounter (Signed)
See other phone note as well.  I sent in a prescription for prednisone because budesonide is too expensive.  Taper sent.   Please arrange a January follow-up visit for this patient.  He reports that they are working on another [place] for his Stelara subcutaneous injections.  The program he was initially improved for is being ended December 31

## 2021-05-05 NOTE — Telephone Encounter (Signed)
Pt states that he has had an UC flare up since his Colonoscopy . Pt states that he did have one infusion of the Stelara but it seems that there are issues with his Insurance company in regard to future infusions. Pt states that's Kim at the Infusion center is working on the issue. Pt is requesting if he could get a round of prednisone to help with this Flare. Please advise.

## 2021-05-05 NOTE — Telephone Encounter (Signed)
Pt notified of the prescription  Pt stated that he would go to the Drug Store today and pick it up. Pt verbalized understanding with all questions answered.

## 2021-05-08 ENCOUNTER — Ambulatory Visit: Payer: Medicare HMO

## 2021-05-08 DIAGNOSIS — E785 Hyperlipidemia, unspecified: Secondary | ICD-10-CM

## 2021-05-08 DIAGNOSIS — E1169 Type 2 diabetes mellitus with other specified complication: Secondary | ICD-10-CM

## 2021-05-08 DIAGNOSIS — I152 Hypertension secondary to endocrine disorders: Secondary | ICD-10-CM

## 2021-05-08 DIAGNOSIS — E119 Type 2 diabetes mellitus without complications: Secondary | ICD-10-CM

## 2021-05-08 NOTE — Telephone Encounter (Signed)
Pt was notified of the prescription that was sent in.  Appointment made for an OV with Dr. Carlean Purl on Jan 18,2023 @ 10:10. Pt made aware.  Pt verbalized understanding

## 2021-05-08 NOTE — Patient Instructions (Addendum)
Visit Information   Goals Addressed             This Visit's Progress    Monitor and Manage My Blood Sugar-Diabetes Type 2       Timeframe:  Long-Range Goal Priority:  High Start Date:   05/08/21                          Expected End Date:  11/06/21                     Follow Up Date 08/06/21    - check blood sugar at prescribed times - check blood sugar before and after exercise - check blood sugar if I feel it is too high or too low - enter blood sugar readings and medication or insulin into daily log    Why is this important?   Checking your blood sugar at home helps to keep it from getting very high or very low.  Writing the results in a diary or log helps the doctor know how to care for you.  Your blood sugar log should have the time, date and the results.  Also, write down the amount of insulin or other medicine that you take.  Other information, like what you ate, exercise done and how you were feeling, will also be helpful.     Notes:        Patient Care Plan: General Pharmacy (Adult)     Problem Identified: HTN, HLD, DM   Priority: High  Onset Date: 05/08/2021  Note:   Current Barriers:  Lack of routine physical activity.  Pharmacist Clinical Goal(s):  Patient will maintain control of glucose and lipids as evidenced by labs  through collaboration with PharmD and provider.   Interventions: 1:1 collaboration with [name], MD regarding development and update of comprehensive plan of care as evidenced by provider attestation and co-signature Inter-disciplinary care team collaboration (see longitudinal plan of care) Comprehensive medication review performed; medication list updated in electronic medical record  Hypertension (BP goal <130/80) -Controlled -Current treatment: Amlodipine 35m daily -Medications previously tried:  none noted -Current home readings: not checking currently at home -Current dietary habits: balanced diet, limits salts and  fast foods -Current exercise habits: 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  Hyperlipidemia: (LDL goal < 70) -Controlled -Current treatment: Atorvastatin 468mdaily -Medications previously tried: none noted  -Current dietary patterns: see above -Current exercise habits: see above -Educated on Cholesterol goals;  Benefits of statin for ASCVD risk reduction; Importance of limiting foods high in cholesterol; Exercise goal of 150 minutes per week; -Recommended to continue current medication LDL well controlled, patient is adherent, no adverse effects with medication  Diabetes (A1c goal <7%) -Controlled -Current medications: Metformin 100064mwice daily per meal -Medications previously tried: Lantus, Novolog, Rybelsus, pioglitazone  -Current home glucose readings fasting glucose: 125 this morning, 126, 104, 115 post prandial glucose: not checking -Denies hypoglycemic/hyperglycemic symptoms -Current exercise:  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  Prefers to remain on current therapy.  He is not having any episodes of hypoglycemia  per his reports which is my main concern.  This is encouraging news!  Continue for now, incorporate physical activity in exercise routine.  Continue routine A1c screenings and monitoring as current.  Patient Goals/Self-Care Activities Patient will:  - take medications as prescribed as evidenced by patient report and record review check glucose daily, document, and provide at future appointments  Follow Up Plan: The care management team will reach out to the patient again over the next 120 days.        Goal: Patient-Specific Goal        Patient verbalizes understanding of instructions provided today and agrees to view in [place].  Telephone follow up appointment with pharmacy team member scheduled for: 4 months  Edythe Clarity, [place]

## 2021-05-11 NOTE — Telephone Encounter (Signed)
Angel Frazier - please do Rx and arrange the f/u he needs - If he does not have an OV me I would ask we get one for February As far as the injection training am not certain if Ms. [name] is doing this

## 2021-05-11 NOTE — Telephone Encounter (Signed)
F/U:  Patient will need training on self administration for Stelara SQ.  Please set office visit for patient to receive training.   Please place script order to [facility]([phone]) before 06/03/21 due to the Patient Assistance Program will expire by 06/03/21.  Patient will be enrolled in a new program for the upcoming year 2023.

## 2021-05-12 ENCOUNTER — Other Ambulatory Visit (HOSPITAL_COMMUNITY): Payer: Self-pay

## 2021-05-12 ENCOUNTER — Other Ambulatory Visit: Payer: Self-pay

## 2021-05-12 DIAGNOSIS — K51018 Ulcerative (chronic) pancolitis with other complication: Secondary | ICD-10-CM

## 2021-05-12 MED ORDER — USTEKINUMAB 130 MG/26ML IV SOLN
520.0000 mg | Freq: Once | INTRAVENOUS | 0 refills | Status: DC
  Filled 2021-05-12: qty 52, fill #0

## 2021-05-12 MED ORDER — USTEKINUMAB 130 MG/26ML IV SOLN
520.0000 mg | Freq: Once | INTRAVENOUS | 0 refills | Status: DC
  Filled 2021-05-12: qty 104, 1d supply, fill #0

## 2021-05-12 MED ORDER — USTEKINUMAB 90 MG/ML ~~LOC~~ SOSY
PREFILLED_SYRINGE | SUBCUTANEOUS | 6 refills | Status: DC
  Filled 2021-05-12: qty 1, fill #0
  Filled 2021-05-22 (×2): qty 1, 56d supply, fill #0
  Filled 2021-08-02: qty 1, 56d supply, fill #1

## 2021-05-12 NOTE — Telephone Encounter (Signed)
Prescriptions sent to pharmacy Pt previously scheduled for an OV on 06/21/2021 @ 10:10.

## 2021-05-13 ENCOUNTER — Other Ambulatory Visit (HOSPITAL_COMMUNITY): Payer: Self-pay

## 2021-05-13 ENCOUNTER — Encounter: Payer: Self-pay | Admitting: Internal Medicine

## 2021-05-15 ENCOUNTER — Other Ambulatory Visit (HOSPITAL_COMMUNITY): Payer: Self-pay

## 2021-05-15 ENCOUNTER — Ambulatory Visit (INDEPENDENT_AMBULATORY_CARE_PROVIDER_SITE_OTHER)
Admission: RE | Admit: 2021-05-15 | Discharge: 2021-05-15 | Disposition: A | Discharge status: Home / Self Care | Payer: Medicare HMO | Source: Ambulatory Visit | Attending: Adult Health | Admitting: Adult Health

## 2021-05-15 ENCOUNTER — Other Ambulatory Visit: Payer: Self-pay

## 2021-05-15 ENCOUNTER — Encounter: Payer: Self-pay | Admitting: Internal Medicine

## 2021-05-15 DIAGNOSIS — R911 Solitary pulmonary nodule: Secondary | ICD-10-CM | POA: Diagnosis not present

## 2021-05-15 DIAGNOSIS — R918 Other nonspecific abnormal finding of lung field: Secondary | ICD-10-CM

## 2021-05-15 DIAGNOSIS — I700 Atherosclerosis of aorta: Secondary | ICD-10-CM | POA: Diagnosis not present

## 2021-05-15 DIAGNOSIS — J439 Emphysema, unspecified: Secondary | ICD-10-CM | POA: Diagnosis not present

## 2021-05-16 ENCOUNTER — Telehealth: Payer: Self-pay | Admitting: *Deleted

## 2021-05-16 ENCOUNTER — Other Ambulatory Visit (HOSPITAL_COMMUNITY): Payer: Self-pay

## 2021-05-16 ENCOUNTER — Other Ambulatory Visit: Payer: Medicare HMO

## 2021-05-16 ENCOUNTER — Other Ambulatory Visit: Payer: Self-pay | Admitting: *Deleted

## 2021-05-16 DIAGNOSIS — R918 Other nonspecific abnormal finding of lung field: Secondary | ICD-10-CM

## 2021-05-16 NOTE — Progress Notes (Signed)
Patient returned call, spoke with patient, advised him of results/recommendations per Rexene Edison NP.  Advised he will receive a call from one of our patient care coordinators to schedule the PET scan.  He verbalized understanding.  Nothing further needed.

## 2021-05-16 NOTE — Progress Notes (Signed)
ATC on home and cell #, left VM at both #'s to return call.

## 2021-05-16 NOTE — Telephone Encounter (Signed)
ATC patient on home and cell #, left VM to return call.

## 2021-05-17 NOTE — Telephone Encounter (Signed)
Fax received from [employer] for a PA for the [place] 138m/26 ml  [facility]inesCPhT  [facility] KJoelene Millinstated that pt has already had the infusion completed and that now he will only need the Stelera injections. KJoelene Millinnotified that pt has an appointment on 06/21/2021. KJoelene Millinstated that she will ensure that LChayson Chartershis first injection to the appointment so that he can be educated on the injection and he will received the first dose that day.

## 2021-05-17 NOTE — Telephone Encounter (Signed)
Left V/m with patient in regards to picking up Stelara SQ @ [facility] prior to 12/31 (due to PAP will expire)  Awaiting call back from patient to discuss details further.   Patient enrollment forms for new PAP Sande Rives [phone]) has been faxed. Awaiting f/u.

## 2021-05-18 ENCOUNTER — Other Ambulatory Visit (HOSPITAL_COMMUNITY): Payer: Self-pay

## 2021-05-18 ENCOUNTER — Ambulatory Visit: Payer: Medicare HMO | Admitting: Internal Medicine

## 2021-05-18 NOTE — Telephone Encounter (Addendum)
Patient will pick up script from [facility] ([place] before 06/03/21.  He understands he will bring the injection with him at his next scheduled appt 06/21/21 @ Carlean Purl, MD, for self-administration and also training. Patient understood instructions and have no further questions.

## 2021-05-22 ENCOUNTER — Other Ambulatory Visit (HOSPITAL_COMMUNITY): Payer: Self-pay

## 2021-05-22 NOTE — Telephone Encounter (Signed)
F/u note: Spoke w/ Chasity @ [facility], test claim was run/processed w/co-pay card. Will give f/u phone call reminder for patient to pick up medication prior to PAP expiring 06/03/21.

## 2021-05-24 ENCOUNTER — Telehealth: Payer: Self-pay | Admitting: Pharmacist

## 2021-05-24 ENCOUNTER — Other Ambulatory Visit (HOSPITAL_COMMUNITY): Payer: Self-pay

## 2021-05-24 NOTE — Chronic Care Management (AMB) (Signed)
Chronic Care Management Pharmacy Assistant   Name: Angel Frazier  MRN: [id number] DOB: 06-Nov-1955   Reason for Encounter: Hypertension Adherence Call    Recent office visits:  None  Recent consult visits:  None  Hospital visits:  None in previous 6 months  Medications: Outpatient Encounter Medications as of 05/24/2021  Medication Sig   Alcohol Swabs (B-D SINGLE USE SWABS REGULAR) PADS USE AS DIRECTED OR NEEDED   amLODipine (NORVASC) 5 MG tablet Take 1 tablet (5 mg total) by mouth at bedtime.   Ascorbic Acid (VITAMIN C) 1000 MG tablet Take 1,000 mg by mouth daily. Reported on 09/28/2015   aspirin 81 MG tablet Take 81 mg by mouth daily. Reported on 09/28/2015   atorvastatin (LIPITOR) 40 MG tablet Take 1 tablet (40 mg total) by mouth once a week.   Blood Glucose Calibration (TRUE METRIX LEVEL 1) Low SOLN Use as directed   blood glucose meter kit and supplies Dispense based on patient and insurance preference. Use up to four times daily as directed. (FOR ICD-9 250.00, 250.01).   Blood Glucose Monitoring Suppl (TRUE METRIX METER) w/Device KIT USE AS DIRECTED   Calcium Carbonate-Vitamin D (CALCIUM-VITAMIN D3 PO) Take 1 tablet by mouth daily. Reported on 09/28/2015   Cyanocobalamin (B-12 PO) 5024m daily   glucose blood (TRUE METRIX BLOOD GLUCOSE TEST) test strip Use as instructed to check blood sugar before meals.   mesalamine (LIALDA) 1.2 g EC tablet Take 2 tablets (2.4 g total) by mouth 2 (two) times daily.   metFORMIN (GLUCOPHAGE) 1000 MG tablet TAKE 1 TABLET(1000 MG) BY MOUTH TWICE DAILY WITH A MEAL   Multiple Vitamins-Minerals (CENTRUM SILVER PO) Take 1 tablet by mouth daily. Reported on 09/28/2015   Omega-3 Fatty Acids (FISH OIL) 300 MG CAPS Take 300 mg by mouth daily.   predniSONE (DELTASONE) 10 MG tablet Take 4 tablets (40 mg total) by mouth daily with breakfast for 3 days, THEN 3 tablets (30 mg total) daily with breakfast for 3 days, THEN 2 tablets (20 mg total) daily with  breakfast for 14 days, THEN 1 tablet (10 mg total) daily with breakfast for 14 days, THEN 0.5 tablets (5 mg total) daily with breakfast for 14 days.   TRUEplus Lancets 33G MISC USE TO CHECK SUGAR BEFORE MEALS 3 TIMES DAILY   ustekinumab (STELARA) 90 MG/ML SOSY injection Inject 1syringe under the skin every 8 weeks.   No facility-administered encounter medications on file as of 05/24/2021.   Reviewed chart prior to disease state call. Spoke with patient regarding BP  Recent Office Vitals: BP Readings from Last 3 Encounters:  04/24/21 (!) 157/107  03/31/21 130/78  03/09/21 (!) 142/92   Pulse Readings from Last 3 Encounters:  04/24/21 71  03/31/21 88  03/09/21 82    Wt Readings from Last 3 Encounters:  04/24/21 242 lb 9.6 oz (110 kg)  03/31/21 240 lb (108.9 kg)  03/02/21 238 lb (108 kg)     Kidney Function Lab Results  Component Value Date/Time   CREATININE 0.82 08/09/2020 08:39 AM   CREATININE 0.76 12/08/2019 01:24 PM   GFR 91.63 08/09/2020 08:39 AM   GFRNONAA 58 (L) 12/26/2018 03:36 AM   GFRAA >60 12/26/2018 03:36 AM    BMP Latest Ref Rng & Units 08/09/2020 12/08/2019 09/17/2019  Glucose 70 - 99 mg/dL 117(H) 129(H) 112(H)  BUN 6 - 23 mg/dL 14 14 12   Creatinine 0.40 - 1.50 mg/dL 0.82 0.76 0.87  Sodium 135 - 145 mEq/L 140 138  138  Potassium 3.5 - 5.1 mEq/L 3.6 3.8 4.0  Chloride 96 - 112 mEq/L 104 98 104  CO2 19 - 32 mEq/L 25 29 25   Calcium 8.4 - 10.5 mg/dL 9.2 9.5 9.1    Current antihypertensive regimen:  Amlodipine 5 mg daily  How often are you checking your Blood Pressure? 140-150/89-90  What recent interventions/DTPs have been made by any provider to improve Blood Pressure control since last CPP Visit: None per patient  Any recent hospitalizations or ED visits since last visit with CPP? No  Adherence Review: Is the patient currently on ACE/ARB medication? No Does the patient have >5 day gap between last estimated fill dates? No   Care Gaps: Medicare Annual  Wellness: Completed 06/09/2020 Ophthalmology Exam: Next due on 06/22/2021 Foot Exam: Next due on 02/20/2022 Hemoglobin A1C: 7.1% on 08/09/2020 Colonoscopy: Completed 03/02/2021  Future Appointments  Date Time Provider [place]  06/01/2021 10:00 AM [facility] PET CT 1 [facility] [place]  06/09/2021 10:30 AM Tanda Rockers, MD [facility] None  06/12/2021  9:30 AM [facility] HEALTH COACH [facility]  06/21/2021 10:10 AM Gatha Mayer, MD [facility]  08/10/2021  8:20 AM [name], MD [facility]  08/21/2021  9:00 AM Renato Shin, MD [facility] None  09/12/2021  2:00 PM [facility] CCM PHARMACIST [facility]    Star Rating Drugs: Atorvastatin 40 mg last filled 11/26/2020 90 DS Metformin 1000 mg last filled 02/20/2021 90 DS  April D Calhoun, [place] Pharmacist Assistant [phone]

## 2021-05-31 ENCOUNTER — Other Ambulatory Visit: Payer: Self-pay | Admitting: Family Medicine

## 2021-06-01 ENCOUNTER — Other Ambulatory Visit: Payer: Self-pay

## 2021-06-01 ENCOUNTER — Ambulatory Visit (HOSPITAL_COMMUNITY)
Admission: RE | Admit: 2021-06-01 | Discharge: 2021-06-01 | Disposition: A | Discharge status: Home / Self Care | Payer: Medicare HMO | Source: Ambulatory Visit | Attending: Adult Health | Admitting: Adult Health

## 2021-06-01 DIAGNOSIS — R918 Other nonspecific abnormal finding of lung field: Secondary | ICD-10-CM | POA: Diagnosis not present

## 2021-06-01 LAB — GLUCOSE, CAPILLARY: Glucose-Capillary: 129 mg/dL — ABNORMAL HIGH (ref 70–99)

## 2021-06-01 MED ORDER — FLUDEOXYGLUCOSE F - 18 (FDG) INJECTION
12.2000 | Freq: Once | INTRAVENOUS | Status: AC
  Administered 2021-06-01: 10:00:00 12.2 via INTRAVENOUS

## 2021-06-09 ENCOUNTER — Other Ambulatory Visit: Payer: Self-pay

## 2021-06-09 ENCOUNTER — Ambulatory Visit: Payer: Medicare HMO | Admitting: Internal Medicine

## 2021-06-09 ENCOUNTER — Encounter: Payer: Self-pay | Admitting: Internal Medicine

## 2021-06-09 DIAGNOSIS — R918 Other nonspecific abnormal finding of lung field: Secondary | ICD-10-CM | POA: Diagnosis not present

## 2021-06-09 NOTE — Assessment & Plan Note (Signed)
1st detected 2017 on abd ct CT 09/25/15 Multiple bilateral pulmonary nodules, measuring up to 9 mm  - CT 04/24/16 1. Stable numerous (at least 12) solid pulmonary nodules scattered throughout both lungs measuring up to 9 mm in the right lower lobe, for which 6 month stability has been demonstrated, suggesting a benign etiology.  2. Stable apical right upper lobe 1.1 cm ground-glass pulmonary nodule. Stable medial right middle lobe ground-glass and possibly cavitary 1.0 cm pulmonary nodule. Given persistence, repeat CT is recommended every 2 years until 5 years of stability   - CT chest 04/29/2017 1. Scattered solid pulmonary nodules measure 8 mm or less in size, are unchanged from 09/21/2015 and are considered benign. 2. 10 mm ground-glass nodule in the medial segment right middle lobe, stable. Follow-up in 2 years is recommended, as clinically indicated, as a low-grade adenocarcinoma cannot be excluded   - CT 05/04/2019 1. Increased size of 9 mm ground-glass nodule in medial left upper lobe. Recommend continued follow-up by chest CT without contrast in 12 months.  - CT 05/19/2020  C/w progressive met dz > - PET 06/09/2020 Persistent bilateral solid and ground-glass pulmonary nodules show low-grade FDG uptake; low-grade carcinoma cannot be excluded. No evidence of thoracic lymph node or distant metastatic disease. Focal FDG uptake in the right prostate peripheral zone, which may be due to high-grade prostate carcinoma or prostatitis  >>> urology referral 06/09/2020 >>> Jeffie Pollock f/u  CT chest 05/2021 -Several enlarging and increasingly solid pulmonary nodules bilaterally, most notably in the right middle and lower lobes. The pre-existing solid nodules are largely stable with the largest nodule in the right lower lobe decreased in size from the previous study. The findings are indeterminate for malignancy. Recommend multi disciplinary thoracic referral for further evaluation >>>PET scan   06/01/21 1. The enlarging and increasingly solid pulmonary nodules do not demonstrate any hypermetabolic activity. While reassuring, adenocarcinoma precursor not excluded. Given multiplicity, continued CT follow-up in 6 months is recommended unless tissue sampling is warranted clinically. 2. Persistent focal hypermetabolic activity in the peripheral zone of the right posterior prostate apex corresponding with a PI-RADS category 4 lesion on MRI. 3. No evidence of metastatic disease.  Most likely this is UC with pulmonary involvement rather than met adenoca from ? Primary and the key to rx is to treat the UC aggressively which is the plan per pt with f/u with Dr Carlean Purl planned  Would rec ct 6-12 m and he chose 9 so placed in reminder file  Discussed in detail all the  indications, usual  risks and alternatives  relative to the benefits with patient who agrees to proceed with w/u as outlined.            Each maintenance medication was reviewed in detail including emphasizing most importantly the difference between maintenance and prns and under what circumstances the prns are to be triggered using an action plan format where appropriate.  Total time for H and P, chart review, counseling, reviewing studies  and generating customized AVS unique to this office visit / same day charting = 22 min

## 2021-06-09 NOTE — Patient Instructions (Signed)
See Dr Jeffie Pollock this year and let him know about your PET scan  We will call you to schedule a follow up scan sept 2023

## 2021-06-09 NOTE — Progress Notes (Signed)
Subjective:     Patient ID: Angel Frazier, male   DOB: 11-Oct-1955,   MRN: [id number]  HPI  15 yobm quit smoking 2002 p pna with no residual symptoms and nl baseline cxr and CT ABD for ulcerative colitis  with MPN's confirmed on CT chest so referred to pulmonary clinic 09/28/2015 by Dr Carlean Purl.  09/28/2015 1st [facility] office visit/ [name]   No symptoms at all - Not limited by breathing from desired activities  / no cough rec CT 04/24/16 1. Stable numerous (at least 12) solid pulmonary nodules scattered throughout both lungs measuring up to 9 mm in the right lower lobe, for which 6 month stability has been demonstrated, suggesting a benign etiology.  2. Stable apical right upper lobe 1.1 cm ground-glass pulmonary nodule. Stable medial right middle lobe ground-glass and possibly cavitary 1.0 cm pulmonary nodule. Given persistence, repeat CT is recommended every 2 years until 5 years of stability   - CT chest 04/29/2017 1. Scattered solid pulmonary nodules measure 8 mm or less in size, are unchanged from 09/21/2015 and are considered benign. 2. 10 mm ground-glass nodule in the medial segment right middle lobe, stable. Follow-up in 2 years is recommended, as clinically indicated, as a low-grade adenocarcinoma cannot be excluded   - CT 05/04/2019 1. Increased size of 9 mm ground-glass nodule in medial left upper lobe. Recommend continued follow-up by chest CT without contrast in 12 months.  - CT 05/19/2020  C/w progressive met dz > - PET 06/09/2020 Persistent bilateral solid and ground-glass pulmonary nodules show low-grade FDG uptake; low-grade carcinoma cannot be excluded.  No evidence of thoracic lymph node or distant metastatic disease.  Focal FDG uptake in the right prostate peripheral zone, which may be due to high-grade prostate carcinoma or prostatitis  >>> urology referral 06/09/2020 >>>  - f/u pulmonary ov 09/07/20 to regroup   08/16/2020  f/u ov/[name] re: mpns in pt with UC Chief  Complaint  Patient presents with   Follow-up    Breathing is doing well. No new co's today.   Dyspnea:  About the same on treadmill at higher wt vs one year prior Cough: none  Sleeping: able to lie flat ok  SABA use: none  02: none  Covid status:  vax x 3   Rec No change rx    06/09/2021  f/u ov/[name] re: mpns in pt with UC maint on no resp meds/ on pred for flare UC / followed by Carlean Purl   Chief Complaint  Patient presents with   Follow-up    PET scan done 06/02/21- needs to be reviewed. Pt feeling well today and denies any co's.    Dyspnea:  no longer doing treadmill but Not limited by breathing from desired activities   Cough: none  Sleeping: flat bed/ one pillow SABA use: none  02: none  Covid status:   vax x 5    No obvious day to day or daytime variability or assoc excess/ purulent sputum or mucus plugs or hemoptysis or cp or chest tightness, subjective wheeze or overt sinus or hb symptoms.   Sleeping as above without nocturnal  or early am exacerbation  of respiratory  c/o's or need for noct saba. Also denies any obvious fluctuation of symptoms with weather or environmental changes or other aggravating or alleviating factors except as outlined above   No unusual exposure hx or h/o childhood pna/ asthma or knowledge of premature birth.  Current Allergies, Complete Past Medical History, Past Surgical History,  Family History, and Social History were reviewed in [employer] record.  ROS  The following are not active complaints unless bolded Hoarseness, sore throat, dysphagia, dental problems, itching, sneezing,  nasal congestion or discharge of excess mucus or purulent secretions, ear ache,   fever, chills, sweats, unintended wt loss or wt gain, classically pleuritic or exertional cp,  orthopnea pnd or arm/hand swelling  or leg swelling, presyncope, palpitations, abdominal pain, anorexia, nausea, vomiting, diarrhea  or change in bowel habits or change in  bladder habits, change in stools or change in urine, dysuria, hematuria,  rash, arthralgias, visual complaints, headache, numbness, weakness or ataxia or problems with walking or coordination,  change in mood or  memory.        Current Meds  Medication Sig   Alcohol Swabs (B-D SINGLE USE SWABS REGULAR) PADS USE AS DIRECTED OR NEEDED   amLODipine (NORVASC) 5 MG tablet Take 1 tablet (5 mg total) by mouth at bedtime.   Ascorbic Acid (VITAMIN C) 1000 MG tablet Take 1,000 mg by mouth daily. Reported on 09/28/2015   aspirin 81 MG tablet Take 81 mg by mouth daily. Reported on 09/28/2015   atorvastatin (LIPITOR) 40 MG tablet TAKE 1 TABLET (40 MG TOTAL) BY MOUTH ONCE A WEEK.   Blood Glucose Calibration (TRUE METRIX LEVEL 1) Low SOLN Use as directed   blood glucose meter kit and supplies Dispense based on patient and insurance preference. Use up to four times daily as directed. (FOR ICD-9 250.00, 250.01).   Blood Glucose Monitoring Suppl (TRUE METRIX METER) w/Device KIT USE AS DIRECTED   Calcium Carbonate-Vitamin D (CALCIUM-VITAMIN D3 PO) Take 1 tablet by mouth daily. Reported on 09/28/2015   Cyanocobalamin (B-12 PO) 5021m daily   glucose blood (TRUE METRIX BLOOD GLUCOSE TEST) test strip Use as instructed to check blood sugar before meals.   mesalamine (LIALDA) 1.2 g EC tablet Take 2 tablets (2.4 g total) by mouth 2 (two) times daily.   metFORMIN (GLUCOPHAGE) 1000 MG tablet TAKE 1 TABLET(1000 MG) BY MOUTH TWICE DAILY WITH A MEAL   Multiple Vitamins-Minerals (CENTRUM SILVER PO) Take 1 tablet by mouth daily. Reported on 09/28/2015   Omega-3 Fatty Acids (FISH OIL) 300 MG CAPS Take 300 mg by mouth daily.   predniSONE (DELTASONE) 10 MG tablet Take 4 tablets (40 mg total) by mouth daily with breakfast for 3 days, THEN 3 tablets (30 mg total) daily with breakfast for 3 days, THEN 2 tablets (20 mg total) daily with breakfast for 14 days, THEN 1 tablet (10 mg total) daily with breakfast for 14 days, THEN 0.5 tablets  (5 mg total) daily with breakfast for 14 days.   TRUEplus Lancets 33G MISC USE TO CHECK SUGAR BEFORE MEALS 3 TIMES DAILY   ustekinumab (STELARA) 90 MG/ML SOSY injection Inject 1syringe under the skin every 8 weeks.              Objective:   Physical Exam   06/09/2021         252   08/16/2020      256   09/28/15 243 lb (110.224 kg)  09/26/15 243 lb (110.224 kg)  09/13/15 241 lb 8 oz (109.544 kg)      Vital signs reviewed  06/09/2021  - Note at rest 02 sats  96% on RA   General appearance:    pleasant amb obese bm nad   HEENT : pt wearing mask not removed for exam due to covid -19 concerns.  NECK :  without JVD/Nodes/TM/ nl carotid upstrokes bilaterally   LUNGS: no acc muscle use,  Nl contour chest which is clear to A and P bilaterally without cough on insp or exp maneuvers   CV:  RRR  no s3 or murmur or increase in P2, and no edema   ABD:  obese soft and nontender with nl inspiratory excursion in the supine position. No bruits or organomegaly appreciated, bowel sounds nl  MS:  Nl gait/ ext warm without deformities, calf tenderness, cyanosis or clubbing No obvious joint restrictions   SKIN: warm and dry without lesions    NEURO:  alert, approp, nl sensorium with  no motor or cerebellar deficits apparent.                  Assessment:

## 2021-06-12 ENCOUNTER — Other Ambulatory Visit: Payer: Self-pay

## 2021-06-12 ENCOUNTER — Ambulatory Visit (INDEPENDENT_AMBULATORY_CARE_PROVIDER_SITE_OTHER): Payer: Medicare HMO

## 2021-06-12 VITALS — BP 136/94 | HR 99 | Temp 98.1°F | Wt 251.4 lb

## 2021-06-12 DIAGNOSIS — Z0000 Encounter for general adult medical examination without abnormal findings: Secondary | ICD-10-CM | POA: Diagnosis not present

## 2021-06-12 NOTE — Progress Notes (Addendum)
Subjective:   Angel Frazier is a 66 y.o. male who presents for Medicare Annual/Subsequent preventive examination.  Review of Systems     Cardiac Risk Factors include: advanced age (>79mn, >>26women);hypertension;male gender;diabetes mellitus;dyslipidemia;obesity (BMI >30kg/m2)     Objective:    Today's Vitals   06/12/21 0927  BP: (!) 136/94  Pulse: 99  Temp: 98.1 F (36.7 C)  SpO2: 99%  Weight: 251 lb 6.4 oz (114 kg)   Body mass index is 35.06 kg/m.  Advanced Directives 06/12/2021 06/09/2020 04/16/2019 06/18/2017 09/13/2015 08/20/2015  Does Patient Have a Medical Advance Directive? No Yes No No No No  Does patient want to make changes to medical advance directive? - Yes (MAU/Ambulatory/Procedural Areas - Information given) - - - -  Would patient like information on creating a medical advance directive? Yes (MAU/Ambulatory/Procedural Areas - Information given) - Yes (MAU/Ambulatory/Procedural Areas - Information given) Yes (MAU/Ambulatory/Procedural Areas - Information given) Yes - Educational materials given No - patient declined information    Current Medications (verified) Outpatient Encounter Medications as of 06/12/2021  Medication Sig   Alcohol Swabs (B-D SINGLE USE SWABS REGULAR) PADS USE AS DIRECTED OR NEEDED   amLODipine (NORVASC) 5 MG tablet Take 1 tablet (5 mg total) by mouth at bedtime.   Ascorbic Acid (VITAMIN C) 1000 MG tablet Take 1,000 mg by mouth daily. Reported on 09/28/2015   aspirin 81 MG tablet Take 81 mg by mouth daily. Reported on 09/28/2015   atorvastatin (LIPITOR) 40 MG tablet TAKE 1 TABLET (40 MG TOTAL) BY MOUTH ONCE A WEEK.   Blood Glucose Calibration (TRUE METRIX LEVEL 1) Low SOLN Use as directed   blood glucose meter kit and supplies Dispense based on patient and insurance preference. Use up to four times daily as directed. (FOR ICD-9 250.00, 250.01).   Blood Glucose Monitoring Suppl (TRUE METRIX METER) w/Device KIT USE AS DIRECTED   Calcium  Carbonate-Vitamin D (CALCIUM-VITAMIN D3 PO) Take 1 tablet by mouth daily. Reported on 09/28/2015   Cyanocobalamin (B-12 PO) 500106mdaily   glucose blood (TRUE METRIX BLOOD GLUCOSE TEST) test strip Use as instructed to check blood sugar before meals.   mesalamine (LIALDA) 1.2 g EC tablet Take 2 tablets (2.4 g total) by mouth 2 (two) times daily.   metFORMIN (GLUCOPHAGE) 1000 MG tablet TAKE 1 TABLET(1000 MG) BY MOUTH TWICE DAILY WITH A MEAL   Multiple Vitamins-Minerals (CENTRUM SILVER PO) Take 1 tablet by mouth daily. Reported on 09/28/2015   Omega-3 Fatty Acids (FISH OIL) 300 MG CAPS Take 300 mg by mouth daily.   predniSONE (DELTASONE) 10 MG tablet Take 4 tablets (40 mg total) by mouth daily with breakfast for 3 days, THEN 3 tablets (30 mg total) daily with breakfast for 3 days, THEN 2 tablets (20 mg total) daily with breakfast for 14 days, THEN 1 tablet (10 mg total) daily with breakfast for 14 days, THEN 0.5 tablets (5 mg total) daily with breakfast for 14 days.   TRUEplus Lancets 33G MISC USE TO CHECK SUGAR BEFORE MEALS 3 TIMES DAILY   ustekinumab (STELARA) 90 MG/ML SOSY injection Inject 1syringe under the skin every 8 weeks. (Patient not taking: Reported on 06/12/2021)   No facility-administered encounter medications on file as of 06/12/2021.    Allergies (verified) Humira [adalimumab] and Mercaptopurine   History: Past Medical History:  Diagnosis Date    vitamin D deficiency 01/24/2013   01/23/13 - level 29 - supplements 50k IU weekly x 6 recommended    Anabolic steroid  abuse    PT. DENIES 03/30/19   Arthritis    Avascular necrosis of femoral head (HCC)    bilateral   Bleeding hemorrhoids    Diabetes mellitus without complication ([place]) 8416   type 2   Drug-induced acute pancreatitis - 6MP 09/07/2015   Erectile dysfunction    Hypertension    Left sided ulcerative colitis ([place])    Psoriasis caused by Humira 04/13/2019   Rhabdomyolysis 12/25/2018   Spinal stenosis of lumbar region at  multiple levels    Past Surgical History:  Procedure Laterality Date   COLONOSCOPY     multiple   KNEE ARTHROSCOPY Right    PROSTATE BIOPSY  08/2020   Negative for cancer   SIGMOIDOSCOPY     Family History  Problem Relation Age of Onset   Diabetes Mother    Breast cancer Sister    Diabetes Other        cousin   Hypertension Other    Diabetes type I Son    Colon cancer Neg Hx    Esophageal cancer Neg Hx    Rectal cancer Neg Hx    Stomach cancer Neg Hx    Colon polyps Neg Hx    Social History   Socioeconomic History   Marital status: Single    Spouse name: Not on file   Number of children: 1   Years of education: Not on file   Highest education level: Not on file  Occupational History   Occupation: retired    Fish farm manager: [employer]  Tobacco Use   Smoking status: Former    Packs/day: 1.00    Years: 6.00    Pack years: 6.00    Types: Cigarettes    Quit date: 06/04/2000    Years since quitting: 21.0   Smokeless tobacco: Never  Vaping Use   Vaping Use: Never used  Substance and Sexual Activity   Alcohol use: Yes    Comment: occasionally   Drug use: No   Sexual activity: Not Currently  Other Topics Concern   Not on file  Social History Narrative   Single never married. 1 son 5 in 2018. 1 grandson 44 in 2018. Lives in [place] alone      Disability- low back issues   Roll up machine operator      Hobbies: enjoys tinkering in his shed, enjoys going to the gym   Social Determinants of Health   Financial Resource Strain: Low Risk    Difficulty of Paying Living Expenses: Not hard at all  Food Insecurity: No Food Insecurity   Worried About Charity fundraiser in the Last Year: Never true   Arboriculturist in the Last Year: Never true  Transportation Needs: No Transportation Needs   Lack of Transportation (Medical): No   Lack of Transportation (Non-Medical): No  Physical Activity: Inactive   Days of Exercise per Week: 0 days   Minutes of  Exercise per Session: 0 min  Stress: No Stress Concern Present   Feeling of Stress : Not at all  Social Connections: Moderately Isolated   Frequency of Communication with Friends and Family: More than three times a week   Frequency of Social Gatherings with Friends and Family: More than three times a week   Attends Religious Services: More than 4 times per year   Active Member of [employer] or Organizations: No   Attends Archivist Meetings: Never   Marital Status: Never married  Tobacco Counseling Counseling given: Not Answered   Clinical Intake:  Pre-visit preparation completed: Yes  Pain : No/denies pain     BMI - recorded: 35.06 Nutritional Status: BMI > 30  Obese Nutritional Risks: None Diabetes: Yes CBG done?: Yes (134) CBG resulted in Enter/ Edit results?: No Did pt. bring in CBG monitor from home?: No  How often do you need to have someone help you when you read instructions, pamphlets, or other written materials from your doctor or pharmacy?: 1 - Never  Diabetic?Nutrition Risk Assessment:  Has the patient had any N/V/D within the last 2 months?  No  Does the patient have any non-healing wounds?  No  Has the patient had any unintentional weight loss or weight gain?  No   Diabetes:  Is the patient diabetic?  Yes  If diabetic, was a CBG obtained today?  Yes  Did the patient bring in their glucometer from home?  No  How often do you monitor your CBG's? daily.   Financial Strains and Diabetes Management:  Are you having any financial strains with the device, your supplies or your medication? No .  Does the patient want to be seen by Chronic Care Management for management of their diabetes?  No  Would the patient like to be referred to a Nutritionist or for Diabetic Management?  No   Diabetic Exams:  Diabetic Eye Exam: Completed 06/22/20 Diabetic Foot Exam: Completed 02/20/21   Interpreter Needed?: No  Information entered by :: Charlott Rakes,  LPN   Activities of Daily Living In your present state of health, do you have any difficulty performing the following activities: 06/12/2021  Hearing? N  Vision? N  Difficulty concentrating or making decisions? N  Walking or climbing stairs? Y  Comment coming down stairs  Dressing or bathing? N  Doing errands, shopping? N  Preparing Food and eating ? N  Using the Toilet? N  In the past six months, have you accidently leaked urine? N  Do you have problems with loss of bowel control? N  Managing your Medications? N  Managing your Finances? N  Housekeeping or managing your Housekeeping? N  Some recent data might be hidden    Patient Care Team: [name], MD as PCP - General (Family Medicine) Renato Shin, MD as Consulting Physician (Endocrinology) Tanda Rockers, MD as Consulting Physician (Pulmonary Disease) Gatha Mayer, MD as Consulting Physician (Gastroenterology) Edythe Clarity, [facility] (Pharmacist)  Indicate any recent Medical Services you may have received from other than [facility] providers in the past year (date may be approximate).     Assessment:   This is a routine wellness examination for Angel Frazier.  Hearing/Vision screen Hearing Screening - Comments:: Pt denies any hearing issues  Vision Screening - Comments:: [facility]   Dietary issues and exercise activities discussed: Current Exercise Habits: The patient does not participate in regular exercise at present   Goals Addressed             This Visit's Progress    Patient Stated       Lose weight        Depression Screen PHQ 2/9 Scores 06/12/2021 12/16/2020 08/09/2020 06/09/2020 09/17/2019 04/16/2019 03/04/2019  PHQ - 2 Score 0 0 0 0 0 0 0  PHQ- 9 Score - - - - 0 - 0    Fall Risk Fall Risk  06/12/2021 12/16/2020 08/09/2020 06/09/2020 12/08/2019  Falls in the past year? 0  0 0 0 0  Number falls in past yr: 0 0 0 0 0  Injury with Fall? 0 0 0 0 0  Risk for fall due to :  Impaired vision No Fall Risks - Impaired vision -  Follow up Falls prevention discussed Falls evaluation completed - Falls prevention discussed -    FALL RISK PREVENTION PERTAINING TO THE HOME:  Any stairs in or around the home? Yes  If so, are there any without handrails? No  Home free of loose throw rugs in walkways, pet beds, electrical cords, etc? Yes  Adequate lighting in your home to reduce risk of falls? Yes   ASSISTIVE DEVICES UTILIZED TO PREVENT FALLS:  Life alert? No  Use of a cane, walker or w/c? No  Grab bars in the bathroom? No  Shower chair or bench in shower? No  Elevated toilet seat or a handicapped toilet? No   TIMED UP AND GO:  Was the test performed? Yes .  Length of time to ambulate 10 feet: 10 sec.   Gait steady and fast without use of assistive device  Cognitive Function:     6CIT Screen 06/12/2021 06/09/2020 04/16/2019  What Year? 0 points 0 points 0 points  What month? 0 points 0 points 0 points  What time? 0 points - 0 points  Count back from 20 0 points 0 points 0 points  Months in reverse 0 points 0 points 0 points  Repeat phrase 4 points 4 points 0 points  Total Score 4 - 0    Immunizations Immunization History  Administered Date(s) Administered   Fluad Quad(high Dose 65+) 03/21/2020, 03/04/2021   Influenza Split 02/15/2011   Influenza, Seasonal, Injecte, Preservative Fre 05/21/2012   Influenza,inj,Quad PF,6+ Mos 06/03/2013   Moderna SARS-COV2 Booster Vaccination 11/16/2020   Moderna Sars-Covid-2 Vaccination 08/24/2019, 09/21/2019, 04/04/2020   Td 06/04/2001   Tdap 05/21/2012, 03/09/2021    TDAP status: Up to date  Flu Vaccine status: Up to date  Pneumococcal vaccine status: Due, Education has been provided regarding the importance of this vaccine. Advised may receive this vaccine at local pharmacy or Health Dept. Aware to provide a copy of the vaccination record if obtained from local pharmacy or Health Dept. Verbalized acceptance and  understanding.  Covid-19 vaccine status: Completed vaccines  Qualifies for Shingles Vaccine? Yes   Zostavax completed Yes   Shingrix Completed?: Yes  Screening Tests Health Maintenance  Topic Date Due   Pneumonia Vaccine 84+ Years old (1 - PCV) Never done   Zoster Vaccines- Shingrix (1 of 2) Never done   COVID-19 Vaccine (4 - Booster for Moderna series) 01/11/2021   OPHTHALMOLOGY EXAM  06/22/2021   HEMOGLOBIN A1C  08/20/2021   URINE MICROALBUMIN  12/16/2021   FOOT EXAM  02/20/2022   COLONOSCOPY (Pts 45-43yr Insurance coverage will need to be confirmed)  03/02/2022   TETANUS/TDAP  03/10/2031   INFLUENZA VACCINE  Completed   Hepatitis C Screening  Completed   HPV VACCINES  Aged Out    Health Maintenance  Health Maintenance Due  Topic Date Due   Pneumonia Vaccine 68 Years old (1 - PCV) Never done   Zoster Vaccines- Shingrix (1 of 2) Never done   COVID-19 Vaccine (4 - Booster for Moderna series) 01/11/2021    Colorectal cancer screening: Type of screening: Colonoscopy. Completed 03/02/21. Repeat every 1 years   Additional Screening:  Hepatitis C Screening:  Completed 09/07/16  Vision Screening: Recommended annual ophthalmology exams for early detection of glaucoma and  other disorders of the eye. Is the patient up to date with their annual eye exam?  Yes  Who is the provider or what is the name of the office in which the patient attends annual eye exams? [facility]  If pt is not established with a provider, would they like to be referred to a provider to establish care? No .   Dental Screening: Recommended annual dental exams for proper oral hygiene  Community Resource Referral / Chronic Care Management: CRR required this visit?  No   CCM required this visit?  No      Plan:     I have personally reviewed and noted the following in the patients chart:   Medical and social history Use of alcohol, tobacco or illicit drugs  Current medications and supplements  including opioid prescriptions. Patient is not currently taking opioid prescriptions. Functional ability and status Nutritional status Physical activity Advanced directives List of other physicians Hospitalizations, surgeries, and ER visits in previous 12 months Vitals Screenings to include cognitive, depression, and falls Referrals and appointments  In addition, I have reviewed and discussed with patient certain preventive protocols, quality metrics, and best practice recommendations. A written personalized care plan for preventive services as well as general preventive health recommendations were provided to patient.     Willette Brace, LPN   11/09/7371   Nurse Notes: none

## 2021-06-12 NOTE — Patient Instructions (Addendum)
Angel Frazier , Thank you for taking time to come for your Medicare Wellness Visit. I appreciate your ongoing commitment to your health goals. Please review the following plan we discussed and let me know if I can assist you in the future.   Screening recommendations/referrals: Colonoscopy: Done 03/02/21 repeat every year Recommended yearly ophthalmology/optometry visit for glaucoma screening and checkup Recommended yearly dental visit for hygiene and checkup  Vaccinations: Influenza vaccine: Done 03/04/21  repeat every year  Pneumococcal vaccine: Due and discussed  Tdap vaccine: Done 03/09/21 repeat every 10 years Shingles vaccine: Shingrix discussed. 1st dose 04/25/21  Covid-19: Completed 3/22, 4/19, 04/04/20  Advanced directives: Advance directive discussed with you today. I have provided a copy for you to complete at home and have notarized. Once this is complete please bring a copy in to our office so we can scan it into your chart.  Conditions/risks identified: lose weight   Next appointment: Follow up in one year for your annual wellness visit.   Preventive Care 68 Years and Older, Male Preventive care refers to lifestyle choices and visits with your health care provider that can promote health and wellness. What does preventive care include? A yearly physical exam. This is also called an annual well check. Dental exams once or twice a year. Routine eye exams. Ask your health care provider how often you should have your eyes checked. Personal lifestyle choices, including: Daily care of your teeth and gums. Regular physical activity. Eating a healthy diet. Avoiding tobacco and drug use. Limiting alcohol use. Practicing safe sex. Taking low doses of aspirin every day. Taking vitamin and mineral supplements as recommended by your health care provider. What happens during an annual well check? The services and screenings done by your health care provider during your annual well check  will depend on your age, overall health, lifestyle risk factors, and family history of disease. Counseling  Your health care provider may ask you questions about your: Alcohol use. Tobacco use. Drug use. Emotional well-being. Home and relationship well-being. Sexual activity. Eating habits. History of falls. Memory and ability to understand (cognition). Work and work Statistician. Screening  You may have the following tests or measurements: Height, weight, and BMI. Blood pressure. Lipid and cholesterol levels. These may be checked every 5 years, or more frequently if you are over 52 years old. Skin check. Lung cancer screening. You may have this screening every year starting at age 21 if you have a 30-pack-year history of smoking and currently smoke or have quit within the past 15 years. Fecal occult blood test (FOBT) of the stool. You may have this test every year starting at age 28. Flexible sigmoidoscopy or colonoscopy. You may have a sigmoidoscopy every 5 years or a colonoscopy every 10 years starting at age 91. Prostate cancer screening. Recommendations will vary depending on your family history and other risks. Hepatitis C blood test. Hepatitis B blood test. Sexually transmitted disease (STD) testing. Diabetes screening. This is done by checking your blood sugar (glucose) after you have not eaten for a while (fasting). You may have this done every 1-3 years. Abdominal aortic aneurysm (AAA) screening. You may need this if you are a current or former smoker. Osteoporosis. You may be screened starting at age 39 if you are at high risk. Talk with your health care provider about your test results, treatment options, and if necessary, the need for more tests. Vaccines  Your health care provider may recommend certain vaccines, such as: Influenza vaccine. This  is recommended every year. Tetanus, diphtheria, and acellular pertussis (Tdap, Td) vaccine. You may need a Td booster every 10  years. Zoster vaccine. You may need this after age 35. Pneumococcal 13-valent conjugate (PCV13) vaccine. One dose is recommended after age 46. Pneumococcal polysaccharide (PPSV23) vaccine. One dose is recommended after age 20. Talk to your health care provider about which screenings and vaccines you need and how often you need them. This information is not intended to replace advice given to you by your health care provider. Make sure you discuss any questions you have with your health care provider. Document Released: 06/17/2015 Document Revised: 02/08/2016 Document Reviewed: 03/22/2015 Elsevier Interactive Patient Education  2017 [place] Prevention in the Home Falls can cause injuries. They can happen to people of all ages. There are many things you can do to make your home safe and to help prevent falls. What can I do on the outside of my home? Regularly fix the edges of walkways and driveways and fix any cracks. Remove anything that might make you trip as you walk through a door, such as a raised step or threshold. Trim any bushes or trees on the path to your home. Use bright outdoor lighting. Clear any walking paths of anything that might make someone trip, such as rocks or tools. Regularly check to see if handrails are loose or broken. Make sure that both sides of any steps have handrails. Any raised decks and porches should have guardrails on the edges. Have any leaves, snow, or ice cleared regularly. Use sand or salt on walking paths during winter. Clean up any spills in your garage right away. This includes oil or grease spills. What can I do in the bathroom? Use night lights. Install grab bars by the toilet and in the tub and shower. Do not use towel bars as grab bars. Use non-skid mats or decals in the tub or shower. If you need to sit down in the shower, use a plastic, non-slip stool. Keep the floor dry. Clean up any water that spills on the floor as soon as it  happens. Remove soap buildup in the tub or shower regularly. Attach bath mats securely with double-sided non-slip rug tape. Do not have throw rugs and other things on the floor that can make you trip. What can I do in the bedroom? Use night lights. Make sure that you have a light by your bed that is easy to reach. Do not use any sheets or blankets that are too big for your bed. They should not hang down onto the floor. Have a firm chair that has side arms. You can use this for support while you get dressed. Do not have throw rugs and other things on the floor that can make you trip. What can I do in the kitchen? Clean up any spills right away. Avoid walking on wet floors. Keep items that you use a lot in easy-to-reach places. If you need to reach something above you, use a strong step stool that has a grab bar. Keep electrical cords out of the way. Do not use floor polish or wax that makes floors slippery. If you must use wax, use non-skid floor wax. Do not have throw rugs and other things on the floor that can make you trip. What can I do with my stairs? Do not leave any items on the stairs. Make sure that there are handrails on both sides of the stairs and use them. Fix handrails that  are broken or loose. Make sure that handrails are as long as the stairways. Check any carpeting to make sure that it is firmly attached to the stairs. Fix any carpet that is loose or worn. Avoid having throw rugs at the top or bottom of the stairs. If you do have throw rugs, attach them to the floor with carpet tape. Make sure that you have a light switch at the top of the stairs and the bottom of the stairs. If you do not have them, ask someone to add them for you. What else can I do to help prevent falls? Wear shoes that: Do not have high heels. Have rubber bottoms. Are comfortable and fit you well. Are closed at the toe. Do not wear sandals. If you use a stepladder: Make sure that it is fully opened.  Do not climb a closed stepladder. Make sure that both sides of the stepladder are locked into place. Ask someone to hold it for you, if possible. Clearly mark and make sure that you can see: Any grab bars or handrails. First and last steps. Where the edge of each step is. Use tools that help you move around (mobility aids) if they are needed. These include: Canes. Walkers. Scooters. Crutches. Turn on the lights when you go into a dark area. Replace any light bulbs as soon as they burn out. Set up your furniture so you have a clear path. Avoid moving your furniture around. If any of your floors are uneven, fix them. If there are any pets around you, be aware of where they are. Review your medicines with your doctor. Some medicines can make you feel dizzy. This can increase your chance of falling. Ask your doctor what other things that you can do to help prevent falls. This information is not intended to replace advice given to you by your health care provider. Make sure you discuss any questions you have with your health care provider. Document Released: 03/17/2009 Document Revised: 10/27/2015 Document Reviewed: 06/25/2014 Elsevier Interactive Patient Education  2017 [employer].

## 2021-06-20 ENCOUNTER — Other Ambulatory Visit (HOSPITAL_COMMUNITY): Payer: Self-pay

## 2021-06-21 ENCOUNTER — Encounter: Payer: Self-pay | Admitting: Internal Medicine

## 2021-06-21 ENCOUNTER — Ambulatory Visit: Payer: Medicare HMO | Admitting: Internal Medicine

## 2021-06-21 ENCOUNTER — Other Ambulatory Visit: Payer: Self-pay | Admitting: Internal Medicine

## 2021-06-21 VITALS — BP 148/94 | HR 100 | Ht 71.0 in | Wt 251.0 lb

## 2021-06-21 DIAGNOSIS — K51019 Ulcerative (chronic) pancolitis with unspecified complications: Secondary | ICD-10-CM | POA: Diagnosis not present

## 2021-06-21 DIAGNOSIS — E1159 Type 2 diabetes mellitus with other circulatory complications: Secondary | ICD-10-CM

## 2021-06-21 DIAGNOSIS — Z7960 Long term (current) use of unspecified immunomodulators and immunosuppressants: Secondary | ICD-10-CM

## 2021-06-21 DIAGNOSIS — I152 Hypertension secondary to endocrine disorders: Secondary | ICD-10-CM

## 2021-06-21 NOTE — Progress Notes (Signed)
Angel Frazier 66 y.o. 14-Apr-1956 [id number]  Assessment & Plan:   Encounter Diagnoses  Name Primary?   Universal ulcerative colitis with complication (HCC) Yes   Long-term use of immunosuppressant medication    Finish prednisone taper. Keep in mind that that could have raised blood pressure lately. Hopefully we will be able to find more support so he can continue Stelara.  If not options are very limited due to costs.  Return visit to be arranged pending the above plan on a routine visit within 6 months.  His TB testing is not up-to-date we will call him back to have that done  CC: [name], MD   Subjective:   Chief Complaint: Ulcerative colitis  HPI Angel Frazier is a 66 year old African-American man with a history of ulcerative colitis originally thought to be left-sided but this has progressed to universal ulcerative colitis based upon last colonoscopy findings (biopsies).  He has been maintained on mesalamine which she gets through a compassionate care program, he has had pancreatitis for mercaptopurine and he developed psoriasis from Humira.  More recently started on Stelara which he was able to obtain through a compassionate care program and is here today with his first subcutaneous injection about 8 weeks after his infusion loading dose.  He had been ill with a flare with diarrhea and he is about to finish a prednisone taper and says his stools are back to normal and he is not having any abdominal pain.  My nursing staff instructed him in how to inject the Stelara today.  Unfortunately at this point the program which helped him obtain this has been discontinued and the pharmacy staff at our infusion center is looking for additional programs.   Blood pressure has been elevated some lately and he is to keep track of that for 10 days and follow-up with Dr. Yong Channel. Allergies  Allergen Reactions   Humira [Adalimumab] Rash    psoriasis   Mercaptopurine Other (See Comments)     Caused Pancreatitis   Current Meds  Medication Sig   Alcohol Swabs (B-D SINGLE USE SWABS REGULAR) PADS USE AS DIRECTED OR NEEDED   amLODipine (NORVASC) 5 MG tablet Take 1 tablet (5 mg total) by mouth at bedtime.   Ascorbic Acid (VITAMIN C) 1000 MG tablet Take 1,000 mg by mouth daily. Reported on 09/28/2015   aspirin 81 MG tablet Take 81 mg by mouth daily. Reported on 09/28/2015   atorvastatin (LIPITOR) 40 MG tablet TAKE 1 TABLET (40 MG TOTAL) BY MOUTH ONCE A WEEK.   Blood Glucose Calibration (TRUE METRIX LEVEL 1) Low SOLN Use as directed   blood glucose meter kit and supplies Dispense based on patient and insurance preference. Use up to four times daily as directed. (FOR ICD-9 250.00, 250.01).   Blood Glucose Monitoring Suppl (TRUE METRIX METER) w/Device KIT USE AS DIRECTED   Calcium Carbonate-Vitamin D (CALCIUM-VITAMIN D3 PO) Take 1 tablet by mouth daily. Reported on 09/28/2015   Cyanocobalamin (B-12 PO) 5079m daily   glucose blood (TRUE METRIX BLOOD GLUCOSE TEST) test strip Use as instructed to check blood sugar before meals.   mesalamine (LIALDA) 1.2 g EC tablet Take 2 tablets (2.4 g total) by mouth 2 (two) times daily.   metFORMIN (GLUCOPHAGE) 1000 MG tablet TAKE 1 TABLET(1000 MG) BY MOUTH TWICE DAILY WITH A MEAL   Multiple Vitamins-Minerals (CENTRUM SILVER PO) Take 1 tablet by mouth daily. Reported on 09/28/2015   Omega-3 Fatty Acids (FISH OIL)  by mouth daily.  ° predniSONE (DELTASONE) 10 MG tablet Take 4 tablets (40 mg total) by mouth daily with breakfast for 3 days, THEN 3 tablets (30 mg total) daily with breakfast for 3 days, THEN 2 tablets (20 mg total) daily with breakfast for 14 days, THEN 1 tablet (10 mg total) daily with breakfast for 14 days, THEN 0.5 tablets (5 mg total) daily with breakfast for 14 days.  ° TRUEplus Lancets 33G MISC USE TO CHECK SUGAR BEFORE MEALS 3 TIMES DAILY  ° ustekinumab (STELARA) 90 MG/ML SOSY injection Inject 1syringe under the skin every  8 weeks.  ° °Past Medical History:  °Diagnosis Date  °  vitamin D deficiency 01/24/2013  ° 01/23/13 - level 29 - supplements 50k IU weekly x 6 recommended   ° Anabolic steroid abuse   ° PT. DENIES 03/30/19  ° Arthritis   ° Avascular necrosis of femoral head (HCC)   ° bilateral  ° Bleeding hemorrhoids   ° Diabetes mellitus without complication (HCC) 2017  ° type 2  ° Drug-induced acute pancreatitis - 6MP 09/07/2015  ° Erectile dysfunction   ° Hypertension   ° Left sided ulcerative colitis (HCC)   ° Psoriasis caused by Humira 04/13/2019  ° Rhabdomyolysis 12/25/2018  ° Spinal stenosis of lumbar region at multiple levels   ° °Past Surgical History:  °Procedure Laterality Date  ° COLONOSCOPY    ° multiple  ° KNEE ARTHROSCOPY Right   ° PROSTATE BIOPSY  08/2020  ° Negative for cancer  ° SIGMOIDOSCOPY    ° °Social History  ° °Social History Narrative  ° Single never married. 1 son 44 in 2018. 1 grandson 11 in 2018. Lives in [place]  ° Lives alone  °   ° Disability- low back issues  ° Roll up machine operator  °   ° Hobbies: enjoys tinkering in his shed, enjoys going to the gym  ° °family history includes Breast cancer in his sister; Diabetes in his mother and another family member; Diabetes type I in his son; Hypertension in an other family member. ° ° °Review of Systems ° ° °Objective:  ° Physical Exam °BP (!) 148/94    Pulse 100    Ht 5' 11" (1.803 m)    Wt 251 lb (113.9 kg)    BMI 35.01 kg/m²  ° ° °

## 2021-06-21 NOTE — Patient Instructions (Addendum)
Great to see you today!  If you are age 68 or older, your body mass index should be between 23-30. Your Body mass index is 35.01 kg/m. If this is out of the aforementioned range listed, please consider follow up with your Primary Care Provider.  If you are age 83 or younger, your body mass index should be between 19-25. Your Body mass index is 35.01 kg/m. If this is out of the aformentioned range listed, please consider follow up with your Primary Care Provider.   ________________________________________________________  The [facility] [place] [facility] providers would like to encourage you to use [facility] to communicate with providers for non-urgent requests or questions.  Due to long hold times on the telephone, sending your provider a message by [facility] may be a faster and more efficient way to get a response.  Please allow 48 business hours for a response.  Please remember that this is for non-urgent requests.  _______________________________________________________  We will work on the [employer] as soon as we receive it.   I appreciate the opportunity to care for you. Silvano Rusk, MD, [facility]

## 2021-06-22 ENCOUNTER — Other Ambulatory Visit: Payer: Medicare HMO

## 2021-06-22 ENCOUNTER — Telehealth: Payer: Self-pay

## 2021-06-22 DIAGNOSIS — Z7960 Long term (current) use of unspecified immunomodulators and immunosuppressants: Secondary | ICD-10-CM

## 2021-06-22 NOTE — Telephone Encounter (Signed)
I have faxed the Bernita Buffy form to fax # [phone] for patient assistance for [facility]. I got confirmation it went thru. He is in urgent need of his medicine. This is a renewal for his medicine.

## 2021-06-23 NOTE — Telephone Encounter (Signed)
Forms were scanned into epic today.

## 2021-06-26 ENCOUNTER — Telehealth: Payer: Self-pay

## 2021-06-26 NOTE — Telephone Encounter (Signed)
1120 on Tuesday can work him in-tell him may be wait-bring a copy of home blood pressure logs

## 2021-06-26 NOTE — Telephone Encounter (Signed)
Patient informed that his assistance has been approved in the [employer] [employer] Patient Assistance Program thru 06/03/22.

## 2021-06-26 NOTE — Telephone Encounter (Signed)
Please schedule. Please see note below.

## 2021-06-26 NOTE — Telephone Encounter (Signed)
States BP has been running around 130-140/100.  Denies any symptoms.  States he was informed by Dr. Yong Channel to call to schedule appt if BP does not lower.  Next same day opening is almost 3 weeks out.  Do we want to work patient in sooner?

## 2021-06-27 ENCOUNTER — Encounter: Payer: Self-pay | Admitting: Internal Medicine

## 2021-06-27 LAB — QUANTIFERON-TB GOLD PLUS
Mitogen-NIL: >10 IU/mL
NIL: 0.04 IU/mL
QuantiFERON-TB Gold Plus: NEGATIVE
TB1-NIL: 0.01 IU/mL
TB2-NIL: 0.01 IU/mL

## 2021-06-27 NOTE — Telephone Encounter (Signed)
LVM about getting scheduled.

## 2021-06-27 NOTE — Telephone Encounter (Signed)
F/u note: Angel Frazier. Patient BIV has been completed by [facility].  Ranelle Oyster will reach out to patient for a welcome phone call and questions.  Left v/m with  patient to inform him Ranelle Oyster will need to speak with him to move forward with enrollement. Rep# [id number] PHONE: [phone]

## 2021-07-03 NOTE — Progress Notes (Signed)
Phone [phone] In person visit   Subjective:   Angel Frazier is a 66 y.o. year old very pleasant male patient who presents for/with See problem oriented charting Chief Complaint  Patient presents with   Follow-up   Hypertension    Pt states his bp has been elevated, he has been checking it at home but does not have any readings with.   Hyperlipidemia   Diabetes    This visit occurred during the SARS-CoV-2 public health emergency.  Safety protocols were in place, including screening questions prior to the visit, additional usage of staff PPE, and extensive cleaning of exam room while observing appropriate contact time as indicated for disinfecting solutions.   Past Medical History-  Patient Active Problem List   Diagnosis Date Noted   Diabetes mellitus type II, controlled ([place]) 12/22/2018    Priority: High   Long-term use of immunosuppressant medication Stelara 07/12/2015    Priority: High   Universal ulcerative colitis ([place]) 08/11/2008    Priority: High   Hyperlipidemia associated with type 2 diabetes mellitus ([place]) 01/20/2019    Priority: Medium    Hypertension associated with diabetes ([place]) 01/20/2019    Priority: Medium    Pulmonary nodules 09/07/2015    Priority: Medium    Former smoker 09/17/2019    Priority: Low   Long term current use of systemic steroids 11/18/2014    Priority: Low   History of avascular necrosis  11/18/2014    Priority: Low   Vitamin D deficiency 01/24/2013    Priority: Low   ED (erectile dysfunction) 05/21/2012    Priority: Low   Psoriasis caused by Humira 04/13/2019    Medications- reviewed and updated Current Outpatient Medications  Medication Sig Dispense Refill   Alcohol Swabs (B-D SINGLE USE SWABS REGULAR) PADS USE AS DIRECTED OR NEEDED 300 each 12   amLODipine (NORVASC) 5 MG tablet Take 1 tablet (5 mg total) by mouth at bedtime. 90 tablet 3   Ascorbic Acid (VITAMIN C) 1000 MG tablet Take 1,000 mg by mouth daily. Reported on  09/28/2015     aspirin 81 MG tablet Take 81 mg by mouth daily. Reported on 09/28/2015     atorvastatin (LIPITOR) 40 MG tablet TAKE 1 TABLET (40 MG TOTAL) BY MOUTH ONCE A WEEK. 13 tablet 3   Blood Glucose Calibration (TRUE METRIX LEVEL 1) Low SOLN Use as directed 1 each 1   blood glucose meter kit and supplies Dispense based on patient and insurance preference. Use up to four times daily as directed. (FOR ICD-9 250.00, 250.01). 1 each 0   Blood Glucose Monitoring Suppl (TRUE METRIX METER) w/Device KIT USE AS DIRECTED 1 kit 1   Calcium Carbonate-Vitamin D (CALCIUM-VITAMIN D3 PO) Take 1 tablet by mouth daily. Reported on 09/28/2015     Cyanocobalamin (B-12 PO) 5074m daily     glucose blood (TRUE METRIX BLOOD GLUCOSE TEST) test strip Use as instructed to check blood sugar before meals. 300 each 12   mesalamine (LIALDA) 1.2 g EC tablet Take 2 tablets (2.4 g total) by mouth 2 (two) times daily. 360 tablet 3   metFORMIN (GLUCOPHAGE) 1000 MG tablet TAKE 1 TABLET(1000 MG) BY MOUTH TWICE DAILY WITH A MEAL 180 tablet 3   Multiple Vitamins-Minerals (CENTRUM SILVER PO) Take 1 tablet by mouth daily. Reported on 09/28/2015     Omega-3 Fatty Acids (FISH OIL) 300 MG CAPS Take 300 mg by mouth daily.     TRUEplus Lancets 33G MISC USE TO CHECK SUGAR  BEFORE MEALS 3 TIMES DAILY 300 each 0   ustekinumab (STELARA) 90 MG/ML SOSY injection Inject 1syringe under the skin every 8 weeks. 1 mL 6   No current facility-administered medications for this visit.     Objective:  BP 140/82    Pulse 92    Temp (!) 97.5 F (36.4 C)    Ht 5' 11"  (1.803 m)    Wt 257 lb (116.6 kg)    SpO2 97%    BMI 35.84 kg/m  Gen: NAD, resting comfortably CV: RRR no murmurs rubs or gallops Lungs: CTAB no crackles, wheeze, rhonchi Abdomen: soft/nontender/nondistended/normal bowel sounds. No rebound or guarding.  Ext: trace edema Skin: warm, dry N    Assessment and Plan    #hypertension S: medication: amlodipine 5 mg at bedtime Home  readings #s: fluctuates at home around 140/100 -weight up 19 lbs since September- has not been exercising.  BP Readings from Last 3 Encounters:  07/04/21 140/82  06/21/21 (!) 148/94  06/12/21 (!) 136/94  A/P: Blood pressure poorly controlled at home (in particular) and in office-would prefer for it to be less than 135/85 on average. - Continue amlodipine 5 mg at bedtime - Add hydrochlorothiazide 12.5 mg in the morning - Keep follow-up in March and we will recheck blood pressure as well as kidney function - If blood pressure gets really low as you begin to lose weight and restart exercise such as under 110/65 please let me know  Recommended follow up: Return for next already scheduled visit.  In March Future Appointments  Date Time Provider [place]  08/10/2021  8:20 AM [name], MD [facility]  08/21/2021  9:00 AM Renato Shin, MD [facility] None  09/12/2021  2:00 PM [facility] CCM PHARMACIST [facility]  06/25/2022  9:30 AM [facility] HEALTH COACH [facility]    Lab/Order associations:   ICD-10-CM   1. Primary hypertension  I10       Meds ordered this encounter  Medications   hydrochlorothiazide (HYDRODIURIL) 12.5 MG tablet    Sig: Take 1 tablet (12.5 mg total) by mouth daily.    Dispense:  30 tablet    Refill:  5   I,Jada Bradford,acting as a scribe for [name], MD.,have documented all relevant documentation on the behalf of [name], MD,as directed by  [name], MD while in the presence of [name], MD.   I, [name], MD, have reviewed all documentation for this visit. The documentation on 07/04/21 for the exam, diagnosis, procedures, and orders are all accurate and complete.   Return precautions advised.  [name], MD

## 2021-07-03 NOTE — Telephone Encounter (Addendum)
[facility] [phone] Stelara SQ 74m q8 weeks - patient will self-inject. Medication will be mailed to patients home. Date: 06/21/21 - 06/03/22 Id# [id number] last injection 06/21/21 was done @ [facility]. Next injection should be 08/16/21  Patient will need to re-enroll every year.

## 2021-07-04 ENCOUNTER — Encounter: Payer: Self-pay | Admitting: Family Medicine

## 2021-07-04 ENCOUNTER — Other Ambulatory Visit: Payer: Self-pay

## 2021-07-04 ENCOUNTER — Ambulatory Visit (INDEPENDENT_AMBULATORY_CARE_PROVIDER_SITE_OTHER): Payer: Medicare HMO | Admitting: Family Medicine

## 2021-07-04 VITALS — BP 140/82 | HR 92 | Temp 97.5°F | Ht 71.0 in | Wt 257.0 lb

## 2021-07-04 DIAGNOSIS — I10 Essential (primary) hypertension: Secondary | ICD-10-CM | POA: Diagnosis not present

## 2021-07-04 DIAGNOSIS — E119 Type 2 diabetes mellitus without complications: Secondary | ICD-10-CM

## 2021-07-04 DIAGNOSIS — K5150 Left sided colitis without complications: Secondary | ICD-10-CM

## 2021-07-04 DIAGNOSIS — E1169 Type 2 diabetes mellitus with other specified complication: Secondary | ICD-10-CM

## 2021-07-04 MED ORDER — HYDROCHLOROTHIAZIDE 12.5 MG PO TABS: 12.5000 mg | ORAL_TABLET | Freq: Every day | ORAL | 5 refills | Status: DC

## 2021-07-04 NOTE — Patient Instructions (Addendum)
Blood pressure slightly high-would prefer for it to be less than 135/85 on average. - Continue amlodipine 5 mg at bedtime - Add hydrochlorothiazide 12.5 mg in the morning - Keep follow-up in March and we will recheck blood pressure as well as kidney function - If blood pressure gets really low as you begin to lose weight and restart exercise such as under 110/65 please let me know  Recommended follow up: Return for next already scheduled visit. In march

## 2021-07-07 ENCOUNTER — Other Ambulatory Visit (HOSPITAL_COMMUNITY): Payer: Self-pay

## 2021-07-10 ENCOUNTER — Other Ambulatory Visit (HOSPITAL_COMMUNITY): Payer: Self-pay

## 2021-07-11 ENCOUNTER — Other Ambulatory Visit (HOSPITAL_COMMUNITY): Payer: Self-pay

## 2021-07-31 ENCOUNTER — Other Ambulatory Visit (HOSPITAL_COMMUNITY): Payer: Self-pay

## 2021-08-02 ENCOUNTER — Other Ambulatory Visit (HOSPITAL_COMMUNITY): Payer: Self-pay

## 2021-08-02 NOTE — Progress Notes (Signed)
Phone: [phone]   Subjective:  Patient presents today for their annual physical. Chief complaint-noted.   See problem oriented charting- ROS- full  review of systems was completed and negative  per full ROS sheet- no changes at least in baseline health  The following were reviewed and entered/updated in epic: Past Medical History:  Diagnosis Date    vitamin D deficiency 01/24/2013   01/23/13 - level 29 - supplements 50k IU weekly x 6 recommended    Anabolic steroid abuse    PT. DENIES 03/30/19   Arthritis    Avascular necrosis of femoral head ([place])    bilateral   Bleeding hemorrhoids    Diabetes mellitus without complication ([place]) 9735   type 2   Drug-induced acute pancreatitis - 6MP 09/07/2015   Erectile dysfunction    Hypertension    Left sided ulcerative colitis ([place])    Psoriasis caused by Humira 04/13/2019   Rhabdomyolysis 12/25/2018   Spinal stenosis of lumbar region at multiple levels    Patient Active Problem List   Diagnosis Date Noted   Diabetes mellitus type II, controlled ([place]) 12/22/2018    Priority: High   Long-term use of immunosuppressant medication Stelara 07/12/2015    Priority: High   Universal ulcerative colitis ([place]) 08/11/2008    Priority: High   Hyperlipidemia associated with type 2 diabetes mellitus ([place]) 01/20/2019    Priority: Medium    Hypertension associated with diabetes ([place]) 01/20/2019    Priority: Medium    Pulmonary nodules 09/07/2015    Priority: Medium    Former smoker 09/17/2019    Priority: Low   Long term current use of systemic steroids 11/18/2014    Priority: Low   History of avascular necrosis  11/18/2014    Priority: Low   Vitamin D deficiency 01/24/2013    Priority: Low   ED (erectile dysfunction) 05/21/2012    Priority: Low   Psoriasis caused by Humira 04/13/2019   Past Surgical History:  Procedure Laterality Date   COLONOSCOPY     multiple   KNEE ARTHROSCOPY Right    PROSTATE BIOPSY  08/2020   Negative for  cancer   SIGMOIDOSCOPY      Family History  Problem Relation Age of Onset   Diabetes Mother    Breast cancer Sister    Diabetes Other        cousin   Hypertension Other    Diabetes type I Son    Colon cancer Neg Hx    Esophageal cancer Neg Hx    Rectal cancer Neg Hx    Stomach cancer Neg Hx    Colon polyps Neg Hx     Medications- reviewed and updated Current Outpatient Medications  Medication Sig Dispense Refill   Alcohol Swabs (B-D SINGLE USE SWABS REGULAR) PADS USE AS DIRECTED OR NEEDED 300 each 12   amLODipine (NORVASC) 5 MG tablet Take 1 tablet (5 mg total) by mouth at bedtime. 90 tablet 3   Ascorbic Acid (VITAMIN C) 1000 MG tablet Take 1,000 mg by mouth daily. Reported on 09/28/2015     aspirin 81 MG tablet Take 81 mg by mouth daily. Reported on 09/28/2015     atorvastatin (LIPITOR) 40 MG tablet TAKE 1 TABLET (40 MG TOTAL) BY MOUTH ONCE A WEEK. 13 tablet 3   Blood Glucose Calibration (TRUE METRIX LEVEL 1) Low SOLN Use as directed 1 each 1   blood glucose meter kit and supplies Dispense based on patient and insurance preference. Use up to four  times daily as directed. (FOR ICD-9 250.00, 250.01). 1 each 0   Blood Glucose Monitoring Suppl (TRUE METRIX METER) w/Device KIT USE AS DIRECTED 1 kit 1   Calcium Carbonate-Vitamin D (CALCIUM-VITAMIN D3 PO) Take 1 tablet by mouth daily. Reported on 09/28/2015     Cyanocobalamin (B-12 PO) 5043m daily     glucose blood (TRUE METRIX BLOOD GLUCOSE TEST) test strip Use as instructed to check blood sugar before meals. 300 each 12   hydrochlorothiazide (HYDRODIURIL) 12.5 MG tablet Take 1 tablet (12.5 mg total) by mouth daily. 30 tablet 5   mesalamine (LIALDA) 1.2 g EC tablet Take 2 tablets (2.4 g total) by mouth 2 (two) times daily. 360 tablet 3   metFORMIN (GLUCOPHAGE) 1000 MG tablet TAKE 1 TABLET(1000 MG) BY MOUTH TWICE DAILY WITH A MEAL 180 tablet 3   Multiple Vitamins-Minerals (CENTRUM SILVER PO) Take 1 tablet by mouth daily. Reported on  09/28/2015     Omega-3 Fatty Acids (FISH OIL) 300 MG CAPS Take 300 mg by mouth daily.     TRUEplus Lancets 33G MISC USE TO CHECK SUGAR BEFORE MEALS 3 TIMES DAILY 300 each 0   ustekinumab (STELARA) 90 MG/ML SOSY injection Inject 1syringe under the skin every 8 weeks. 1 mL 6   No current facility-administered medications for this visit.    Allergies-reviewed and updated Allergies  Allergen Reactions   Humira [Adalimumab] Rash    psoriasis   Mercaptopurine Other (See Comments)    Caused Pancreatitis    Social History   Social History Narrative   Single never married. 1 son 441in 2018. 1 grandson 131in 2018. 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 Oropharynx normal Neck: no thyromegaly CV: RRR no murmurs rubs or gallops Lungs: CTAB no crackles, wheeze, rhonchi Abdomen: soft/nontender/nondistended/normal bowel sounds. No rebound or guarding.  Ext: no edema Skin: warm, dry Neuro: grossly normal, moves all extremities, PERRLA   Assessment and Plan  66y.o. male presenting for annual physical.  Health Maintenance counseling: 1. Anticipatory guidance: Patient counseled regarding regular dental exams-needs to get this scheduled- we discussed  again today and advised q6 months- states "in the making", eye exams -yearly- plans for today,  avoiding smoking and second hand smoke , limiting alcohol to 2 beverages per day.  No illicit drugs 2. Risk factor reduction:  Advised patient of need for regular exercise and diet rich and fruits and vegetables to reduce risk of heart attack and stroke.  Exercise-  doing treadmill and biking once or twice a  week- most recent week up to 4 days! Congratulated him.  Diet/weight management--down 8 lbs from January- down 2 lbs from last CPE- has made some healthy changes- reducec snacking Wt Readings from Last 3 Encounters:  08/10/21 249 lb 9.6 oz (113.2 kg)  07/04/21 257 lb (116.6 kg)  06/21/21 251 lb (113.9 kg)   3. Immunizations/screenings/ancillary studies DISCUSSED:  -COVID booster vaccine #4- had in November- team will log -Prevnar-20 vaccine #  1- wants to hold off for now -Shingrix vaccine #1- had at pharmacy - tolerated flu shot this year better than in past Immunization History  Administered Date(s) Administered   Fluad Quad(high Dose 65+) 03/21/2020, 03/04/2021   Influenza Split 02/15/2011   Influenza, Seasonal, Injecte, Preservative Fre 05/21/2012   Influenza,inj,Quad PF,6+ Mos 06/03/2013   Moderna Covid-19 Vaccine Bivalent Booster 50yr & up 04/19/2021   Moderna SARS-COV2 Booster Vaccination 11/16/2020   Moderna Sars-Covid-2 Vaccination 08/24/2019, 09/21/2019, 04/04/2020   Td 06/04/2001   Tdap 05/21/2012, 03/09/2021  4. Prostate cancer screening-  patient with MRI of the prostate 07/16/2020-following with Dr. WJeffie Pollock looks like has biopsy planned in April that was benign. We will trend psa with labs - low risk prior trend Lab Results  Component Value Date   PSA 0.76 09/17/2019   PSA 0.90 12/04/2018   PSA 1.17 09/11/2017   5. Colon cancer screening - 03/02/21 with 1 year repeat planned -due to UC- may change depending on how hes feeling 6. Skin cancer screening- patient has seen dermatology due to psoriasis worse on hands from Humira- later started on stelara. advised regular sunscreen use- prefers to cover up. Denies worrisome, changing, or new skin lesions.  7. Smoking associated screening (lung cancer screening, AAA screen 65-75, UA)- former smoker-patient quit smoking in 2002.  UAs with urology now. -CT abdomen and pelvis from August 19, 2015 without mention of aneurysm-do not need  to repeat evaluation with AAA scan 8. STD screening -monogamous with GF - wants to hold off  Status of chronic or acute concerns   #social update- considering cruise later this year!  #hypertension S: medication: amlodipine 5 mg, hctz 12.5 mg- #s improved within a few weeks of starting last visti Home readings #s: home #s often 140s/90s but 130-150/80s-90s for most part BP Readings from Last 3 Encounters:  08/10/21 110/80  07/04/21 140/82  06/21/21 (!) 148/94  A/P: poor control at home but looks really good today/consider controlled until we evaluate home cuff- we will continue current meds and have him bring cuff to next visit to compare  #hyperlipidemia S: Medication:Atorvastatin 40 mg weekly Lab Results  Component Value Date   CHOL 102 08/09/2020   HDL 31.10 (L) 08/09/2020   LDLCALC 58 08/09/2020   LDLDIRECT 64.0 12/08/2019   TRIG 63.0 08/09/2020   CHOLHDL 3 08/09/2020   A/P: hopefully stable- update LDL today. Continue current meds for now   # Diabetes-follows with endocrinology with A1c typically under 7 S: Medication:Metformin 1000 mg twice daily - was bale to come off of-  repaglinide 0.5 mg with supper Lab Results  Component Value Date   HGBA1C 5.6 02/20/2021   HGBA1C 6.4 (A) 08/19/2020   HGBA1C 7.1 (H) 08/09/2020    A/P: looked great last visit- prefers to do POC check with Dr. ELoanne Drillingin 11 days  #Ulcerative colitis-follows with GI S: Medication: Lialda -humira lead to psoriasis A/P: no abdominal cramping or blood in stool- doing well lately and had colonoscopy within 6 months   #Vitamin D deficiency S: Medication: 1000 units Last vitamin D Lab Results  Component Value Date   VD25OH 60.19 08/09/2020  A/P: well controlled last visit- update today   #Pulmonary nodule follows with Dr. WMelvyn Novas-patient had regular follow-up with pulmonology with CT scan-Patient ended up needing PET scan June 09, 2020 and ultimately the most dangerous area was noted to be  within his prostate and was referred to urology - then had another Pet CT 06/01/21 with plan  for 6 month follow up- possible chance of cancer. Some hypermetabolic activity on the prostate again- urology is aware per patient  Recommended follow up: No follow-ups on file. Future Appointments  Date Time Provider [place]  08/21/2021  9:00 AM Renato Shin, MD [facility] None  09/12/2021  2:00 PM [facility] CCM PHARMACIST [facility]  06/25/2022  9:30 AM [facility] HEALTH COACH [facility]   Lab/Order associations: fasting   ICD-10-CM   1. Preventative health care  Z00.00     2. Primary hypertension  I10 CBC with Differential/Platelet    Comprehensive metabolic panel    Lipid panel    3. Hyperlipidemia associated with type 2 diabetes mellitus (H[facility])  E11.69 CBC with Differential/Platelet   E78.5 Comprehensive metabolic panel    Lipid panel    4. Controlled type 2 diabetes mellitus without complication, without long-term current use of insulin ([facility])  E11.9 CBC with Differential/Platelet    Comprehensive metabolic panel    Lipid panel    5. LEFT SIDED ULCERATIVE COLITIS  K51.50     6. Vitamin D deficiency  E55.9 VITAMIN D 25 Hydroxy (Vit-D Deficiency, Fractures)    7. Nocturia  R35.1 PSA      No orders of the defined types were placed in this encounter.   I,Jada Bradford,acting as a scribe for [name], MD.,have documented all relevant documentation on the behalf of [name], MD,as directed by  [name], MD while in the presence of [name], MD.  I, [name], MD, have reviewed all documentation for this visit. The documentation on 08/10/21 for the exam, diagnosis, procedures, and orders are all accurate and complete.   Return precautions advised.  [name], MD

## 2021-08-10 ENCOUNTER — Other Ambulatory Visit: Payer: Self-pay

## 2021-08-10 ENCOUNTER — Encounter: Payer: Self-pay | Admitting: Family Medicine

## 2021-08-10 ENCOUNTER — Ambulatory Visit (INDEPENDENT_AMBULATORY_CARE_PROVIDER_SITE_OTHER): Payer: Medicare HMO | Admitting: Family Medicine

## 2021-08-10 VITALS — BP 110/80 | HR 76 | Temp 97.6°F | Ht 71.0 in | Wt 249.6 lb

## 2021-08-10 DIAGNOSIS — K5150 Left sided colitis without complications: Secondary | ICD-10-CM | POA: Diagnosis not present

## 2021-08-10 DIAGNOSIS — E785 Hyperlipidemia, unspecified: Secondary | ICD-10-CM | POA: Diagnosis not present

## 2021-08-10 DIAGNOSIS — E559 Vitamin D deficiency, unspecified: Secondary | ICD-10-CM

## 2021-08-10 DIAGNOSIS — E1169 Type 2 diabetes mellitus with other specified complication: Secondary | ICD-10-CM | POA: Diagnosis not present

## 2021-08-10 DIAGNOSIS — I10 Essential (primary) hypertension: Secondary | ICD-10-CM | POA: Diagnosis not present

## 2021-08-10 DIAGNOSIS — Z0000 Encounter for general adult medical examination without abnormal findings: Secondary | ICD-10-CM | POA: Diagnosis not present

## 2021-08-10 DIAGNOSIS — E119 Type 2 diabetes mellitus without complications: Secondary | ICD-10-CM

## 2021-08-10 DIAGNOSIS — R351 Nocturia: Secondary | ICD-10-CM

## 2021-08-10 LAB — COMPREHENSIVE METABOLIC PANEL
ALT: 42 U/L (ref 0–53)
AST: 62 U/L — ABNORMAL HIGH (ref 0–37)
Albumin: 4.3 g/dL (ref 3.5–5.2)
Alkaline Phosphatase: 33 U/L — ABNORMAL LOW (ref 39–117)
BUN: 20 mg/dL (ref 6–23)
CO2: 24 mEq/L (ref 19–32)
Calcium: 9.3 mg/dL (ref 8.4–10.5)
Chloride: 104 mEq/L (ref 96–112)
Creatinine, Ser: 0.96 mg/dL (ref 0.40–1.50)
GFR: 81.87 mL/min (ref >60.00)
Glucose, Bld: 109 mg/dL — ABNORMAL HIGH (ref 70–99)
Potassium: 3.9 mEq/L (ref 3.5–5.1)
Sodium: 141 mEq/L (ref 135–145)
Total Bilirubin: 0.9 mg/dL (ref 0.2–1.2)
Total Protein: 7 g/dL (ref 6.0–8.3)

## 2021-08-10 LAB — CBC WITH DIFFERENTIAL/PLATELET
Basophils Absolute: 0 10*3/uL (ref 0.0–0.1)
Basophils Relative: 0.2 % (ref 0.0–3.0)
Eosinophils Absolute: 0.2 10*3/uL (ref 0.0–0.7)
Eosinophils Relative: 4.8 % (ref 0.0–5.0)
HCT: 39.7 % (ref 39.0–52.0)
Hemoglobin: 13.5 g/dL (ref 13.0–17.0)
Lymphocytes Relative: 34.3 % (ref 12.0–46.0)
Lymphs Abs: 1.7 10*3/uL (ref 0.7–4.0)
MCHC: 34.1 g/dL (ref 30.0–36.0)
MCV: 94.2 fl (ref 78.0–100.0)
Monocytes Absolute: 0.6 10*3/uL (ref 0.1–1.0)
Monocytes Relative: 13.1 % — ABNORMAL HIGH (ref 3.0–12.0)
Neutro Abs: 2.3 10*3/uL (ref 1.4–7.7)
Neutrophils Relative %: 47.6 % (ref 43.0–77.0)
Platelets: 194 10*3/uL (ref 150.0–400.0)
RBC: 4.21 Mil/uL — ABNORMAL LOW (ref 4.22–5.81)
RDW: 13.6 % (ref 11.5–15.5)
WBC: 4.9 10*3/uL (ref 4.0–10.5)

## 2021-08-10 LAB — LIPID PANEL
Cholesterol: 115 mg/dL (ref 0–200)
HDL: 41.2 mg/dL (ref >39.00)
LDL Cholesterol: 64 mg/dL (ref 0–99)
NonHDL: 74.19
Total CHOL/HDL Ratio: 3
Triglycerides: 53 mg/dL (ref 0.0–149.0)
VLDL: 10.6 mg/dL (ref 0.0–40.0)

## 2021-08-10 LAB — VITAMIN D 25 HYDROXY (VIT D DEFICIENCY, FRACTURES): VITD: 56.22 ng/mL (ref 30.00–100.00)

## 2021-08-10 LAB — PSA: PSA: 0.91 ng/mL (ref 0.10–4.00)

## 2021-08-10 NOTE — Patient Instructions (Addendum)
Please schedule eye exam and have it faxed to [place] at [phone].  ? ?Get [place] the dates of your shingrix shot and send to [place] if we were not able to locate dates ? ?Bring cuff to next visit so we can compare- #s looked really good today on blood pressure but sound slightly high at home ?-if blood pressure trends up at home see [place] sooner. Still prefer <135/85 but #s looked great today 110/80 ? ?You declined pneumonia shot for now ? ?Please stop by lab before you go ?If you have mychart- we will send your results within 3 business days of [place] receiving them.  ?If you do not have mychart- we will call you about results within 5 business days of [place] receiving them.  ?*please also note that you will see labs on mychart as soon as they post. I will later go in and write notes on them- will say "notes from Dr. Yong Channel"  ? ? ?Recommended follow up: Return in about 6 months (around 02/10/2022) for follow up- or sooner if needed.  ?

## 2021-08-11 DIAGNOSIS — Z0100 Encounter for examination of eyes and vision without abnormal findings: Secondary | ICD-10-CM | POA: Diagnosis not present

## 2021-08-11 DIAGNOSIS — E119 Type 2 diabetes mellitus without complications: Secondary | ICD-10-CM | POA: Diagnosis not present

## 2021-08-21 ENCOUNTER — Encounter: Payer: Self-pay | Admitting: Endocrinology

## 2021-08-21 ENCOUNTER — Other Ambulatory Visit: Payer: Self-pay

## 2021-08-21 ENCOUNTER — Ambulatory Visit: Payer: Medicare HMO | Admitting: Endocrinology

## 2021-08-21 VITALS — BP 138/96 | HR 88 | Wt 254.6 lb

## 2021-08-21 DIAGNOSIS — E119 Type 2 diabetes mellitus without complications: Secondary | ICD-10-CM | POA: Diagnosis not present

## 2021-08-21 LAB — POCT GLYCOSYLATED HEMOGLOBIN (HGB A1C): Hemoglobin A1C: 6.2 % — AB (ref 4.0–5.6)

## 2021-08-21 NOTE — Patient Instructions (Addendum)
Your blood pressure is high today.  Please see your primary care provider soon, to have it rechecked.   ?check your blood sugar once a day.  vary the time of day when you check, between before the 3 meals, and at bedtime.  also check if you have symptoms of your blood sugar being too high or too low.  please keep a record of the readings and bring it to your next appointment here (or you can bring the meter itself).  You can write it on any piece of paper.  please call us sooner if your blood sugar goes below 70, or if you have a lot of readings over 200.   ?Please continue the same metformin.   ?Please come back for a follow-up appointment in 6 months.   ?

## 2021-08-21 NOTE — Progress Notes (Signed)
? ?Subjective:  ? ? Patient ID: Angel Frazier, male    DOB: 1956/05/23, 66 y.o.   MRN: [id number] ? ?HPI ?Pt returns for f/u of diabetes mellitus: ?DM type: 1, in partial remission (intermittently ketosis-prone).   ?Dx'ed: 2017 ?Complications: none.   ?Therapy: 2 oral meds.    ?DKA: once, at dx ?Severe hypoglycemia: never.   ?Pancreatitis: never.   ?Pancreatic imaging: normal on 2017 CT ?SDOH: he could not afford Rybelsus.   ?Other: he has been in remission since 2017. He took insulin for a brief time after dx; edema limits rx options.   ?Interval history: pt says cbg's are well-controlled.  He takes meds as rx'ed.  pt states he feels well in general.   ?Past Medical History:  ?Diagnosis Date  ?  vitamin D deficiency 01/24/2013  ? 01/23/13 - level 29 - supplements 50k IU weekly x 6 recommended   ? Anabolic steroid abuse   ? PT. DENIES 03/30/19  ? Arthritis   ? Avascular necrosis of femoral head ([place])   ? bilateral  ? Bleeding hemorrhoids   ? Diabetes mellitus without complication ([place]) 3976  ? type 2  ? Drug-induced acute pancreatitis - 6MP 09/07/2015  ? Erectile dysfunction   ? Hypertension   ? Left sided ulcerative colitis ([place])   ? Psoriasis caused by Humira 04/13/2019  ? Rhabdomyolysis 12/25/2018  ? Spinal stenosis of lumbar region at multiple levels   ? ? ?Past Surgical History:  ?Procedure Laterality Date  ? COLONOSCOPY    ? multiple  ? KNEE ARTHROSCOPY Right   ? PROSTATE BIOPSY  08/2020  ? Negative for cancer  ? SIGMOIDOSCOPY    ? ? ?Social History  ? ?Socioeconomic History  ? Marital status: Single  ?  Spouse name: Not on file  ? Number of children: 1  ? Years of education: Not on file  ? Highest education level: Not on file  ?Occupational History  ? Occupation: retired  ?  Employer: [employer]  ?Tobacco Use  ? Smoking status: Former  ?  Packs/day: 1.00  ?  Years: 6.00  ?  Pack years: 6.00  ?  Types: Cigarettes  ?  Quit date: 06/04/2000  ?  Years since quitting: 21.2  ? Smokeless tobacco: Never  ?Vaping  Use  ? Vaping Use: Never used  ?Substance and Sexual Activity  ? Alcohol use: Yes  ?  Comment: occasionally  ? Drug use: No  ? Sexual activity: Not Currently  ?Other Topics Concern  ? Not on file  ?Social History Narrative  ? Single never married. 1 son 31 in 2018. 1 grandson 47 in 2018. Lives in [place]  ? Lives alone  ?   ? 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   ? Hobbies: enjoys tinkering in his shed, enjoys going to the gym  ? ?Social Determinants of Health  ? ?Financial Resource Strain: Low Risk   ? Difficulty of Paying Living Expenses: Not hard at all  ?Food Insecurity: No Food Insecurity  ? Worried About Charity fundraiser in the Last Year: Never true  ? Ran Out of Food in the Last Year: Never true  ?Transportation Needs: No Transportation Needs  ? Lack of Transportation (Medical): No  ? Lack of Transportation (Non-Medical): No  ?Physical Activity: Inactive  ? Days of Exercise per Week: 0 days  ? Minutes of Exercise per Session: 0 min  ?Stress: No Stress Concern Present  ?  Feeling of Stress : Not at all  ?Social Connections: Moderately Isolated  ? Frequency of Communication with Friends and Family: More than three times a week  ? Frequency of Social Gatherings with Friends and Family: More than three times a week  ? Attends Religious Services: More than 4 times per year  ? Active Member of Clubs or Organizations: No  ? Attends Archivist Meetings: Never  ? Marital Status: Never married  ?Intimate Partner Violence: Not At Risk  ? Fear of Current or Ex-Partner: No  ? Emotionally Abused: No  ? Physically Abused: No  ? Sexually Abused: No  ? ? ?Current Outpatient Medications on File Prior to Visit  ?Medication Sig Dispense Refill  ? Alcohol Swabs (B-D SINGLE USE SWABS REGULAR) PADS USE AS DIRECTED OR NEEDED 300 each 12  ? amLODipine (NORVASC) 5 MG tablet Take 1 tablet (5 mg total) by mouth at bedtime. 90 tablet 3  ? Ascorbic Acid (VITAMIN C) 1000 MG tablet Take 1,000 mg by mouth  daily. Reported on 09/28/2015    ? aspirin 81 MG tablet Take 81 mg by mouth daily. Reported on 09/28/2015    ? atorvastatin (LIPITOR) 40 MG tablet TAKE 1 TABLET (40 MG TOTAL) BY MOUTH ONCE A WEEK. 13 tablet 3  ? Blood Glucose Calibration (TRUE METRIX LEVEL 1) Low SOLN Use as directed 1 each 1  ? blood glucose meter kit and supplies Dispense based on patient and insurance preference. Use up to four times daily as directed. (FOR ICD-9 250.00, 250.01). 1 each 0  ? Blood Glucose Monitoring Suppl (TRUE METRIX METER) w/Device KIT USE AS DIRECTED 1 kit 1  ? Calcium Carbonate-Vitamin D (CALCIUM-VITAMIN D3 PO) Take 1 tablet by mouth daily. Reported on 09/28/2015    ? Cyanocobalamin (B-12 PO) 5023m daily    ? glucose blood (TRUE METRIX BLOOD GLUCOSE TEST) test strip Use as instructed to check blood sugar before meals. 300 each 12  ? hydrochlorothiazide (HYDRODIURIL) 12.5 MG tablet Take 1 tablet (12.5 mg total) by mouth daily. 30 tablet 5  ? mesalamine (LIALDA) 1.2 g EC tablet Take 2 tablets (2.4 g total) by mouth 2 (two) times daily. 360 tablet 3  ? metFORMIN (GLUCOPHAGE) 1000 MG tablet TAKE 1 TABLET(1000 MG) BY MOUTH TWICE DAILY WITH A MEAL 180 tablet 3  ? Multiple Vitamins-Minerals (CENTRUM SILVER PO) Take 1 tablet by mouth daily. Reported on 09/28/2015    ? Omega-3 Fatty Acids (FISH OIL) 300 MG CAPS Take 300 mg by mouth daily.    ? TRUEplus Lancets 33G MISC USE TO CHECK SUGAR BEFORE MEALS 3 TIMES DAILY 300 each 0  ? ustekinumab (STELARA) 90 MG/ML SOSY injection Inject 1syringe under the skin every 8 weeks. 1 mL 6  ? ?No current facility-administered medications on file prior to visit.  ? ? ?Allergies  ?Allergen Reactions  ? Humira [Adalimumab] Rash  ?  psoriasis  ? Mercaptopurine Other (See Comments)  ?  Caused Pancreatitis  ? ? ?Family History  ?Problem Relation Age of Onset  ? Diabetes Mother   ? Breast cancer Sister   ? Diabetes Other   ?     cousin  ? Hypertension Other   ? Diabetes type I Son   ? Colon cancer Neg Hx   ?  Esophageal cancer Neg Hx   ? Rectal cancer Neg Hx   ? Stomach cancer Neg Hx   ? Colon polyps Neg Hx   ? ? ?BP (!) 138/96 (BP Location: Left  Arm, Patient Position: Sitting, Cuff Size: Normal)   Pulse 88   Wt 254 lb 9.6 oz (115.5 kg)   SpO2 95%   BMI 35.51 kg/m?  ? ? ?Review of Systems ? ?   ?Objective:  ? Physical Exam ?VITAL SIGNS:  See vs page ?GENERAL: no distress ? ?Lab Results  ?Component Value Date  ? CREATININE 0.96 08/10/2021  ? BUN 20 08/10/2021  ? NA 141 08/10/2021  ? K 3.9 08/10/2021  ? CL 104 08/10/2021  ? CO2 24 08/10/2021  ? ? ?Lab Results  ?Component Value Date  ? HGBA1C 6.2 (A) 08/21/2021  ? ?   ?Assessment & Plan:  ?DM: well-controlled ? ?Patient Instructions  ?Your blood pressure is high today.  Please see your primary care provider soon, to have it rechecked.   ?check your blood sugar once a day.  vary the time of day when you check, between before the 3 meals, and at bedtime.  also check if you have symptoms of your blood sugar being too high or too low.  please keep a record of the readings and bring it to your next appointment here (or you can bring the meter itself).  You can write it on any piece of paper.  please call us sooner if your blood sugar goes below 70, or if you have a lot of readings over 200.   ?Please continue the same metformin.   ?Please come back for a follow-up appointment in 6 months.   ? ? ?

## 2021-08-29 ENCOUNTER — Telehealth: Payer: Self-pay | Admitting: Family Medicine

## 2021-09-01 MED ORDER — AMLODIPINE BESYLATE 5 MG PO TABS: 5.0000 mg | ORAL_TABLET | Freq: Every day | ORAL | 0 refills | Status: DC

## 2021-09-01 NOTE — Telephone Encounter (Signed)
Already ran out of Rx. ? ?Can a 15 to 20 day Rx be put for amLODipine (NORVASC) 5 MG tablet? ? ?[facility] on [address]. ?[phone], call back  ?

## 2021-09-01 NOTE — Telephone Encounter (Signed)
10 day supply sent to local pharmacy as 90 supply with 3 refills was sent to [facility] on 03/29. ?

## 2021-09-06 NOTE — Progress Notes (Deleted)
? ?Chronic Care Management ?Pharmacy Note ? ?09/06/2021 ?Name:  SHAHZAIB AZEVEDO MRN:  [id number] DOB:  07/21/1955 ? ?Summary: ?PharmD follow up.  No hypoglycemia.  FBG between 104-130 lately.  Prefers to remain on same therapy. ? ?Recommendations/Changes made from today's visit: ?No changes at this time, continue to monitor.  Watch for hypoglycemia ? ?Plan: ?FU 4 months ? ? ?Subjective: ?BURNS TIMSON is an 66 y.o. year old male who is a primary patient of Hunter, Brayton Mars, MD.  The CCM team was consulted for assistance with disease management and care coordination needs.   ? ?Engaged with patient by telephone for follow up visit in response to provider referral for pharmacy case management and/or care coordination services.  ? ?Consent to Services:  ?The patient was given the following information about Chronic Care Management services today, agreed to services, and gave verbal consent: 1. CCM service includes personalized support from designated clinical staff supervised by the primary care provider, including individualized plan of care and coordination with other care providers 2. 24/7 contact phone numbers for assistance for urgent and routine care needs. 3. Service will only be billed when office clinical staff spend 20 minutes or more in a month to coordinate care. 4. Only one practitioner may furnish and bill the service in a calendar month. 5.The patient may stop CCM services at any time (effective at the end of the month) by phone call to the office staff. 6. The patient will be responsible for cost sharing (co-pay) of up to 20% of the service fee (after annual deductible is met). Patient agreed to services and consent obtained. ? ?Patient Care Team: ?[name], MD as PCP - General (Family Medicine) ?Renato Shin, MD as Consulting Physician (Endocrinology) ?Tanda Rockers, MD as Consulting Physician (Pulmonary Disease) ?Gatha Mayer, MD as Consulting Physician (Gastroenterology) ?Edythe Clarity,  [facility] (Pharmacist) ? ?  ?Recent office visits:  ?None ?  ?Recent consult visits:  ?08/31/21 Loanne Drilling) - Continue current medications.  BP was elevated.  Past visit he was able to d/c repaglinide and is now on metformin only. ?  ?Hospital visits:  ?None in previous 6 months ? ?Objective: ? ?Lab Results  ?Component Value Date  ? CREATININE 0.96 08/10/2021  ? BUN 20 08/10/2021  ? GFR 81.87 08/10/2021  ? GFRNONAA 58 (L) 12/26/2018  ? GFRAA >60 12/26/2018  ? NA 141 08/10/2021  ? K 3.9 08/10/2021  ? CALCIUM 9.3 08/10/2021  ? CO2 24 08/10/2021  ? GLUCOSE 109 (H) 08/10/2021  ? ? ?Lab Results  ?Component Value Date/Time  ? HGBA1C 6.2 (A) 08/21/2021 09:00 AM  ? HGBA1C 5.6 02/20/2021 09:07 AM  ? HGBA1C 7.1 (H) 08/09/2020 08:39 AM  ? HGBA1C 6.7 (H) 12/04/2018 10:09 AM  ? GFR 81.87 08/10/2021 09:00 AM  ? GFR 91.63 08/09/2020 08:39 AM  ? MICROALBUR 6.5 (H) 12/16/2020 10:01 AM  ? MICROALBUR 4.9 (H) 12/08/2019 01:24 PM  ? MICROALBUR 30-300 08/30/2016 09:58 AM  ?  ?Last diabetic Eye exam:  ?Lab Results  ?Component Value Date/Time  ? HMDIABEYEEXA No Retinopathy 06/22/2020 12:00 AM  ?  ?Last diabetic Foot exam: No results found for: HMDIABFOOTEX  ? ?Lab Results  ?Component Value Date  ? CHOL 115 08/10/2021  ? HDL 41.20 08/10/2021  ? [place] 64 08/10/2021  ? LDLDIRECT 64.0 12/08/2019  ? TRIG 53.0 08/10/2021  ? CHOLHDL 3 08/10/2021  ? ? ? ?  Latest Ref Rng & Units 08/10/2021  ?  9:00 AM 08/09/2020  ?  8:39 AM 12/08/2019  ?  1:24 PM  ?Hepatic Function  ?Total Protein 6.0 - 8.3 g/dL 7.0   7.0   7.1    ?Albumin 3.5 - 5.2 g/dL 4.3   4.1   4.1    ?AST 0 - 37 U/L 62   36   17    ?ALT 0 - 53 U/L 42   26   21    ?Alk Phosphatase 39 - 117 U/L 33   39   38    ?Total Bilirubin 0.2 - 1.2 mg/dL 0.9   0.7   0.6    ? ? ?Lab Results  ?Component Value Date/Time  ? TSH 1.18 07/20/2014 09:18 AM  ? TSH 1.18 05/20/2013 09:19 AM  ? ? ? ?  Latest Ref Rng & Units 08/10/2021  ?  9:00 AM 08/09/2020  ?  8:39 AM 12/08/2019  ?  1:24 PM  ?CBC  ?WBC 4.0 - 10.5 K/uL 4.9   4.4   8.1     ?Hemoglobin 13.0 - 17.0 g/dL 13.5   13.4   13.3    ?Hematocrit 39.0 - 52.0 % 39.7   39.6   39.9    ?Platelets 150.0 - 400.0 K/uL 194.0   219.0   250.0    ? ? ?Lab Results  ?Component Value Date/Time  ? VD25OH 56.22 08/10/2021 09:00 AM  ? VD25OH 60.19 08/09/2020 08:39 AM  ? ? ?Clinical ASCVD: No  ?The ASCVD Risk score (Arnett DK, et al., 2019) failed to calculate for the following reasons: ?  The valid total cholesterol range is 130 to 320 mg/dL   ? ? ?  06/12/2021  ?  9:36 AM 12/16/2020  ?  9:21 AM 08/09/2020  ?  8:01 AM  ?Depression screen PHQ 2/9  ?Decreased Interest 0 0 0  ?Down, Depressed, Hopeless 0 0 0  ?PHQ - 2 Score 0 0 0  ?  ? ? ?Social History  ? ?Tobacco Use  ?Smoking Status Former  ? Packs/day: 1.00  ? Years: 6.00  ? Pack years: 6.00  ? Types: Cigarettes  ? Quit date: 06/04/2000  ? Years since quitting: 21.2  ?Smokeless Tobacco Never  ? ?BP Readings from Last 3 Encounters:  ?08/21/21 (!) 138/96  ?08/10/21 110/80  ?07/04/21 140/82  ? ?Pulse Readings from Last 3 Encounters:  ?08/21/21 88  ?08/10/21 76  ?07/04/21 92  ? ?Wt Readings from Last 3 Encounters:  ?08/21/21 254 lb 9.6 oz (115.5 kg)  ?08/10/21 249 lb 9.6 oz (113.2 kg)  ?07/04/21 257 lb (116.6 kg)  ? ?BMI Readings from Last 3 Encounters:  ?08/21/21 35.51 kg/m?  ?08/10/21 34.81 kg/m?  ?07/04/21 35.84 kg/m?  ? ? ?Assessment/Interventions: Review of patient past medical history, allergies, medications, health status, including review of consultants reports, laboratory and other test data, was performed as part of comprehensive evaluation and provision of chronic care management services.  ? ?SDOH:  (Social Determinants of Health) assessments and interventions performed: Yes ? ?Financial Resource Strain: Low Risk   ? Difficulty of Paying Living Expenses: Not hard at all  ? ? ?SDOH Screenings  ? ?Alcohol Screen: Not on file  ?Depression (PHQ2-9): Low Risk   ? PHQ-2 Score: 0  ?Financial Resource Strain: Low Risk   ? Difficulty of Paying Living Expenses: Not  hard at all  ?Food Insecurity: No Food Insecurity  ? Worried About Charity fundraiser in the Last Year: Never true  ? Ran Out of Food in the Last Year:  Never true  ?Housing: Low Risk   ? Last Housing Risk Score: 0  ?Physical Activity: Inactive  ? Days of Exercise per Week: 0 days  ? Minutes of Exercise per Session: 0 min  ?Social Connections: Moderately Isolated  ? Frequency of Communication with Friends and Family: More than three times a week  ? Frequency of Social Gatherings with Friends and Family: More than three times a week  ? Attends Religious Services: More than 4 times per year  ? Active Member of Clubs or Organizations: No  ? Attends Archivist Meetings: Never  ? Marital Status: Never married  ?Stress: No Stress Concern Present  ? Feeling of Stress : Not at all  ?Tobacco Use: Medium Risk  ? Smoking Tobacco Use: Former  ? Smokeless Tobacco Use: Never  ? Passive Exposure: Not on file  ?Transportation Needs: No Transportation Needs  ? Lack of Transportation (Medical): No  ? Lack of Transportation (Non-Medical): No  ? ? ?CCM Care Plan ? ?Allergies  ?Allergen Reactions  ? Humira [Adalimumab] Rash  ?  psoriasis  ? Mercaptopurine Other (See Comments)  ?  Caused Pancreatitis  ? ? ?Medications Reviewed Today   ? ? Reviewed by Sarina Ill, CMA (Certified Medical Assistant) on 08/21/21 at [phone]  Med List Status: <None>  ? ?Medication Order Taking? Sig Documenting Provider Last Dose Status Informant  ?Alcohol Swabs (B-D SINGLE USE SWABS REGULAR) PADS [id number]  USE AS DIRECTED OR NEEDED Renato Shin, MD  Active   ?amLODipine (NORVASC) 5 MG tablet [id number]  Take 1 tablet (5 mg total) by mouth at bedtime. [name], MD  Active   ?Ascorbic Acid (VITAMIN C) 1000 MG tablet [id number]  Take 1,000 mg by mouth daily. Reported on 09/28/2015 [provider]  Active Self  ?aspirin 81 MG tablet [id number]  Take 81 mg by mouth daily. Reported on 09/28/2015 [provider]  Active Self   ?atorvastatin (LIPITOR) 40 MG tablet [id number]  TAKE 1 TABLET (40 MG TOTAL) BY MOUTH ONCE A WEEK. [name], MD  Active   ?Blood Glucose Calibration (TRUE METRIX LEVEL 1) Low SOLN [id number]  Use as d

## 2021-09-12 ENCOUNTER — Telehealth: Payer: Medicare HMO

## 2021-09-18 ENCOUNTER — Ambulatory Visit (INDEPENDENT_AMBULATORY_CARE_PROVIDER_SITE_OTHER): Payer: Medicare HMO | Admitting: Pharmacist

## 2021-09-18 DIAGNOSIS — E118 Type 2 diabetes mellitus with unspecified complications: Secondary | ICD-10-CM

## 2021-09-18 DIAGNOSIS — I10 Essential (primary) hypertension: Secondary | ICD-10-CM

## 2021-09-18 NOTE — Progress Notes (Signed)
? ?Chronic Care Management ?Pharmacy Note ? ?09/18/2021 ?Name:  BASEM YANNUZZI MRN:  [id number] DOB:  May 08, 1956 ? ?Summary: ?PharmD follow up.  115, 97, 104, 111, 101, 72 - recent fasting glucose readings.  Continues on just metformin.  Last office BP slightly elevated, has been normal at home all at goal < 130/80.  Continues to monitor every other day. ? ?Recommendations/Changes made from today's visit: ?No changes at this time, continue to monitor.   ?CMA to assess BP in 30 days. ? ?Plan: ?FU 6 months ? ? ?Subjective: ?JIYAN WALKOWSKI is an 66 y.o. year old male who is a primary patient of Hunter, Brayton Mars, MD.  The CCM team was consulted for assistance with disease management and care coordination needs.   ? ?Engaged with patient by telephone for follow up visit in response to provider referral for pharmacy case management and/or care coordination services.  ? ?Consent to Services:  ?The patient was given the following information about Chronic Care Management services today, agreed to services, and gave verbal consent: 1. CCM service includes personalized support from designated clinical staff supervised by the primary care provider, including individualized plan of care and coordination with other care providers 2. 24/7 contact phone numbers for assistance for urgent and routine care needs. 3. Service will only be billed when office clinical staff spend 20 minutes or more in a month to coordinate care. 4. Only one practitioner may furnish and bill the service in a calendar month. 5.The patient may stop CCM services at any time (effective at the end of the month) by phone call to the office staff. 6. The patient will be responsible for cost sharing (co-pay) of up to 20% of the service fee (after annual deductible is met). Patient agreed to services and consent obtained. ? ?Patient Care Team: ?[name], MD as PCP - General (Family Medicine) ?Renato Shin, MD as Consulting Physician (Endocrinology) ?Tanda Rockers, MD as Consulting Physician (Pulmonary Disease) ?Gatha Mayer, MD as Consulting Physician (Gastroenterology) ?Edythe Clarity, [facility] (Pharmacist) ? ?  ?Recent office visits:  ?None ?  ?Recent consult visits:  ?None ?  ?Hospital visits:  ?None in previous 6 months ? ?Objective: ? ?Lab Results  ?Component Value Date  ? CREATININE 0.96 08/10/2021  ? BUN 20 08/10/2021  ? GFR 81.87 08/10/2021  ? GFRNONAA 58 (L) 12/26/2018  ? GFRAA >60 12/26/2018  ? NA 141 08/10/2021  ? K 3.9 08/10/2021  ? CALCIUM 9.3 08/10/2021  ? CO2 24 08/10/2021  ? GLUCOSE 109 (H) 08/10/2021  ? ? ?Lab Results  ?Component Value Date/Time  ? HGBA1C 6.2 (A) 08/21/2021 09:00 AM  ? HGBA1C 5.6 02/20/2021 09:07 AM  ? HGBA1C 7.1 (H) 08/09/2020 08:39 AM  ? HGBA1C 6.7 (H) 12/04/2018 10:09 AM  ? GFR 81.87 08/10/2021 09:00 AM  ? GFR 91.63 08/09/2020 08:39 AM  ? MICROALBUR 6.5 (H) 12/16/2020 10:01 AM  ? MICROALBUR 4.9 (H) 12/08/2019 01:24 PM  ? MICROALBUR 30-300 08/30/2016 09:58 AM  ?  ?Last diabetic Eye exam:  ?Lab Results  ?Component Value Date/Time  ? HMDIABEYEEXA No Retinopathy 06/22/2020 12:00 AM  ?  ?Last diabetic Foot exam: No results found for: HMDIABFOOTEX  ? ?Lab Results  ?Component Value Date  ? CHOL 115 08/10/2021  ? HDL 41.20 08/10/2021  ? [place] 64 08/10/2021  ? LDLDIRECT 64.0 12/08/2019  ? TRIG 53.0 08/10/2021  ? CHOLHDL 3 08/10/2021  ? ? ? ?  Latest Ref Rng & Units 08/10/2021  ?  9:00 AM 08/09/2020  ?  8:39 AM 12/08/2019  ?  1:24 PM  ?Hepatic Function  ?Total Protein 6.0 - 8.3 g/dL 7.0   7.0   7.1    ?Albumin 3.5 - 5.2 g/dL 4.3   4.1   4.1    ?AST 0 - 37 U/L 62   36   17    ?ALT 0 - 53 U/L 42   26   21    ?Alk Phosphatase 39 - 117 U/L 33   39   38    ?Total Bilirubin 0.2 - 1.2 mg/dL 0.9   0.7   0.6    ? ? ?Lab Results  ?Component Value Date/Time  ? TSH 1.18 07/20/2014 09:18 AM  ? TSH 1.18 05/20/2013 09:19 AM  ? ? ? ?  Latest Ref Rng & Units 08/10/2021  ?  9:00 AM 08/09/2020  ?  8:39 AM 12/08/2019  ?  1:24 PM  ?CBC  ?WBC 4.0 - 10.5 K/uL 4.9   4.4    8.1    ?Hemoglobin 13.0 - 17.0 g/dL 13.5   13.4   13.3    ?Hematocrit 39.0 - 52.0 % 39.7   39.6   39.9    ?Platelets 150.0 - 400.0 K/uL 194.0   219.0   250.0    ? ? ?Lab Results  ?Component Value Date/Time  ? VD25OH 56.22 08/10/2021 09:00 AM  ? VD25OH 60.19 08/09/2020 08:39 AM  ? ? ?Clinical ASCVD: No  ?The ASCVD Risk score (Arnett DK, et al., 2019) failed to calculate for the following reasons: ?  The valid total cholesterol range is 130 to 320 mg/dL   ? ? ?  06/12/2021  ?  9:36 AM 12/16/2020  ?  9:21 AM 08/09/2020  ?  8:01 AM  ?Depression screen PHQ 2/9  ?Decreased Interest 0 0 0  ?Down, Depressed, Hopeless 0 0 0  ?PHQ - 2 Score 0 0 0  ?  ? ? ?Social History  ? ?Tobacco Use  ?Smoking Status Former  ? Packs/day: 1.00  ? Years: 6.00  ? Pack years: 6.00  ? Types: Cigarettes  ? Quit date: 06/04/2000  ? Years since quitting: 21.3  ?Smokeless Tobacco Never  ? ?BP Readings from Last 3 Encounters:  ?08/21/21 (!) 138/96  ?08/10/21 110/80  ?07/04/21 140/82  ? ?Pulse Readings from Last 3 Encounters:  ?08/21/21 88  ?08/10/21 76  ?07/04/21 92  ? ?Wt Readings from Last 3 Encounters:  ?08/21/21 254 lb 9.6 oz (115.5 kg)  ?08/10/21 249 lb 9.6 oz (113.2 kg)  ?07/04/21 257 lb (116.6 kg)  ? ?BMI Readings from Last 3 Encounters:  ?08/21/21 35.51 kg/m?  ?08/10/21 34.81 kg/m?  ?07/04/21 35.84 kg/m?  ? ? ?Assessment/Interventions: Review of patient past medical history, allergies, medications, health status, including review of consultants reports, laboratory and other test data, was performed as part of comprehensive evaluation and provision of chronic care management services.  ? ?SDOH:  (Social Determinants of Health) assessments and interventions performed: Yes ? ?Financial Resource Strain: Low Risk   ? Difficulty of Paying Living Expenses: Not hard at all  ? ? ?SDOH Screenings  ? ?Alcohol Screen: Not on file  ?Depression (PHQ2-9): Low Risk   ? PHQ-2 Score: 0  ?Financial Resource Strain: Low Risk   ? Difficulty of Paying Living Expenses:  Not hard at all  ?Food Insecurity: No Food Insecurity  ? Worried About Charity fundraiser in the Last Year: Never true  ? Ran Out  of Food in the Last Year: Never true  ?Housing: Low Risk   ? Last Housing Risk Score: 0  ?Physical Activity: Inactive  ? Days of Exercise per Week: 0 days  ? Minutes of Exercise per Session: 0 min  ?Social Connections: Moderately Isolated  ? Frequency of Communication with Friends and Family: More than three times a week  ? Frequency of Social Gatherings with Friends and Family: More than three times a week  ? Attends Religious Services: More than 4 times per year  ? Active Member of Clubs or Organizations: No  ? Attends Archivist Meetings: Never  ? Marital Status: Never married  ?Stress: No Stress Concern Present  ? Feeling of Stress : Not at all  ?Tobacco Use: Medium Risk  ? Smoking Tobacco Use: Former  ? Smokeless Tobacco Use: Never  ? Passive Exposure: Not on file  ?Transportation Needs: No Transportation Needs  ? Lack of Transportation (Medical): No  ? Lack of Transportation (Non-Medical): No  ? ? ?CCM Care Plan ? ?Allergies  ?Allergen Reactions  ? Humira [Adalimumab] Rash  ?  psoriasis  ? Mercaptopurine Other (See Comments)  ?  Caused Pancreatitis  ? ? ?Medications Reviewed Today   ? ? Reviewed by Edythe Clarity, RPH (Pharmacist) on 09/18/21 at 1326  Med List Status: <None>  ? ?Medication Order Taking? Sig Documenting Provider Last Dose Status Informant  ?Alcohol Swabs (B-D SINGLE USE SWABS REGULAR) PADS [id number] Yes USE AS DIRECTED OR NEEDED Renato Shin, MD Taking Active   ?amLODipine (NORVASC) 5 MG tablet [id number] Yes Take 1 tablet (5 mg total) by mouth at bedtime. [name], MD Taking Active   ?Ascorbic Acid (VITAMIN C) 1000 MG tablet [id number] Yes Take 1,000 mg by mouth daily. Reported on 09/28/2015 [provider] Taking Active Self  ?aspirin 81 MG tablet [id number] Yes Take 81 mg by mouth daily. Reported on 09/28/2015 [provider]  Taking Active Self  ?atorvastatin (LIPITOR) 40 MG tablet [id number] Yes TAKE 1 TABLET (40 MG TOTAL) BY MOUTH ONCE A WEEK. [name], MD Taking Active   ?Blood Glucose Calibration (TRUE METRIX LEVEL

## 2021-09-18 NOTE — Patient Instructions (Addendum)
Visit Information ? ? Goals Addressed   ? ?  ?  ?  ?  ? This Visit's Progress  ?  Monitor and Manage My Blood Sugar-Diabetes Type 2   On track  ?  Timeframe:  Long-Range Goal ?Priority:  High ?Start Date:   05/08/21                          ?Expected End Date:  11/06/21                    ? ?Follow Up Date 08/06/21  ?  ?- check blood sugar at prescribed times ?- check blood sugar before and after exercise ?- check blood sugar if I feel it is too high or too low ?- enter blood sugar readings and medication or insulin into daily log  ?  ?Why is this important?   ?Checking your blood sugar at home helps to keep it from getting very high or very low.  ?Writing the results in a diary or log helps the doctor know how to care for you.  ?Your blood sugar log should have the time, date and the results.  ?Also, write down the amount of insulin or other medicine that you take.  ?Other information, like what you ate, exercise done and how you were feeling, will also be helpful.   ?  ?Notes:  ?  ? ?  ? ?Patient Care Plan: General Pharmacy (Adult)  ?  ? ?Problem Identified: HTN, HLD, DM   ?Priority: High  ?Onset Date: 05/08/2021  ?  ? ?Long-Range Goal: Patient-Specific Goal   ?Start Date: 09/18/2021  ?Expected End Date: 03/20/2022  ?This Visit's Progress: On track  ?Priority: High  ?Note:   ?Current Barriers:  ?Lack of routine physical activity. ? ?Pharmacist Clinical Goal(s):  ?Patient will maintain control of glucose and lipids as evidenced by labs  through collaboration with PharmD and provider.  ? ?Interventions: ?1:1 collaboration with [name], MD regarding development and update of comprehensive plan of care as evidenced by provider attestation and co-signature ?Inter-disciplinary care team collaboration (see longitudinal plan of care) ?Comprehensive medication review performed; medication list updated in electronic medical record ? 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 ? ?Update 09/18/21 ?128/90, 127/80 ?BP was slightly elevated last office recorded BP. ?Recommended he continue to monitor at home. ?Denies any dizziness or HA. ?CMA to call for BP check in 30 days, will follow closely. ? ?Hyperlipidemia: (LDL goal < 70) ?-Controlled ?-Current treatment: ?Atorvastatin 424mdaily ?-Medications previously tried: none noted  ?-Current dietary patterns: see above ?-Current exercise habits: see above ?-Educated on Cholesterol goals;  ?Benefits of statin for ASCVD  risk reduction; ?Importance of limiting foods high in cholesterol; ?Exercise goal of 150 minutes per week; ?-Recommended to continue current medication ?LDL well controlled, patient is adherent, no adverse effects with medication ? 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  Prefers to remain on current therapy.  He is not having any episodes of hypoglycemia per his reports which is my main concern.  This is encouraging news!  Continue for now, incorporate physical activity in exercise routine.  Continue routine A1c screenings and monitoring as current. ? ?Update 09/18/21 ?FBG - 115, 97, 104, 111, 101, 72 - recent fasting glucose readings ?Denies any episodes of hypoglycemia. ?He continues to do well on just metformin. ?Most recent A1c controlled at 6.2. ?Continue routine screenings, current meds. ?Exercise as the weather gets better! ? ?Patient Goals/Self-Care Activities ?Patient will:  ?- take medications as prescribed as evidenced by patient report and record review ?check glucose daily, document, and provide at future appointments ? ?Follow Up Plan: The care management team will reach out to the patient again over the next 120 days.  ? ? ? ?  ? ?  ?  ? ?The patient verbalized understanding of instructions, educational materials, and care plan provided today and declined offer to receive copy of patient instructions, educational materials, and care plan.  ?Telephone follow up appointment with pharmacy team member scheduled for: 6 months ? ?CEdythe Clarity [facility] ?CBeverly Milch PharmD ?Clinical Pharmacist  ?LOrvan July?[phone]? ?

## 2021-10-01 DIAGNOSIS — Z7984 Long term (current) use of oral hypoglycemic drugs: Secondary | ICD-10-CM | POA: Diagnosis not present

## 2021-10-01 DIAGNOSIS — E1159 Type 2 diabetes mellitus with other circulatory complications: Secondary | ICD-10-CM | POA: Diagnosis not present

## 2021-10-01 DIAGNOSIS — E785 Hyperlipidemia, unspecified: Secondary | ICD-10-CM | POA: Diagnosis not present

## 2021-10-01 DIAGNOSIS — I10 Essential (primary) hypertension: Secondary | ICD-10-CM | POA: Diagnosis not present

## 2021-11-09 ENCOUNTER — Telehealth: Payer: Self-pay | Admitting: Pharmacist

## 2021-11-09 NOTE — Progress Notes (Signed)
Chronic Care Management Pharmacy Assistant   Name: Angel Frazier  MRN: [id number] DOB: 04-19-1956   Reason for Encounter: Hypertension Adherence Call    Recent office visits:  None since last CPP visit  Recent consult visits:  None since last CPP visit  Hospital visits:  None in previous 6 months  Medications: Outpatient Encounter Medications as of 11/09/2021  Medication Sig   Alcohol Swabs (B-D SINGLE USE SWABS REGULAR) PADS USE AS DIRECTED OR NEEDED   amLODipine (NORVASC) 5 MG tablet Take 1 tablet (5 mg total) by mouth at bedtime.   Ascorbic Acid (VITAMIN C) 1000 MG tablet Take 1,000 mg by mouth daily. Reported on 09/28/2015   aspirin 81 MG tablet Take 81 mg by mouth daily. Reported on 09/28/2015   atorvastatin (LIPITOR) 40 MG tablet TAKE 1 TABLET (40 MG TOTAL) BY MOUTH ONCE A WEEK.   Blood Glucose Calibration (TRUE METRIX LEVEL 1) Low SOLN Use as directed   blood glucose meter kit and supplies Dispense based on patient and insurance preference. Use up to four times daily as directed. (FOR ICD-9 250.00, 250.01).   Blood Glucose Monitoring Suppl (TRUE METRIX METER) w/Device KIT USE AS DIRECTED   Calcium Carbonate-Vitamin D (CALCIUM-VITAMIN D3 PO) Take 1 tablet by mouth daily. Reported on 09/28/2015   Cyanocobalamin (B-12 PO) 5034m daily   glucose blood (TRUE METRIX BLOOD GLUCOSE TEST) test strip Use as instructed to check blood sugar before meals.   hydrochlorothiazide (HYDRODIURIL) 12.5 MG tablet Take 1 tablet (12.5 mg total) by mouth daily.   mesalamine (LIALDA) 1.2 g EC tablet Take 2 tablets (2.4 g total) by mouth 2 (two) times daily.   metFORMIN (GLUCOPHAGE) 1000 MG tablet TAKE 1 TABLET(1000 MG) BY MOUTH TWICE DAILY WITH A MEAL   Multiple Vitamins-Minerals (CENTRUM SILVER PO) Take 1 tablet by mouth daily. Reported on 09/28/2015   Omega-3 Fatty Acids (FISH OIL) 300 MG CAPS Take 300 mg by mouth daily.   TRUEplus Lancets 33G MISC USE TO CHECK SUGAR BEFORE MEALS 3 TIMES DAILY    ustekinumab (STELARA) 90 MG/ML SOSY injection Inject 1syringe under the skin every 8 weeks.   No facility-administered encounter medications on file as of 11/09/2021.   Reviewed chart prior to disease state call. Spoke with patient regarding BP  Recent Office Vitals: BP Readings from Last 3 Encounters:  08/21/21 (!) 138/96  08/10/21 110/80  07/04/21 140/82   Pulse Readings from Last 3 Encounters:  08/21/21 88  08/10/21 76  07/04/21 92    Wt Readings from Last 3 Encounters:  08/21/21 254 lb 9.6 oz (115.5 kg)  08/10/21 249 lb 9.6 oz (113.2 kg)  07/04/21 257 lb (116.6 kg)     Kidney Function Lab Results  Component Value Date/Time   CREATININE 0.96 08/10/2021 09:00 AM   CREATININE 0.82 08/09/2020 08:39 AM   GFR 81.87 08/10/2021 09:00 AM   GFRNONAA 58 (L) 12/26/2018 03:36 AM   GFRAA >60 12/26/2018 03:36 AM       Latest Ref Rng & Units 08/10/2021    9:00 AM 08/09/2020    8:39 AM 12/08/2019    1:24 PM  BMP  Glucose 70 - 99 mg/dL 109  117  129   BUN 6 - 23 mg/dL 20  14  14    Creatinine 0.40 - 1.50 mg/dL 0.96  0.82  0.76   Sodium 135 - 145 mEq/L 141  140  138   Potassium 3.5 - 5.1 mEq/L 3.9  3.6  3.8  Chloride 96 - 112 mEq/L 104  104  98   CO2 19 - 32 mEq/L 24  25  29    Calcium 8.4 - 10.5 mg/dL 9.3  9.2  9.5     Current antihypertensive regimen:  Amlodipine 5 mg at bedtime HCTZ 12.5 mg daily  How often are you checking your Blood Pressure? 3-5x per week  Current home BP readings: 130/89  What recent interventions/DTPs have been made by any provider to improve Blood Pressure control since last CPP Visit: No recent interventions or DTPs.  Any recent hospitalizations or ED visits since last visit with CPP? No  What diet changes have been made to improve Blood Pressure Control?  No recent diet changes.  What exercise is being done to improve your Blood Pressure Control?  Patient states he tries to be active as much as he can.  Adherence Review: Is the patient currently  on ACE/ARB medication? No Does the patient have >5 day gap between last estimated fill dates? No   Care Gaps: Medicare Annual Wellness: Completed 06/12/2021 Ophthalmology Exam: Overdue since 06/22/2021 Foot Exam: Next due on 02/20/2022 Hemoglobin A1C: 6.2% on 08/21/2021 Colonoscopy: Completed on 03/02/2021  Future Appointments  Date Time Provider [place]  03/20/2022  3:00 PM [facility] CCM PHARMACIST [facility]  06/25/2022  9:30 AM [facility] HEALTH COACH [facility]   Star Rating Drugs: Atorvastatin 40 mg last filled 08/30/2021 90 DS Metformin 1000 mg last filled 08/30/2021 90 DS  April D Calhoun, [place] Pharmacist Assistant [phone]

## 2021-11-13 ENCOUNTER — Ambulatory Visit: Payer: Self-pay | Admitting: Pharmacist

## 2021-11-13 DIAGNOSIS — I152 Hypertension secondary to endocrine disorders: Secondary | ICD-10-CM

## 2021-11-13 DIAGNOSIS — E119 Type 2 diabetes mellitus without complications: Secondary | ICD-10-CM

## 2021-11-13 NOTE — Progress Notes (Signed)
Chronic Care Management   Outreach Note  11/13/2021 Name: Angel Frazier MRN: [id number] DOB: 1955-10-20  Referred by: [name], MD Reason for referral : No chief complaint on file.   Updated blood pressure in chart with patient-reported home BP:  BP Readings from Last 3 Encounters:  08/21/21 (!) 138/96  08/10/21 110/80  07/04/21 140/82    There are no care plans that you recently modified to display for this patient.   Edythe Clarity, [place], PharmD Clinical Pharmacist  [place] [phone]   Patient Care Plan: General Pharmacy (Adult)     Problem Identified: HTN, HLD, DM   Priority: High  Onset Date: 05/08/2021     Long-Range Goal: Patient-Specific Goal   Start Date: 09/18/2021  Expected End Date: 03/20/2022  This Visit's Progress: On track  Priority: High  Note:   Current Barriers:  Lack of routine physical activity.  Pharmacist Clinical Goal(s):  Patient will maintain control of glucose and lipids as evidenced by labs  through collaboration with PharmD and provider.   Interventions: 1:1 collaboration with [name], MD regarding development and update of comprehensive plan of care as evidenced by provider attestation and co-signature Inter-disciplinary care team collaboration (see longitudinal plan of care) Comprehensive medication review performed; medication list updated in electronic medical record  Hypertension (BP goal <130/80) -Controlled -Current treatment: Amlodipine 34m daily Appropriate, Effective, Safe, Accessible HCTZ 12.5 mg Appropriate, Effective, Safe, Accessible -Medications previously tried:  none noted -Current home readings: not checking currently at home -Current dietary habits: balanced diet, limits salts and fast foods -Current exercise habits: 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  Update 09/18/21 128/90, 127/80 BP was slightly elevated last office recorded BP. Recommended he continue to monitor at home. Denies any dizziness or HA. CMA to call for BP check in 30 days, will follow closely.  Hyperlipidemia: (LDL goal < 70) -Controlled -Current treatment: Atorvastatin 479mdaily -Medications previously tried: none noted  -Current dietary patterns: see above -Current exercise habits: see above -Educated on Cholesterol goals;  Benefits of statin for ASCVD risk reduction; Importance of limiting foods high in cholesterol; Exercise goal of 150 minutes per week; -Recommended to continue current medication LDL well controlled, patient is adherent, no adverse effects with medication  Diabetes (A1c goal <7%) -Controlled -Current medications: Metformin 100013mwice daily per meal Appropriate, Effective, Safe, Accessible -Medications previously tried: Lantus, Novolog, Rybelsus, pioglitazone  -Current home glucose readings fasting glucose: 125 this morning, 126, 104, 115 post prandial glucose: not checking -Denies hypoglycemic/hyperglycemic symptoms -Current exercise: 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  Prefers to remain on current therapy.  He is not having any episodes of hypoglycemia per his reports  which is my main concern.  This is encouraging news!  Continue for now, incorporate physical activity in exercise routine.  Continue routine A1c screenings and monitoring as current.  Update 09/18/21 [facility] - 115, 97, 104, 111, 101, 72 - recent fasting glucose readings Denies any episodes of hypoglycemia. He continues to do well on just metformin. Most recent A1c controlled at 6.2. Continue routine screenings, current meds. Exercise as the weather gets better!  Patient Goals/Self-Care Activities Patient will:  - take medications as prescribed as evidenced by patient report and record review check glucose daily, document, and provide at future appointments  Follow Up Plan: The care management team will reach out to the patient again over the next 120 days.

## 2021-11-13 NOTE — Chronic Care Management (AMB) (Signed)
Updated blood pressure in chart with patient-reported home BP:  BP Readings from Last 3 Encounters:  11/09/21 130/89  08/21/21 (!) 138/96  08/10/21 110/80    There are no care plans that you recently modified to display for this patient.   Edythe Clarity, [facility]

## 2022-01-08 ENCOUNTER — Other Ambulatory Visit: Payer: Self-pay | Admitting: Family Medicine

## 2022-01-31 ENCOUNTER — Other Ambulatory Visit: Payer: Self-pay | Admitting: Internal Medicine

## 2022-01-31 DIAGNOSIS — R918 Other nonspecific abnormal finding of lung field: Secondary | ICD-10-CM

## 2022-02-07 ENCOUNTER — Ambulatory Visit (HOSPITAL_COMMUNITY)
Admission: RE | Admit: 2022-02-07 | Discharge: 2022-02-07 | Disposition: A | Discharge status: Home / Self Care | Payer: Medicare HMO | Source: Ambulatory Visit | Attending: Internal Medicine | Admitting: Internal Medicine

## 2022-02-07 DIAGNOSIS — I700 Atherosclerosis of aorta: Secondary | ICD-10-CM | POA: Diagnosis not present

## 2022-02-07 DIAGNOSIS — R918 Other nonspecific abnormal finding of lung field: Secondary | ICD-10-CM | POA: Diagnosis not present

## 2022-02-12 ENCOUNTER — Telehealth: Payer: Self-pay | Admitting: Internal Medicine

## 2022-02-12 ENCOUNTER — Ambulatory Visit (INDEPENDENT_AMBULATORY_CARE_PROVIDER_SITE_OTHER): Payer: Medicare HMO | Admitting: Family Medicine

## 2022-02-12 ENCOUNTER — Encounter: Payer: Self-pay | Admitting: Family Medicine

## 2022-02-12 ENCOUNTER — Telehealth: Payer: Self-pay | Admitting: Family Medicine

## 2022-02-12 VITALS — BP 130/78 | HR 73 | Temp 97.8°F | Ht 71.0 in | Wt 242.6 lb

## 2022-02-12 DIAGNOSIS — I10 Essential (primary) hypertension: Secondary | ICD-10-CM | POA: Diagnosis not present

## 2022-02-12 DIAGNOSIS — E785 Hyperlipidemia, unspecified: Secondary | ICD-10-CM

## 2022-02-12 DIAGNOSIS — E119 Type 2 diabetes mellitus without complications: Secondary | ICD-10-CM | POA: Diagnosis not present

## 2022-02-12 DIAGNOSIS — E1169 Type 2 diabetes mellitus with other specified complication: Secondary | ICD-10-CM

## 2022-02-12 LAB — COMPREHENSIVE METABOLIC PANEL
ALT: 31 U/L (ref 0–53)
AST: 31 U/L (ref 0–37)
Albumin: 4.1 g/dL (ref 3.5–5.2)
Alkaline Phosphatase: 33 U/L — ABNORMAL LOW (ref 39–117)
BUN: 17 mg/dL (ref 6–23)
CO2: 30 mEq/L (ref 19–32)
Calcium: 9.4 mg/dL (ref 8.4–10.5)
Chloride: 101 mEq/L (ref 96–112)
Creatinine, Ser: 0.99 mg/dL (ref 0.40–1.50)
GFR: 78.62 mL/min (ref >60.00)
Glucose, Bld: 113 mg/dL — ABNORMAL HIGH (ref 70–99)
Potassium: 4.4 mEq/L (ref 3.5–5.1)
Sodium: 139 mEq/L (ref 135–145)
Total Bilirubin: 0.7 mg/dL (ref 0.2–1.2)
Total Protein: 7.5 g/dL (ref 6.0–8.3)

## 2022-02-12 LAB — MICROALBUMIN / CREATININE URINE RATIO
Creatinine,U: 116.2 mg/dL
Microalb Creat Ratio: 3.6 mg/g (ref 0.0–30.0)
Microalb, Ur: 4.2 mg/dL — ABNORMAL HIGH (ref 0.0–1.9)

## 2022-02-12 LAB — HEMOGLOBIN A1C: Hgb A1c MFr Bld: 6.7 % — ABNORMAL HIGH (ref 4.6–6.5)

## 2022-02-12 NOTE — Progress Notes (Signed)
Called pt and there was no answer-LMTCB °

## 2022-02-12 NOTE — Progress Notes (Signed)
Phone [phone] In person visit   Subjective:   Angel Frazier is a 66 y.o. year old very pleasant male patient who presents for/with See problem oriented charting Chief Complaint  Patient presents with   Follow-up   Hypertension   Past Medical History-  Patient Active Problem List   Diagnosis Date Noted   Diabetes mellitus type II, controlled ([place]) 12/22/2018    Priority: High   Long-term use of immunosuppressant medication Stelara 07/12/2015    Priority: High   Universal ulcerative colitis ([place]) 08/11/2008    Priority: High   Hyperlipidemia associated with type 2 diabetes mellitus ([place]) 01/20/2019    Priority: Medium    Essential hypertension 01/20/2019    Priority: Medium    Pulmonary nodules 09/07/2015    Priority: Medium    Former smoker 09/17/2019    Priority: Low   Long term current use of systemic steroids 11/18/2014    Priority: Low   History of avascular necrosis  11/18/2014    Priority: Low   Vitamin D deficiency 01/24/2013    Priority: Low   ED (erectile dysfunction) 05/21/2012    Priority: Low   Psoriasis caused by Humira 04/13/2019    Medications- reviewed and updated Current Outpatient Medications  Medication Sig Dispense Refill   Alcohol Swabs (B-D SINGLE USE SWABS REGULAR) PADS USE AS DIRECTED OR NEEDED 300 each 12   amLODipine (NORVASC) 5 MG tablet Take 1 tablet (5 mg total) by mouth at bedtime. 10 tablet 0   Ascorbic Acid (VITAMIN C) 1000 MG tablet Take 1,000 mg by mouth daily. Reported on 09/28/2015     aspirin 81 MG tablet Take 81 mg by mouth daily. Reported on 09/28/2015     atorvastatin (LIPITOR) 40 MG tablet TAKE 1 TABLET (40 MG TOTAL) BY MOUTH ONCE A WEEK. 13 tablet 3   Blood Glucose Calibration (TRUE METRIX LEVEL 1) Low SOLN Use as directed 1 each 1   blood glucose meter kit and supplies Dispense based on patient and insurance preference. Use up to four times daily as directed. (FOR ICD-9 250.00, 250.01). 1 each 0   Blood Glucose Monitoring  Suppl (TRUE METRIX METER) w/Device KIT USE AS DIRECTED 1 kit 1   Calcium Carbonate-Vitamin D (CALCIUM-VITAMIN D3 PO) Take 1 tablet by mouth daily. Reported on 09/28/2015     Cyanocobalamin (B-12 PO) 5044m daily     glucose blood (TRUE METRIX BLOOD GLUCOSE TEST) test strip Use as instructed to check blood sugar before meals. 300 each 12   hydrochlorothiazide (HYDRODIURIL) 12.5 MG tablet TAKE 1 TABLET(12.5 MG) BY MOUTH DAILY 30 tablet 5   mesalamine (LIALDA) 1.2 g EC tablet Take 2 tablets (2.4 g total) by mouth 2 (two) times daily. 360 tablet 3   metFORMIN (GLUCOPHAGE) 1000 MG tablet TAKE 1 TABLET(1000 MG) BY MOUTH TWICE DAILY WITH A MEAL 180 tablet 3   Multiple Vitamins-Minerals (CENTRUM SILVER PO) Take 1 tablet by mouth daily. Reported on 09/28/2015     Omega-3 Fatty Acids (FISH OIL) 300 MG CAPS Take 300 mg by mouth daily.     TRUEplus Lancets 33G MISC USE TO CHECK SUGAR BEFORE MEALS 3 TIMES DAILY 300 each 0   ustekinumab (STELARA) 90 MG/ML SOSY injection Inject 1syringe under the skin every 8 weeks. 1 mL 6   No current facility-administered medications for this visit.     Objective:  BP 130/78   Pulse 73   Temp 97.8 F (36.6 C)   Ht 5' 11"  (1.803 m)  Wt 242 lb 9.6 oz (110 kg)   SpO2 98%   BMI 33.84 kg/m  Gen: NAD, resting comfortably CV: RRR no murmurs rubs or gallops Lungs: CTAB no crackles, wheeze, rhonchi Ext: trace edema Skin: warm, dry    Assessment and Plan   #Health maintenance-declines flu shot for now- may change mind later in season   #hypertension S: medication: amlodipine 5 mg, hctz 12.5 mg Home readings #s: getting better readings at home as long as watches diet A/P: Controlled. Continue current medications.   #hyperlipidemia S: Medication:Atorvastatin 40 mg weekly plus fish oil-prefers to take aspirin 81 mg (prefers to keep take)  Lab Results  Component Value Date   CHOL 115 08/10/2021   HDL 41.20 08/10/2021   LDLCALC 64 08/10/2021   LDLDIRECT 64.0  12/08/2019   TRIG 53.0 08/10/2021   CHOLHDL 3 08/10/2021   A/P: Excellent control on last check-continue current medication - one mild LFT elevation- trend with labs. Maybe 6 alcoholic beverages a week Lab Results  Component Value Date   ALT 42 08/10/2021   AST 62 (H) 08/10/2021   ALKPHOS 33 (L) 08/10/2021   BILITOT 0.9 08/10/2021   % Diabetes-follows with endocrinology with A1c typically under 7 S: Medication:Metformin 1000 mg twice daily - a1c came down and stopped repaglinide 0.5 mg with supper CBGs- 117 this am Exercise and diet- feels could increase his exercise- does some maybe once a week  Lab Results  Component Value Date   HGBA1C 6.2 (A) 08/21/2021   HGBA1C 5.6 02/20/2021   HGBA1C 6.4 (A) 08/19/2020  A/P: hopefully stable- update a1c today and forward to Dr. Cruzita Lederer. Continue current meds for now -I recommended also taking B12 since he is on Metformin- takes b12  #Ulcerative colitis-follows with GI S: Medication: Lialda, Stelara-immunosuppressed as result of every 8-week injection -humira lead to psoriasis A/P: has been stable- continue close GI follow up    #Pulmonary nodule follows with Dr. Melvyn Novas -patient has regular follow-up with pulmonology with CT scan typically in November (but see below) -Patient ended up needing PET scan June 09, 2020 and ultimately the most dangerous area was noted to be within his prostate and was referred to urology- another pet 06/01/21 with plan for 6 month follow up- hypermetabolic activity in prostate again- PSA has been ok - CT chest 02/07/22 results "Scattered bilateral solid and ground-glass nodules are again noted. Some of these nodules appear more confluent in the interval while others have clearly decreased in size and yet others are unchanged. Waxing and waning suggests underlying infectious/inflammatory etiology. Continued close follow-up warranted." And from Dr. Melvyn Novas- "Study is consistent with a benign pattern - no more CT's needed  - would be best to just do plain cxr's going forward with ov q 6 m x  2 years " - last urology visit I can see is October 2022- biopsy benign in 2022 but encouraged him to follow up with them to be on safe side  Recommended follow up: Return in about 6 months (around 08/13/2022) for physical or sooner if needed.Schedule b4 you leave. Future Appointments  Date Time Provider [place]  02/19/2022  8:20 AM Philemon Kingdom, MD [facility] None  03/20/2022  3:00 PM [facility] CCM PHARMACIST [facility]  06/25/2022  9:30 AM [facility] HEALTH COACH [facility]   Lab/Order associations:   ICD-10-CM   1. Essential hypertension  I10 Comprehensive metabolic panel    2. Hyperlipidemia associated with type 2 diabetes mellitus (HCC)  E11.69  E78.5     3. Controlled type 2 diabetes mellitus without complication, without long-term current use of insulin (HCC)  E11.9 Microalbumin / creatinine urine ratio    Comprehensive metabolic panel    Hemoglobin A1c      No orders of the defined types were placed in this encounter.   Return precautions advised.  [name], MD

## 2022-02-12 NOTE — Patient Instructions (Addendum)
Sign release of information at the check out desk for Diabetic Eye Exam.  call [facility] to schedule annual follow up- last visit October 2022 and you had another PET scan since that time Phone: [phone]   Flu shot (high dose) we should have these available within a month or two but please let us know if you get at outside pharmacy  Please stop by lab before you go If you have mychart- we will send your results within 3 business days of [place] receiving them.  If you do not have mychart- we will call you about results within 5 business days of [place] receiving them.  *please also note that you will see labs on mychart as soon as they post. I will later go in and write notes on them- will say "notes from Dr. Yong Channel"   Recommended follow up: Return in about 6 months (around 08/13/2022) for physical or sooner if needed.Schedule b4 you leave.

## 2022-02-12 NOTE — Telephone Encounter (Signed)
Patient is calling office to get results of blood work that was collected. Please advise sir of blood work results.   Thank you

## 2022-02-12 NOTE — Telephone Encounter (Signed)
Pt already has next visit scheduled.

## 2022-02-12 NOTE — Telephone Encounter (Signed)
Pt states: -following up about appts. -has appt "next month" with urologist.  No further follow up requested.

## 2022-02-12 NOTE — Telephone Encounter (Signed)
From after visit summary "If you have mychart- we will send your results within 3 business days of [place] receiving them. "  Please let patient know it takes time to review labs and I see patients during the daytime and usually cant review labs at night. Receiving messages requesting labs actually slows down everyone receiving labs.   See result note for actual lab results- just sent

## 2022-02-13 NOTE — Telephone Encounter (Signed)
Called and lm for pt tcb.

## 2022-02-14 NOTE — Telephone Encounter (Signed)
Called and spoke with pt and results reviewed.

## 2022-02-15 ENCOUNTER — Telehealth: Payer: Self-pay | Admitting: Internal Medicine

## 2022-02-15 NOTE — Telephone Encounter (Signed)
Patient called back to go over CT results.

## 2022-02-16 NOTE — Telephone Encounter (Signed)
Patient returning call for CT results.

## 2022-02-19 ENCOUNTER — Ambulatory Visit: Payer: Medicare HMO | Admitting: Internal Medicine

## 2022-02-19 ENCOUNTER — Encounter: Payer: Self-pay | Admitting: Internal Medicine

## 2022-02-19 VITALS — BP 130/88 | HR 84 | Ht 71.0 in | Wt 245.0 lb

## 2022-02-19 DIAGNOSIS — E1169 Type 2 diabetes mellitus with other specified complication: Secondary | ICD-10-CM

## 2022-02-19 DIAGNOSIS — E785 Hyperlipidemia, unspecified: Secondary | ICD-10-CM

## 2022-02-19 DIAGNOSIS — E1165 Type 2 diabetes mellitus with hyperglycemia: Secondary | ICD-10-CM

## 2022-02-19 MED ORDER — METFORMIN HCL 1000 MG PO TABS: ORAL_TABLET | ORAL | 3 refills | Status: DC

## 2022-02-19 NOTE — Patient Instructions (Addendum)
Please continue: - Metformin 1000 mg 2x a day, with meals  Check some sugars at bedtime or 2h after dinner.  Please return in 4 months.  PATIENT INSTRUCTIONS FOR TYPE 2 DIABETES:  DIET AND EXERCISE Diet and exercise is an important part of diabetic treatment.  We recommended aerobic exercise in the form of brisk walking (working between 40-60% of maximal aerobic capacity, similar to brisk walking) for 150 minutes per week (such as 30 minutes five days per week) along with 3 times per week performing 'resistance' training (using various gauge rubber tubes with handles) 5-10 exercises involving the major muscle groups (upper body, lower body and core) performing 10-15 repetitions (or near fatigue) each exercise. Start at half the above goal but build slowly to reach the above goals. If limited by weight, joint pain, or disability, we recommend daily walking in a swimming pool with water up to waist to reduce pressure from joints while allow for adequate exercise.    BLOOD GLUCOSES Monitoring your blood glucoses is important for continued management of your diabetes. Please check your blood glucoses 2-4 times a day: fasting, before meals and at bedtime (you can rotate these measurements - e.g. one day check before the 3 meals, the next day check before 2 of the meals and before bedtime, etc.).   HYPOGLYCEMIA (low blood sugar) Hypoglycemia is usually a reaction to not eating, exercising, or taking too much insulin/ other diabetes drugs.  Symptoms include tremors, sweating, hunger, confusion, headache, etc. Treat IMMEDIATELY with 15 grams of Carbs: 4 glucose tablets  cup regular juice/soda 2 tablespoons raisins 4 teaspoons sugar 1 tablespoon honey Recheck blood glucose in 15 mins and repeat above if still symptomatic/blood glucose <100.  RECOMMENDATIONS TO REDUCE YOUR RISK OF DIABETIC COMPLICATIONS: * Take your prescribed MEDICATION(S) * Follow a DIABETIC diet: Complex carbs, fiber rich  foods, (monounsaturated and polyunsaturated) fats * AVOID saturated/trans fats, high fat foods, >2,300 mg salt per day. * EXERCISE at least 5 times a week for 30 minutes or preferably daily.  * DO NOT SMOKE OR DRINK more than 1 drink a day. * Check your FEET every day. Do not wear tightfitting shoes. Contact us if you develop an ulcer * See your EYE doctor once a year or more if needed * Get a FLU shot once a year * Get a PNEUMONIA vaccine once before and once after age 12 years  GOALS:  * Your Hemoglobin A1c of <7%  * fasting sugars need to be 80-130 * after meals sugars need to be <180 (2h after you start eating) * Your Systolic BP should be 037 or lower  * Your Diastolic BP should be 80 or lower  * Your HDL (Good Cholesterol) should be 40 or higher  * Your LDL (Bad Cholesterol) should be 70 or lower. * Your Triglycerides should be 150 or lower  * Your Urine microalbumin (kidney function) should be <30 * Your Body Mass Index should be 25 or lower    Please consider the following ways to cut down carbs and fat and increase fiber and micronutrients in your diet: - substitute whole grain for white bread or pasta - substitute brown rice for white rice - substitute 90-calorie flat bread pieces for slices of bread when possible - substitute sweet potatoes or yams for white potatoes - substitute humus for margarine - substitute tofu for cheese when possible - substitute almond or rice milk for regular milk (would not drink soy milk daily due to concern  for soy estrogen influence on breast cancer risk) - substitute dark chocolate for other sweets when possible - substitute water - can add lemon or orange slices for taste - for diet sodas (artificial sweeteners will trick your body that you can eat sweets without getting calories and will lead you to overeating and weight gain in the long run) - do not skip breakfast or other meals (this will slow down the metabolism and will result in more  weight gain over time)  - can try smoothies made from fruit and almond/rice milk in am instead of regular breakfast - can also try old-fashioned (not instant) oatmeal made with almond/rice milk in am - order the dressing on the side when eating salad at a restaurant (pour less than half of the dressing on the salad) - eat as little meat as possible - can try juicing, but should not forget that juicing will get rid of the fiber, so would alternate with eating raw veg./fruits or drinking smoothies - use as little oil as possible, even when using olive oil - can dress a salad with a mix of balsamic vinegar and lemon juice, for e.g. - use agave nectar, stevia sugar, or regular sugar rather than artificial sweateners - steam or broil/roast veggies  - snack on veggies/fruit/nuts (unsalted, preferably) when possible, rather than processed foods - reduce or eliminate aspartame in diet (it is in diet sodas, chewing gum, etc) Read the labels!  Try to read Dr. Janene Harvey book: "Program for Reversing Diabetes" for other ideas for healthy eating.

## 2022-02-19 NOTE — Telephone Encounter (Signed)
Already addressed. Nothing further needed

## 2022-02-19 NOTE — Progress Notes (Signed)
Patient ID: Angel Frazier, male   DOB: 08/10/1955, 66 y.o.   MRN: [id number]  HPI: Angel Frazier is a 66 y.o.-year-old male, returning for follow-up for ketosis-prone diabetes, dx in 2017, non-insulin-dependent, controlled, without long-term complications. Pt. previously saw Dr. Loanne Drilling, last visit 6 months ago.  Reviewed HbA1c: Lab Results  Component Value Date   HGBA1C 6.7 (H) 02/12/2022   HGBA1C 6.2 (A) 08/21/2021   HGBA1C 5.6 02/20/2021   HGBA1C 6.4 (A) 08/19/2020   HGBA1C 7.1 (H) 08/09/2020   HGBA1C 6.7 (A) 02/15/2020   HGBA1C 6.1 (A) 10/12/2019   HGBA1C 6.3 (A) 06/15/2019   HGBA1C 5.9 (A) 02/23/2019   HGBA1C 6.7 (H) 12/04/2018   Pt is on a regimen of: - Metformin 1000 mg 2x a day, with meals Rybelsus was too expensive. He was on insulin after diagnosis but then was able to come off.  Pt checks his sugars 1-3x a day and they are: - am: 110-120, occas. 150-160 with higher carb intake for dinner - 2h after b'fast: n/c - before lunch: n/c - 2h after lunch: n/c - before dinner: 110-150 - 2h after dinner: n/c - bedtime: n/c - nighttime: n/c Lowest sugar was 95. Highest sugar was 160.  Glucometer: True Metrix  Pt's meals are: He saw nutrition in the past >> decreased carb intake afterwards.  - no CKD, last BUN/creatinine:  Lab Results  Component Value Date   BUN 17 02/12/2022   BUN 20 08/10/2021   CREATININE 0.99 02/12/2022   CREATININE 0.96 08/10/2021  He is not on ACE inhibitor/ARB.  -+ HL; last set of lipids: Lab Results  Component Value Date   CHOL 115 08/10/2021   HDL 41.20 08/10/2021   LDLCALC 64 08/10/2021   LDLDIRECT 64.0 12/08/2019   TRIG 53.0 08/10/2021   CHOLHDL 3 08/10/2021  On Lipitor 40 mg daily, omega-3 fatty acids.  - last eye exam was in 08/2021. No DR reportedly.  - no numbness and tingling in his feet.  Last foot exam 02/20/2021.  He has a history of ulcerative colitis, psoriasis, osteoarthritis, history of pancreatitis from 6-MP, spinal  stenosis, vitamin D deficiency.  ROS: + see HPI No increased urination, blurry vision, nausea, chest pain.  Past Medical History:  Diagnosis Date    vitamin D deficiency 01/24/2013   01/23/13 - level 29 - supplements 50k IU weekly x 6 recommended    Anabolic steroid abuse    PT. DENIES 03/30/19   Arthritis    Avascular necrosis of femoral head (HCC)    bilateral   Bleeding hemorrhoids    Diabetes mellitus without complication ([place]) 7829   type 2   Drug-induced acute pancreatitis - 6MP 09/07/2015   Erectile dysfunction    Hypertension    Left sided ulcerative colitis ([place])    Psoriasis caused by Humira 04/13/2019   Rhabdomyolysis 12/25/2018   Spinal stenosis of lumbar region at multiple levels    Past Surgical History:  Procedure Laterality Date   COLONOSCOPY     multiple   KNEE ARTHROSCOPY Right    PROSTATE BIOPSY  08/2020   Negative for cancer   SIGMOIDOSCOPY     Social History   Socioeconomic History   Marital status: Single    Spouse name: Not on file   Number of children: 1   Years of education: Not on file   Highest education level: Not on file  Occupational History   Occupation: retired    Fish farm manager: [employer]  Use   Smoking status: Former    Packs/day: 1.00    Years: 6.00    Total pack years: 6.00    Types: Cigarettes    Quit date: 06/04/2000    Years since quitting: 21.7   Smokeless tobacco: Never  Vaping Use   Vaping Use: Never used  Substance and Sexual Activity   Alcohol use: Yes    Comment: occasionally   Drug use: No   Sexual activity: Not Currently  Other Topics Concern   Not on file  Social History Narrative   Single never married. 1 son 23 in 2018. 1 grandson 31 in 2018. Lives in [place] alone      Disability- low back issues   Roll up machine operator      Hobbies: enjoys tinkering in his shed, enjoys going to the gym   Social Determinants of Health   Financial Resource Strain: Low Risk  (06/12/2021)    Overall Financial Resource Strain (CARDIA)    Difficulty of Paying Living Expenses: Not hard at all  Food Insecurity: No Food Insecurity (06/12/2021)   Hunger Vital Sign    Worried About Running Out of Food in the Last Year: Never true    Ran Out of Food in the Last Year: Never true  Transportation Needs: No Transportation Needs (06/12/2021)   PRAPARE - Hydrologist (Medical): No    Lack of Transportation (Non-Medical): No  Physical Activity: Inactive (06/12/2021)   Exercise Vital Sign    Days of Exercise per Week: 0 days    Minutes of Exercise per Session: 0 min  Stress: No Stress Concern Present (06/12/2021)   [place]    Feeling of Stress : Not at all  Social Connections: Moderately Isolated (06/12/2021)   Social Connection and Isolation Panel [NHANES]    Frequency of Communication with Friends and Family: More than three times a week    Frequency of Social Gatherings with Friends and Family: More than three times a week    Attends Religious Services: More than 4 times per year    Active Member of [employer] or Organizations: No    Attends Archivist Meetings: Never    Marital Status: Never married  Intimate Partner Violence: Not At Risk (06/12/2021)   Humiliation, Afraid, Rape, and Kick questionnaire    Fear of Current or Ex-Partner: No    Emotionally Abused: No    Physically Abused: No    Sexually Abused: No   Current Outpatient Medications on File Prior to Visit  Medication Sig Dispense Refill   Alcohol Swabs (B-D SINGLE USE SWABS REGULAR) PADS USE AS DIRECTED OR NEEDED 300 each 12   amLODipine (NORVASC) 5 MG tablet Take 1 tablet (5 mg total) by mouth at bedtime. 10 tablet 0   Ascorbic Acid (VITAMIN C) 1000 MG tablet Take 1,000 mg by mouth daily. Reported on 09/28/2015     aspirin 81 MG tablet Take 81 mg by mouth daily. Reported on 09/28/2015     atorvastatin (LIPITOR) 40 MG tablet TAKE  1 TABLET (40 MG TOTAL) BY MOUTH ONCE A WEEK. 13 tablet 3   Blood Glucose Calibration (TRUE METRIX LEVEL 1) Low SOLN Use as directed 1 each 1   blood glucose meter kit and supplies Dispense based on patient and insurance preference. Use up to four times daily as directed. (FOR ICD-9 250.00, 250.01). 1 each 0   Blood Glucose Monitoring  Suppl (TRUE METRIX METER) w/Device KIT USE AS DIRECTED 1 kit 1   Calcium Carbonate-Vitamin D (CALCIUM-VITAMIN D3 PO) Take 1 tablet by mouth daily. Reported on 09/28/2015     Cyanocobalamin (B-12 PO) 5072m daily     glucose blood (TRUE METRIX BLOOD GLUCOSE TEST) test strip Use as instructed to check blood sugar before meals. 300 each 12   hydrochlorothiazide (HYDRODIURIL) 12.5 MG tablet TAKE 1 TABLET(12.5 MG) BY MOUTH DAILY 30 tablet 5   mesalamine (LIALDA) 1.2 g EC tablet Take 2 tablets (2.4 g total) by mouth 2 (two) times daily. 360 tablet 3   metFORMIN (GLUCOPHAGE) 1000 MG tablet TAKE 1 TABLET(1000 MG) BY MOUTH TWICE DAILY WITH A MEAL 180 tablet 3   Multiple Vitamins-Minerals (CENTRUM SILVER PO) Take 1 tablet by mouth daily. Reported on 09/28/2015     Omega-3 Fatty Acids (FISH OIL) 300 MG CAPS Take 300 mg by mouth daily.     TRUEplus Lancets 33G MISC USE TO CHECK SUGAR BEFORE MEALS 3 TIMES DAILY 300 each 0   ustekinumab (STELARA) 90 MG/ML SOSY injection Inject 1syringe under the skin every 8 weeks. 1 mL 6   No current facility-administered medications on file prior to visit.   Allergies  Allergen Reactions   Humira [Adalimumab] Rash    psoriasis   Mercaptopurine Other (See Comments)    Caused Pancreatitis   Family History  Problem Relation Age of Onset   Diabetes Mother    Breast cancer Sister    Diabetes Other        cousin   Hypertension Other    Diabetes type I Son    Colon cancer Neg Hx    Esophageal cancer Neg Hx    Rectal cancer Neg Hx    Stomach cancer Neg Hx    Colon polyps Neg Hx    PE: BP 130/88 (BP Location: Left Arm, Patient Position:  Sitting, Cuff Size: Normal)   Pulse 84   Ht _0  (1.803 m)   Wt 245 lb (111.1 kg)   SpO2 98%   BMI 34.17 kg/m  Wt Readings from Last 3 Encounters:  02/19/22 245 lb (111.1 kg)  02/12/22 242 lb 9.6 oz (110 kg)  08/21/21 254 lb 9.6 oz (115.5 kg)   Constitutional: overweight, in NAD Eyes: no exophthalmos ENT: moist mucous membranes, no thyromegaly, no cervical lymphadenopathy Cardiovascular: RRR, No MRG Respiratory: CTA B Musculoskeletal: no deformities Skin: moist, warm, no rashes Neurological: no tremor with outstretched hands Diabetic Foot Exam - Simple   Simple Foot Form Diabetic Foot exam was performed with the following findings: Yes 02/19/2022  8:39 AM  Visual Inspection No deformities, no ulcerations, no other skin breakdown bilaterally: Yes Sensation Testing Intact to touch and monofilament testing bilaterally: Yes Pulse Check Posterior Tibialis and Dorsalis pulse intact bilaterally: Yes Comments    ASSESSMENT: 1.  Ketosis-prone diabetes ([place]), non-insulin-dependent, controlled, without long-term complications, but with hyperglycemia He has ED -unknown if related to diabetes.  2. HL  PLAN:  1. Patient with long-standing, controlled diabetes, on oral antidiabetic regimen with metformin only, with good control.  Per review of Dr. ECordelia Pennotes, he previously had DKA, but has not required insulin long-term. Latest HbA1c obtained a week ago was excellent, at 6.7%. - At today's visit, we reviewed together his blood sugars at home.  They are excellent in the morning, unless he has dietary indiscretions the night before.  However, this does not happen frequently.  He also checks before dinner and these are  at or slightly above goal, depending on whether he had a snack in the afternoon or not.  For now, I do not have reasons to change his regimen but I did advise him to check some sugars 2 hours after dinner or at bedtime to see if they increase significantly after dinner. -  I suggested to:  Patient Instructions  Please continue: - Metformin 1000 mg 2x a day, with meals  Check some sugars at bedtime or 2h after dinner.  Please return in 4 months.  - check sugars at different times of the day - check 1x a day, rotating checks - discussed about CBG targets for treatment: 80-130 mg/dL before meals and <180 mg/dL after meals; target HbA1c <7%. - given foot care handout and reviewed it together - given instructions for hypoglycemia management "15-15 rule"  - advised for yearly eye exams  - Return to clinic in 4 mo    2. HL - Reviewed latest lipid panel from 6 months ago: Fractions at goal: Lab Results  Component Value Date   CHOL 115 08/10/2021   HDL 41.20 08/10/2021   LDLCALC 64 08/10/2021   LDLDIRECT 64.0 12/08/2019   TRIG 53.0 08/10/2021   CHOLHDL 3 08/10/2021  - Continues Lipitor 40 mg daily, omega-3 fatty acids 300 mg daily without side effects.  Philemon Kingdom, MD PhD [facility]

## 2022-02-19 NOTE — Progress Notes (Signed)
Spoke with pt and notified of results per Dr. Wert. Pt verbalized understanding and denied any questions. 

## 2022-03-14 ENCOUNTER — Other Ambulatory Visit: Payer: Self-pay

## 2022-03-14 DIAGNOSIS — E1165 Type 2 diabetes mellitus with hyperglycemia: Secondary | ICD-10-CM

## 2022-03-14 MED ORDER — TRUE METRIX BLOOD GLUCOSE TEST VI STRP: ORAL_STRIP | 12 refills | Status: AC

## 2022-03-14 MED ORDER — TRUEPLUS LANCETS 33G MISC: 0 refills | Status: DC

## 2022-03-15 ENCOUNTER — Other Ambulatory Visit: Payer: Self-pay

## 2022-03-15 DIAGNOSIS — E1165 Type 2 diabetes mellitus with hyperglycemia: Secondary | ICD-10-CM

## 2022-03-15 MED ORDER — METFORMIN HCL 1000 MG PO TABS: ORAL_TABLET | ORAL | 3 refills | Status: DC

## 2022-03-15 MED ORDER — HYDROCHLOROTHIAZIDE 12.5 MG PO TABS: ORAL_TABLET | ORAL | 3 refills | Status: DC

## 2022-03-20 ENCOUNTER — Telehealth: Payer: Medicare HMO

## 2022-03-26 ENCOUNTER — Telehealth: Payer: Self-pay | Admitting: Internal Medicine

## 2022-03-26 DIAGNOSIS — R351 Nocturia: Secondary | ICD-10-CM | POA: Diagnosis not present

## 2022-03-26 DIAGNOSIS — N5201 Erectile dysfunction due to arterial insufficiency: Secondary | ICD-10-CM | POA: Diagnosis not present

## 2022-03-26 DIAGNOSIS — N401 Enlarged prostate with lower urinary tract symptoms: Secondary | ICD-10-CM | POA: Diagnosis not present

## 2022-03-26 MED ORDER — PREDNISONE 10 MG PO TABS: ORAL_TABLET | ORAL | 0 refills | Status: AC

## 2022-03-26 NOTE — Telephone Encounter (Signed)
Please ask if any antibiotics in past 2-3 months and any sick contacts with diarrhea

## 2022-03-26 NOTE — Telephone Encounter (Signed)
Inbound call from patient states his colitis has flared up. Please advise

## 2022-03-26 NOTE — Telephone Encounter (Signed)
Pt stated that he has not taking any antibiotics in the  past 2-3 months nor has he been around any one who was sick with diarrhea: Please advise

## 2022-03-26 NOTE — Telephone Encounter (Signed)
Pt was made aware of Dr. Carlean Purl recommendations: Pt made aware of prescription that was sent in to pharmacy: Pt was scheduled for an office visit with Dr. Carlean Purl on 05/04/2022 at 8:50 AM: Pt made aware:  Pt verbalized understanding with all questions answered.

## 2022-03-26 NOTE — Telephone Encounter (Signed)
I sent a prednisone taper in  He needs a f/u me in about 1 month - can use an RN visit or 1130 or 350 or a banding slot

## 2022-03-26 NOTE — Telephone Encounter (Signed)
Pt stated that starting 8 days ago he started having 3-5 liquid BM a day.  Pt states that he is not having any pain, no fever, no N/V.  Pt is on Stelara and has not missed any doses:  Please advise

## 2022-03-30 ENCOUNTER — Encounter: Payer: Self-pay | Admitting: Home Health

## 2022-03-30 ENCOUNTER — Encounter: Payer: Self-pay | Admitting: Internal Medicine

## 2022-05-04 ENCOUNTER — Other Ambulatory Visit (INDEPENDENT_AMBULATORY_CARE_PROVIDER_SITE_OTHER): Payer: Medicare HMO

## 2022-05-04 ENCOUNTER — Ambulatory Visit: Payer: Medicare HMO | Admitting: Internal Medicine

## 2022-05-04 ENCOUNTER — Encounter: Payer: Self-pay | Admitting: Internal Medicine

## 2022-05-04 DIAGNOSIS — K51018 Ulcerative (chronic) pancolitis with other complication: Secondary | ICD-10-CM | POA: Diagnosis not present

## 2022-05-04 DIAGNOSIS — Z7960 Long term (current) use of unspecified immunomodulators and immunosuppressants: Secondary | ICD-10-CM

## 2022-05-04 LAB — CBC WITH DIFFERENTIAL/PLATELET
Basophils Absolute: 0 10*3/uL (ref 0.0–0.1)
Basophils Relative: 0.6 % (ref 0.0–3.0)
Eosinophils Absolute: 0.1 10*3/uL (ref 0.0–0.7)
Eosinophils Relative: 2.5 % (ref 0.0–5.0)
HCT: 40.4 % (ref 39.0–52.0)
Hemoglobin: 13.6 g/dL (ref 13.0–17.0)
Lymphocytes Relative: 30.6 % (ref 12.0–46.0)
Lymphs Abs: 1.7 10*3/uL (ref 0.7–4.0)
MCHC: 33.7 g/dL (ref 30.0–36.0)
MCV: 96.2 fl (ref 78.0–100.0)
Monocytes Absolute: 0.6 10*3/uL (ref 0.1–1.0)
Monocytes Relative: 10.5 % (ref 3.0–12.0)
Neutro Abs: 3 10*3/uL (ref 1.4–7.7)
Neutrophils Relative %: 55.8 % (ref 43.0–77.0)
Platelets: 194 10*3/uL (ref 150.0–400.0)
RBC: 4.2 Mil/uL — ABNORMAL LOW (ref 4.22–5.81)
RDW: 12.9 % (ref 11.5–15.5)
WBC: 5.5 10*3/uL (ref 4.0–10.5)

## 2022-05-04 NOTE — Progress Notes (Signed)
Angel Frazier 66 y.o. 13-Jan-1956 [id number]  Assessment & Plan:   Encounter Diagnoses  Name Primary?   Universal ulcerative colitis with other complication ([place]) Yes   Long-term use of immunosuppressant medication Stelara    Overall he has been improved.  He had a brief flare in October, he has been on ustekinumab since late 2022.  He is tolerating this biologic immunosuppressive therapy well as best I can tell.  Unfortunately he did have this small flare though in the past has had about 2 a year or so where hoping this is progress.  Continues on mesalamine.  He will have a colonoscopy in February.  QuantiFERON and CBC will be drawn today.  CC: [name], MD   Subjective:    Gastroenterology summary 03/14/04 colonoscopy left UC, asacol initiated No active disease on sigmoidoscopy in 2012 Maintained on mesalamine 4.8 g daily, Asacol HD and then Lialda 12/2012 - flare Tx prednisone 2015-16 - prednisone required 2017-18 prednisone 3-4 x hypersensitivity with mercaptopurine Humira initiated spring 2020 Humira stopped late 2020 due to psoriasis Late 2022, Stelara begun after colonoscopy demonstrated active left and some microscopic right colitis Patient gets assistance with mesalamine and Stelara      Chief Complaint: Follow-up of ulcerative colitis  HPI Angel Frazier is a 66 year old African-American man with prior left-sided ulcerative colitis that is transformed in the universal ulcerative colitis, he has had psoriasis with Humira and intolerance to immunomodulators and is now on Stelara since late 2022 and continued on mesalamine.  In October he had a flare of diarrhea and he was prescribed 28-day prednisone taper and responded rapidly and feels back to normal now.  Overall things have gone well without side effects from the Stelara.  Last colonoscopy was in September 2022 with active colitis though it was microscopic on the right side. Allergies  Allergen Reactions   Humira  [Adalimumab] Rash    psoriasis   Mercaptopurine Other (See Comments)    Caused Pancreatitis   Current Meds  Medication Sig   Alcohol Swabs (B-D SINGLE USE SWABS REGULAR) PADS USE AS DIRECTED OR NEEDED   amLODipine (NORVASC) 5 MG tablet Take 1 tablet (5 mg total) by mouth at bedtime.   Ascorbic Acid (VITAMIN C) 1000 MG tablet Take 1,000 mg by mouth daily. Reported on 09/28/2015   aspirin 81 MG tablet Take 81 mg by mouth daily. Reported on 09/28/2015   atorvastatin (LIPITOR) 40 MG tablet TAKE 1 TABLET (40 MG TOTAL) BY MOUTH ONCE A WEEK.   Blood Glucose Calibration (TRUE METRIX LEVEL 1) Low SOLN Use as directed   blood glucose meter kit and supplies Dispense based on patient and insurance preference. Use up to four times daily as directed. (FOR ICD-9 250.00, 250.01).   Blood Glucose Monitoring Suppl (TRUE METRIX METER) w/Device KIT USE AS DIRECTED   Calcium Carbonate-Vitamin D (CALCIUM-VITAMIN D3 PO) Take 1 tablet by mouth daily. Reported on 09/28/2015   Cyanocobalamin (B-12 PO) 5072m daily   glucose blood (TRUE METRIX BLOOD GLUCOSE TEST) test strip Use as instructed to check blood sugar before meals.   hydrochlorothiazide (HYDRODIURIL) 12.5 MG tablet TAKE 1 TABLET(12.5 MG) BY MOUTH DAILY   mesalamine (LIALDA) 1.2 g EC tablet Take 2 tablets (2.4 g total) by mouth 2 (two) times daily.   metFORMIN (GLUCOPHAGE) 1000 MG tablet TAKE 1 TABLET(1000 MG) BY MOUTH TWICE DAILY WITH A MEAL   Multiple Vitamins-Minerals (CENTRUM SILVER PO) Take 1 tablet by mouth daily. Reported on 09/28/2015   Omega-3  Fatty Acids (FISH OIL) 300 MG CAPS Take 300 mg by mouth daily.   TRUEplus Lancets 33G MISC USE TO CHECK SUGAR BEFORE MEALS 3 TIMES DAILY   ustekinumab (STELARA) 90 MG/ML SOSY injection Inject 1syringe under the skin every 8 weeks.   Past Medical History:  Diagnosis Date    vitamin D deficiency 01/24/2013   01/23/13 - level 29 - supplements 50k IU weekly x 6 recommended    Anabolic steroid abuse    PT. DENIES  03/30/19   Arthritis    Avascular necrosis of femoral head (HCC)    bilateral   Bleeding hemorrhoids    Diabetes mellitus without complication ([place]) 2426   type 2   Drug-induced acute pancreatitis - 6MP 09/07/2015   Erectile dysfunction    Hypertension    Left sided ulcerative colitis ([place])    Psoriasis caused by Humira 04/13/2019   Rhabdomyolysis 12/25/2018   Spinal stenosis of lumbar region at multiple levels    Past Surgical History:  Procedure Laterality Date   COLONOSCOPY     multiple   KNEE ARTHROSCOPY Right    PROSTATE BIOPSY  08/2020   Negative for cancer   SIGMOIDOSCOPY     Social History   Social History Narrative   Single never married. 1 son 68 in 2018. 1 grandson 44 in 2018. 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   Review of Systems As per HPI  Objective:   Physical Exam _0  130/82   Pulse 88   Ht _1  (1.803 m)   Wt 252 lb (114.3 kg)   BMI 35.15 kg/m @  General:  NAD Eyes:   anicteric Lungs:  clear Heart::  S1S2 no rubs, murmurs or gallops Abdomen:  soft and nontender, BS+ small umbilical hernia Ext:   no edema, cyanosis or clubbing    Data Reviewed:  See HPI I have also reviewed recent primary care and endocrinology notes within the last few months.  Also pulmonary findings, has chronic lung nodules thought to be inflammatory.

## 2022-05-04 NOTE — Patient Instructions (Addendum)
_______________________________________________________  If you are age 68 or older, your body mass index should be between 23-30. Your Body mass index is 35.15 kg/m. If this is out of the aforementioned range listed, please consider follow up with your Primary Care Provider. ________________________________________________________  The [place] [facility] providers would like to encourage you to use [facility] to communicate with providers for non-urgent requests or questions.  Due to long hold times on the telephone, sending your provider a message by [facility] may be a faster and more efficient way to get a response.  Please allow 48 business hours for a response.  Please remember that this is for non-urgent requests.  _______________________________________________________  Your provider has requested that you go to the basement level for lab work before leaving today. Press "B" on the elevator. The lab is located at the first door on the left as you exit the elevator.  You have been scheduled for a colonoscopy. Please follow written instructions given to you at your visit today.  Please pick up your prep supplies at the pharmacy within the next 1-3 days. If you use inhalers (even only as needed), please bring them with you on the day of your procedure.  Due to recent changes in healthcare laws, you may see the results of your imaging and laboratory studies on MyChart before your provider has had a chance to review them.  We understand that in some cases there may be results that are confusing or concerning to you. Not all laboratory results come back in the same time frame and the provider may be waiting for multiple results in order to interpret others.  Please give [place] 48 hours in order for your provider to thoroughly review all the results before contacting the office for clarification of your results.   Thank you for entrusting me with your care and choosing [facility].  Dr Silvano Rusk

## 2022-05-06 LAB — QUANTIFERON-TB GOLD PLUS
Mitogen-NIL: >10 IU/mL
NIL: 0.03 IU/mL
QuantiFERON-TB Gold Plus: NEGATIVE
TB1-NIL: 0.01 IU/mL
TB2-NIL: 0 IU/mL

## 2022-05-08 ENCOUNTER — Telehealth: Payer: Self-pay | Admitting: Internal Medicine

## 2022-05-08 NOTE — Telephone Encounter (Signed)
Left message for pt to call back: Documentation under result notes

## 2022-05-08 NOTE — Telephone Encounter (Signed)
Patient returning your call regarding labs.

## 2022-05-29 ENCOUNTER — Other Ambulatory Visit: Payer: Self-pay

## 2022-05-29 ENCOUNTER — Telehealth: Payer: Self-pay | Admitting: Pharmacy Technician

## 2022-05-29 DIAGNOSIS — K51018 Ulcerative (chronic) pancolitis with other complication: Secondary | ICD-10-CM

## 2022-05-29 MED ORDER — USTEKINUMAB 90 MG/ML ~~LOC~~ SOSY: PREFILLED_SYRINGE | SUBCUTANEOUS | 6 refills | Status: DC

## 2022-05-29 NOTE — Telephone Encounter (Signed)
Please see note below: Just FYI

## 2022-05-29 NOTE — Telephone Encounter (Signed)
Pharmacy has requested new script be sent for Stelara. Please send electronically per [place]. Phone: [phone]

## 2022-05-29 NOTE — Telephone Encounter (Signed)
Prescription sent electronically to [facility].

## 2022-06-11 ENCOUNTER — Encounter: Payer: Self-pay | Admitting: Internal Medicine

## 2022-06-11 ENCOUNTER — Ambulatory Visit: Payer: Medicare HMO | Admitting: Internal Medicine

## 2022-06-11 VITALS — BP 138/88 | HR 94 | Ht 71.0 in | Wt 256.8 lb

## 2022-06-11 DIAGNOSIS — E1165 Type 2 diabetes mellitus with hyperglycemia: Secondary | ICD-10-CM | POA: Diagnosis not present

## 2022-06-11 DIAGNOSIS — E785 Hyperlipidemia, unspecified: Secondary | ICD-10-CM | POA: Diagnosis not present

## 2022-06-11 DIAGNOSIS — E1169 Type 2 diabetes mellitus with other specified complication: Secondary | ICD-10-CM | POA: Diagnosis not present

## 2022-06-11 LAB — POCT GLYCOSYLATED HEMOGLOBIN (HGB A1C): Hemoglobin A1C: 6.4 % — AB (ref 4.0–5.6)

## 2022-06-11 NOTE — Progress Notes (Signed)
Patient ID: Angel Frazier, male   DOB: 1955-07-31, 67 y.o.   MRN: [id number]  HPI: Angel Frazier is a 67 y.o.-year-old male, returning for follow-up for ketosis-prone diabetes, dx in 2017, non-insulin-dependent, controlled, without long-term complications. Pt. previously saw Dr. Loanne Drilling, but last visit with me 4 months ago.  Interim history: No increased urination, blurry vision, nausea, chest pain.  He gained 12 lbs since last OV. He had a URI recently >> could not go to the gym.  Reviewed HbA1c: Lab Results  Component Value Date   HGBA1C 6.7 (H) 02/12/2022   HGBA1C 6.2 (A) 08/21/2021   HGBA1C 5.6 02/20/2021   HGBA1C 6.4 (A) 08/19/2020   HGBA1C 7.1 (H) 08/09/2020   HGBA1C 6.7 (A) 02/15/2020   HGBA1C 6.1 (A) 10/12/2019   HGBA1C 6.3 (A) 06/15/2019   HGBA1C 5.9 (A) 02/23/2019   HGBA1C 6.7 (H) 12/04/2018   Pt is on a regimen of: - Metformin 1000 mg 2x a day, with meals Rybelsus was too expensive. He was on insulin after diagnosis but then was able to come off.  Pt checks his sugars 1-3x a day and they are: - am: 110-120, occas. 150-160 with higher carb intake for dinner >> 110-115, 144 (steak) - 2h after b'fast: n/c - before lunch: n/c - 2h after lunch: n/c - before dinner: 110-150 >> n/c - 2h after dinner: n/c - bedtime: n/c - nighttime: n/c Lowest sugar was 95 >> 100. Highest sugar was 160 >> 144.  Glucometer: True Metrix  He saw nutrition in the past >> decreased carb intake afterwards.  - no CKD, last BUN/creatinine:  Lab Results  Component Value Date   BUN 17 02/12/2022   BUN 20 08/10/2021   CREATININE 0.99 02/12/2022   CREATININE 0.96 08/10/2021  He is not on ACE inhibitor/ARB.  -+ HL; last set of lipids: Lab Results  Component Value Date   CHOL 115 08/10/2021   HDL 41.20 08/10/2021   LDLCALC 64 08/10/2021   LDLDIRECT 64.0 12/08/2019   TRIG 53.0 08/10/2021   CHOLHDL 3 08/10/2021  On Lipitor 40 mg daily, omega-3 fatty acids.  - last eye exam was in  08/2021. No DR reportedly.  - no numbness and tingling in his feet.  Last foot exam 02/19/2022.  He has a history of ulcerative colitis, psoriasis, osteoarthritis, history of pancreatitis from 6-MP, spinal stenosis, vitamin D deficiency.  ROS: + see HPI  Past Medical History:  Diagnosis Date    vitamin D deficiency 01/24/2013   01/23/13 - level 29 - supplements 50k IU weekly x 6 recommended    Anabolic steroid abuse    PT. DENIES 03/30/19   Arthritis    Avascular necrosis of femoral head (HCC)    bilateral   Bleeding hemorrhoids    Diabetes mellitus without complication ([place]) 0347   type 2   Drug-induced acute pancreatitis - 6MP 09/07/2015   Erectile dysfunction    Hypertension    Left sided ulcerative colitis ([place])    Psoriasis caused by Humira 04/13/2019   Rhabdomyolysis 12/25/2018   Spinal stenosis of lumbar region at multiple levels    Past Surgical History:  Procedure Laterality Date   COLONOSCOPY     multiple   KNEE ARTHROSCOPY Right    PROSTATE BIOPSY  08/2020   Negative for cancer   SIGMOIDOSCOPY     Social History   Socioeconomic History   Marital status: Single    Spouse name: Not on file   Number of children: 1  Years of education: Not on file   Highest education level: Not on file  Occupational History   Occupation: retired    Fish farm manager: [employer]  Tobacco Use   Smoking status: Former    Packs/day: 1.00    Years: 6.00    Total pack years: 6.00    Types: Cigarettes    Quit date: 06/04/2000    Years since quitting: 22.0   Smokeless tobacco: Never  Vaping Use   Vaping Use: Never used  Substance and Sexual Activity   Alcohol use: Yes    Comment: occasionally   Drug use: No   Sexual activity: Not Currently  Other Topics Concern   Not on file  Social History Narrative   Single never married. 1 son 58 in 2018. 1 grandson 3 in 2018. Lives in [place] alone      Disability- low back issues   Roll up machine operator       Hobbies: enjoys tinkering in his shed, enjoys going to the gym   Social Determinants of Health   Financial Resource Strain: Low Risk  (06/12/2021)   Overall Financial Resource Strain (CARDIA)    Difficulty of Paying Living Expenses: Not hard at all  Food Insecurity: No Food Insecurity (06/12/2021)   Hunger Vital Sign    Worried About Running Out of Food in the Last Year: Never true    Ran Out of Food in the Last Year: Never true  Transportation Needs: No Transportation Needs (06/12/2021)   PRAPARE - Hydrologist (Medical): No    Lack of Transportation (Non-Medical): No  Physical Activity: Inactive (06/12/2021)   Exercise Vital Sign    Days of Exercise per Week: 0 days    Minutes of Exercise per Session: 0 min  Stress: No Stress Concern Present (06/12/2021)   [place]    Feeling of Stress : Not at all  Social Connections: Moderately Isolated (06/12/2021)   Social Connection and Isolation Panel [NHANES]    Frequency of Communication with Friends and Family: More than three times a week    Frequency of Social Gatherings with Friends and Family: More than three times a week    Attends Religious Services: More than 4 times per year    Active Member of [employer] or Organizations: No    Attends Archivist Meetings: Never    Marital Status: Never married  Intimate Partner Violence: Not At Risk (06/12/2021)   Humiliation, Afraid, Rape, and Kick questionnaire    Fear of Current or Ex-Partner: No    Emotionally Abused: No    Physically Abused: No    Sexually Abused: No   Current Outpatient Medications on File Prior to Visit  Medication Sig Dispense Refill   Alcohol Swabs (B-D SINGLE USE SWABS REGULAR) PADS USE AS DIRECTED OR NEEDED 300 each 12   amLODipine (NORVASC) 5 MG tablet Take 1 tablet (5 mg total) by mouth at bedtime. 10 tablet 0   Ascorbic Acid (VITAMIN C) 1000 MG tablet Take 1,000 mg by  mouth daily. Reported on 09/28/2015     aspirin 81 MG tablet Take 81 mg by mouth daily. Reported on 09/28/2015     atorvastatin (LIPITOR) 40 MG tablet TAKE 1 TABLET (40 MG TOTAL) BY MOUTH ONCE A WEEK. 13 tablet 3   Blood Glucose Calibration (TRUE METRIX LEVEL 1) Low SOLN Use as directed 1 each 1   blood glucose  meter kit and supplies Dispense based on patient and insurance preference. Use up to four times daily as directed. (FOR ICD-9 250.00, 250.01). 1 each 0   Blood Glucose Monitoring Suppl (TRUE METRIX METER) w/Device KIT USE AS DIRECTED 1 kit 1   Calcium Carbonate-Vitamin D (CALCIUM-VITAMIN D3 PO) Take 1 tablet by mouth daily. Reported on 09/28/2015     Cyanocobalamin (B-12 PO) '5000mg'$  daily     glucose blood (TRUE METRIX BLOOD GLUCOSE TEST) test strip Use as instructed to check blood sugar before meals. 300 each 12   hydrochlorothiazide (HYDRODIURIL) 12.5 MG tablet TAKE 1 TABLET(12.5 MG) BY MOUTH DAILY 90 tablet 3   mesalamine (LIALDA) 1.2 g EC tablet Take 2 tablets (2.4 g total) by mouth 2 (two) times daily. 360 tablet 3   metFORMIN (GLUCOPHAGE) 1000 MG tablet TAKE 1 TABLET(1000 MG) BY MOUTH TWICE DAILY WITH A MEAL 180 tablet 3   Multiple Vitamins-Minerals (CENTRUM SILVER PO) Take 1 tablet by mouth daily. Reported on 09/28/2015     Omega-3 Fatty Acids (FISH OIL) 300 MG CAPS Take 300 mg by mouth daily.     TRUEplus Lancets 33G MISC USE TO CHECK SUGAR BEFORE MEALS 3 TIMES DAILY 300 each 0   ustekinumab (STELARA) 90 MG/ML SOSY injection Inject 1syringe under the skin every 8 weeks. 1 mL 6   No current facility-administered medications on file prior to visit.   Allergies  Allergen Reactions   Humira [Adalimumab] Rash    psoriasis   Mercaptopurine Other (See Comments)    Caused Pancreatitis   Family History  Problem Relation Age of Onset   Diabetes Mother    Breast cancer Sister    Diabetes Other        cousin   Hypertension Other    Diabetes type I Son    Colon cancer Neg Hx     Esophageal cancer Neg Hx    Rectal cancer Neg Hx    Stomach cancer Neg Hx    Colon polyps Neg Hx    PE: BP 138/88 (BP Location: Left Arm, Patient Position: Sitting, Cuff Size: Normal)   Pulse 94   Ht '5\' 11"'$  (1.803 m)   Wt 256 lb 12.8 oz (116.5 kg)   SpO2 98%   BMI 35.82 kg/m  Wt Readings from Last 3 Encounters:  06/11/22 256 lb 12.8 oz (116.5 kg)  05/04/22 252 lb (114.3 kg)  02/19/22 245 lb (111.1 kg)   Constitutional: overweight, in NAD Eyes:  EOMI, no exophthalmos ENT: no neck masses, no cervical lymphadenopathy Cardiovascular: RRR (no tachycardia at the time of the physical exam), No MRG Respiratory: CTA B Musculoskeletal: no deformities Skin:no rashes Neurological: no tremor with outstretched hands  ASSESSMENT: 1.  Ketosis-prone diabetes ([place]), non-insulin-dependent, controlled, without long-term complications, but with hyperglycemia He has ED -unknown if related to diabetes.  2. HL  PLAN:  1. Patient with longstanding, uncontrolled, type 2 diabetes, on oral antidiabetic regimen with metformin only, with good control.  Per review of Dr. Cordelia Pen notes, he had DKA before, but has not required insulin long-term. - At last visit, HbA1c was slightly higher, at 6.7%.  Sugars are excellent in the morning unless he has dietary indiscretions the night before, but this did not happen frequently.  He was checking sugars before dinner and these were slightly above goal, depending on whether he had a snack in the afternoon or not.  We did not change his regimen at last visit but I did advise him to try to  check some blood sugars 2 hours after dinner, or at bedtime to see if they were increasing significantly. -At today's visit, he is still only checking sugars in the morning as he eats dinner late and does not stay to check his blood sugars in the evening.  I advised him to try to do so few times before coming to see me next.  For now, however, he does not need a change in regimen, based  on the excellent HbA1c (see below). -We discussed about the need to improve diet, as he had a 12 pound weight gain since last visit.  He attributes this to the holidays and the fact that he could not go to the gym due to her URI.  He plans to restart. - I suggested to:  Patient Instructions  Please continue: - Metformin 1000 mg 2x a day, with meals  Check some sugars at bedtime or 2h after dinner.  Please return in 4 months.  - we checked his HbA1c: 6.4% (lower) - advised to check sugars at different times of the day - 1x a day, rotating check times - advised for yearly eye exams >> he is UTD - return to clinic in 4 months  2. HL -Reviewed latest lipid panel from 08/2021: All fractions at goal: Lab Results  Component Value Date   CHOL 115 08/10/2021   HDL 41.20 08/10/2021   LDLCALC 64 08/10/2021   LDLDIRECT 64.0 12/08/2019   TRIG 53.0 08/10/2021   CHOLHDL 3 08/10/2021  -He continues on Lipitor 40 mg daily, omega-3 fatty acids - without side effects - he has an AP with visit in 08/2022.E  Philemon Kingdom, MD PhD [facility] Endocrinology

## 2022-06-11 NOTE — Patient Instructions (Addendum)
Please continue: - Metformin 1000 mg 2x a day, with meals  Check some sugars at bedtime or 2h after dinner.  Please return in 6 months.

## 2022-06-18 ENCOUNTER — Telehealth: Payer: Self-pay | Admitting: Internal Medicine

## 2022-06-18 NOTE — Telephone Encounter (Signed)
Left message for pt to call back  °

## 2022-06-18 NOTE — Telephone Encounter (Signed)
Patient called regarding paperwork that needs to be submitted for the Lialda medication.

## 2022-06-18 NOTE — Telephone Encounter (Signed)
Pt stated that he is on his last bottle of the Lialda medication and cannot get a refill due to the Application run out. Pt stated that PJ was working on it last month and wanted to check on the status of the application: Pt stated that he has this number which might help: (1 [phone]: [employer] Helping hand.) Please advise

## 2022-06-19 ENCOUNTER — Ambulatory Visit (INDEPENDENT_AMBULATORY_CARE_PROVIDER_SITE_OTHER): Payer: Medicare HMO | Admitting: Internal Medicine

## 2022-06-19 ENCOUNTER — Encounter: Payer: Self-pay | Admitting: Internal Medicine

## 2022-06-19 VITALS — BP 134/88 | HR 90 | Temp 98.0°F | Ht 71.0 in | Wt 251.0 lb

## 2022-06-19 DIAGNOSIS — J069 Acute upper respiratory infection, unspecified: Secondary | ICD-10-CM

## 2022-06-19 DIAGNOSIS — J328 Other chronic sinusitis: Secondary | ICD-10-CM | POA: Diagnosis not present

## 2022-06-19 MED ORDER — AMOXICILLIN-POT CLAVULANATE 875-125 MG PO TABS: 1.0000 | ORAL_TABLET | Freq: Two times a day (BID) | ORAL | 0 refills | Status: DC

## 2022-06-19 MED ORDER — SIMPLY SALINE 0.9 % NA AERS: 2.0000 | INHALATION_SPRAY | NASAL | 11 refills | Status: DC

## 2022-06-19 NOTE — Progress Notes (Signed)
[facility]: [phone]   Acute Care Medical Office Visit  Patient:  Angel Frazier      Age: 67 y.o.       Sex:  male  Date:   06/19/2022  PCP:    [name], [place] Provider: Loralee Pacas, MD  Assessment/Plan:     ICD-10-CM   1. Other chronic sinusitis  J32.8 amoxicillin-clavulanate (AUGMENTIN) 875-125 MG tablet    Saline (SIMPLY SALINE) 0.9 % AERS    2. Upper respiratory tract infection, unspecified type  J06.9        Sinus infection/Sinusitis Bacterial based on: Symptoms >10 days, Antibiotic indicated:  still might be viral or allergic too, or a combination, so also need to do sinus rinsing. He declined Flonase. Also suspect bronchitis but lungs clear to auscultation, no fever, no shortness of breath    Infection Control: We discussed the importance of daily sinus rinsing, hand hygiene, cough hygiene, masks, and quarantining to mitigate the spread of illness.  Follow up as needed, based on symptoms and resolution, go to ER if any difficulty catching breath.  Gave AVS handout.   Subjective:   Angel Frazier is a 67 y.o. male with past medical history significant for Patient Active Problem List   Diagnosis Date Noted   Former smoker 09/17/2019   Psoriasis caused by Humira 04/13/2019   Hyperlipidemia associated with type 2 diabetes mellitus ([place]) 01/20/2019   Essential hypertension 01/20/2019   Type 2 diabetes mellitus with hyperglycemia, without long-term current use of insulin ([place]) 12/22/2018   Pulmonary nodules 09/07/2015   Long-term use of immunosuppressant medication Stelara 07/12/2015   Long term current use of systemic steroids 11/18/2014   History of avascular necrosis  11/18/2014   Vitamin D deficiency 01/24/2013   ED (erectile dysfunction) 05/21/2012   Universal ulcerative colitis ([place]) 08/11/2008    Reviewed prior medications as listed: Outpatient Medications Prior to Visit  Medication Sig   Alcohol Swabs  (B-D SINGLE USE SWABS REGULAR) PADS USE AS DIRECTED OR NEEDED   amLODipine (NORVASC) 5 MG tablet Take 1 tablet (5 mg total) by mouth at bedtime.   Ascorbic Acid (VITAMIN C) 1000 MG tablet Take 1,000 mg by mouth daily. Reported on 09/28/2015   aspirin 81 MG tablet Take 81 mg by mouth daily. Reported on 09/28/2015   atorvastatin (LIPITOR) 40 MG tablet TAKE 1 TABLET (40 MG TOTAL) BY MOUTH ONCE A WEEK.   Blood Glucose Calibration (TRUE METRIX LEVEL 1) Low SOLN Use as directed   blood glucose meter kit and supplies Dispense based on patient and insurance preference. Use up to four times daily as directed. (FOR ICD-9 250.00, 250.01).   Blood Glucose Monitoring Suppl (TRUE METRIX METER) w/Device KIT USE AS DIRECTED   Calcium Carbonate-Vitamin D (CALCIUM-VITAMIN D3 PO) Take 1 tablet by mouth daily. Reported on 09/28/2015   Cyanocobalamin (B-12 PO) '5000mg'$  daily   glucose blood (TRUE METRIX BLOOD GLUCOSE TEST) test strip Use as instructed to check blood sugar before meals.   hydrochlorothiazide (HYDRODIURIL) 12.5 MG tablet TAKE 1 TABLET(12.5 MG) BY MOUTH DAILY   mesalamine (LIALDA) 1.2 g EC tablet Take 2 tablets (2.4 g total) by mouth 2 (two) times daily.   metFORMIN (GLUCOPHAGE) 1000 MG tablet TAKE 1 TABLET(1000 MG) BY MOUTH TWICE DAILY WITH A MEAL   Multiple Vitamins-Minerals (CENTRUM SILVER PO) Take 1 tablet by mouth daily. Reported on 09/28/2015   Omega-3 Fatty Acids (FISH OIL) 300 MG CAPS Take  300 mg by mouth daily.   TRUEplus Lancets 33G MISC USE TO CHECK SUGAR BEFORE MEALS 3 TIMES DAILY   ustekinumab (STELARA) 90 MG/ML SOSY injection Inject 1syringe under the skin every 8 weeks.   No facility-administered medications prior to visit.     He presented today for evaluation of: Chief Complaint  Patient presents with   Cough    Symptoms for almost two weeks. Took OTC medicine-was no longer working anymore. No fever.   Nasal Congestion     HPI  -other symptoms include: mostly feels like the cough  is in his chest -first symptoms of this illness appeared 12-14 days ago -Symptoms show no change over time -previous treatments: coricidin hbp only -sick contacts/travel/risks: denies flu/COVID exposure. Has not been tested for COVID. -Hx of: allergies, upper respiratory infections, diabetes....Marland Kitchen no history immunosuppression.  He takes coricidin hbp Chlorpheniramine Maleate (4 mg), Antihistamine Dextromethorphan; Hydrobromide (30 mg), Cough Suppressant-   ROS - denies fevers, illness-associated myalgias, chest pain (except sometimes during cough), shortness of breath, nausea, vomiting, diarrhea, tooth pain        Objective:  BP 134/88 (BP Location: Left Arm, Patient Position: Sitting)   Pulse 90   Temp 98 F (36.7 C) (Temporal)   Ht '5\' 11"'$  (1.803 m)   Wt 251 lb (113.9 kg)   SpO2 100%   BMI 35.01 kg/m  Physical Exam was problem focused only:  Obese male in no acute distress no acute distress,  awake alert and oriented, uncomfortable but not toxic-appearing  normal work of breathing, lungs were clear to auscultation bilaterally Heart was Normal heart rate. Normal rhythm. No murmurs, rubs, or gallops.  Problem-specific findings:  stuffy sounding, wearing mask  Oral mucosa was moist without significant tonsillar swelling or exudate Turbinates erythematous with yellow nasal drainage- very thick nasal hair obscures view. TM normal, pharynx mildly erythematous with no tonsilar exudate or edema,no sinus tenderness,    Results Reviewed: Results for orders placed or performed in visit on 06/11/22  POCT glycosylated hemoglobin (Hb A1C)  Result Value Ref Range   Hemoglobin A1C 6.4 (A) 4.0 - 5.6 %   HbA1c POC (<> result, manual entry)     HbA1c, POC (prediabetic range)     HbA1c, POC (controlled diabetic range)        Loralee Pacas, MD

## 2022-06-19 NOTE — Telephone Encounter (Signed)
I am reaching out to our patient assistance team to check on this.

## 2022-06-21 ENCOUNTER — Other Ambulatory Visit (HOSPITAL_COMMUNITY): Payer: Self-pay

## 2022-06-22 NOTE — Telephone Encounter (Signed)
Provider section has been faxed over for Stelara and [place]. Please fax back to [phone] once completed and signed. I called and left HIPAA compliant message to call me back at [phone] to provide updates to him.

## 2022-06-25 ENCOUNTER — Ambulatory Visit (INDEPENDENT_AMBULATORY_CARE_PROVIDER_SITE_OTHER): Payer: Medicare HMO

## 2022-06-25 VITALS — BP 138/98 | HR 79 | Temp 97.7°F | Wt 251.6 lb

## 2022-06-25 DIAGNOSIS — Z0000 Encounter for general adult medical examination without abnormal findings: Secondary | ICD-10-CM

## 2022-06-25 NOTE — Telephone Encounter (Signed)
Forms have been placed on Dr Celesta Aver desk to be signed when he returns tomorrow.

## 2022-06-25 NOTE — Progress Notes (Deleted)
I connected with  Georgie Chard on 06/25/22 by a audio enabled telemedicine application and verified that I am speaking with the correct person using two identifiers.  Patient Location: Home  Provider Location: Office/Clinic  I discussed the limitations of evaluation and management by telemedicine. The patient expressed understanding and agreed to proceed.  Subjective:   Angel Frazier is a 67 y.o. male who presents for Medicare Annual/Subsequent preventive examination.  Review of Systems     Cardiac Risk Factors include: advanced age (>2mn, >>32women);diabetes mellitus;dyslipidemia;hypertension;male gender;obesity (BMI >30kg/m2)     Objective:    Today's Vitals   06/25/22 0916  BP: (!) 138/100  Pulse: 79  Temp: 97.7 F (36.5 C)  SpO2: 94%  Weight: 251 lb 9.6 oz (114.1 kg)   Body mass index is 35.09 kg/m.     06/25/2022    9:26 AM 06/12/2021    9:38 AM 06/09/2020    9:41 AM 04/16/2019    4:45 PM 06/18/2017    9:58 AM 09/13/2015   12:24 PM 08/20/2015    2:00 AM  Advanced Directives  Does Patient Have a Medical Advance Directive? No No Yes No No No No  Does patient want to make changes to medical advance directive?   Yes (MAU/Ambulatory/Procedural Areas - Information given)      Would patient like information on creating a medical advance directive? Yes (MAU/Ambulatory/Procedural Areas - Information given) Yes (MAU/Ambulatory/Procedural Areas - Information given)  Yes (MAU/Ambulatory/Procedural Areas - Information given) Yes (MAU/Ambulatory/Procedural Areas - Information given) Yes - Educational materials given No - patient declined information    Current Medications (verified) Outpatient Encounter Medications as of 06/25/2022  Medication Sig   Alcohol Swabs (B-D SINGLE USE SWABS REGULAR) PADS USE AS DIRECTED OR NEEDED   amLODipine (NORVASC) 5 MG tablet Take 1 tablet (5 mg total) by mouth at bedtime.   amoxicillin-clavulanate (AUGMENTIN) 875-125 MG tablet Take 1 tablet by mouth 2  (two) times daily.   Ascorbic Acid (VITAMIN C) 1000 MG tablet Take 1,000 mg by mouth daily. Reported on 09/28/2015   aspirin 81 MG tablet Take 81 mg by mouth daily. Reported on 09/28/2015   atorvastatin (LIPITOR) 40 MG tablet TAKE 1 TABLET (40 MG TOTAL) BY MOUTH ONCE A WEEK.   Blood Glucose Calibration (TRUE METRIX LEVEL 1) Low SOLN Use as directed   blood glucose meter kit and supplies Dispense based on patient and insurance preference. Use up to four times daily as directed. (FOR ICD-9 250.00, 250.01).   Blood Glucose Monitoring Suppl (TRUE METRIX METER) w/Device KIT USE AS DIRECTED   Calcium Carbonate-Vitamin D (CALCIUM-VITAMIN D3 PO) Take 1 tablet by mouth daily. Reported on 09/28/2015   Cyanocobalamin (B-12 PO) '5000mg'$  daily   glucose blood (TRUE METRIX BLOOD GLUCOSE TEST) test strip Use as instructed to check blood sugar before meals.   hydrochlorothiazide (HYDRODIURIL) 12.5 MG tablet TAKE 1 TABLET(12.5 MG) BY MOUTH DAILY   mesalamine (LIALDA) 1.2 g EC tablet Take 2 tablets (2.4 g total) by mouth 2 (two) times daily.   metFORMIN (GLUCOPHAGE) 1000 MG tablet TAKE 1 TABLET(1000 MG) BY MOUTH TWICE DAILY WITH A MEAL   Multiple Vitamins-Minerals (CENTRUM SILVER PO) Take 1 tablet by mouth daily. Reported on 09/28/2015   Omega-3 Fatty Acids (FISH OIL) 300 MG CAPS Take 300 mg by mouth daily.   Saline (SIMPLY SALINE) 0.9 % AERS Place 2 each into the nose as directed. Use nightly for sinus hygiene long-term.  Can also be used as many  times daily as desired to assist with clearing congested sinuses.   TRUEplus Lancets 33G MISC USE TO CHECK SUGAR BEFORE MEALS 3 TIMES DAILY   ustekinumab (STELARA) 90 MG/ML SOSY injection Inject 1syringe under the skin every 8 weeks.   No facility-administered encounter medications on file as of 06/25/2022.    Allergies (verified) Humira [adalimumab] and Mercaptopurine   History: Past Medical History:  Diagnosis Date    vitamin D deficiency 01/24/2013   01/23/13 - level  29 - supplements 50k IU weekly x 6 recommended    Anabolic steroid abuse    PT. DENIES 03/30/19   Arthritis    Avascular necrosis of femoral head (HCC)    bilateral   Bleeding hemorrhoids    Diabetes mellitus without complication ([place]) 6433   type 2   Drug-induced acute pancreatitis - 6MP 09/07/2015   Erectile dysfunction    Hypertension    Left sided ulcerative colitis ([place])    Psoriasis caused by Humira 04/13/2019   Rhabdomyolysis 12/25/2018   Spinal stenosis of lumbar region at multiple levels    Past Surgical History:  Procedure Laterality Date   COLONOSCOPY     multiple   KNEE ARTHROSCOPY Right    PROSTATE BIOPSY  08/2020   Negative for cancer   SIGMOIDOSCOPY     Family History  Problem Relation Age of Onset   Diabetes Mother    Breast cancer Sister    Diabetes Other        cousin   Hypertension Other    Diabetes type I Son    Colon cancer Neg Hx    Esophageal cancer Neg Hx    Rectal cancer Neg Hx    Stomach cancer Neg Hx    Colon polyps Neg Hx    Social History   Socioeconomic History   Marital status: Single    Spouse name: Not on file   Number of children: 1   Years of education: Not on file   Highest education level: Not on file  Occupational History   Occupation: retired    Fish farm manager: [employer]  Tobacco Use   Smoking status: Former    Packs/day: 1.00    Years: 6.00    Total pack years: 6.00    Types: Cigarettes    Quit date: 06/04/2000    Years since quitting: 22.0   Smokeless tobacco: Never  Vaping Use   Vaping Use: Never used  Substance and Sexual Activity   Alcohol use: Not Currently    Comment: occasionally   Drug use: No   Sexual activity: Not Currently  Other Topics Concern   Not on file  Social History Narrative   Single never married. 1 son 56 in 2018. 1 grandson 50 in 2018. Lives in [place] alone      Disability- low back issues   Roll up machine operator      Hobbies: enjoys tinkering in his shed, enjoys  going to the gym   Social Determinants of Health   Financial Resource Strain: Low Risk  (06/25/2022)   Overall Financial Resource Strain (CARDIA)    Difficulty of Paying Living Expenses: Not hard at all  Food Insecurity: No Food Insecurity (06/25/2022)   Hunger Vital Sign    Worried About Running Out of Food in the Last Year: Never true    Ran Out of Food in the Last Year: Never true  Transportation Needs: No Transportation Needs (06/25/2022)   PRAPARE - Transportation  Lack of Transportation (Medical): No    Lack of Transportation (Non-Medical): No  Physical Activity: Inactive (06/25/2022)   Exercise Vital Sign    Days of Exercise per Week: 0 days    Minutes of Exercise per Session: 0 min  Stress: No Stress Concern Present (06/25/2022)   [facility]    Feeling of Stress : Not at all  Social Connections: Moderately Isolated (06/25/2022)   Social Connection and Isolation Panel [NHANES]    Frequency of Communication with Friends and Family: More than three times a week    Frequency of Social Gatherings with Friends and Family: More than three times a week    Attends Religious Services: More than 4 times per year    Active Member of [employer] or Organizations: No    Attends Music therapist: Never    Marital Status: Never married    Tobacco Counseling Counseling given: Not Answered   Clinical Intake:  Pre-visit preparation completed: Yes  Pain : No/denies pain     BMI - recorded: 35.09 Nutritional Status: BMI > 30  Obese Nutritional Risks: None Diabetes: Yes CBG done?: No Did pt. bring in CBG monitor from home?: No  How often do you need to have someone help you when you read instructions, pamphlets, or other written materials from your doctor or pharmacy?: 1 - Never  Diabetic?Nutrition Risk Assessment:  Has the patient had any N/V/D within the last 2 months?  No  Does the patient have any  non-healing wounds?  No  Has the patient had any unintentional weight loss or weight gain?  No   Diabetes:  Is the patient diabetic?  Yes  If diabetic, was a CBG obtained today?  No  Did the patient bring in their glucometer from home?  No  How often do you monitor your CBG's? Daily .   Financial Strains and Diabetes Management:  Are you having any financial strains with the device, your supplies or your medication? No .  Does the patient want to be seen by Chronic Care Management for management of their diabetes?  No  Would the patient like to be referred to a Nutritionist or for Diabetic Management?  No   Diabetic Exams:  Diabetic Eye Exam: Overdue for diabetic eye exam. Pt has been advised about the importance in completing this exam. Patient advised to call and schedule an eye exam. Diabetic Foot Exam: Completed 02/19/22  Interpreter Needed?: No  Information entered by :: Charlott Rakes, LPN   Activities of Daily Living    06/25/2022    9:28 AM  In your present state of health, do you have any difficulty performing the following activities:  Hearing? 0  Vision? 0  Difficulty concentrating or making decisions? 0  Walking or climbing stairs? 0  Dressing or bathing? 0  Doing errands, shopping? 0  Preparing Food and eating ? N  Using the Toilet? N  In the past six months, have you accidently leaked urine? N  Do you have problems with loss of bowel control? N  Managing your Medications? N  Managing your Finances? N  Housekeeping or managing your Housekeeping? N    Patient Care Team: [name], MD as PCP - General (Family Medicine) Renato Shin, MD (Inactive) as Consulting Physician (Endocrinology) Tanda Rockers, MD as Consulting Physician (Pulmonary Disease) Gatha Mayer, MD as Consulting Physician (Gastroenterology) Edythe Clarity, [facility] (Pharmacist)  Indicate any recent Medical Services you [phone]  have received from other than [facility] providers in the  past year (date may be approximate).     Assessment:   This is a routine wellness examination for Somtochukwu.  Hearing/Vision screen Hearing Screening - Comments:: Pt denies any hearing issues Vision Screening - Comments:: Pt follows up with [facility] for annual eye exam   Dietary issues and exercise activities discussed: Current Exercise Habits: The patient does not participate in regular exercise at present   Goals Addressed             This Visit's Progress    Patient Stated       Get better with this cold        Depression Screen    06/25/2022    9:25 AM 06/12/2021    9:36 AM 12/16/2020    9:21 AM 08/09/2020    8:01 AM 06/09/2020    9:39 AM 09/17/2019   10:50 AM 04/16/2019    4:46 PM  PHQ 2/9 Scores  PHQ - 2 Score 0 0 0 0 0 0 0  PHQ- 9 Score      0     Fall Risk    06/25/2022    9:28 AM 06/12/2021    9:39 AM 12/16/2020    9:21 AM 08/09/2020    8:00 AM 06/09/2020    9:43 AM  Fall Risk   Falls in the past year? 0 0 0 0 0  Number falls in past yr: 0 0 0 0 0  Injury with Fall? 0 0 0 0 0  Risk for fall due to : Impaired vision Impaired vision No Fall Risks  Impaired vision  Follow up Falls prevention discussed Falls prevention discussed Falls evaluation completed  Falls prevention discussed    FALL RISK PREVENTION PERTAINING TO THE HOME:  Any stairs in or around the home? Yes  If so, are there any without handrails? No  Home free of loose throw rugs in walkways, pet beds, electrical cords, etc? Yes  Adequate lighting in your home to reduce risk of falls? Yes   ASSISTIVE DEVICES UTILIZED TO PREVENT FALLS:  Life alert? No  Use of a cane, walker or w/c? No  Grab bars in the bathroom? No  Shower chair or bench in shower? No  Elevated toilet seat or a handicapped toilet? No   TIMED UP AND GO:  Was the test performed? Yes .  Length of time to ambulate 10 feet: 10 sec.   Gait steady and fast without use of assistive device  Cognitive Function:        06/25/2022     9:28 AM 06/12/2021    9:50 AM 06/09/2020    9:45 AM 04/16/2019    4:45 PM  6CIT Screen  What Year? 0 points 0 points 0 points 0 points  What month? 0 points 0 points 0 points 0 points  What time? 0 points 0 points  0 points  Count back from 20 0 points 0 points 0 points 0 points  Months in reverse 2 points 0 points 0 points 0 points  Repeat phrase 0 points 4 points 4 points 0 points  Total Score 2 points 4 points  0 points    Immunizations Immunization History  Administered Date(s) Administered   Fluad Quad(high Dose 65+) 03/21/2020, 03/04/2021   Influenza Split 02/15/2011   Influenza, Seasonal, Injecte, Preservative Fre 05/21/2012   Influenza,inj,Quad PF,6+ Mos 06/03/2013   Moderna Covid-19 Vaccine Bivalent Booster 2yr & up 04/19/2021   Moderna  SARS-COV2 Booster Vaccination 11/16/2020   Moderna Sars-Covid-2 Vaccination 08/24/2019, 09/21/2019, 04/04/2020   Td 06/04/2001   Tdap 05/21/2012, 03/09/2021   Zoster Recombinat (Shingrix) 04/25/2021, 06/28/2021    TDAP status: Up to date  Flu Vaccine status: Due, Education has been provided regarding the importance of this vaccine. Advised may receive this vaccine at local pharmacy or Health Dept. Aware to provide a copy of the vaccination record if obtained from local pharmacy or Health Dept. Verbalized acceptance and understanding.  Pneumococcal vaccine status: Due, Education has been provided regarding the importance of this vaccine. Advised may receive this vaccine at local pharmacy or Health Dept. Aware to provide a copy of the vaccination record if obtained from local pharmacy or Health Dept. Verbalized acceptance and understanding.  Covid-19 vaccine status: Completed vaccines  Qualifies for Shingles Vaccine? Yes   Zostavax completed Yes   Shingrix Completed?: Yes  Screening Tests Health Maintenance  Topic Date Due   OPHTHALMOLOGY EXAM  06/22/2021   COVID-19 Vaccine (6 - 2023-24 season) 02/02/2022   COLONOSCOPY (Pts  45-48yr Insurance coverage will need to be confirmed)  03/02/2022   INFLUENZA VACCINE  09/02/2022 (Originally 01/02/2022)   Pneumonia Vaccine 69 Years old (1 - PCV) 02/13/2023 (Originally 03/30/2019)   HEMOGLOBIN A1C  12/10/2022   Diabetic kidney evaluation - eGFR measurement  02/13/2023   Diabetic kidney evaluation - Urine ACR  02/13/2023   FOOT EXAM  02/20/2023   Medicare Annual Wellness (AWV)  06/26/2023   DTaP/Tdap/Td (4 - Td or Tdap) 03/10/2031   Hepatitis C Screening  Completed   Zoster Vaccines- Shingrix  Completed   HPV VACCINES  Aged Out    Health Maintenance  Health Maintenance Due  Topic Date Due   OPHTHALMOLOGY EXAM  06/22/2021   COVID-19 Vaccine (6 - 2023-24 season) 02/02/2022   COLONOSCOPY (Pts 45-42yrInsurance coverage will need to be confirmed)  03/02/2022    Colorectal cancer screening: Type of screening: Colonoscopy. Completed 03/02/21. Repeat every 1 years pt stated appt 07/2022   Additional Screening:  Hepatitis C Screening:  Completed 09/07/16  Vision Screening: Recommended annual ophthalmology exams for early detection of glaucoma and other disorders of the eye. Is the patient up to date with their annual eye exam?  No  Who is the provider or what is the name of the office in which the patient attends annual eye exams? [facility]  If pt is not established with a provider, would they like to be referred to a provider to establish care? No .   Dental Screening: Recommended annual dental exams for proper oral hygiene  Community Resource Referral / Chronic Care Management: CRR required this visit?  No   CCM required this visit?  No      Plan:     I have personally reviewed and noted the following in the patient's chart:   Medical and social history Use of alcohol, tobacco or illicit drugs  Current medications and supplements including opioid prescriptions. Patient is not currently taking opioid prescriptions. Functional ability and  status Nutritional status Physical activity Advanced directives List of other physicians Hospitalizations, surgeries, and ER visits in previous 12 months Vitals Screenings to include cognitive, depression, and falls Referrals and appointments  In addition, I have reviewed and discussed with patient certain preventive protocols, quality metrics, and best practice recommendations. A written personalized care plan for preventive services as well as general preventive health recommendations were provided to patient.     TiWillette BraceLPN   06/08/23/9563  Nurse Notes: Pt blood pressure 138 /100 recheck 138/98 pt given blood pressure chart to monitor, pt has appt in march please advise if need sooner appt

## 2022-06-25 NOTE — Patient Instructions (Signed)
[name] , Thank you for taking time to come for your Medicare Wellness Visit. I appreciate your ongoing commitment to your health goals. Please review the following plan we discussed and let me know if I can assist you in the future.   These are the goals we discussed:  Goals      Maintain current health status     Monitor and Manage My Blood Sugar-Diabetes Type 2     Timeframe:  Long-Range Goal Priority:  High Start Date:   05/08/21                          Expected End Date:  11/06/21                     Follow Up Date 08/06/21    - check blood sugar at prescribed times - check blood sugar before and after exercise - check blood sugar if I feel it is too high or too low - enter blood sugar readings and medication or insulin into daily log    Why is this important?   Checking your blood sugar at home helps to keep it from getting very high or very low.  Writing the results in a diary or log helps the doctor know how to care for you.  Your blood sugar log should have the time, date and the results.  Also, write down the amount of insulin or other medicine that you take.  Other information, like what you ate, exercise done and how you were feeling, will also be helpful.     Notes:      Patient Stated     Lose weight      Patient Stated     Lose weight      [place] (see longitudinal plan of care for additional care plan information)  Current Barriers:  Chronic Disease Management support, education, and care coordination needs related to Hypertension, Hyperlipidemia, and Diabetes   Hypertension BP Readings from Last 3 Encounters:  03/21/20 132/74  02/15/20 (!) 142/82  01/22/20 124/82  Pharmacist Clinical Goal(s): Over the next 365 days, patient will work with PharmD and providers to maintain BP goal <130/80 Current regimen:  Amlodipine 5 mg once daily  Interventions: Reviewed home monitoring recommendations Reviewed diet and exercise -  Maintain a healthy weight and exercise regularly, as directed by your health care provider. Eat healthy foods, such as: Lean proteins, complex carbohydrates, fresh fruits and vegetables, low-fat dairy products, healthy fats. Patient self care activities - Over the next 365 days, patient will: Check BP at least once every 1-2 weeks, document, and provide at future appointments Ensure daily salt intake < 2300 mg/day  Hyperlipidemia Lab Results  Component Value Date/Time   LDLCALC 91 09/17/2019 11:28 AM   LDLDIRECT 64.0 12/08/2019 01:24 PM  Pharmacist Clinical Goal(s): Over the next 365 days, patient will work with PharmD and providers to maintain LDL goal < 70 Current regimen:  Atorvastatin 40 mg once WEEKLY Interventions: Reviewed side effects/tolerability - none noted at present. Patient self care activities - Over the next 365 days, patient will: Continue current management  Diabetes Lab Results  Component Value Date/Time   HGBA1C 6.7 (A) 02/15/2020 08:09 AM   HGBA1C 6.1 (A) 10/12/2019 09:32 AM   HGBA1C 6.7 (H) 12/04/2018 10:09 AM   HGBA1C 6.3 09/11/2017 08:26 AM  Pharmacist Clinical Goal(s): Over the next 365  days, patient will work with PharmD and providers to maintain A1c goal <7% Current regimen:  Metformin 1000 mg twice daily with meals Prandin 0.5 mg tablet - take half (0.25 mg) tablet by mouth once daily with supper Interventions: Reviewed plate method for DM diet. Reviewed PAP for [employer] - appears would qualify, agreed to pursue application. Patient self care activities - Over the next 365 days, patient will: Check blood sugar once daily, document, and provide at future appointments Contact provider with any episodes of hypoglycemia  Medication management Pharmacist Clinical Goal(s): Over the next 365 days, patient will work with PharmD and providers to maintain optimal medication adherence Current pharmacy: [facility] and [place] Interventions Comprehensive medication review performed. Continue current medication management strategy. Patient self care activities - Over the next 365 days, patient will: Take medications as prescribed Report any questions or concerns to PharmD and/or provider(s)  Initial goal documentation.        This is a list of the screening recommended for you and due dates:  Health Maintenance  Topic Date Due   Eye exam for diabetics  06/22/2021   COVID-19 Vaccine (6 - 2023-24 season) 02/02/2022   Colon Cancer Screening  03/02/2022   Flu Shot  09/02/2022*   Pneumonia Vaccine (1 - PCV) 02/13/2023*   Hemoglobin A1C  12/10/2022   Yearly kidney function blood test for diabetes  02/13/2023   Yearly kidney health urinalysis for diabetes  02/13/2023   Complete foot exam   02/20/2023   Medicare Annual Wellness Visit  06/26/2023   DTaP/Tdap/Td vaccine (4 - Td or Tdap) 03/10/2031   Hepatitis C Screening: USPSTF Recommendation to screen - Ages 58-79 yo.  Completed   Zoster (Shingles) Vaccine  Completed   HPV Vaccine  Aged Out  *Topic was postponed. The date shown is not the original due date.    Advanced directives: Advance directive discussed with you today. I have provided a copy for you to complete at home and have notarized. Once this is complete please bring a copy in to our office so we can scan it into your chart.  Conditions/risks identified: stay healthy   Next appointment: Follow up in one year for your annual wellness visit.   Preventive Care 81 Years and Older, Male  Preventive care refers to lifestyle choices and visits with your health care provider that can promote health and wellness. What does preventive care include? A yearly physical exam. This is also called an annual well check. Dental exams once or twice a year. Routine eye exams. Ask your health care provider how often you should have your eyes checked. Personal lifestyle choices, including: Daily care of  your teeth and gums. Regular physical activity. Eating a healthy diet. Avoiding tobacco and drug use. Limiting alcohol use. Practicing safe sex. Taking low doses of aspirin every day. Taking vitamin and mineral supplements as recommended by your health care provider. What happens during an annual well check? The services and screenings done by your health care provider during your annual well check will depend on your age, overall health, lifestyle risk factors, and family history of disease. Counseling  Your health care provider may ask you questions about your: Alcohol use. Tobacco use. Drug use. Emotional well-being. Home and relationship well-being. Sexual activity. Eating habits. History of falls. Memory and ability to understand (cognition). Work and work Statistician. Screening  You may have the following tests or measurements: Height, weight, and BMI. Blood pressure. Lipid and cholesterol levels. These  may be checked every 5 years, or more frequently if you are over 9 years old. Skin check. Lung cancer screening. You may have this screening every year starting at age 59 if you have a 30-pack-year history of smoking and currently smoke or have quit within the past 15 years. Fecal occult blood test (FOBT) of the stool. You may have this test every year starting at age 52. Flexible sigmoidoscopy or colonoscopy. You may have a sigmoidoscopy every 5 years or a colonoscopy every 10 years starting at age 58. Prostate cancer screening. Recommendations will vary depending on your family history and other risks. Hepatitis C blood test. Hepatitis B blood test. Sexually transmitted disease (STD) testing. Diabetes screening. This is done by checking your blood sugar (glucose) after you have not eaten for a while (fasting). You may have this done every 1-3 years. Abdominal aortic aneurysm (AAA) screening. You may need this if you are a current or former smoker. Osteoporosis. You may be  screened starting at age 14 if you are at high risk. Talk with your health care provider about your test results, treatment options, and if necessary, the need for more tests. Vaccines  Your health care provider may recommend certain vaccines, such as: Influenza vaccine. This is recommended every year. Tetanus, diphtheria, and acellular pertussis (Tdap, Td) vaccine. You may need a Td booster every 10 years. Zoster vaccine. You may need this after age 21. Pneumococcal 13-valent conjugate (PCV13) vaccine. One dose is recommended after age 13. Pneumococcal polysaccharide (PPSV23) vaccine. One dose is recommended after age 32. Talk to your health care provider about which screenings and vaccines you need and how often you need them. This information is not intended to replace advice given to you by your health care provider. Make sure you discuss any questions you have with your health care provider. Document Released: 06/17/2015 Document Revised: 02/08/2016 Document Reviewed: 03/22/2015 Elsevier Interactive Patient Education  2017 [place] Prevention in the Home Falls can cause injuries. They can happen to people of all ages. There are many things you can do to make your home safe and to help prevent falls. What can I do on the outside of my home? Regularly fix the edges of walkways and driveways and fix any cracks. Remove anything that might make you trip as you walk through a door, such as a raised step or threshold. Trim any bushes or trees on the path to your home. Use bright outdoor lighting. Clear any walking paths of anything that might make someone trip, such as rocks or tools. Regularly check to see if handrails are loose or broken. Make sure that both sides of any steps have handrails. Any raised decks and porches should have guardrails on the edges. Have any leaves, snow, or ice cleared regularly. Use sand or salt on walking paths during winter. Clean up any spills in  your garage right away. This includes oil or grease spills. What can I do in the bathroom? Use night lights. Install grab bars by the toilet and in the tub and shower. Do not use towel bars as grab bars. Use non-skid mats or decals in the tub or shower. If you need to sit down in the shower, use a plastic, non-slip stool. Keep the floor dry. Clean up any water that spills on the floor as soon as it happens. Remove soap buildup in the tub or shower regularly. Attach bath mats securely with double-sided non-slip rug tape. Do not have throw rugs  and other things on the floor that can make you trip. What can I do in the bedroom? Use night lights. Make sure that you have a light by your bed that is easy to reach. Do not use any sheets or blankets that are too big for your bed. They should not hang down onto the floor. Have a firm chair that has side arms. You can use this for support while you get dressed. Do not have throw rugs and other things on the floor that can make you trip. What can I do in the kitchen? Clean up any spills right away. Avoid walking on wet floors. Keep items that you use a lot in easy-to-reach places. If you need to reach something above you, use a strong step stool that has a grab bar. Keep electrical cords out of the way. Do not use floor polish or wax that makes floors slippery. If you must use wax, use non-skid floor wax. Do not have throw rugs and other things on the floor that can make you trip. What can I do with my stairs? Do not leave any items on the stairs. Make sure that there are handrails on both sides of the stairs and use them. Fix handrails that are broken or loose. Make sure that handrails are as long as the stairways. Check any carpeting to make sure that it is firmly attached to the stairs. Fix any carpet that is loose or worn. Avoid having throw rugs at the top or bottom of the stairs. If you do have throw rugs, attach them to the floor with carpet  tape. Make sure that you have a light switch at the top of the stairs and the bottom of the stairs. If you do not have them, ask someone to add them for you. What else can I do to help prevent falls? Wear shoes that: Do not have high heels. Have rubber bottoms. Are comfortable and fit you well. Are closed at the toe. Do not wear sandals. If you use a stepladder: Make sure that it is fully opened. Do not climb a closed stepladder. Make sure that both sides of the stepladder are locked into place. Ask someone to hold it for you, if possible. Clearly mark and make sure that you can see: Any grab bars or handrails. First and last steps. Where the edge of each step is. Use tools that help you move around (mobility aids) if they are needed. These include: Canes. Walkers. Scooters. Crutches. Turn on the lights when you go into a dark area. Replace any light bulbs as soon as they burn out. Set up your furniture so you have a clear path. Avoid moving your furniture around. If any of your floors are uneven, fix them. If there are any pets around you, be aware of where they are. Review your medicines with your doctor. Some medicines can make you feel dizzy. This can increase your chance of falling. Ask your doctor what other things that you can do to help prevent falls. This information is not intended to replace advice given to you by your health care provider. Make sure you discuss any questions you have with your health care provider. Document Released: 03/17/2009 Document Revised: 10/27/2015 Document Reviewed: 06/25/2014 Elsevier Interactive Patient Education  2017 [employer].

## 2022-06-26 ENCOUNTER — Telehealth: Payer: Self-pay

## 2022-06-26 NOTE — Telephone Encounter (Signed)
Pt needs to be schedule fore an appt with Dr Yong Channel to follow up with blood pressure reading in a few weeks, LVM for pt

## 2022-06-27 ENCOUNTER — Other Ambulatory Visit (HOSPITAL_COMMUNITY): Payer: Self-pay

## 2022-06-27 NOTE — Progress Notes (Signed)
Subjective:   Angel Frazier is a 67 y.o. male who presents for Medicare Annual/Subsequent preventive examination.  Review of Systems     Cardiac Risk Factors include: advanced age (>42mn, >>23women);diabetes mellitus;dyslipidemia;hypertension;male gender;obesity (BMI >30kg/m2)     Objective:    Today's Vitals   06/25/22 0916 06/25/22 0936  BP: (!) 138/100 (!) 138/98  Pulse: 79   Temp: 97.7 F (36.5 C)   SpO2: 94%   Weight: 251 lb 9.6 oz (114.1 kg)    Body mass index is 35.09 kg/m.     06/25/2022    9:26 AM 06/12/2021    9:38 AM 06/09/2020    9:41 AM 04/16/2019    4:45 PM 06/18/2017    9:58 AM 09/13/2015   12:24 PM 08/20/2015    2:00 AM  Advanced Directives  Does Patient Have a Medical Advance Directive? No No Yes No No No No  Does patient want to make changes to medical advance directive?   Yes (MAU/Ambulatory/Procedural Areas - Information given)      Would patient like information on creating a medical advance directive? Yes (MAU/Ambulatory/Procedural Areas - Information given) Yes (MAU/Ambulatory/Procedural Areas - Information given)  Yes (MAU/Ambulatory/Procedural Areas - Information given) Yes (MAU/Ambulatory/Procedural Areas - Information given) Yes - Educational materials given No - patient declined information    Current Medications (verified) Outpatient Encounter Medications as of 06/25/2022  Medication Sig   Alcohol Swabs (B-D SINGLE USE SWABS REGULAR) PADS USE AS DIRECTED OR NEEDED   amLODipine (NORVASC) 5 MG tablet Take 1 tablet (5 mg total) by mouth at bedtime.   amoxicillin-clavulanate (AUGMENTIN) 875-125 MG tablet Take 1 tablet by mouth 2 (two) times daily.   Ascorbic Acid (VITAMIN C) 1000 MG tablet Take 1,000 mg by mouth daily. Reported on 09/28/2015   aspirin 81 MG tablet Take 81 mg by mouth daily. Reported on 09/28/2015   atorvastatin (LIPITOR) 40 MG tablet TAKE 1 TABLET (40 MG TOTAL) BY MOUTH ONCE A WEEK.   Blood Glucose Calibration (TRUE METRIX LEVEL 1) Low  SOLN Use as directed   blood glucose meter kit and supplies Dispense based on patient and insurance preference. Use up to four times daily as directed. (FOR ICD-9 250.00, 250.01).   Blood Glucose Monitoring Suppl (TRUE METRIX METER) w/Device KIT USE AS DIRECTED   Calcium Carbonate-Vitamin D (CALCIUM-VITAMIN D3 PO) Take 1 tablet by mouth daily. Reported on 09/28/2015   Cyanocobalamin (B-12 PO) '5000mg'$  daily   glucose blood (TRUE METRIX BLOOD GLUCOSE TEST) test strip Use as instructed to check blood sugar before meals.   hydrochlorothiazide (HYDRODIURIL) 12.5 MG tablet TAKE 1 TABLET(12.5 MG) BY MOUTH DAILY   mesalamine (LIALDA) 1.2 g EC tablet Take 2 tablets (2.4 g total) by mouth 2 (two) times daily.   metFORMIN (GLUCOPHAGE) 1000 MG tablet TAKE 1 TABLET(1000 MG) BY MOUTH TWICE DAILY WITH A MEAL   Multiple Vitamins-Minerals (CENTRUM SILVER PO) Take 1 tablet by mouth daily. Reported on 09/28/2015   Omega-3 Fatty Acids (FISH OIL) 300 MG CAPS Take 300 mg by mouth daily.   Saline (SIMPLY SALINE) 0.9 % AERS Place 2 each into the nose as directed. Use nightly for sinus hygiene long-term.  Can also be used as many times daily as desired to assist with clearing congested sinuses.   TRUEplus Lancets 33G MISC USE TO CHECK SUGAR BEFORE MEALS 3 TIMES DAILY   ustekinumab (STELARA) 90 MG/ML SOSY injection Inject 1syringe under the skin every 8 weeks.   No facility-administered encounter medications  on file as of 06/25/2022.    Allergies (verified) Humira [adalimumab] and Mercaptopurine   History: Past Medical History:  Diagnosis Date    vitamin D deficiency 01/24/2013   01/23/13 - level 29 - supplements 50k IU weekly x 6 recommended    Anabolic steroid abuse    PT. DENIES 03/30/19   Arthritis    Avascular necrosis of femoral head (HCC)    bilateral   Bleeding hemorrhoids    Diabetes mellitus without complication ([place]) 6659   type 2   Drug-induced acute pancreatitis - 6MP 09/07/2015   Erectile dysfunction     Hypertension    Left sided ulcerative colitis ([place])    Psoriasis caused by Humira 04/13/2019   Rhabdomyolysis 12/25/2018   Spinal stenosis of lumbar region at multiple levels    Past Surgical History:  Procedure Laterality Date   COLONOSCOPY     multiple   KNEE ARTHROSCOPY Right    PROSTATE BIOPSY  08/2020   Negative for cancer   SIGMOIDOSCOPY     Family History  Problem Relation Age of Onset   Diabetes Mother    Breast cancer Sister    Diabetes Other        cousin   Hypertension Other    Diabetes type I Son    Colon cancer Neg Hx    Esophageal cancer Neg Hx    Rectal cancer Neg Hx    Stomach cancer Neg Hx    Colon polyps Neg Hx    Social History   Socioeconomic History   Marital status: Single    Spouse name: Not on file   Number of children: 1   Years of education: Not on file   Highest education level: Not on file  Occupational History   Occupation: retired    Fish farm manager: [employer]  Tobacco Use   Smoking status: Former    Packs/day: 1.00    Years: 6.00    Total pack years: 6.00    Types: Cigarettes    Quit date: 06/04/2000    Years since quitting: 22.0   Smokeless tobacco: Never  Vaping Use   Vaping Use: Never used  Substance and Sexual Activity   Alcohol use: Not Currently    Comment: occasionally   Drug use: No   Sexual activity: Not Currently  Other Topics Concern   Not on file  Social History Narrative   Single never married. 1 son 55 in 2018. 1 grandson 56 in 2018. Lives in [place] alone      Disability- low back issues   Roll up machine operator      Hobbies: enjoys tinkering in his shed, enjoys going to the gym   Social Determinants of Health   Financial Resource Strain: Low Risk  (06/25/2022)   Overall Financial Resource Strain (CARDIA)    Difficulty of Paying Living Expenses: Not hard at all  Food Insecurity: No Food Insecurity (06/25/2022)   Hunger Vital Sign    Worried About Running Out of Food in the Last Year:  Never true    Ran Out of Food in the Last Year: Never true  Transportation Needs: No Transportation Needs (06/25/2022)   PRAPARE - Hydrologist (Medical): No    Lack of Transportation (Non-Medical): No  Physical Activity: Inactive (06/25/2022)   Exercise Vital Sign    Days of Exercise per Week: 0 days    Minutes of Exercise per Session: 0 min  Stress: No  Stress Concern Present (06/25/2022)   [place]    Feeling of Stress : Not at all  Social Connections: Moderately Isolated (06/25/2022)   Social Connection and Isolation Panel [NHANES]    Frequency of Communication with Friends and Family: More than three times a week    Frequency of Social Gatherings with Friends and Family: More than three times a week    Attends Religious Services: More than 4 times per year    Active Member of [employer] or Organizations: No    Attends Music therapist: Never    Marital Status: Never married    Tobacco Counseling Counseling given: Not Answered   Clinical Intake:  Pre-visit preparation completed: Yes  Pain : No/denies pain     BMI - recorded: 35.09 Nutritional Status: BMI > 30  Obese Nutritional Risks: None Diabetes: Yes CBG done?: No Did pt. bring in CBG monitor from home?: No  How often do you need to have someone help you when you read instructions, pamphlets, or other written materials from your doctor or pharmacy?: 1 - Never  Diabetic?Nutrition Risk Assessment:  Has the patient had any N/V/D within the last 2 months?  No  Does the patient have any non-healing wounds?  No  Has the patient had any unintentional weight loss or weight gain?  No   Diabetes:  Is the patient diabetic?  Yes  If diabetic, was a CBG obtained today?  No  Did the patient bring in their glucometer from home?  No  How often do you monitor your CBG's? Daily .   Financial Strains and Diabetes  Management:  Are you having any financial strains with the device, your supplies or your medication? No .  Does the patient want to be seen by Chronic Care Management for management of their diabetes?  No  Would the patient like to be referred to a Nutritionist or for Diabetic Management?  No   Diabetic Exams:  Diabetic Eye Exam: Overdue for diabetic eye exam. Pt has been advised about the importance in completing this exam. Patient advised to call and schedule an eye exam. Diabetic Foot Exam: Completed 02/19/22  Interpreter Needed?: No  Information entered by :: Charlott Rakes, LPN   Activities of Daily Living    06/25/2022    9:28 AM  In your present state of health, do you have any difficulty performing the following activities:  Hearing? 0  Vision? 0  Difficulty concentrating or making decisions? 0  Walking or climbing stairs? 0  Dressing or bathing? 0  Doing errands, shopping? 0  Preparing Food and eating ? N  Using the Toilet? N  In the past six months, have you accidently leaked urine? N  Do you have problems with loss of bowel control? N  Managing your Medications? N  Managing your Finances? N  Housekeeping or managing your Housekeeping? N    Patient Care Team: [name], MD as PCP - General (Family Medicine) Renato Shin, MD (Inactive) as Consulting Physician (Endocrinology) Tanda Rockers, MD as Consulting Physician (Pulmonary Disease) Gatha Mayer, MD as Consulting Physician (Gastroenterology) Edythe Clarity, [facility] (Pharmacist)  Indicate any recent Medical Services you may have received from other than [facility] providers in the past year (date may be approximate).     Assessment:   This is a routine wellness examination for Juliano.  Hearing/Vision screen Hearing Screening - Comments:: Pt denies any hearing issues Vision Screening - Comments::  Pt follows up with [facility] for annual eye exam   Dietary issues and exercise activities  discussed: Current Exercise Habits: The patient does not participate in regular exercise at present   Goals Addressed             This Visit's Progress    Patient Stated       Get better with this cold       Depression Screen    06/25/2022    9:25 AM 06/12/2021    9:36 AM 12/16/2020    9:21 AM 08/09/2020    8:01 AM 06/09/2020    9:39 AM 09/17/2019   10:50 AM 04/16/2019    4:46 PM  PHQ 2/9 Scores  PHQ - 2 Score 0 0 0 0 0 0 0  PHQ- 9 Score      0     Fall Risk    06/25/2022    9:28 AM 06/12/2021    9:39 AM 12/16/2020    9:21 AM 08/09/2020    8:00 AM 06/09/2020    9:43 AM  Fall Risk   Falls in the past year? 0 0 0 0 0  Number falls in past yr: 0 0 0 0 0  Injury with Fall? 0 0 0 0 0  Risk for fall due to : Impaired vision Impaired vision No Fall Risks  Impaired vision  Follow up Falls prevention discussed Falls prevention discussed Falls evaluation completed  Falls prevention discussed    FALL RISK PREVENTION PERTAINING TO THE HOME:  Any stairs in or around the home? Yes  If so, are there any without handrails? No  Home free of loose throw rugs in walkways, pet beds, electrical cords, etc? Yes  Adequate lighting in your home to reduce risk of falls? Yes   ASSISTIVE DEVICES UTILIZED TO PREVENT FALLS:  Life alert? No  Use of a cane, walker or w/c? No  Grab bars in the bathroom? No  Shower chair or bench in shower? No  Elevated toilet seat or a handicapped toilet? No   TIMED UP AND GO:  Was the test performed? Yes .  Length of time to ambulate 10 feet: 10 sec.   Gait steady and fast without use of assistive device  Cognitive Function:        06/25/2022    9:28 AM 06/12/2021    9:50 AM 06/09/2020    9:45 AM 04/16/2019    4:45 PM  6CIT Screen  What Year? 0 points 0 points 0 points 0 points  What month? 0 points 0 points 0 points 0 points  What time? 0 points 0 points  0 points  Count back from 20 0 points 0 points 0 points 0 points  Months in reverse 2 points 0  points 0 points 0 points  Repeat phrase 0 points 4 points 4 points 0 points  Total Score 2 points 4 points  0 points    Immunizations Immunization History  Administered Date(s) Administered   Fluad Quad(high Dose 65+) 03/21/2020, 03/04/2021   Influenza Split 02/15/2011   Influenza, Seasonal, Injecte, Preservative Fre 05/21/2012   Influenza,inj,Quad PF,6+ Mos 06/03/2013   Moderna Covid-19 Vaccine Bivalent Booster 55yr & up 04/19/2021   Moderna SARS-COV2 Booster Vaccination 11/16/2020   Moderna Sars-Covid-2 Vaccination 08/24/2019, 09/21/2019, 04/04/2020   Td 06/04/2001   Tdap 05/21/2012, 03/09/2021   Zoster Recombinat (Shingrix) 04/25/2021, 06/28/2021    TDAP status: Up to date  Flu Vaccine status: Due, Education has been provided regarding the  importance of this vaccine. Advised may receive this vaccine at local pharmacy or Health Dept. Aware to provide a copy of the vaccination record if obtained from local pharmacy or Health Dept. Verbalized acceptance and understanding.  Pneumococcal vaccine status: Due, Education has been provided regarding the importance of this vaccine. Advised may receive this vaccine at local pharmacy or Health Dept. Aware to provide a copy of the vaccination record if obtained from local pharmacy or Health Dept. Verbalized acceptance and understanding.  Covid-19 vaccine status: Completed vaccines  Qualifies for Shingles Vaccine? Yes   Zostavax completed Yes   Shingrix Completed?: Yes  Screening Tests Health Maintenance  Topic Date Due   OPHTHALMOLOGY EXAM  06/22/2021   COVID-19 Vaccine (6 - 2023-24 season) 02/02/2022   COLONOSCOPY (Pts 45-53yr Insurance coverage will need to be confirmed)  03/02/2022   INFLUENZA VACCINE  09/02/2022 (Originally 01/02/2022)   Pneumonia Vaccine 69 Years old (1 - PCV) 02/13/2023 (Originally 03/30/2019)   HEMOGLOBIN A1C  12/10/2022   Diabetic kidney evaluation - eGFR measurement  02/13/2023   Diabetic kidney evaluation -  Urine ACR  02/13/2023   FOOT EXAM  02/20/2023   Medicare Annual Wellness (AWV)  06/26/2023   DTaP/Tdap/Td (4 - Td or Tdap) 03/10/2031   Hepatitis C Screening  Completed   Zoster Vaccines- Shingrix  Completed   HPV VACCINES  Aged Out    Health Maintenance  Health Maintenance Due  Topic Date Due   OPHTHALMOLOGY EXAM  06/22/2021   COVID-19 Vaccine (6 - 2023-24 season) 02/02/2022   COLONOSCOPY (Pts 45-427yrInsurance coverage will need to be confirmed)  03/02/2022    Colorectal cancer screening: Type of screening: Colonoscopy. Completed 03/02/21. Repeat every 1 years pt stated appt 07/2022   Additional Screening:  Hepatitis C Screening:  Completed 09/07/16  Vision Screening: Recommended annual ophthalmology exams for early detection of glaucoma and other disorders of the eye. Is the patient up to date with their annual eye exam?  No  Who is the provider or what is the name of the office in which the patient attends annual eye exams? [facility]  If pt is not established with a provider, would they like to be referred to a provider to establish care? No .   Dental Screening: Recommended annual dental exams for proper oral hygiene  Community Resource Referral / Chronic Care Management: CRR required this visit?  No   CCM required this visit?  No      Plan:     I have personally reviewed and noted the following in the patient's chart:   Medical and social history Use of alcohol, tobacco or illicit drugs  Current medications and supplements including opioid prescriptions. Patient is not currently taking opioid prescriptions. Functional ability and status Nutritional status Physical activity Advanced directives List of other physicians Hospitalizations, surgeries, and ER visits in previous 12 months Vitals Screenings to include cognitive, depression, and falls Referrals and appointments  In addition, I have reviewed and discussed with patient certain preventive protocols,  quality metrics, and best practice recommendations. A written personalized care plan for preventive services as well as general preventive health recommendations were provided to patient.     TiWillette BraceLPN   06/10/38/8144 Nurse Notes: Pt blood pressure 138 /100 recheck 138/98 pt given blood pressure chart to monitor, pt has appt in march please advise if need sooner appt

## 2022-06-28 NOTE — Telephone Encounter (Signed)
Forms were faxed back to [facility], [facility] yesterday.

## 2022-06-29 ENCOUNTER — Encounter: Payer: Self-pay | Admitting: Internal Medicine

## 2022-07-02 ENCOUNTER — Encounter: Payer: Self-pay | Admitting: Certified Registered Nurse Anesthetist

## 2022-07-02 NOTE — Telephone Encounter (Signed)
Inbound call from patient stating that helping [facility] was going to send over a new application for his medication.

## 2022-07-05 ENCOUNTER — Encounter: Payer: Self-pay | Admitting: Family Medicine

## 2022-07-05 ENCOUNTER — Ambulatory Visit (INDEPENDENT_AMBULATORY_CARE_PROVIDER_SITE_OTHER): Payer: Medicare HMO | Admitting: Family Medicine

## 2022-07-05 VITALS — BP 146/94 | HR 95 | Temp 97.5°F | Ht 71.0 in | Wt 255.6 lb

## 2022-07-05 DIAGNOSIS — I10 Essential (primary) hypertension: Secondary | ICD-10-CM

## 2022-07-05 MED ORDER — HYDROCHLOROTHIAZIDE 25 MG PO TABS: 25.0000 mg | ORAL_TABLET | Freq: Every day | ORAL | 3 refills | Status: DC

## 2022-07-05 NOTE — Patient Instructions (Addendum)
Get diabetic eye exam scheduled.  blood pressure has increased with weight gain -he wants to work on gettting weight back down -while he is working on this we will try hydrochlorothiazide 25 mg instead of 12.5 mg -continue amlodipine 5 mg -if home readings get down into 110s/60s or 70s range could consider going back to 12.5 mg dose with weight loss  Recommended follow up: Return for next already scheduled visit or sooner if needed.

## 2022-07-05 NOTE — Progress Notes (Signed)
Phone [phone] In person visit   Subjective:   Angel Frazier is a 66 y.o. year old very pleasant male patient who presents for/with See problem oriented charting Chief Complaint  Patient presents with   Hypertension    Pt states his bp has been fluctuating.   Past Medical History-  Patient Active Problem List   Diagnosis Date Noted   Type 2 diabetes mellitus with hyperglycemia, without long-term current use of insulin ([place]) 12/22/2018    Priority: High   Long-term use of immunosuppressant medication Stelara 07/12/2015    Priority: High   Universal ulcerative colitis ([place]) 08/11/2008    Priority: High   Hyperlipidemia associated with type 2 diabetes mellitus ([place]) 01/20/2019    Priority: Medium    Essential hypertension 01/20/2019    Priority: Medium    Pulmonary nodules 09/07/2015    Priority: Medium    Former smoker 09/17/2019    Priority: Low   Long term current use of systemic steroids 11/18/2014    Priority: Low   History of avascular necrosis  11/18/2014    Priority: Low   Vitamin D deficiency 01/24/2013    Priority: Low   ED (erectile dysfunction) 05/21/2012    Priority: Low   Psoriasis caused by Humira 04/13/2019    Medications- reviewed and updated Current Outpatient Medications  Medication Sig Dispense Refill   Alcohol Swabs (B-D SINGLE USE SWABS REGULAR) PADS USE AS DIRECTED OR NEEDED 300 each 12   amLODipine (NORVASC) 5 MG tablet Take 1 tablet (5 mg total) by mouth at bedtime. 10 tablet 0   Ascorbic Acid (VITAMIN C) 1000 MG tablet Take 1,000 mg by mouth daily. Reported on 09/28/2015     aspirin 81 MG tablet Take 81 mg by mouth daily. Reported on 09/28/2015     atorvastatin (LIPITOR) 40 MG tablet TAKE 1 TABLET (40 MG TOTAL) BY MOUTH ONCE A WEEK. 13 tablet 3   Blood Glucose Calibration (TRUE METRIX LEVEL 1) Low SOLN Use as directed 1 each 1   blood glucose meter kit and supplies Dispense based on patient and insurance preference. Use up to four times daily  as directed. (FOR ICD-9 250.00, 250.01). 1 each 0   Blood Glucose Monitoring Suppl (TRUE METRIX METER) w/Device KIT USE AS DIRECTED 1 kit 1   Calcium Carbonate-Vitamin D (CALCIUM-VITAMIN D3 PO) Take 1 tablet by mouth daily. Reported on 09/28/2015     Cyanocobalamin (B-12 PO) '5000mg'$  daily     glucose blood (TRUE METRIX BLOOD GLUCOSE TEST) test strip Use as instructed to check blood sugar before meals. 300 each 12   hydrochlorothiazide (HYDRODIURIL) 25 MG tablet Take 1 tablet (25 mg total) by mouth daily. 90 tablet 3   mesalamine (LIALDA) 1.2 g EC tablet Take 2 tablets (2.4 g total) by mouth 2 (two) times daily. 360 tablet 3   metFORMIN (GLUCOPHAGE) 1000 MG tablet TAKE 1 TABLET(1000 MG) BY MOUTH TWICE DAILY WITH A MEAL 180 tablet 3   Multiple Vitamins-Minerals (CENTRUM SILVER PO) Take 1 tablet by mouth daily. Reported on 09/28/2015     Omega-3 Fatty Acids (FISH OIL) 300 MG CAPS Take 300 mg by mouth daily.     Saline (SIMPLY SALINE) 0.9 % AERS Place 2 each into the nose as directed. Use nightly for sinus hygiene long-term.  Can also be used as many times daily as desired to assist with clearing congested sinuses. 127 mL 11   TRUEplus Lancets 33G MISC USE TO CHECK SUGAR BEFORE MEALS 3 TIMES DAILY  300 each 0   ustekinumab (STELARA) 90 MG/ML SOSY injection Inject 1syringe under the skin every 8 weeks. 1 mL 6   No current facility-administered medications for this visit.     Objective:  BP (!) 146/94   Pulse 95   Temp (!) 97.5 F (36.4 C)   Ht '5\' 11"'$  (1.803 m)   Wt 255 lb 9.6 oz (115.9 kg)   SpO2 98%   BMI 35.65 kg/m  Gen: NAD, resting comfortably CV: RRR no murmurs rubs or gallops Lungs: CTAB no crackles, wheeze, rhonchi Ext: trace edema Skin: warm, dry     Assessment and Plan   #hypertension S: medication: amlodipine 5 mg, hctz 12.5 mg -Blood pressure was elevated at annual wellness visit we recommended follow-up Home readings #s: average in 130s/90s. Had been on augmentin but that  was out of his system by time of most of readings he gave [place] -has had cold recently and has not been exercising and on vacation before that. Still trying to minimize salt intake and minimizes fried foods. Has gained some weight since not going to the gym.  -since our last visit in September up 10 lbs on home scales, 13 lbs here.  -no chest pain or shortness of breath  BP Readings from Last 3 Encounters:  07/05/22 (!) 146/94  06/25/22 (!) 138/98  06/19/22 134/88  A/P: blood pressure has increased with weight gain -he wants to work on gettting weight back down -while he is working on this we will try hydrochlorothiazide 25 mg instead of 12.5 mg -continue amlodipine 5 mg -if home readings get down into 110s/60s or 70s range could consider going back to 12.5 mg dose with weight loss  Recommended follow up: [facility]pe in march- Return for next already scheduled visit or sooner if needed. Future Appointments  Date Time Provider [place]  07/09/2022 10:30 AM Gatha Mayer, MD [facility]  08/27/2022  9:00 AM [name], MD [facility]  12/17/2022  8:40 AM Philemon Kingdom, MD [facility] None  07/02/2023  9:15 AM [facility]    Lab/Order associations:   ICD-10-CM   1. Essential hypertension  I10       Meds ordered this encounter  Medications   hydrochlorothiazide (HYDRODIURIL) 25 MG tablet    Sig: Take 1 tablet (25 mg total) by mouth daily.    Dispense:  90 tablet    Refill:  3    Return precautions advised.  [name], MD

## 2022-07-09 ENCOUNTER — Encounter: Payer: Self-pay | Admitting: Internal Medicine

## 2022-07-09 ENCOUNTER — Ambulatory Visit (AMBULATORY_SURGERY_CENTER): Payer: Medicare HMO | Admitting: Internal Medicine

## 2022-07-09 VITALS — BP 122/87 | HR 89 | Temp 97.5°F | Resp 15 | Ht 71.0 in | Wt 252.0 lb

## 2022-07-09 DIAGNOSIS — K529 Noninfective gastroenteritis and colitis, unspecified: Secondary | ICD-10-CM | POA: Diagnosis not present

## 2022-07-09 DIAGNOSIS — E119 Type 2 diabetes mellitus without complications: Secondary | ICD-10-CM | POA: Diagnosis not present

## 2022-07-09 DIAGNOSIS — K51018 Ulcerative (chronic) pancolitis with other complication: Secondary | ICD-10-CM

## 2022-07-09 DIAGNOSIS — K630 Abscess of intestine: Secondary | ICD-10-CM | POA: Diagnosis not present

## 2022-07-09 DIAGNOSIS — I10 Essential (primary) hypertension: Secondary | ICD-10-CM | POA: Diagnosis not present

## 2022-07-09 MED ORDER — SODIUM CHLORIDE 0.9 % IV SOLN: 500.0000 mL | Freq: Once | INTRAVENOUS | Status: DC

## 2022-07-09 NOTE — Op Note (Signed)
[place] Patient Name: Angel Frazier Procedure Date: 07/09/2022 10:29 AM MRN: [id number] Endoscopist: Gatha Mayer , MD, [id number] Age: 67 Referring MD:  Date of Birth: [birth year] Gender: Male Account #: [id number] Procedure:                Colonoscopy Indications:              Chronic ulcerative pancolitis, Follow-up of chronic                            ulcerative pancolitis, Disease activity assessment                            of chronic ulcerative pancolitis, Assess                            therapeutic response to therapy of chronic                            ulcerative pancolitis Medicines:                Monitored Anesthesia Care Procedure:                Pre-Anesthesia Assessment:                           - Prior to the procedure, a History and Physical                            was performed, and patient medications and                            allergies were reviewed. The patient's tolerance of                            previous anesthesia was also reviewed. The risks                            and benefits of the procedure and the sedation                            options and risks were discussed with the patient.                            All questions were answered, and informed consent                            was obtained. Prior Anticoagulants: The patient has                            taken no anticoagulant or antiplatelet agents. ASA                            Grade Assessment: II - A patient with mild systemic  disease. After reviewing the risks and benefits,                            the patient was deemed in satisfactory condition to                            undergo the procedure.                           After obtaining informed consent, the colonoscope                            was passed under direct vision. Throughout the                            procedure, the patient's blood pressure, pulse, and                             oxygen saturations were monitored continuously. The                            Olympus SN [id number] was introduced through the anus                            and advanced to the the terminal ileum, with                            identification of the appendiceal orifice and IC                            valve. The colonoscopy was performed without                            difficulty. The patient tolerated the procedure                            well. The quality of the bowel preparation was                            excellent. The terminal ileum, ileocecal valve,                            appendiceal orifice, and rectum were photographed.                            The bowel preparation used was Miralax via split                            dose instruction. Scope In: 10:43:58 AM Scope Out: 11:04:03 AM Scope Withdrawal Time: 0 hours 15 minutes 44 seconds  Total Procedure Duration: 0 hours 20 minutes 5 seconds  Findings:                 The perianal and digital rectal examinations were  normal.                           The terminal ileum appeared normal.                           An area of mildly altered vascular, erythematous,                            granular, inflamed and ulcerated mucosa was found                            in the entire colon. Two biopsies were taken every                            10 cm with a cold forceps from the entire colon for                            ulcerative colitis surveillance. These biopsy                            specimens from the cecum/ascending; transverse                            colon; descending/proximal sigmoid; distal sigmoid/                            were sent to Pathology. Verification of patient                            identification for the specimen was done. Estimated                            blood loss was minimal.                           The exam was otherwise without  abnormality on                            direct and retroflexion views. Complications:            No immediate complications. Estimated Blood Loss:     Estimated blood loss was minimal. Impression:               - The examined portion of the ileum was normal.                           - Altered vascular, erythematous, granular,                            inflamed and ulcerated mucosa in the entire                            examined colon. Biopsied. left colon with fairly  dissuse and confluent changes though some                            patchiness; transverse, ascending and cecum with                            poatchy focal aphtouos ulceration - surveillance                            biopsies taken as described above                           - The examination was otherwise normal on direct                            and retroflexion views. Recommendation:           - Patient has a contact number available for                            emergencies. The signs and symptoms of potential                            delayed complications were discussed with the                            patient. Return to normal activities tomorrow.                            Written discharge instructions were provided to the                            patient.                           - Resume previous diet.                           - Continue present medications. Need to consider a                            change in therapy - he is on mesalamine 4.8 g daily                            + stelara every 8 weeks                           - Await pathology results.                           - Repeat colonoscopy is recommended for                            surveillance. The colonoscopy date will be  determined after pathology results from today's                            exam become available for review. Gatha Mayer, MD 07/09/2022 11:13:29  AM This report has been signed electronically.

## 2022-07-09 NOTE — Progress Notes (Signed)
VS by JD  Pt's states no medical or surgical changes since previsit or office visit.  

## 2022-07-09 NOTE — Progress Notes (Signed)
Report given to PACU, vss 

## 2022-07-09 NOTE — Patient Instructions (Addendum)
The colitis is not under control. Mild activity in multiple areas.  I took biopsies and we will sort it out.  I appreciate the opportunity to care for you.  Gatha Mayer, MD, [facility]  Recommendation:  - Patient has a contact number available for                            emergencies. The signs and symptoms of potential                            delayed complications were discussed with the                            patient. Return to normal activities tomorrow.                            Written discharge instructions were provided to the                            patient.                           - Resume previous diet.                           - Continue present medications. Need to consider a                            change in therapy - he is on mesalamine 4.8 g daily                            + stelara every 8 weeks                           - Await pathology results.                           - Repeat colonoscopy is recommended for                            surveillance. The colonoscopy date will be                            determined after pathology results from today's                            exam become available for review.   YOU HAD AN ENDOSCOPIC PROCEDURE TODAY AT [facility]:   Refer to the procedure report that was given to you for any specific questions about what was found during the examination.  If the procedure report does not answer your questions, please call your gastroenterologist to clarify.  If you requested that your care partner not be given the details of your procedure findings, then the procedure report has been included in a sealed envelope for you to review at your convenience later.  YOU SHOULD EXPECT: Some feelings of bloating in  the abdomen. Passage of more gas than usual.  Walking can help get rid of the air that was put into your GI tract during the procedure and reduce the bloating. If you had a lower endoscopy (such as a  colonoscopy or flexible sigmoidoscopy) you may notice spotting of blood in your stool or on the toilet paper. If you underwent a bowel prep for your procedure, you may not have a normal bowel movement for a few days.  Please Note:  You might notice some irritation and congestion in your nose or some drainage.  This is from the oxygen used during your procedure.  There is no need for concern and it should clear up in a day or so.  SYMPTOMS TO REPORT IMMEDIATELY:  Following lower endoscopy (colonoscopy or flexible sigmoidoscopy):  Excessive amounts of blood in the stool  Significant tenderness or worsening of abdominal pains  Swelling of the abdomen that is new, acute  Fever of 100F or higher  For urgent or emergent issues, a gastroenterologist can be reached at any hour by calling [phone]. Do not use MyChart messaging for urgent concerns.   DIET:  We do recommend a small meal at first, but then you may proceed to your regular diet.  Drink plenty of fluids but you should avoid alcoholic beverages for 24 hours.  ACTIVITY:  You should plan to take it easy for the rest of today and you should NOT DRIVE or use heavy machinery until tomorrow (because of the sedation medicines used during the test).    FOLLOW UP: Our staff will call the number listed on your records the next business day following your procedure.  We will call around 7:15- 8:00 am to check on you and address any questions or concerns that you may have regarding the information given to you following your procedure. If we do not reach you, we will leave a message.     If any biopsies were taken you will be contacted by phone or by letter within the next 1-3 weeks.  Please call us at [phone] if you have not heard about the biopsies in 3 weeks.   SIGNATURES/CONFIDENTIALITY: You and/or your care partner have signed paperwork which will be entered into your electronic medical record.  These signatures attest to the fact  that that the information above on your After Visit Summary has been reviewed and is understood.  Full responsibility of the confidentiality of this discharge information lies with you and/or your care-partner.

## 2022-07-09 NOTE — Progress Notes (Signed)
[facility] History and Physical   Primary Care Physician:  [name], MD   Reason for Procedure:   Ulcerative colitis surveillance  Plan:    colonoscopy     HPI: Angel Frazier is a 67 y.o. male here to reassessulcerative colitis while on Stelara   Past Medical History:  Diagnosis Date    vitamin D deficiency 01/24/2013   01/23/13 - level 29 - supplements 50k IU weekly x 6 recommended    Anabolic steroid abuse    PT. DENIES 03/30/19   Arthritis    Avascular necrosis of femoral head ([place])    bilateral   Bleeding hemorrhoids    Diabetes mellitus without complication ([place]) 6160   type 2   Drug-induced acute pancreatitis - 6MP 09/07/2015   Erectile dysfunction    Hypertension    Left sided ulcerative colitis ([place])    Psoriasis caused by Humira 04/13/2019   Rhabdomyolysis 12/25/2018   Spinal stenosis of lumbar region at multiple levels     Past Surgical History:  Procedure Laterality Date   COLONOSCOPY     multiple   KNEE ARTHROSCOPY Right    PROSTATE BIOPSY  08/2020   Negative for cancer   SIGMOIDOSCOPY      Prior to Admission medications   Medication Sig Start Date End Date Taking? Authorizing Provider  Alcohol Swabs (B-D SINGLE USE SWABS REGULAR) PADS USE AS DIRECTED OR NEEDED 05/05/20  Yes Renato Shin, MD  amLODipine (NORVASC) 5 MG tablet Take 1 tablet (5 mg total) by mouth at bedtime. 09/01/21  Yes [name], MD  Ascorbic Acid (VITAMIN C) 1000 MG tablet Take 1,000 mg by mouth daily. Reported on 09/28/2015   Yes [provider]  aspirin 81 MG tablet Take 81 mg by mouth daily. Reported on 09/28/2015   Yes [provider]  atorvastatin (LIPITOR) 40 MG tablet TAKE 1 TABLET (40 MG TOTAL) BY MOUTH ONCE A WEEK. 05/31/21  Yes [name], MD  Blood Glucose Calibration (TRUE METRIX LEVEL 1) Low SOLN Use as directed 03/21/20  Yes Renato Shin, MD  blood glucose meter kit and supplies Dispense based on patient and insurance  preference. Use up to four times daily as directed. (FOR ICD-9 250.00, 250.01). 04/09/17  Yes [name], MD  Blood Glucose Monitoring Suppl (TRUE METRIX METER) w/Device KIT USE AS DIRECTED 11/25/20  Yes Renato Shin, MD  Calcium Carbonate-Vitamin D (CALCIUM-VITAMIN D3 PO) Take 1 tablet by mouth daily. Reported on 09/28/2015   Yes [provider]  Cyanocobalamin (B-12 PO) '5000mg'$  daily   Yes [provider]  glucose blood (TRUE METRIX BLOOD GLUCOSE TEST) test strip Use as instructed to check blood sugar before meals. 03/14/22  Yes Philemon Kingdom, MD  hydrochlorothiazide (HYDRODIURIL) 25 MG tablet Take 1 tablet (25 mg total) by mouth daily. 07/05/22  Yes [name], MD  mesalamine (LIALDA) 1.2 g EC tablet Take 2 tablets (2.4 g total) by mouth 2 (two) times daily. 02/20/21  Yes Gatha Mayer, MD  metFORMIN (GLUCOPHAGE) 1000 MG tablet TAKE 1 TABLET(1000 MG) BY MOUTH TWICE DAILY WITH A MEAL 03/15/22  Yes Philemon Kingdom, MD  Multiple Vitamins-Minerals (CENTRUM SILVER PO) Take 1 tablet by mouth daily. Reported on 09/28/2015   Yes [provider]  Omega-3 Fatty Acids (FISH OIL) 300 MG CAPS Take 300 mg by mouth daily.   Yes [provider]  TRUEplus Lancets 33G MISC USE TO CHECK SUGAR BEFORE MEALS 3 TIMES DAILY 03/14/22  Yes Philemon Kingdom, MD  Saline (SIMPLY SALINE) 0.9 % AERS Place 2 each into the nose as directed. Use nightly for sinus hygiene long-term.  Can also be used as many times daily as desired to assist with clearing congested sinuses. 06/19/22   Loralee Pacas, MD  ustekinumab Delsa Grana) 90 MG/ML SOSY injection Inject 1syringe under the skin every 8 weeks. 05/29/22   Gatha Mayer, MD    Current Outpatient Medications  Medication Sig Dispense Refill   Alcohol Swabs (B-D SINGLE USE SWABS REGULAR) PADS USE AS DIRECTED OR NEEDED 300 each 12   amLODipine (NORVASC) 5 MG tablet Take 1 tablet (5 mg total) by mouth at bedtime. 10 tablet 0    Ascorbic Acid (VITAMIN C) 1000 MG tablet Take 1,000 mg by mouth daily. Reported on 09/28/2015     aspirin 81 MG tablet Take 81 mg by mouth daily. Reported on 09/28/2015     atorvastatin (LIPITOR) 40 MG tablet TAKE 1 TABLET (40 MG TOTAL) BY MOUTH ONCE A WEEK. 13 tablet 3   Blood Glucose Calibration (TRUE METRIX LEVEL 1) Low SOLN Use as directed 1 each 1   blood glucose meter kit and supplies Dispense based on patient and insurance preference. Use up to four times daily as directed. (FOR ICD-9 250.00, 250.01). 1 each 0   Blood Glucose Monitoring Suppl (TRUE METRIX METER) w/Device KIT USE AS DIRECTED 1 kit 1   Calcium Carbonate-Vitamin D (CALCIUM-VITAMIN D3 PO) Take 1 tablet by mouth daily. Reported on 09/28/2015     Cyanocobalamin (B-12 PO) '5000mg'$  daily     glucose blood (TRUE METRIX BLOOD GLUCOSE TEST) test strip Use as instructed to check blood sugar before meals. 300 each 12   hydrochlorothiazide (HYDRODIURIL) 25 MG tablet Take 1 tablet (25 mg total) by mouth daily. 90 tablet 3   mesalamine (LIALDA) 1.2 g EC tablet Take 2 tablets (2.4 g total) by mouth 2 (two) times daily. 360 tablet 3   metFORMIN (GLUCOPHAGE) 1000 MG tablet TAKE 1 TABLET(1000 MG) BY MOUTH TWICE DAILY WITH A MEAL 180 tablet 3   Multiple Vitamins-Minerals (CENTRUM SILVER PO) Take 1 tablet by mouth daily. Reported on 09/28/2015     Omega-3 Fatty Acids (FISH OIL) 300 MG CAPS Take 300 mg by mouth daily.     TRUEplus Lancets 33G MISC USE TO CHECK SUGAR BEFORE MEALS 3 TIMES DAILY 300 each 0   Saline (SIMPLY SALINE) 0.9 % AERS Place 2 each into the nose as directed. Use nightly for sinus hygiene long-term.  Can also be used as many times daily as desired to assist with clearing congested sinuses. 127 mL 11   ustekinumab (STELARA) 90 MG/ML SOSY injection Inject 1syringe under the skin every 8 weeks. 1 mL 6   Current Facility-Administered Medications  Medication Dose Route Frequency Provider Last Rate Last Admin   0.9 %  sodium chloride  infusion  500 mL Intravenous Once Gatha Mayer, MD        Allergies as of 07/09/2022 - Review Complete 07/09/2022  Allergen Reaction Noted   Humira [adalimumab] Rash 04/13/2019   Mercaptopurine Other (See Comments) 09/07/2015    Family History  Problem Relation Age of Onset   Diabetes Mother    Breast cancer Sister    Diabetes Other        cousin   Hypertension Other    Diabetes type I Son    Colon cancer Neg Hx    Esophageal cancer Neg Hx    Rectal  cancer Neg Hx    Stomach cancer Neg Hx    Colon polyps Neg Hx     Social History   Socioeconomic History   Marital status: Single    Spouse name: Not on file   Number of children: 1   Years of education: Not on file   Highest education level: Not on file  Occupational History   Occupation: retired    Fish farm manager: [employer]  Tobacco Use   Smoking status: Former    Packs/day: 1.00    Years: 6.00    Total pack years: 6.00    Types: Cigarettes    Quit date: 06/04/2000    Years since quitting: 22.1   Smokeless tobacco: Never  Vaping Use   Vaping Use: Never used  Substance and Sexual Activity   Alcohol use: Not Currently    Comment: occasionally   Drug use: No   Sexual activity: Not Currently  Other Topics Concern   Not on file  Social History Narrative   Single never married. 1 son 64 in 2018. 1 grandson 72 in 2018. Lives in [place] alone      Disability- low back issues   Roll up machine operator      Hobbies: enjoys tinkering in his shed, enjoys going to the gym   Social Determinants of Health   Financial Resource Strain: Low Risk  (06/25/2022)   Overall Financial Resource Strain (CARDIA)    Difficulty of Paying Living Expenses: Not hard at all  Food Insecurity: No Food Insecurity (06/25/2022)   Hunger Vital Sign    Worried About Running Out of Food in the Last Year: Never true    Ran Out of Food in the Last Year: Never true  Transportation Needs: No Transportation Needs (06/25/2022)    PRAPARE - Hydrologist (Medical): No    Lack of Transportation (Non-Medical): No  Physical Activity: Inactive (06/25/2022)   Exercise Vital Sign    Days of Exercise per Week: 0 days    Minutes of Exercise per Session: 0 min  Stress: No Stress Concern Present (06/25/2022)   [place]    Feeling of Stress : Not at all  Social Connections: Moderately Isolated (06/25/2022)   Social Connection and Isolation Panel [NHANES]    Frequency of Communication with Friends and Family: More than three times a week    Frequency of Social Gatherings with Friends and Family: More than three times a week    Attends Religious Services: More than 4 times per year    Active Member of [employer] or Organizations: No    Attends Archivist Meetings: Never    Marital Status: Never married  Intimate Partner Violence: Not At Risk (06/25/2022)   Humiliation, Afraid, Rape, and Kick questionnaire    Fear of Current or Ex-Partner: No    Emotionally Abused: No    Physically Abused: No    Sexually Abused: No    Review of Systems:  All other review of systems negative except as mentioned in the HPI.  Physical Exam: Vital signs BP (!) 150/99   Pulse 71   Temp (!) 97.5 F (36.4 C)   Resp 15   Ht '5\' 11"'$  (1.803 m)   Wt 252 lb (114.3 kg)   SpO2 97%   BMI 35.15 kg/m   General:   Alert,  Well-developed, well-nourished, pleasant and cooperative in NAD Lungs:  Clear throughout  to auscultation.   Heart:  Regular rate and rhythm; no murmurs, clicks, rubs,  or gallops. Abdomen:  Soft, nontender and nondistended. Normal bowel sounds.   Neuro/Psych:  Alert and cooperative. Normal mood and affect. A and O x 3   '@Angel Frazier'$  Simonne Maffucci, MD, [facility] [phone] (pager) 07/09/2022 10:29 AM@

## 2022-07-09 NOTE — Progress Notes (Signed)
Called to room to assist during endoscopic procedure.  Patient ID and intended procedure confirmed with present staff. Received instructions for my participation in the procedure from the performing physician.  

## 2022-07-09 NOTE — Progress Notes (Signed)
1055 Ephedrine 10 mg given IV due to low BP, MD updated.

## 2022-07-10 NOTE — Telephone Encounter (Signed)
Attemped followup call; no answer; left message.

## 2022-07-16 ENCOUNTER — Other Ambulatory Visit: Payer: Self-pay | Admitting: Internal Medicine

## 2022-07-16 DIAGNOSIS — Z7960 Long term (current) use of unspecified immunomodulators and immunosuppressants: Secondary | ICD-10-CM

## 2022-07-16 DIAGNOSIS — K51018 Ulcerative (chronic) pancolitis with other complication: Secondary | ICD-10-CM

## 2022-07-18 NOTE — Telephone Encounter (Signed)
I called and left Kase a message, I was just touching base to see if he called Belenda Cruise back.

## 2022-07-19 DIAGNOSIS — Z0100 Encounter for examination of eyes and vision without abnormal findings: Secondary | ICD-10-CM | POA: Diagnosis not present

## 2022-07-19 DIAGNOSIS — E119 Type 2 diabetes mellitus without complications: Secondary | ICD-10-CM | POA: Diagnosis not present

## 2022-07-19 LAB — HM DIABETES EYE EXAM

## 2022-07-19 NOTE — Telephone Encounter (Signed)
I spoke with Angel Frazier and he didn't have a # to call you back. He said please call him back at [phone]. He has gotten his Lialda but hasn't heard about the Stelara. Thanks for your help.

## 2022-07-24 NOTE — Telephone Encounter (Signed)
Received a fax from   [place]  regarding an approval for patient assistance from 07/03/22 to 06/04/23.   Phone number: [phone]

## 2022-07-25 DIAGNOSIS — M25511 Pain in right shoulder: Secondary | ICD-10-CM | POA: Diagnosis not present

## 2022-07-30 ENCOUNTER — Telehealth: Payer: Self-pay | Admitting: Internal Medicine

## 2022-07-30 MED ORDER — PREDNISONE 10 MG PO TABS: ORAL_TABLET | ORAL | 0 refills | Status: AC

## 2022-07-30 NOTE — Telephone Encounter (Signed)
Pt made aware of Prescription sent in by Dr. Carlean Purl:  Pt reminded to come for drug levels on about 3/11 to see if he has antibodies to Stelara and what level is: Pt scheduled for an office visit on 08/22/2022 at 8:50 AM: Pt made aware. Pt verbalized understanding with all questions answered.

## 2022-07-30 NOTE — Telephone Encounter (Signed)
Inbound call from patient stating he is having irregulare bol movements. Please advise.  Thank you

## 2022-07-30 NOTE — Telephone Encounter (Signed)
Pt stated that he has had loose BM's since his procedure that is increasing in frequency as the weeks go by. Pt stated that after the Colonoscopy his BM was 2 to 3 a day that increased as time went on to 3 to 4  a day and now he is at 5 to 6 loose BM a day.  Please advise.

## 2022-07-30 NOTE — Telephone Encounter (Signed)
I sent in a prednisone taper to treat colitis flare  He is to come for drug levels on about 3/11 to see if he has antibodies to Stelara and what level is - please remind him  I would like him to come see me 3/20 in [address] spot that is open

## 2022-08-06 ENCOUNTER — Encounter: Payer: Self-pay | Admitting: Pharmacist

## 2022-08-08 ENCOUNTER — Other Ambulatory Visit: Payer: Self-pay | Admitting: Family Medicine

## 2022-08-13 ENCOUNTER — Other Ambulatory Visit: Payer: Medicare HMO

## 2022-08-13 DIAGNOSIS — K51018 Ulcerative (chronic) pancolitis with other complication: Secondary | ICD-10-CM | POA: Diagnosis not present

## 2022-08-13 DIAGNOSIS — Z7960 Long term (current) use of unspecified immunomodulators and immunosuppressants: Secondary | ICD-10-CM | POA: Diagnosis not present

## 2022-08-22 ENCOUNTER — Ambulatory Visit: Payer: Medicare HMO | Admitting: Internal Medicine

## 2022-08-22 ENCOUNTER — Encounter: Payer: Self-pay | Admitting: Internal Medicine

## 2022-08-22 VITALS — BP 140/90 | HR 90 | Ht 71.0 in | Wt 251.0 lb

## 2022-08-22 DIAGNOSIS — K51019 Ulcerative (chronic) pancolitis with unspecified complications: Secondary | ICD-10-CM | POA: Diagnosis not present

## 2022-08-22 DIAGNOSIS — R030 Elevated blood-pressure reading, without diagnosis of hypertension: Secondary | ICD-10-CM

## 2022-08-22 DIAGNOSIS — Z7960 Long term (current) use of unspecified immunomodulators and immunosuppressants: Secondary | ICD-10-CM | POA: Diagnosis not present

## 2022-08-22 NOTE — Progress Notes (Signed)
Angel Frazier 67 y.o. June 03, 1956 [id number]  Assessment & Plan:   Encounter Diagnoses  Name Primary?   Universal ulcerative colitis with complication (HCC) Yes   Long-term use of immunosuppressant medication    Elevated blood pressure reading    His flare is improving though he has not resolved clinically.  He is ustekinumab levels are low antibody levels pending.  We rechecked his blood pressure and it was improved.  Prednisone may be influencing this.  He will keep follow-up with primary care on that and once I see his antibody levels we will make a decision about how to treat this.  If he has significant antibody levels we will need to change therapy.  If not we will see if he can be approved for monthly ustekinumab.    Subjective:  Gastroenterology summary 03/14/04 colonoscopy left UC, asacol initiated No active disease on sigmoidoscopy in 2012 Maintained on mesalamine 4.8 g daily, Asacol HD and then Lialda 12/2012 - flare Tx prednisone 2015-16 - prednisone required 2017-18 prednisone 3-4 x hypersensitivity with mercaptopurine Humira initiated spring 2020 Humira stopped late 2020 due to psoriasis Late 2022, Stelara begun after colonoscopy demonstrated active left and some microscopic right colitis Patient gets assistance with mesalamine and Stelara Colonoscopy 07/09/2022-normal TI, patchy plus diffuse mild colitis seen more pronounced in left colon and biopsies in the right colon (cecum ascending and transverse were normal left colon showed active colitis Patient called with flare late February started on prednisone taper  Chief Complaint: Follow-up ulcerative colitis  HPI Angel Frazier is a 67 year old African-American man with longstanding ulcerative colitis as above, plus drug-induced pancreatitis from 6-MP, bleeding hemorrhoids, hypertension and diabetes mellitus who presents for follow-up.  Recent colonoscopy outlined above, a couple of weeks after that he called with a flare and  he is now on a prednisone taper at about 10 mg a day at this point.  Stools are better they went from watery to mostly formed now really a pasty consistency 4-5 a day so he is better but not resolved.  He went to orthopedics and was prescribed meloxicam a little bit after this flare started as well he took that for a couple of weeks, he had right shoulder pain that has resolved.  Ustekinumab trough levels 0.5.  Antibodies pending from 07/09/2022  Allergies  Allergen Reactions   Humira [Adalimumab] Rash    psoriasis   Mercaptopurine Other (See Comments)    Caused Pancreatitis   Current Meds  Medication Sig   Alcohol Swabs (B-D SINGLE USE SWABS REGULAR) PADS USE AS DIRECTED OR NEEDED   amLODipine (NORVASC) 5 MG tablet Take 1 tablet (5 mg total) by mouth at bedtime.   Ascorbic Acid (VITAMIN C) 1000 MG tablet Take 1,000 mg by mouth daily. Reported on 09/28/2015   aspirin 81 MG tablet Take 81 mg by mouth daily. Reported on 09/28/2015   atorvastatin (LIPITOR) 40 MG tablet TAKE 1 TABLET ONCE A WEEK.   Blood Glucose Calibration (TRUE METRIX LEVEL 1) Low SOLN Use as directed   blood glucose meter kit and supplies Dispense based on patient and insurance preference. Use up to four times daily as directed. (FOR ICD-9 250.00, 250.01).   Blood Glucose Monitoring Suppl (TRUE METRIX METER) w/Device KIT USE AS DIRECTED   Calcium Carbonate-Vitamin D (CALCIUM-VITAMIN D3 PO) Take 1 tablet by mouth daily. Reported on 09/28/2015   Cyanocobalamin (B-12 PO) 5000mg  daily   glucose blood (TRUE METRIX BLOOD GLUCOSE TEST) test strip Use as instructed to check blood  sugar before meals.   hydrochlorothiazide (HYDRODIURIL) 12.5 MG tablet Take 12.5 mg by mouth daily.   hydrochlorothiazide (HYDRODIURIL) 25 MG tablet Take 1 tablet (25 mg total) by mouth daily.   mesalamine (LIALDA) 1.2 g EC tablet Take 2 tablets (2.4 g total) by mouth 2 (two) times daily.   metFORMIN (GLUCOPHAGE) 1000 MG tablet TAKE 1 TABLET(1000 MG) BY MOUTH  TWICE DAILY WITH A MEAL   Multiple Vitamins-Minerals (CENTRUM SILVER PO) Take 1 tablet by mouth daily. Reported on 09/28/2015   Omega-3 Fatty Acids (FISH OIL) 300 MG CAPS Take 300 mg by mouth daily.   predniSONE (DELTASONE) 10 MG tablet Take 4 tablets (40 mg total) by mouth daily with breakfast for 5 days, THEN 3 tablets (30 mg total) daily with breakfast for 5 days, THEN 2 tablets (20 mg total) daily with breakfast for 10 days, THEN 1 tablet (10 mg total) daily with breakfast for 10 days, THEN 0.5 tablets (5 mg total) daily with breakfast for 10 days.   Saline (SIMPLY SALINE) 0.9 % AERS Place 2 each into the nose as directed. Use nightly for sinus hygiene long-term.  Can also be used as many times daily as desired to assist with clearing congested sinuses.   TRUEplus Lancets 33G MISC USE TO CHECK SUGAR BEFORE MEALS 3 TIMES DAILY   ustekinumab (STELARA) 90 MG/ML SOSY injection Inject 1syringe under the skin every 8 weeks.   Past Medical History:  Diagnosis Date    vitamin D deficiency 01/24/2013   01/23/13 - level 29 - supplements 50k IU weekly x 6 recommended    Anabolic steroid abuse    PT. DENIES 03/30/19   Arthritis    Avascular necrosis of femoral head (HCC)    bilateral   Bleeding hemorrhoids    Diabetes mellitus without complication ([place]) 0000000   type 2   Drug-induced acute pancreatitis - 6MP 09/07/2015   Erectile dysfunction    Hypertension    Left sided ulcerative colitis ([place])    Psoriasis caused by Humira 04/13/2019   Rhabdomyolysis 12/25/2018   Spinal stenosis of lumbar region at multiple levels    Past Surgical History:  Procedure Laterality Date   COLONOSCOPY     multiple   KNEE ARTHROSCOPY Right    PROSTATE BIOPSY  08/2020   Negative for cancer   SIGMOIDOSCOPY     Social History   Social History Narrative   Single never married. 1 son 48 in 2018. 1 grandson 72 in 2018. 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   Review of Systems As per HPI  Objective:   Physical Exam BP (!) 150/100   Pulse 90   Ht 5\' 11"  (1.803 m)   Wt 251 lb (113.9 kg)   SpO2 95%   BMI 35.01 kg/m  Abdomen soft nontender no organomegaly or mass

## 2022-08-22 NOTE — Patient Instructions (Signed)
Great to see you today!!  _______________________________________________________  If your blood pressure at your visit was 140/90 or greater, please contact your primary care physician to follow up on this.  _______________________________________________________  If you are age 69 or older, your body mass index should be between 23-30. Your Body mass index is 35.01 kg/m. If this is out of the aforementioned range listed, please consider follow up with your Primary Care Provider.  If you are age 37 or younger, your body mass index should be between 19-25. Your Body mass index is 35.01 kg/m. If this is out of the aformentioned range listed, please consider follow up with your Primary Care Provider.   ________________________________________________________  The [place] [facility] providers would like to encourage you to use [facility] to communicate with providers for non-urgent requests or questions.  Due to long hold times on the telephone, sending your provider a message by [facility] may be a faster and more efficient way to get a response.  Please allow 48 business hours for a response.  Please remember that this is for non-urgent requests.  _______________________________________________________   Once we get your lab reports we'll be in touch.   I appreciate the opportunity to care for you. Silvano Rusk, MD, [facility]

## 2022-08-24 LAB — USTEKINUMAB AND ANTI-USTEK AB
Anti-Ustekinumab Antibody: <40 ng/mL
Ustekinumab: 0.5 ug/mL

## 2022-08-27 ENCOUNTER — Encounter: Payer: Medicare HMO | Admitting: Family Medicine

## 2022-09-03 ENCOUNTER — Telehealth: Payer: Self-pay | Admitting: Pharmacy Technician

## 2022-09-03 NOTE — Telephone Encounter (Signed)
-----   Message from Patti E [place], [place] sent at 07/27/2022  2:19 PM EST ----- I spoke with Frankey Poot and he said he knows his Doristine Johns got approved but he doesn't know if the [place] got approved. Can you tell on your end if it did. Thank you for your help QD!!

## 2022-09-04 NOTE — Telephone Encounter (Signed)
Received a fax from  [employer]  regarding an approval for [facility] patient assistance from 2.26.24 to 12.31.24. Approval letter sent to scan center.  Phone number: [phone]   New script for Q4 week frequency has been sent to pharmacy at [phone]. Called pt to provide updated information, and had to leave a HIPAA compliant message on voicemail. Will follow up tomorrow with [name] and pt.

## 2022-09-06 ENCOUNTER — Telehealth: Payer: Self-pay

## 2022-09-06 ENCOUNTER — Telehealth: Payer: Self-pay | Admitting: Internal Medicine

## 2022-09-06 NOTE — Telephone Encounter (Signed)
Patient is calling states he got PJ's message and would like to know how to move forward, says he got accepted into assisted care. Please advise

## 2022-09-06 NOTE — Telephone Encounter (Signed)
Angel Frazier got his Stelara today and wanted to know when to start it. His last shot was March 13th so Dr Carlean Purl said do it April 13th. He is to do it monthly now. He said he was doing it every 12 weeks. I will update his medicine list.

## 2022-09-06 NOTE — Telephone Encounter (Signed)
I called and left Angel Frazier a detailed message to call [phone] to get assistance with getting the Stelara.

## 2022-09-11 ENCOUNTER — Telehealth: Payer: Self-pay | Admitting: Family Medicine

## 2022-09-11 NOTE — Telephone Encounter (Signed)
Patient states his eye exam was done on 07/19/22 at [facility] ph# [phone]

## 2022-09-25 ENCOUNTER — Other Ambulatory Visit: Payer: Self-pay | Admitting: Internal Medicine

## 2022-09-25 ENCOUNTER — Other Ambulatory Visit: Payer: Self-pay | Admitting: Family Medicine

## 2022-09-25 DIAGNOSIS — E1165 Type 2 diabetes mellitus with hyperglycemia: Secondary | ICD-10-CM

## 2022-09-27 ENCOUNTER — Other Ambulatory Visit: Payer: Self-pay

## 2022-09-27 MED ORDER — BD SWAB SINGLE USE REGULAR PADS: MEDICATED_PAD | 12 refills | Status: AC

## 2022-09-27 MED ORDER — HYDROCHLOROTHIAZIDE 25 MG PO TABS: 25.0000 mg | ORAL_TABLET | Freq: Every day | ORAL | 3 refills | Status: DC

## 2022-11-05 ENCOUNTER — Telehealth: Payer: Self-pay | Admitting: Internal Medicine

## 2022-11-05 NOTE — Telephone Encounter (Signed)
Patient called stating he is currently taking the medication prednisone. States he recently had a injury and started taking antibiotics. He is requesting a call back regarding taking medications together. States he think is caused a colitis flare up. Please advise, thank you.

## 2022-11-05 NOTE — Telephone Encounter (Signed)
Patient called in with complaints of frequent, loose stools (6-7/day) for the last month or so. Denies any bleeding, pain. He states he was on prednisone back in February and it seemed to help, until he was put on antibiotics d/t shoulder injury (OV notes from 08/22/22 say Meloxicam). Pt could not recall medication. He feels that his loose stools are becoming more frequent & not improving. Traveling is becoming more difficult. Currently on SQ Stelera. Patient made aware that MD is currently out of office today, but will send to him to review & we will be in touch with any recommendations.

## 2022-11-06 MED ORDER — PREDNISONE 10 MG PO TABS: ORAL_TABLET | ORAL | 0 refills | Status: AC

## 2022-11-06 NOTE — Telephone Encounter (Signed)
I have sent in a prednisone taper for the patient.  Please make him an appointment to see me in July in a banding spot or a nurse visit there are several open

## 2022-11-06 NOTE — Telephone Encounter (Signed)
Spoke with patient regarding prescription sent in by MD. OV scheduled for 12/18/22 at 8:50 am with Dr. Leone Payor. Pt verbalized all understanding.

## 2022-12-17 ENCOUNTER — Ambulatory Visit: Payer: Medicare HMO | Admitting: Internal Medicine

## 2022-12-17 ENCOUNTER — Encounter: Payer: Self-pay | Admitting: Internal Medicine

## 2022-12-17 VITALS — BP 106/70 | HR 80 | Ht 71.0 in | Wt 233.8 lb

## 2022-12-17 DIAGNOSIS — Z7984 Long term (current) use of oral hypoglycemic drugs: Secondary | ICD-10-CM

## 2022-12-17 DIAGNOSIS — E785 Hyperlipidemia, unspecified: Secondary | ICD-10-CM

## 2022-12-17 DIAGNOSIS — E1169 Type 2 diabetes mellitus with other specified complication: Secondary | ICD-10-CM | POA: Diagnosis not present

## 2022-12-17 DIAGNOSIS — E1165 Type 2 diabetes mellitus with hyperglycemia: Secondary | ICD-10-CM

## 2022-12-17 DIAGNOSIS — E119 Type 2 diabetes mellitus without complications: Secondary | ICD-10-CM | POA: Diagnosis not present

## 2022-12-17 LAB — POCT GLYCOSYLATED HEMOGLOBIN (HGB A1C): Hemoglobin A1C: 6.2 % — AB (ref 4.0–5.6)

## 2022-12-17 MED ORDER — METFORMIN HCL 1000 MG PO TABS: ORAL_TABLET | ORAL | 3 refills | Status: DC

## 2022-12-17 NOTE — Progress Notes (Signed)
Patient ID: EATHAN GROMAN, male   DOB: 01-27-1956, 67 y.o.   MRN: [id number]  HPI: ALVERTO SHEDD is a 67 y.o.-year-old male, returning for follow-up for ketosis-prone diabetes, dx in 2017, non-insulin-dependent, controlled, without long-term complications. Pt. previously saw Dr. Everardo All, but last visit with me 6 months ago.  Interim history: No increased urination, blurry vision, nausea, chest pain.  Before last visit, he gained 12 pounds during the Holidays as he could not go to the gym due to a URI.  He restarted since last visit. He lost 23 lbs since last OV - eating less snacks, increased exercise.  Reviewed HbA1c: Lab Results  Component Value Date   HGBA1C 6.4 (A) 06/11/2022   HGBA1C 6.7 (H) 02/12/2022   HGBA1C 6.2 (A) 08/21/2021   HGBA1C 5.6 02/20/2021   HGBA1C 6.4 (A) 08/19/2020   HGBA1C 7.1 (H) 08/09/2020   HGBA1C 6.7 (A) 02/15/2020   HGBA1C 6.1 (A) 10/12/2019   HGBA1C 6.3 (A) 06/15/2019   HGBA1C 5.9 (A) 02/23/2019   Pt is on a regimen of: - Metformin 1000 mg 2x a day, with meals Rybelsus was too expensive. He was on insulin after diagnosis but then was able to come off.  Pt checks his sugars 1-3x a day and they are: - am: 110-120, occas. 150-160 with higher carb intake for dinner >> 110-115, 144 (steak) >> 100-110 - 2h after b'fast: n/c - before lunch: n/c - 2h after lunch: n/c - before dinner: 110-150 >> n/c - 2h after dinner: n/c - bedtime: n/c - nighttime: n/c Lowest sugar was 95 >> 100. Highest sugar was 160 >> 144.  Glucometer: True Metrix  He saw nutrition in the past >> decreased carb intake afterwards.  - no CKD, last BUN/creatinine:  Lab Results  Component Value Date   BUN 17 02/12/2022   BUN 20 08/10/2021   CREATININE 0.99 02/12/2022   CREATININE 0.96 08/10/2021  He is not on ACE inhibitor/ARB.  -+ HL; last set of lipids: Lab Results  Component Value Date   CHOL 115 08/10/2021   HDL 41.20 08/10/2021   LDLCALC 64 08/10/2021   LDLDIRECT 64.0  12/08/2019   TRIG 53.0 08/10/2021   CHOLHDL 3 08/10/2021  On Lipitor 40 mg daily, omega-3 fatty acids.  - last eye exam was in 08/2022. No DR reportedly.  - no numbness and tingling in his feet.  Last foot exam 02/19/2022.  He has a history of ulcerative colitis, psoriasis, osteoarthritis, history of pancreatitis from 6-MP, spinal stenosis, vitamin D deficiency.  ROS: + see HPI  Past Medical History:  Diagnosis Date    vitamin D deficiency 01/24/2013   01/23/13 - level 29 - supplements 50k IU weekly x 6 recommended    Anabolic steroid abuse    PT. DENIES 03/30/19   Arthritis    Avascular necrosis of femoral head (HCC)    bilateral   Bleeding hemorrhoids    Diabetes mellitus without complication (HCC) 2017   type 2   Drug-induced acute pancreatitis - 09/07/2015   Erectile dysfunction    Hypertension    Left sided ulcerative colitis (HCC)    Psoriasis caused by Humira 04/13/2019   Rhabdomyolysis 12/25/2018   Spinal stenosis of lumbar region at multiple levels    Past Surgical History:  Procedure Laterality Date   COLONOSCOPY     multiple   KNEE ARTHROSCOPY Right    PROSTATE BIOPSY  08/2020   Negative for cancer   SIGMOIDOSCOPY     Social  History   Socioeconomic History   Marital status: Single    Spouse name: Not on file   Number of children: 1   Years of education: Not on file   Highest education level: Not on file  Occupational History   Occupation: retired    Associate Professor: [employer]  Tobacco Use   Smoking status: Former    Current packs/day: 0.00    Average packs/day: 1 pack/day for 6.0 years (6.0 ttl pk-yrs)    Types: Cigarettes    Start date: 06/04/1994    Quit date: 06/04/2000    Years since quitting: 22.5   Smokeless tobacco: Never  Vaping Use   Vaping status: Never Used  Substance and Sexual Activity   Alcohol use: Not Currently    Comment: occasionally   Drug use: No   Sexual activity: Not Currently  Other Topics Concern   Not on file   Social History Narrative   Single never married. 1 son 43 in 2018. 1 grandson 35 in 2018. 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      Hobbies: enjoys tinkering in his shed, enjoys going to the gym   Social Determinants of Health   Financial Resource Strain: Low Risk  (06/25/2022)   Overall Financial Resource Strain (CARDIA)    Difficulty of Paying Living Expenses: Not hard at all  Food Insecurity: No Food Insecurity (06/25/2022)   Hunger Vital Sign    Worried About Running Out of Food in the Last Year: Never true    Ran Out of Food in the Last Year: Never true  Transportation Needs: No Transportation Needs (06/25/2022)   PRAPARE - Administrator, Civil Service (Medical): No    Lack of Transportation (Non-Medical): No  Physical Activity: Inactive (06/25/2022)   Exercise Vital Sign    Days of Exercise per Week: 0 days    Minutes of Exercise per Session: 0 min  Stress: No Stress Concern Present (06/25/2022)   [employer] of Occupational Health - Occupational Stress Questionnaire    Feeling of Stress : Not at all  Social Connections: Moderately Isolated (06/25/2022)   Social Connection and Isolation Panel [NHANES]    Frequency of Communication with Friends and Family: More than three times a week    Frequency of Social Gatherings with Friends and Family: More than three times a week    Attends Religious Services: More than 4 times per year    Active Member of [employer] or Organizations: No    Attends Banker Meetings: Never    Marital Status: Never married  Intimate Partner Violence: Not At Risk (06/25/2022)   Humiliation, Afraid, Rape, and Kick questionnaire    Fear of Current or Ex-Partner: No    Emotionally Abused: No    Physically Abused: No    Sexually Abused: No   Current Outpatient Medications on File Prior to Visit  Medication Sig Dispense Refill   Alcohol Swabs (B-D SINGLE USE SWABS REGULAR)  PADS USE AS DIRECTED OR NEEDED 300 each 12   amLODipine (NORVASC) 5 MG tablet TAKE 1 TABLET AT BEDTIME 90 tablet 3   Ascorbic Acid (VITAMIN C) 1000 MG tablet Take 1,000 mg by mouth daily. Reported on 09/28/2015     aspirin 81 MG tablet Take 81 mg by mouth daily. Reported on 09/28/2015     atorvastatin (LIPITOR) 40 MG tablet TAKE 1 TABLET ONCE A  WEEK. 13 tablet 3   Blood Glucose Calibration (TRUE METRIX LEVEL 1) Low SOLN Use as directed 1 each 1   blood glucose meter kit and supplies Dispense based on patient and insurance preference. Use up to four times daily as directed. (FOR ICD-9 250.00, 250.01). 1 each 0   Blood Glucose Monitoring Suppl (TRUE METRIX METER) w/Device KIT USE AS DIRECTED 1 kit 1   Calcium Carbonate-Vitamin D (CALCIUM-VITAMIN D3 PO) Take 1 tablet by mouth daily. Reported on 09/28/2015     Cyanocobalamin (B-12 PO) 5000mg  daily     glucose blood (TRUE METRIX BLOOD GLUCOSE TEST) test strip Use as instructed to check blood sugar before meals. 300 each 12   hydrochlorothiazide (HYDRODIURIL) 12.5 MG tablet Take 12.5 mg by mouth daily.     hydrochlorothiazide (HYDRODIURIL) 25 MG tablet Take 1 tablet (25 mg total) by mouth daily. 90 tablet 3   mesalamine (LIALDA) 1.2 g EC tablet Take 2 tablets (2.4 g total) by mouth 2 (two) times daily. 360 tablet 3   metFORMIN (GLUCOPHAGE) 1000 MG tablet TAKE 1 TABLET(1000 MG) BY MOUTH TWICE DAILY WITH A MEAL 180 tablet 3   Multiple Vitamins-Minerals (CENTRUM SILVER PO) Take 1 tablet by mouth daily. Reported on 09/28/2015     Omega-3 Fatty Acids (FISH OIL) 300 MG CAPS Take 300 mg by mouth daily.     Saline (SIMPLY SALINE) 0.9 % AERS Place 2 each into the nose as directed. Use nightly for sinus hygiene long-term.  Can also be used as many times daily as desired to assist with clearing congested sinuses. 127 mL 11   TRUEplus Lancets 33G MISC USE TO CHECK SUGAR BEFORE MEALS 3 TIMES DAILY 300 each 3   ustekinumab (STELARA) 90 MG/ML SOSY injection Inject  1syringe under the skin every 8 weeks. (Patient taking differently: 90 mg. Inject 1syringe under the skin every 4 weeks.) 1 mL 6   No current facility-administered medications on file prior to visit.   Allergies  Allergen Reactions   Humira [Adalimumab] Rash    psoriasis   Mercaptopurine Other (See Comments)    Caused Pancreatitis   Family History  Problem Relation Age of Onset   Diabetes Mother    Breast cancer Sister    Diabetes Other        cousin   Hypertension Other    Diabetes type I Son    Colon cancer Neg Hx    Esophageal cancer Neg Hx    Rectal cancer Neg Hx    Stomach cancer Neg Hx    Colon polyps Neg Hx    PE: BP 106/70   Pulse 80   Ht 5\' 11"  (1.803 m)   Wt 233 lb 12.8 oz (106.1 kg)   SpO2 97%   BMI 32.61 kg/m  Wt Readings from Last 3 Encounters:  12/17/22 233 lb 12.8 oz (106.1 kg)  08/22/22 251 lb (113.9 kg)  07/09/22 252 lb (114.3 kg)   Constitutional: overweight, in NAD Eyes:  EOMI, no exophthalmos ENT: no neck masses, no cervical lymphadenopathy Cardiovascular: RRR, No MRG Respiratory: CTA B Musculoskeletal: no deformities Skin:no rashes Neurological: no tremor with outstretched hands Diabetic Foot Exam - Simple   Simple Foot Form Diabetic Foot exam was performed with the following findings: Yes 12/17/2022  8:52 AM  Visual Inspection No deformities, no ulcerations, no other skin breakdown bilaterally: Yes Sensation Testing Intact to touch and monofilament testing bilaterally: Yes Pulse Check Posterior Tibialis and Dorsalis pulse intact bilaterally: Yes Comments  ASSESSMENT: 1.  Ketosis-prone diabetes ([place]), non-insulin-dependent, controlled, without long-term complications, but with hyperglycemia He has ED -unknown if related to diabetes.  2. HL  PLAN:  1. Patient with longstanding, uncontrolled, type 2 diabetes, on oral antidiabetic regimen with metformin only, with good control.  Per review of Dr. George Hugh notes, he has a history of  DKA, but has not required insulin long-term.  Latest HbA1c was 6.4%, improved, at last visit.  At that time, he was only checking sugars in the morning as he was eating dinner late and was not staying up late to check his blood sugars.  I advised him to check some sugars at other times of the day but we did not change his regimen at that time.  At last visit he also had a 12 pound weight gain, attributed to the holidays and the fact that he could not go to the gym due to his URI.  He was planning to restart exercise. -At today's visit, sugars remain controlled and his HbA1c is even lower than before after he started to exercise consistently and cut down on snacks.  He also lost a significant amount of weight, 23 pounds since last visit.  I congratulated him for these changes and advised him to continue.  I refilled his metformin at the same dose.  I also advised him to check some sugars later in the day. - I suggested to:  Patient Instructions  Please continue: - Metformin 1000 mg 2x a day, with meals  Check some sugars at bedtime or 2h after dinner.  Please return in 6 months.  - we checked his HbA1c: 6.2% (lower) - advised to check sugars at different times of the day - 1x a day, rotating check times - advised for yearly eye exams >> he is UTD - need records - return to clinic in 6 months  2. HL -Reviewed latest lipid panel from 08/10/2021: All fractions at goal Lab Results  Component Value Date   CHOL 115 08/10/2021   HDL 41.20 08/10/2021   LDLCALC 64 08/10/2021   LDLDIRECT 64.0 12/08/2019   TRIG 53.0 08/10/2021   CHOLHDL 3 08/10/2021  -He continues on Lipitor 40 mg daily and omega-3 fatty acids without side effects -He is due for another lipid panel -has an appointment with Dr. Durene Cal coming up later this month  Carlus Pavlov, MD PhD [facility]

## 2022-12-17 NOTE — Patient Instructions (Signed)
 Please continue: - Metformin 1000 mg 2x a day, with meals  Check some sugars at bedtime or 2h after dinner.  Please return in 6 months.

## 2022-12-18 ENCOUNTER — Ambulatory Visit: Payer: Medicare HMO | Admitting: Internal Medicine

## 2022-12-18 ENCOUNTER — Encounter: Payer: Self-pay | Admitting: Internal Medicine

## 2022-12-18 VITALS — BP 116/80 | HR 88 | Ht 71.0 in | Wt 238.0 lb

## 2022-12-18 DIAGNOSIS — Z7960 Long term (current) use of unspecified immunomodulators and immunosuppressants: Secondary | ICD-10-CM

## 2022-12-18 DIAGNOSIS — K5100 Ulcerative (chronic) pancolitis without complications: Secondary | ICD-10-CM | POA: Diagnosis not present

## 2022-12-18 NOTE — Patient Instructions (Signed)
Glad your doing well. I will contact you to set up a January appointment.  _______________________________________________________  If your blood pressure at your visit was 140/90 or greater, please contact your primary care physician to follow up on this.  _______________________________________________________  If you are age 69 or older, your body mass index should be between 23-30. Your Body mass index is 33.19 kg/m. If this is out of the aforementioned range listed, please consider follow up with your Primary Care Provider.  If you are age 53 or younger, your body mass index should be between 19-25. Your Body mass index is 33.19 kg/m. If this is out of the aformentioned range listed, please consider follow up with your Primary Care Provider.   ________________________________________________________  The [place] [facility] providers would like to encourage you to use [facility] to communicate with providers for non-urgent requests or questions.  Due to long hold times on the telephone, sending your provider a message by [facility] may be a faster and more efficient way to get a response.  Please allow 48 business hours for a response.  Please remember that this is for non-urgent requests.  _______________________________________________________  I appreciate the opportunity to care for you. Stan Head, MD, [facility]

## 2022-12-18 NOTE — Progress Notes (Signed)
Angel Frazier 67 y.o. August 11, 1955 [id number]  Assessment & Plan:   Encounter Diagnoses  Name Primary?   Universal ulcerative colitis without complication (HCC) Yes   Long-term use of immunosuppressant medication    He is doing well at this time in clinical remission.  Will continue Stelara biologic immunosuppressive therapy every 4 weeks.  See me again in about 6 months routinely sooner as needed.  He is congratulated on the weight loss and encouraged to keep up with exercise and diet modification.    Subjective:  Gastroenterology summary 03/14/04 colonoscopy left UC, asacol initiated No active disease on sigmoidoscopy in 2012 Maintained on mesalamine 4.8 g daily, Asacol HD and then Lialda 12/2012 - flare Tx prednisone 2015-16 - prednisone required 2017-18 prednisone 3-4 x hypersensitivity with mercaptopurine Humira initiated spring 2020 Humira stopped late 2020 due to psoriasis Late 2022, Stelara begun after colonoscopy demonstrated active left and some microscopic right colitis Patient gets assistance with mesalamine and Stelara Colonoscopy 07/09/2022-normal TI, patchy plus diffuse mild colitis seen more pronounced in left colon and biopsies in the right colon (cecum ascending and transverse were normal left colon showed active colitis Patient called with flare late February started on prednisone taper  Ustekinumab levels 0.5 08/13/22 - no Abs - moved to q 4 week dosing   Chief Complaint: Follow-up ulcerative colitis  HPI Angel Frazier is a 67 year old African-American man with the above problems type 2 diabetes mellitus as well as hypertension who is here for follow-up.  When last seen he was having problems and required prednisone for a flare, we worked him up further and changed his Stelara to every 4 weeks and he feels like he is doing well at this time with 2 or 3 formed stools a day no bleeding or pain.  He reduced snacks and is exercising more and has lost 20+ pounds.  Hemoglobin  A1c 6.2% on visit yesterday with Dr. Elvera Lennox.   Wt Readings from Last 3 Encounters:  12/18/22 238 lb (108 kg)  12/17/22 233 lb 12.8 oz (106.1 kg)  08/22/22 251 lb (113.9 kg)    Allergies  Allergen Reactions   Humira [Adalimumab] Rash    psoriasis   Mercaptopurine Other (See Comments)    Caused Pancreatitis   Current Meds  Medication Sig   Alcohol Swabs (B-D SINGLE USE SWABS REGULAR) PADS USE AS DIRECTED OR NEEDED   amLODipine (NORVASC) 5 MG tablet TAKE 1 TABLET AT BEDTIME   Ascorbic Acid (VITAMIN C) 1000 MG tablet Take 1,000 mg by mouth daily. Reported on 09/28/2015   aspirin 81 MG tablet Take 81 mg by mouth daily. Reported on 09/28/2015   atorvastatin (LIPITOR) 40 MG tablet TAKE 1 TABLET ONCE A WEEK.   Blood Glucose Calibration (TRUE METRIX LEVEL 1) Low SOLN Use as directed   blood glucose meter kit and supplies Dispense based on patient and insurance preference. Use up to four times daily as directed. (FOR ICD-9 250.00, 250.01).   Blood Glucose Monitoring Suppl (TRUE METRIX METER) w/Device KIT USE AS DIRECTED   Calcium Carbonate-Vitamin D (CALCIUM-VITAMIN D3 PO) Take 1 tablet by mouth daily. Reported on 09/28/2015   Cyanocobalamin (B-12 PO) 5000mg  daily   glucose blood (TRUE METRIX BLOOD GLUCOSE TEST) test strip Use as instructed to check blood sugar before meals.   hydrochlorothiazide (HYDRODIURIL) 12.5 MG tablet Take 12.5 mg by mouth daily.   hydrochlorothiazide (HYDRODIURIL) 25 MG tablet Take 1 tablet (25 mg total) by mouth daily.   mesalamine (LIALDA) 1.2 g EC tablet  Take 2 tablets (2.4 g total) by mouth 2 (two) times daily.   metFORMIN (GLUCOPHAGE) 1000 MG tablet TAKE 1 TABLET(1000 MG) BY MOUTH TWICE DAILY WITH A MEAL   Multiple Vitamins-Minerals (CENTRUM SILVER PO) Take 1 tablet by mouth daily. Reported on 09/28/2015   Omega-3 Fatty Acids (FISH OIL) 300 MG CAPS Take 300 mg by mouth daily.   Saline (SIMPLY SALINE) 0.9 % AERS Place 2 each into the nose as directed. Use nightly  for sinus hygiene long-term.  Can also be used as many times daily as desired to assist with clearing congested sinuses.   TRUEplus Lancets 33G MISC USE TO CHECK SUGAR BEFORE MEALS 3 TIMES DAILY   ustekinumab (STELARA) 90 MG/ML SOSY injection Inject 1syringe under the skin every 8 weeks. (Patient taking differently: 90 mg. Inject 1syringe under the skin every 4 weeks.)   Past Medical History:  Diagnosis Date    vitamin D deficiency 01/24/2013   01/23/13 - level 29 - supplements 50k IU weekly x 6 recommended    Anabolic steroid abuse    PT. DENIES 03/30/19   Arthritis    Avascular necrosis of femoral head (HCC)    bilateral   Bleeding hemorrhoids    Diabetes mellitus without complication (HCC) 2017   type 2   Drug-induced acute pancreatitis - 09/07/2015   Erectile dysfunction    Hypertension    Left sided ulcerative colitis (HCC)    Psoriasis caused by Humira 04/13/2019   Rhabdomyolysis 12/25/2018   Spinal stenosis of lumbar region at multiple levels    Past Surgical History:  Procedure Laterality Date   COLONOSCOPY     multiple   KNEE ARTHROSCOPY Right    PROSTATE BIOPSY  08/2020   Negative for cancer   SIGMOIDOSCOPY     Social History   Social History Narrative   Single never married. 1 son 16 in 2018. 1 grandson 62 in 2018. 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   Review of Systems As per HPI  Objective:   Physical Exam @BP  (!) 132/90   Pulse 88   Ht 5\' 11"  (1.803 m)   Wt 238 lb (108 kg)   BMI 33.19 kg/m @  General:  NAD Eyes:   anicteric Lungs:  clear Heart::  S1S2 no rubs, murmurs or gallops Abdomen:  soft and nontender, BS+ Ext:   no edema, cyanosis or  clubbing    Data Reviewed:  See above

## 2022-12-28 ENCOUNTER — Other Ambulatory Visit: Payer: Self-pay | Admitting: Family Medicine

## 2022-12-28 ENCOUNTER — Ambulatory Visit (INDEPENDENT_AMBULATORY_CARE_PROVIDER_SITE_OTHER): Payer: Medicare HMO | Admitting: Family Medicine

## 2022-12-28 ENCOUNTER — Encounter: Payer: Self-pay | Admitting: Family Medicine

## 2022-12-28 VITALS — BP 128/82 | HR 78 | Temp 97.8°F | Ht 71.0 in | Wt 237.6 lb

## 2022-12-28 DIAGNOSIS — I10 Essential (primary) hypertension: Secondary | ICD-10-CM | POA: Diagnosis not present

## 2022-12-28 DIAGNOSIS — R6882 Decreased libido: Secondary | ICD-10-CM | POA: Diagnosis not present

## 2022-12-28 DIAGNOSIS — E559 Vitamin D deficiency, unspecified: Secondary | ICD-10-CM | POA: Diagnosis not present

## 2022-12-28 DIAGNOSIS — N529 Male erectile dysfunction, unspecified: Secondary | ICD-10-CM

## 2022-12-28 DIAGNOSIS — Z125 Encounter for screening for malignant neoplasm of prostate: Secondary | ICD-10-CM

## 2022-12-28 DIAGNOSIS — K51019 Ulcerative (chronic) pancolitis with unspecified complications: Secondary | ICD-10-CM | POA: Diagnosis not present

## 2022-12-28 DIAGNOSIS — E1165 Type 2 diabetes mellitus with hyperglycemia: Secondary | ICD-10-CM | POA: Diagnosis not present

## 2022-12-28 DIAGNOSIS — E1169 Type 2 diabetes mellitus with other specified complication: Secondary | ICD-10-CM

## 2022-12-28 DIAGNOSIS — Z0000 Encounter for general adult medical examination without abnormal findings: Secondary | ICD-10-CM

## 2022-12-28 DIAGNOSIS — E785 Hyperlipidemia, unspecified: Secondary | ICD-10-CM | POA: Diagnosis not present

## 2022-12-28 LAB — CBC WITH DIFFERENTIAL/PLATELET
Basophils Absolute: 0 10*3/uL (ref 0.0–0.1)
Basophils Relative: 0.7 % (ref 0.0–3.0)
Eosinophils Absolute: 0.2 10*3/uL (ref 0.0–0.7)
Eosinophils Relative: 3.4 % (ref 0.0–5.0)
HCT: 40.3 % (ref 39.0–52.0)
Hemoglobin: 13.2 g/dL (ref 13.0–17.0)
Lymphocytes Relative: 33.2 % (ref 12.0–46.0)
Lymphs Abs: 1.6 10*3/uL (ref 0.7–4.0)
MCHC: 32.8 g/dL (ref 30.0–36.0)
MCV: 94.7 fl (ref 78.0–100.0)
Monocytes Absolute: 0.6 10*3/uL (ref 0.1–1.0)
Monocytes Relative: 12.3 % — ABNORMAL HIGH (ref 3.0–12.0)
Neutro Abs: 2.4 10*3/uL (ref 1.4–7.7)
Neutrophils Relative %: 50.4 % (ref 43.0–77.0)
Platelets: 219 10*3/uL (ref 150.0–400.0)
RBC: 4.26 Mil/uL (ref 4.22–5.81)
RDW: 14.2 % (ref 11.5–15.5)
WBC: 4.8 10*3/uL (ref 4.0–10.5)

## 2022-12-28 LAB — MICROALBUMIN / CREATININE URINE RATIO
Creatinine,U: 267.7 mg/dL
Microalb Creat Ratio: 0.6 mg/g (ref 0.0–30.0)
Microalb, Ur: 1.5 mg/dL (ref 0.0–1.9)

## 2022-12-28 LAB — COMPREHENSIVE METABOLIC PANEL
ALT: 26 U/L (ref 0–53)
AST: 31 U/L (ref 0–37)
Albumin: 4.1 g/dL (ref 3.5–5.2)
Alkaline Phosphatase: 37 U/L — ABNORMAL LOW (ref 39–117)
BUN: 23 mg/dL (ref 6–23)
CO2: 30 mEq/L (ref 19–32)
Calcium: 9.3 mg/dL (ref 8.4–10.5)
Chloride: 100 mEq/L (ref 96–112)
Creatinine, Ser: 0.98 mg/dL (ref 0.40–1.50)
GFR: 79.1 mL/min (ref >60.00)
Glucose, Bld: 121 mg/dL — ABNORMAL HIGH (ref 70–99)
Potassium: 3.4 mEq/L — ABNORMAL LOW (ref 3.5–5.1)
Sodium: 140 mEq/L (ref 135–145)
Total Bilirubin: 0.7 mg/dL (ref 0.2–1.2)
Total Protein: 7 g/dL (ref 6.0–8.3)

## 2022-12-28 LAB — LIPID PANEL
Cholesterol: 148 mg/dL (ref 0–200)
HDL: 46.6 mg/dL (ref >39.00)
LDL Cholesterol: 82 mg/dL (ref 0–99)
NonHDL: 101.53
Total CHOL/HDL Ratio: 3
Triglycerides: 97 mg/dL (ref 0.0–149.0)
VLDL: 19.4 mg/dL (ref 0.0–40.0)

## 2022-12-28 LAB — PSA, MEDICARE: PSA: 1.19 ng/ml (ref 0.10–4.00)

## 2022-12-28 LAB — VITAMIN D 25 HYDROXY (VIT D DEFICIENCY, FRACTURES): VITD: 52.17 ng/mL (ref 30.00–100.00)

## 2022-12-28 LAB — TESTOSTERONE: Testosterone: 192.14 ng/dL — ABNORMAL LOW (ref 300.00–890.00)

## 2022-12-28 MED ORDER — POTASSIUM CHLORIDE CRYS ER 20 MEQ PO TBCR: 20.0000 meq | EXTENDED_RELEASE_TABLET | ORAL | 3 refills | Status: DC

## 2022-12-28 NOTE — Patient Instructions (Addendum)
Let us know if you get any COVID vaccines this fall.  Please stop by lab before you go If you have mychart- we will send your results within 3 business days of [place] receiving them.  If you do not have mychart- we will call you about results within 5 business days of [place] receiving them.  *please also note that you will see labs on mychart as soon as they post. I will later go in and write notes on them- will say "notes from Dr. Durene Cal"   Recommended follow up: Return in about 6 months (around 06/30/2023) for followup or sooner if needed.Schedule b4 you leave.

## 2022-12-28 NOTE — Progress Notes (Signed)
Phone: [phone]   Subjective:  Patient presents today for their annual physical. Chief complaint-noted.   See problem oriented charting- ROS- full  review of systems was completed and negative  Per full ROS sheet completed by patient  The following were reviewed and entered/updated in epic: Past Medical History:  Diagnosis Date    vitamin D deficiency 01/24/2013   01/23/13 - level 29 - supplements 50k IU weekly x 6 recommended    Anabolic steroid abuse    PT. DENIES 03/30/19   Arthritis    Avascular necrosis of femoral head (HCC)    bilateral   Bleeding hemorrhoids    Diabetes mellitus without complication (HCC) 2017   type 2   Drug-induced acute pancreatitis - 09/07/2015   Erectile dysfunction    Hypertension    Left sided ulcerative colitis (HCC)    Psoriasis caused by Humira 04/13/2019   Rhabdomyolysis 12/25/2018   Spinal stenosis of lumbar region at multiple levels    Patient Active Problem List   Diagnosis Date Noted   Type 2 diabetes mellitus with hyperglycemia, without long-term current use of insulin (HCC) 12/22/2018    Priority: High   Long-term use of immunosuppressant medication Stelara 07/12/2015    Priority: High   Universal ulcerative colitis (HCC) 08/11/2008    Priority: High   Hyperlipidemia associated with type 2 diabetes mellitus (HCC) 01/20/2019    Priority: Medium    Essential hypertension 01/20/2019    Priority: Medium    Pulmonary nodules 09/07/2015    Priority: Medium    Former smoker 09/17/2019    Priority: Low   Long term current use of systemic steroids 11/18/2014    Priority: Low   History of avascular necrosis  11/18/2014    Priority: Low   Vitamin D deficiency 01/24/2013    Priority: Low   ED (erectile dysfunction) 05/21/2012    Priority: Low   Psoriasis caused by Humira 04/13/2019   Past Surgical History:  Procedure Laterality Date   COLONOSCOPY     multiple   KNEE ARTHROSCOPY Right    PROSTATE BIOPSY  08/2020   Negative  for cancer   SIGMOIDOSCOPY      Family History  Problem Relation Age of Onset   Diabetes Mother    Breast cancer Sister    Diabetes Other        cousin   Hypertension Other    Diabetes type I Son    Colon cancer Neg Hx    Esophageal cancer Neg Hx    Rectal cancer Neg Hx    Stomach cancer Neg Hx    Colon polyps Neg Hx     Medications- reviewed and updated Current Outpatient Medications  Medication Sig Dispense Refill   Alcohol Swabs (B-D SINGLE USE SWABS REGULAR) PADS USE AS DIRECTED OR NEEDED 300 each 12   amLODipine (NORVASC) 5 MG tablet TAKE 1 TABLET AT BEDTIME 90 tablet 3   Ascorbic Acid (VITAMIN C) 1000 MG tablet Take 1,000 mg by mouth daily. Reported on 09/28/2015     aspirin 81 MG tablet Take 81 mg by mouth daily. Reported on 09/28/2015     atorvastatin (LIPITOR) 40 MG tablet TAKE 1 TABLET ONCE A WEEK. 13 tablet 3   Blood Glucose Calibration (TRUE METRIX LEVEL 1) Low SOLN Use as directed 1 each 1   blood glucose meter kit and supplies Dispense based on patient and insurance preference. Use up to four times daily as directed. (FOR ICD-9 250.00, 250.01). 1 each 0  Blood Glucose Monitoring Suppl (TRUE METRIX METER) w/Device KIT USE AS DIRECTED 1 kit 1   Calcium Carbonate-Vitamin D (CALCIUM-VITAMIN D3 PO) Take 1 tablet by mouth daily. Reported on 09/28/2015     Cyanocobalamin (B-12 PO) 5000mg  daily     glucose blood (TRUE METRIX BLOOD GLUCOSE TEST) test strip Use as instructed to check blood sugar before meals. 300 each 12   hydrochlorothiazide (HYDRODIURIL) 25 MG tablet Take 1 tablet (25 mg total) by mouth daily. 90 tablet 3   mesalamine (LIALDA) 1.2 g EC tablet Take 2 tablets (2.4 g total) by mouth 2 (two) times daily. 360 tablet 3   metFORMIN (GLUCOPHAGE) 1000 MG tablet TAKE 1 TABLET(1000 MG) BY MOUTH TWICE DAILY WITH A MEAL 180 tablet 3   Multiple Vitamins-Minerals (CENTRUM SILVER PO) Take 1 tablet by mouth daily. Reported on 09/28/2015     Omega-3 Fatty Acids (FISH OIL) 300  MG CAPS Take 300 mg by mouth daily.     Saline (SIMPLY SALINE) 0.9 % AERS Place 2 each into the nose as directed. Use nightly for sinus hygiene long-term.  Can also be used as many times daily as desired to assist with clearing congested sinuses. 127 mL 11   TRUEplus Lancets 33G MISC USE TO CHECK SUGAR BEFORE MEALS 3 TIMES DAILY 300 each 3   ustekinumab (STELARA) 90 MG/ML SOSY injection Inject 1syringe under the skin every 8 weeks. (Patient taking differently: 90 mg. Inject 1syringe under the skin every 4 weeks.) 1 mL 6   No current facility-administered medications for this visit.    Allergies-reviewed and updated Allergies  Allergen Reactions   Humira [Adalimumab] Rash    psoriasis   Mercaptopurine Other (See Comments)    Caused Pancreatitis    Social History   Social History Narrative   Single never married. 1 son 51 in 2018. 1 grandson 95 in 2018. 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 Oropharynx normal Neck: no thyromegaly CV: RRR no murmurs rubs or gallops Lungs: CTAB no crackles, wheeze, rhonchi Abdomen: soft/nontender/nondistended/normal bowel sounds. No rebound or guarding.  Ext: no edema Skin: warm, dry Neuro: grossly normal, moves all extremities, PERRLA   Assessment and Plan  67 y.o. male presenting for annual physical.  Health Maintenance counseling: 1. Anticipatory guidance: Patient counseled regarding regular dental exams -q6 months, eye exams -yearly,  avoiding smoking and second hand smoke , limiting alcohol to 2 beverages per day- maybe 5-6 a week , no illicit drugs .   2. Risk factor reduction:  Advised patient of  need for regular exercise and diet rich and fruits and vegetables to reduce risk of heart attack and stroke.  Exercise- doing more outdoor work right now- less in the gym but as cools off may transition back- 5-6 days a week right now.  Diet/weight management-Down 12 pounds from last physical-congratulated efforts and had been down 2 pounds last year as well- cutting down on snacking has been helpful.  Wt Readings from Last 3 Encounters:  12/28/22 237  lb 9.6 oz (107.8 kg)  12/18/22 238 lb (108 kg)  12/17/22 233 lb 12.8 oz (106.1 kg)  3. Immunizations/screenings/ancillary studies-recommended follow-up COVID shot, typically does not tolerate flu shot.  Diabetic eye exam requested.  Also discussed Prevnar 20- opts out for now   Immunization History  Administered Date(s) Administered   Fluad Quad(high Dose 65+) 03/21/2020, 03/04/2021   Influenza Split 02/15/2011   Influenza, Seasonal, Injecte, Preservative Fre 05/21/2012   Influenza,inj,Quad PF,6+ Mos 06/03/2013   Moderna Covid-19 Vaccine Bivalent Booster 58yrs & up 04/19/2021   Moderna SARS-COV2 Booster Vaccination 11/16/2020   Moderna Sars-Covid-2 Vaccination 08/24/2019, 09/21/2019, 04/04/2020   Td 06/04/2001   Tdap 05/21/2012, 03/09/2021   Zoster Recombinant(Shingrix) 04/25/2021, 06/28/2021    4. Prostate cancer screening- prior MRI of the prostate 07/16/2020 and has followed with Dr. Annabell Howells.  Reports prior biopsy that was benign.  We will trend PSA with labs. Last visit 03/26/22 Lab Results  Component Value Date   PSA 0.91 08/10/2021   PSA 0.76 09/17/2019   PSA 0.90 12/04/2018  5. Colon cancer screening -July 09, 2022 with 1 year repeat planned -due to ulcerative colitis  6. Skin cancer screening- patient has seen dermatology due to psoriasis worse on hands from Humira- later started on stelara- no derm now.  advised regular sunscreen use- prefers to cover up. Denies worrisome, changing, or new skin lesions.  7. Smoking associated  screening (lung cancer screening, AAA screen 65-75, UA)- former smoker-patient quit smoking in 2002.  UAs with urology now. -CT abdomen and pelvis from August 19, 2015 without mention of aneurysm-do not need to repeat evaluation with AAA scan  8. STD screening -monogamous with GF - wants to hold off   Status of chronic or acute concerns   #hypertension S: medication: amlodipine 5 mg, hctz 25 mg Home readings #s: 120/80's at home BP Readings from Last 3 Encounters:  12/28/22 128/82  12/18/22 116/80  12/17/22 106/70  A/P: well controlled per [id number] continue current medications   #hyperlipidemia S: Medication:Atorvastatin 40 mg weekly Lab Results  Component Value Date   CHOL 115 08/10/2021   HDL 41.20 08/10/2021   LDLCALC 64 08/10/2021   LDLDIRECT 64.0 12/08/2019   TRIG 53.0 08/10/2021   CHOLHDL 3 08/10/2021  A/P: hopefully stable- update lipid panel today. Continue current meds for now   % Diabetes-follows with endocrinology now Dr. Elvera Lennox with A1c typically under 7 S: Medication:Metformin 1000 mg twice daily-takes B12 while on this   Lab Results  Component Value Date   HGBA1C 6.4 (A) 06/11/2022   HGBA1C 6.7 (H) 02/12/2022   HGBA1C 6.2 (A) 08/21/2021  A/P: a1c was checked 7/15/ 24 and at 6.2- reached out to Dr. Elvera Lennox to have her team log this- continue current medications   #Ulcerative colitis-follows with GI S: Medication: Lialda, Stelara every 1 month -humira lead to psoriasis A/P: has been doing well on this - continue current medications and follow up with gastroenterology    #Vitamin D deficiency S: Medication: 1000 units Last vitamin D Lab Results  Component Value Date   VD25OH 56.22 08/10/2021  A/P: hopefully stable- update vitmain D  today. Continue current meds for now    #Pulmonary nodule follows with Dr. Sherene Sires -last exam 02/07/22 and has visit with Dr. Sherene Sires in mid August for recheck  #Ed - he is interested to see if testosterone adequate- will check with labs.  Some low libido  Recommended follow up: Return in about 6 months (around 06/30/2023) for followup or sooner  if needed.Schedule b4 you leave. Future Appointments  Date Time Provider Department Center  01/15/2023  9:00 AM Nyoka Cowden, MD [facility] None  06/24/2023  8:20 AM Carlus Pavlov, MD [facility] None  07/02/2023  9:15 AM [facility] ANNUAL WELLNESS VISIT 1 [facility]   Lab/Order associations: fasting   ICD-10-CM   1. Preventative health care  Z00.00     2. Universal ulcerative colitis with complication (HCC)  K51.019     3. Type 2 diabetes mellitus with hyperglycemia, without long-term current use of insulin (HCC)  E11.65 Comprehensive metabolic panel    CBC with Differential/Platelet    Lipid panel    Microalbumin / creatinine urine ratio    4. Essential hypertension  I10     5. Hyperlipidemia associated with type 2 diabetes mellitus (HCC)  E11.69 Comprehensive metabolic panel   N02.7 CBC with Differential/Platelet    Lipid panel    6. Vitamin D deficiency  E55.9 VITAMIN D 25 Hydroxy (Vit-D Deficiency, Fractures)    7. Screening for prostate cancer  Z12.5 PSA, Medicare    8. Erectile dysfunction, unspecified erectile dysfunction type  N52.9 Testosterone    9. Low libido  R68.82 Testosterone     No orders of the defined types were placed in this encounter.   Return precautions advised.  Tana Conch, MD

## 2022-12-31 NOTE — Addendum Note (Signed)
Addended by: Pollie Meyer on: 12/31/2022 08:33 AM   Modules accepted: Orders

## 2023-01-07 ENCOUNTER — Ambulatory Visit (INDEPENDENT_AMBULATORY_CARE_PROVIDER_SITE_OTHER): Payer: Medicare HMO

## 2023-01-07 ENCOUNTER — Telehealth: Payer: Self-pay | Admitting: Family Medicine

## 2023-01-07 ENCOUNTER — Other Ambulatory Visit: Payer: Self-pay

## 2023-01-07 ENCOUNTER — Ambulatory Visit (HOSPITAL_COMMUNITY)
Admission: EM | Admit: 2023-01-07 | Discharge: 2023-01-07 | Disposition: A | Discharge status: Home / Self Care | Payer: Medicare HMO | Attending: Family Medicine | Admitting: Family Medicine

## 2023-01-07 ENCOUNTER — Encounter (HOSPITAL_COMMUNITY): Payer: Self-pay | Admitting: Emergency Medicine

## 2023-01-07 DIAGNOSIS — R0602 Shortness of breath: Secondary | ICD-10-CM

## 2023-01-07 DIAGNOSIS — R0789 Other chest pain: Secondary | ICD-10-CM

## 2023-01-07 DIAGNOSIS — R5383 Other fatigue: Secondary | ICD-10-CM | POA: Diagnosis not present

## 2023-01-07 DIAGNOSIS — R110 Nausea: Secondary | ICD-10-CM | POA: Diagnosis not present

## 2023-01-07 DIAGNOSIS — R079 Chest pain, unspecified: Secondary | ICD-10-CM | POA: Diagnosis not present

## 2023-01-07 DIAGNOSIS — J189 Pneumonia, unspecified organism: Secondary | ICD-10-CM | POA: Diagnosis not present

## 2023-01-07 DIAGNOSIS — R918 Other nonspecific abnormal finding of lung field: Secondary | ICD-10-CM | POA: Diagnosis not present

## 2023-01-07 LAB — CBC WITH DIFFERENTIAL/PLATELET
Abs Immature Granulocytes: 0.02 10*3/uL (ref 0.00–0.07)
Basophils Absolute: 0 10*3/uL (ref 0.0–0.1)
Basophils Relative: 0 %
Eosinophils Absolute: 0.2 10*3/uL (ref 0.0–0.5)
Eosinophils Relative: 4 %
HCT: 41.7 % (ref 39.0–52.0)
Hemoglobin: 14.1 g/dL (ref 13.0–17.0)
Immature Granulocytes: 0 %
Lymphocytes Relative: 39 %
Lymphs Abs: 1.8 10*3/uL (ref 0.7–4.0)
MCH: 31.9 pg (ref 26.0–34.0)
MCHC: 33.8 g/dL (ref 30.0–36.0)
MCV: 94.3 fL (ref 80.0–100.0)
Monocytes Absolute: 0.7 10*3/uL (ref 0.1–1.0)
Monocytes Relative: 14 %
Neutro Abs: 2 10*3/uL (ref 1.7–7.7)
Neutrophils Relative %: 43 %
Platelets: 232 10*3/uL (ref 150–400)
RBC: 4.42 MIL/uL (ref 4.22–5.81)
RDW: 13.2 % (ref 11.5–15.5)
WBC: 4.7 10*3/uL (ref 4.0–10.5)
nRBC: 0 % (ref 0.0–0.2)

## 2023-01-07 LAB — POCT URINALYSIS DIP (MANUAL ENTRY)
Blood, UA: NEGATIVE
Glucose, UA: NEGATIVE mg/dL
Ketones, POC UA: NEGATIVE mg/dL
Leukocytes, UA: NEGATIVE
Nitrite, UA: NEGATIVE
Protein Ur, POC: 30 mg/dL — AB
Spec Grav, UA: 1.025 (ref 1.010–1.025)
Urobilinogen, UA: 0.2 E.U./dL
pH, UA: 5.5 (ref 5.0–8.0)

## 2023-01-07 LAB — COMPREHENSIVE METABOLIC PANEL
ALT: 29 U/L (ref 0–44)
AST: 31 U/L (ref 15–41)
Albumin: 3.8 g/dL (ref 3.5–5.0)
Alkaline Phosphatase: 36 U/L — ABNORMAL LOW (ref 38–126)
Anion gap: 12 (ref 5–15)
BUN: 25 mg/dL — ABNORMAL HIGH (ref 8–23)
CO2: 28 mmol/L (ref 22–32)
Calcium: 9.5 mg/dL (ref 8.9–10.3)
Chloride: 97 mmol/L — ABNORMAL LOW (ref 98–111)
Creatinine, Ser: 1.11 mg/dL (ref 0.61–1.24)
GFR, Estimated: >60 mL/min (ref >60)
Glucose, Bld: 103 mg/dL — ABNORMAL HIGH (ref 70–99)
Potassium: 3.2 mmol/L — ABNORMAL LOW (ref 3.5–5.1)
Sodium: 137 mmol/L (ref 135–145)
Total Bilirubin: 0.7 mg/dL (ref 0.3–1.2)
Total Protein: 7.6 g/dL (ref 6.5–8.1)

## 2023-01-07 MED ORDER — DOXYCYCLINE HYCLATE 100 MG PO CAPS: 100.0000 mg | ORAL_CAPSULE | Freq: Two times a day (BID) | ORAL | 0 refills | Status: DC

## 2023-01-07 MED ORDER — AMOXICILLIN-POT CLAVULANATE 875-125 MG PO TABS: 1.0000 | ORAL_TABLET | Freq: Two times a day (BID) | ORAL | 0 refills | Status: DC

## 2023-01-07 NOTE — ED Triage Notes (Addendum)
Patient Presents to [facility] for evaluation after he got off work Friday and felt fatigued and nauseous.  Contiunued through the weekend, worsening, fatigue, body aches, and occasional left sided chest pressure.  Now this mornin, muscles are sore and hurt worse with movement, especially to diaphragm and left and right flank.  Patient also started a potassium prescription from blood work at PCP last Monday

## 2023-01-07 NOTE — Discharge Instructions (Signed)
We are treating you for pneumonia.  Please start Augmentin twice daily for 10 days.  Use doxycycline 100 mg twice daily for 10 days.  Make sure to stay out of the sun while on doxycycline as it can cause you to have a very severe sunburn.  Use over-the-counter medications including Tylenol, Mucinex, Flonase.  Make sure that you rest and drink plenty of fluid.  I will contact you if lab work is abnormal.  Follow-up with your primary care within the week.  You need to have a repeat chest x-ray within 6 to 8 weeks.  If anything worsens and you have worsening cough, shortness of breath, fever, additional chest pain, weakness you need to go to the emergency room.

## 2023-01-07 NOTE — Telephone Encounter (Signed)
Patient requests to be called to be given/discuss Lab results

## 2023-01-07 NOTE — ED Provider Notes (Signed)
[facility]    CSN: [id number] Arrival date & time: 01/07/23  1029      History   Chief Complaint Chief Complaint  Patient presents with   Chest Pain    HPI Angel Frazier is a 67 y.o. male.   Patient presents today with the 4-day history of intermittent left chest wall pain in the midaxillary line.  He denies any injury or change in activity before symptoms began.  He does report he has been feeling poorly and had an episode where he felt fatigued and nauseous.  He has also had some intermittent shortness of breath.  This is slightly worse with activity but he does not notice a significant improvement with rest.  He has noticed a worsening of symptoms with certain movements including stretching of the lateral rib cage.  He denies any recent illness or additional symptoms including sore throat, cough, nausea, vomiting.  He is eating and drinking normally.  He denies any personal history of cardiovascular disease but does have multiple risk factors including being a former smoker, hypertension, hyperlipidemia, diabetes.  He does have a family history of cardiovascular disease in his mother but she was older when diagnosed but does not know her specific age.  Only medication changes add potassium supplement as he was seen by his primary care for his physical Monday was noted to have a mild hypokalemia (3.4).    Past Medical History:  Diagnosis Date    vitamin D deficiency 01/24/2013   01/23/13 - level 29 - supplements 50k IU weekly x 6 recommended    Anabolic steroid abuse    PT. DENIES 03/30/19   Arthritis    Avascular necrosis of femoral head (HCC)    bilateral   Bleeding hemorrhoids    Diabetes mellitus without complication (HCC) 2017   type 2   Drug-induced acute pancreatitis - 09/07/2015   Erectile dysfunction    Hypertension    Left sided ulcerative colitis (HCC)    Psoriasis caused by Humira 04/13/2019   Rhabdomyolysis 12/25/2018   Spinal stenosis of lumbar region  at multiple levels     Patient Active Problem List   Diagnosis Date Noted   Former smoker 09/17/2019   Psoriasis caused by Humira 04/13/2019   Hyperlipidemia associated with type 2 diabetes mellitus (HCC) 01/20/2019   Essential hypertension 01/20/2019   Type 2 diabetes mellitus with hyperglycemia, without long-term current use of insulin (HCC) 12/22/2018   Pulmonary nodules 09/07/2015   Long-term use of immunosuppressant medication Stelara 07/12/2015   Long term current use of systemic steroids 11/18/2014   History of avascular necrosis  11/18/2014   Vitamin D deficiency 01/24/2013   ED (erectile dysfunction) 05/21/2012   Universal ulcerative colitis (HCC) 08/11/2008    Past Surgical History:  Procedure Laterality Date   COLONOSCOPY     multiple   KNEE ARTHROSCOPY Right    PROSTATE BIOPSY  08/2020   Negative for cancer   SIGMOIDOSCOPY         Home Medications    Prior to Admission medications   Medication Sig Start Date End Date Taking? Authorizing Provider  amoxicillin-clavulanate (AUGMENTIN) 875-125 MG tablet Take 1 tablet by mouth every 12 (twelve) hours. 01/07/23  Yes ,  K, PA-C  doxycycline (VIBRAMYCIN) 100 MG capsule Take 1 capsule (100 mg total) by mouth 2 (two) times daily. 01/07/23  Yes ,  K, PA-C  Alcohol Swabs (B-D SINGLE USE SWABS REGULAR) PADS USE AS DIRECTED OR NEEDED 09/27/22  [name], MD  amLODipine (NORVASC) 5 MG tablet TAKE 1 TABLET AT BEDTIME 09/25/22   [name], MD  Ascorbic Acid (VITAMIN C) 1000 MG tablet Take 1,000 mg by mouth daily. Reported on 09/28/2015    [provider]  aspirin 81 MG tablet Take 81 mg by mouth daily. Reported on 09/28/2015    [provider]  atorvastatin (LIPITOR) 40 MG tablet TAKE 1 TABLET ONCE A WEEK. 08/08/22   [name], MD  Blood Glucose Calibration (TRUE METRIX LEVEL 1) Low SOLN Use as directed 03/21/20   Romero Belling, MD  blood glucose meter kit and supplies  Dispense based on patient and insurance preference. Use up to four times daily as directed. (FOR ICD-9 250.00, 250.01). 04/09/17   [name], MD  Blood Glucose Monitoring Suppl (TRUE METRIX METER) w/Device KIT USE AS DIRECTED 11/25/20   Romero Belling, MD  Calcium Carbonate-Vitamin D (CALCIUM-VITAMIN D3 PO) Take 1 tablet by mouth daily. Reported on 09/28/2015    [provider]  Cyanocobalamin (B-12 PO) 5000mg  daily    [provider]  glucose blood (TRUE METRIX BLOOD GLUCOSE TEST) test strip Use as instructed to check blood sugar before meals. 03/14/22   Carlus Pavlov, MD  hydrochlorothiazide (HYDRODIURIL) 25 MG tablet Take 1 tablet (25 mg total) by mouth daily. 09/27/22   [name], MD  mesalamine (LIALDA) 1.2 g EC tablet Take 2 tablets (2.4 g total) by mouth 2 (two) times daily. 02/20/21   Iva Boop, MD  metFORMIN (GLUCOPHAGE) 1000 MG tablet TAKE 1 TABLET(1000 MG) BY MOUTH TWICE DAILY WITH A MEAL 12/17/22   Carlus Pavlov, MD  Multiple Vitamins-Minerals (CENTRUM SILVER PO) Take 1 tablet by mouth daily. Reported on 09/28/2015    [provider]  Omega-3 Fatty Acids (FISH OIL) 300 MG CAPS Take 300 mg by mouth daily.    [provider]  potassium chloride SA (KLOR-CON M) 20 MEQ tablet Take 1 tablet (20 mEq total) by mouth once a week. 12/28/22   [name], MD  Saline (SIMPLY SALINE) 0.9 % AERS Place 2 each into the nose as directed. Use nightly for sinus hygiene long-term.  Can also be used as many times daily as desired to assist with clearing congested sinuses. 06/19/22   Lula Olszewski, MD  TRUEplus Lancets 33G MISC USE TO CHECK SUGAR BEFORE MEALS 3 TIMES DAILY 09/25/22   Carlus Pavlov, MD  ustekinumab [facility]) 90 MG/ML SOSY injection Inject 1syringe under the skin every 8 weeks. Patient taking differently: 90 mg. Inject 1syringe under the skin every 4 weeks. 05/29/22   Iva Boop, MD    Family History Family History   Problem Relation Age of Onset   Diabetes Mother    Breast cancer Sister    Diabetes Other        cousin   Hypertension Other    Diabetes type I Son    Colon cancer Neg Hx    Esophageal cancer Neg Hx    Rectal cancer Neg Hx    Stomach cancer Neg Hx    Colon polyps Neg Hx     Social History Social History   Tobacco Use   Smoking status: Former    Current packs/day: 0.00    Average packs/day: 1 pack/day for 6.0 years (6.0 ttl pk-yrs)    Types: Cigarettes    Start date: 06/04/1994    Quit date: 06/04/2000    Years since quitting: 22.6  Smokeless tobacco: Never  Vaping Use   Vaping status: Never Used  Substance Use Topics   Alcohol use: Not Currently    Comment: occasionally   Drug use: No     Allergies   Humira [adalimumab] and Mercaptopurine   Review of Systems Review of Systems  Constitutional:  Positive for activity change. Negative for appetite change, fatigue and fever.  Respiratory:  Positive for shortness of breath. Negative for cough.   Cardiovascular:  Positive for chest pain.  Gastrointestinal:  Negative for abdominal pain, diarrhea, nausea and vomiting.     Physical Exam Triage Vital Signs ED Triage Vitals  Encounter Vitals Group     BP 01/07/23 1055 (!) 126/98     Systolic BP Percentile --      Diastolic BP Percentile --      Pulse Rate 01/07/23 1055 77     Resp 01/07/23 1055 18     Temp --      Temp src --      SpO2 01/07/23 1055 99 %     Weight --      Height --      Head Circumference --      Peak Flow --      Pain Score 01/07/23 1054 7     Pain Loc --      Pain Education --      Exclude from Growth Chart --    No data found.  Updated Vital Signs BP (!) 126/98 (BP Location: Right Arm)   Pulse 77   Temp 97.9 F (36.6 C) (Oral)   Resp 18   SpO2 99%   Visual Acuity Right Eye Distance:   Left Eye Distance:   Bilateral Distance:    Right Eye Near:   Left Eye Near:    Bilateral Near:     Physical Exam Vitals reviewed.   Constitutional:      General: He is awake.     Appearance: Normal appearance. He is well-developed. He is not ill-appearing.     Comments: Very pleasant male who stated age in no acute distress sitting comfortably in exam room  HENT:     Head: Normocephalic and atraumatic.  Cardiovascular:     Rate and Rhythm: Normal rate and regular rhythm.     Heart sounds: Normal heart sounds, S1 normal and S2 normal. No murmur heard. Pulmonary:     Effort: Pulmonary effort is normal.     Breath sounds: Normal breath sounds. No stridor. No wheezing, rhonchi or rales.     Comments: Clear to auscultation bilaterally Chest:     Chest wall: No deformity, swelling or tenderness.     Comments: Pain is not reproducible on exam. Abdominal:     Tenderness: There is no right CVA tenderness or left CVA tenderness.  Neurological:     Mental Status: He is alert.  Psychiatric:        Behavior: Behavior is cooperative.      UC Treatments / Results  Labs (all labs ordered are listed, but only abnormal results are displayed) Labs Reviewed  POCT URINALYSIS DIP (MANUAL ENTRY) - Abnormal; Notable for the following components:      Result Value   Bilirubin, UA small (*)    Protein Ur, POC =30 (*)    All other components within normal limits  CBC WITH DIFFERENTIAL/PLATELET  COMPREHENSIVE METABOLIC PANEL    EKG   Radiology DG Chest 2 View  Result Date: 01/07/2023 CLINICAL DATA:  Fatigue, nausea,  body aches, left-sided chest pressure. EXAM: CHEST - 2 VIEW COMPARISON:  Chest radiograph 11/16/2020 FINDINGS: The cardiomediastinal silhouette is normal There are patchy opacities in the right lower lobe. There is no other focal airspace opacity. There is no pulmonary edema. There is no pleural effusion or pneumothorax. There is unchanged asymmetric elevation of the right hemidiaphragm There is no acute osseous abnormality. IMPRESSION: Patchy opacities in the right lower lobe may reflect pneumonia in the correct  clinical setting. Recommend follow-up radiographs in 6-8 weeks to ensure resolution. Electronically Signed   By: Lesia Hausen M.D.   On: 01/07/2023 13:40    Procedures Procedures (including critical care time)  Medications Ordered in UC Medications - No data to display  Initial Impression / Assessment and Plan / UC Course  I have reviewed the triage vital signs and the nursing notes.  Pertinent labs & imaging results that were available during my care of the patient were reviewed by me and considered in my medical decision making (see chart for details).     Patient is well-appearing, afebrile, nontoxic, nontachycardic.  EKG was obtained that showed normal sinus rhythm with ventricular rate of 75 bpm without ischemic changes; compared to 12/25/2018 tracing no significant change.  Chest x-ray was obtained that showed opacities consistent with a right lower lobe pneumonia.  We did discuss that this is on the opposite side of where he is experiencing the pain but he is also experiencing shortness of breath so will treat for CAP.  Based on his age alone he is a candidate for inpatient observation based on [address] but given he is otherwise well-appearing and stocking saturation is 99% will attempt we will attempt outpatient treatment.  He was given Augmentin and doxycycline.  Discussed that he should avoid prolonged sun exposure with doxycycline due to associated photosensitivity.  Basic labs including CBC and CMP were obtained and are pending.  We will contact him if these are abnormal and consider going to the emergency room.  We discussed that he should have a low threshold for going to the emergency room including any recurrent chest pain, shortness of breath, fever, nausea, vomiting, weakness.  Recommended that he follow-up closely with his primary care ideally within the week.  We also discussed that he will need a repeat x-ray within 6 to 8 weeks to ensure that abnormalities noted today resolved.   All questions answered present satisfaction.  Final Clinical Impressions(s) / UC Diagnoses   Final diagnoses:  Pneumonia of right lower lobe due to infectious organism  Atypical chest pain  Shortness of breath     Discharge Instructions      We are treating you for pneumonia.  Please start Augmentin twice daily for 10 days.  Use doxycycline 100 mg twice daily for 10 days.  Make sure to stay out of the sun while on doxycycline as it can cause you to have a very severe sunburn.  Use over-the-counter medications including Tylenol, Mucinex, Flonase.  Make sure that you rest and drink plenty of fluid.  I will contact you if lab work is abnormal.  Follow-up with your primary care within the week.  You need to have a repeat chest x-ray within 6 to 8 weeks.  If anything worsens and you have worsening cough, shortness of breath, fever, additional chest pain, weakness you need to go to the emergency room.     ED Prescriptions     Medication Sig Dispense Auth. Provider   amoxicillin-clavulanate (AUGMENTIN) 875-125 MG  tablet Take 1 tablet by mouth every 12 (twelve) hours. 20 tablet ,  K, PA-C   doxycycline (VIBRAMYCIN) 100 MG capsule Take 1 capsule (100 mg total) by mouth 2 (two) times daily. 20 capsule , Noberto Retort, PA-C      PDMP not reviewed this encounter.   Jeani Hawking, PA-C 01/07/23 1430

## 2023-01-09 NOTE — Telephone Encounter (Signed)
Called and lm for pt tcb. 

## 2023-01-14 NOTE — Progress Notes (Unsigned)
Subjective:     Patient ID: Angel Frazier, male   DOB: 1955-09-15,   MRN: [id number]  HPI  28 yobm quit smoking 2002 p pna with no residual symptoms and nl baseline cxr and CT ABD for ulcerative colitis  with MPN's confirmed on CT chest so referred to pulmonary clinic 09/28/2015 by Dr Leone Payor.  09/28/2015 1st [facility] office visit/    No symptoms at all - Not limited by breathing from desired activities  / no cough rec CT 04/24/16 1. Stable numerous (at least 12) solid pulmonary nodules scattered throughout both lungs measuring up to 9 mm in the right lower lobe, for which 6 month stability has been demonstrated, suggesting a benign etiology.  2. Stable apical right upper lobe 1.1 cm ground-glass pulmonary nodule. Stable medial right middle lobe ground-glass and possibly cavitary 1.0 cm pulmonary nodule. Given persistence, repeat CT is recommended every 2 years until 5 years of stability   - CT chest 04/29/2017 1. Scattered solid pulmonary nodules measure 8 mm or less in size, are unchanged from 09/21/2015 and are considered benign. 2. 10 mm ground-glass nodule in the medial segment right middle lobe, stable. Follow-up in 2 years is recommended, as clinically indicated, as a low-grade adenocarcinoma cannot be excluded   - CT 05/04/2019 1. Increased size of 9 mm ground-glass nodule in medial left upper lobe. Recommend continued follow-up by chest CT without contrast in 12 months.  - CT 05/19/2020  C/w progressive met dz > - PET 06/09/2020 Persistent bilateral solid and ground-glass pulmonary nodules show low-grade FDG uptake; low-grade carcinoma cannot be excluded.  No evidence of thoracic lymph node or distant metastatic disease.  Focal FDG uptake in the right prostate peripheral zone, which may be due to high-grade prostate carcinoma or prostatitis  >>> urology referral 06/09/2020 >>>  - f/u pulmonary ov 09/07/20 to regroup      06/09/2021  f/u ov/ re: mpns in pt with  UC maint on no resp meds/ on pred for flare UC / followed by Leone Payor   Chief Complaint  Patient presents with   Follow-up    PET scan done 06/02/21- needs to be reviewed. Pt feeling well today and denies any co's.    Dyspnea:  no longer doing treadmill but Not limited by breathing from desired activities   Cough: none  Sleeping: flat bed/ one pillow SABA use: none  02: none  Covid status:   vax x 5  Rec See Dr Annabell Howells this year and let him know about your PET scan     We will call you to schedule a follow up scan sept 2023     01/15/2023  f/u ov/ re: MPNs/ UC  maint on no resp rx  Seen in UC  01/07/23 tired and nauseated  x 4d with L chest discomfort >>> eval = ? 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     Also denies any obvious fluctuation of symptoms with weather or environmental changes or  other aggravating or alleviating factors except as outlined above   No unusual exposure hx or h/o childhood pna/ asthma or knowledge of premature birth.  Current Allergies, Complete Past Medical History, Past Surgical History, Family History, and Social History were reviewed in [employer] record.  ROS  The following are not active complaints unless bolded Hoarseness, sore throat, dysphagia, dental problems, itching, sneezing,  nasal congestion or discharge of excess mucus or purulent secretions, ear ache,   fever, chills, sweats, unintended wt loss or wt gain, classically pleuritic or exertional cp,  orthopnea pnd or arm/hand  swelling  or leg swelling, presyncope, palpitations, abdominal pain, anorexia, nausea, vomiting, diarrhea  or change in bowel habits or change in bladder habits, change in stools or change in urine, dysuria, hematuria,  rash, arthralgias, visual complaints, headache, numbness, weakness or ataxia or problems with walking or coordination,  change in mood or  memory.        Current Meds  Medication Sig   Alcohol Swabs (B-D SINGLE USE SWABS REGULAR) PADS USE AS DIRECTED OR NEEDED   amLODipine (NORVASC) 5 MG tablet TAKE 1 TABLET AT BEDTIME   amoxicillin-clavulanate (AUGMENTIN) 875-125 MG tablet Take 1 tablet by mouth every 12 (twelve) hours.   Ascorbic Acid (VITAMIN C) 1000 MG tablet Take 1,000 mg by mouth daily. Reported on 09/28/2015   aspirin 81 MG tablet Take 81 mg by mouth daily. Reported on 09/28/2015   atorvastatin (LIPITOR) 40 MG tablet TAKE 1 TABLET ONCE A WEEK.   Blood Glucose Calibration (TRUE METRIX LEVEL 1) Low SOLN Use as directed   blood glucose meter kit and supplies Dispense based on patient and insurance preference. Use up to four times daily as directed. (FOR ICD-9 250.00, 250.01).   Blood Glucose Monitoring Suppl (TRUE METRIX METER) w/Device KIT USE AS DIRECTED   Calcium Carbonate-Vitamin D (CALCIUM-VITAMIN D3 PO) Take 1 tablet by mouth daily. Reported on 09/28/2015   Cyanocobalamin (B-12 PO) 5000mg  daily   doxycycline (VIBRAMYCIN) 100 MG capsule Take 1 capsule (100 mg total) by mouth 2 (two) times daily.   glucose blood (TRUE METRIX BLOOD GLUCOSE TEST) test strip Use as instructed to check blood sugar before meals.   hydrochlorothiazide (HYDRODIURIL) 25 MG tablet Take 1 tablet (25 mg total) by mouth daily.   mesalamine (LIALDA) 1.2 g EC tablet Take 2 tablets (2.4 g total) by mouth 2 (two) times daily.   metFORMIN (GLUCOPHAGE) 1000 MG tablet TAKE 1 TABLET(1000 MG) BY MOUTH TWICE DAILY WITH A MEAL   Multiple Vitamins-Minerals (CENTRUM SILVER PO) Take 1 tablet by mouth daily. Reported  on 09/28/2015   Omega-3 Fatty Acids (FISH OIL) 300 MG CAPS Take 300 mg by mouth daily.   potassium chloride SA (KLOR-CON M) 20 MEQ tablet Take 1 tablet (20 mEq total) by mouth once a week.   Saline (SIMPLY SALINE) 0.9 % AERS Place 2 each into the nose as directed. Use nightly for sinus hygiene long-term.  Can also be used as many times daily as desired to assist with clearing congested sinuses.   TRUEplus Lancets 33G MISC USE TO CHECK SUGAR BEFORE MEALS 3 TIMES DAILY   ustekinumab (STELARA) 90 MG/ML SOSY injection Inject 1syringe under the skin every 8 weeks. (Patient taking differently: 90 mg. Inject 1syringe under the skin every 4 weeks.)               Objective:   Physical Exam   Wts  01/15/2023       238  06/09/2021         252   08/16/2020      256   09/28/15 243 lb (110.224 kg)  09/26/15 243 lb (110.224 kg)  09/13/15 241 lb 8 oz (109.544 kg)     Vital signs reviewed  01/15/2023  - Note at rest 02 sats  98% % on RA    General appearance:    stoic amb bm / easily confused with details of care    HEENT : Oropharynx  clear         NECK :  without  apparent JVD/ palpable Nodes/TM    LUNGS: no acc muscle use,  Nl contour chest which is clear to A and P bilaterally without cough on insp or exp maneuvers   CV:  RRR  no s3 or murmur or increase in P2, and no edema   ABD:  soft and nontender    MS:  Nl gait/ ext warm without deformities Or obvious joint restrictions  calf tenderness, cyanosis or clubbing    SKIN: warm and dry without lesions    NEURO:  alert, approp, nl sensorium with  no motor or cerebellar deficits apparent.            I personally reviewed images and   impression as follows:  CXR:   10/07/22 ? RML infiltrate or Atx             Assessment:

## 2023-01-15 ENCOUNTER — Encounter: Payer: Self-pay | Admitting: Internal Medicine

## 2023-01-15 ENCOUNTER — Ambulatory Visit: Payer: Medicare HMO | Admitting: Internal Medicine

## 2023-01-15 ENCOUNTER — Encounter: Payer: Self-pay | Admitting: Family Medicine

## 2023-01-15 ENCOUNTER — Ambulatory Visit (INDEPENDENT_AMBULATORY_CARE_PROVIDER_SITE_OTHER): Payer: Medicare HMO | Admitting: Family Medicine

## 2023-01-15 VITALS — BP 120/84 | HR 90 | Ht 71.0 in | Wt 238.0 lb

## 2023-01-15 VITALS — BP 122/82 | HR 78 | Temp 97.0°F | Ht 71.0 in | Wt 239.8 lb

## 2023-01-15 DIAGNOSIS — R918 Other nonspecific abnormal finding of lung field: Secondary | ICD-10-CM | POA: Diagnosis not present

## 2023-01-15 DIAGNOSIS — R0789 Other chest pain: Secondary | ICD-10-CM

## 2023-01-15 DIAGNOSIS — I10 Essential (primary) hypertension: Secondary | ICD-10-CM

## 2023-01-15 NOTE — Progress Notes (Signed)
Phone [phone] In person visit   Subjective:   Angel Frazier is a 67 y.o. year old very pleasant male patient who presents for/with See problem oriented charting Chief Complaint  Patient presents with   Pneumonia    Pt is here to f/u on UC visit,states he was told he did not have pnuemonia by Dr. Sherene Sires.   Past Medical History-  Patient Active Problem List   Diagnosis Date Noted   Type 2 diabetes mellitus with hyperglycemia, without long-term current use of insulin (HCC) 12/22/2018    Priority: High   Long-term use of immunosuppressant medication Stelara 07/12/2015    Priority: High   Universal ulcerative colitis (HCC) 08/11/2008    Priority: High   Hyperlipidemia associated with type 2 diabetes mellitus (HCC) 01/20/2019    Priority: Medium    Essential hypertension 01/20/2019    Priority: Medium    Pulmonary nodules 09/07/2015    Priority: Medium    Former smoker 09/17/2019    Priority: Low   Long term current use of systemic steroids 11/18/2014    Priority: Low   History of avascular necrosis  11/18/2014    Priority: Low   Vitamin D deficiency 01/24/2013    Priority: Low   ED (erectile dysfunction) 05/21/2012    Priority: Low   Psoriasis caused by Humira 04/13/2019    Medications- reviewed and updated Current Outpatient Medications  Medication Sig Dispense Refill   Alcohol Swabs (B-D SINGLE USE SWABS REGULAR) PADS USE AS DIRECTED OR NEEDED 300 each 12   amLODipine (NORVASC) 5 MG tablet TAKE 1 TABLET AT BEDTIME 90 tablet 3   amoxicillin-clavulanate (AUGMENTIN) 875-125 MG tablet Take 1 tablet by mouth every 12 (twelve) hours. 20 tablet 0   Ascorbic Acid (VITAMIN C) 1000 MG tablet Take 1,000 mg by mouth daily. Reported on 09/28/2015     aspirin 81 MG tablet Take 81 mg by mouth daily. Reported on 09/28/2015     atorvastatin (LIPITOR) 40 MG tablet TAKE 1 TABLET ONCE A WEEK. 13 tablet 3   Blood Glucose Calibration (TRUE METRIX LEVEL 1) Low SOLN Use as directed 1 each 1    blood glucose meter kit and supplies Dispense based on patient and insurance preference. Use up to four times daily as directed. (FOR ICD-9 250.00, 250.01). 1 each 0   Blood Glucose Monitoring Suppl (TRUE METRIX METER) w/Device KIT USE AS DIRECTED 1 kit 1   Calcium Carbonate-Vitamin D (CALCIUM-VITAMIN D3 PO) Take 1 tablet by mouth daily. Reported on 09/28/2015     Cyanocobalamin (B-12 PO) 5000mg  daily     doxycycline (VIBRAMYCIN) 100 MG capsule Take 1 capsule (100 mg total) by mouth 2 (two) times daily. 20 capsule 0   glucose blood (TRUE METRIX BLOOD GLUCOSE TEST) test strip Use as instructed to check blood sugar before meals. 300 each 12   hydrochlorothiazide (HYDRODIURIL) 25 MG tablet Take 1 tablet (25 mg total) by mouth daily. 90 tablet 3   mesalamine (LIALDA) 1.2 g EC tablet Take 2 tablets (2.4 g total) by mouth 2 (two) times daily. 360 tablet 3   metFORMIN (GLUCOPHAGE) 1000 MG tablet TAKE 1 TABLET(1000 MG) BY MOUTH TWICE DAILY WITH A MEAL 180 tablet 3   Multiple Vitamins-Minerals (CENTRUM SILVER PO) Take 1 tablet by mouth daily. Reported on 09/28/2015     Omega-3 Fatty Acids (FISH OIL) 300 MG CAPS Take 300 mg by mouth daily.     potassium chloride SA (KLOR-CON M) 20 MEQ tablet Take 1 tablet (20  mEq total) by mouth once a week. 13 tablet 3   Saline (SIMPLY SALINE) 0.9 % AERS Place 2 each into the nose as directed. Use nightly for sinus hygiene long-term.  Can also be used as many times daily as desired to assist with clearing congested sinuses. 127 mL 11   TRUEplus Lancets 33G MISC USE TO CHECK SUGAR BEFORE MEALS 3 TIMES DAILY 300 each 3   ustekinumab (STELARA) 90 MG/ML SOSY injection Inject 1syringe under the skin every 8 weeks. (Patient taking differently: 90 mg. Inject 1syringe under the skin every 4 weeks.) 1 mL 6   No current facility-administered medications for this visit.     Objective:  BP 122/82   Pulse 78   Temp (!) 97 F (36.1 C)   Ht 5\' 11"  (1.803 m)   Wt 239 lb 12.8 oz (108.8  kg)   SpO2 97%   BMI 33.45 kg/m  Gen: NAD, resting comfortably CV: RRR no murmurs rubs or gallops No chest wall pain reported today Lungs: CTAB no crackles, wheeze, rhonchi Ext: trace edema Skin: warm, dry    Assessment and Plan   # Left chest wall pain.  S: Patient was seen at urgent care on 01/07/2023 with 4-day history of intermittent left chest wall pain and mid axillary line without injury or change in activity before symptoms began.  Also was dealing with some fatigue and nausea as well as intermittent shortness of breath slightly worse with activity.  No reported sore throat, cough  EKG was obtained which showed normal sinus rhythm without ischemic changes similar to last EKG from 2020.  On chest x-ray there were concern for right lower lobe pneumonia though this was the opposite side from where he was experiencing pain.  He was given Augmentin and doxycycline -Patient did have slightly low potassium at 3.2 mmol/L.  BUN was slightly high at 25 compared to creatinine of 1.11 suggesting mild dehydration  Patient saw Dr. Sharee Pimple for regular pulmonary nodule follow-up earlier today-Dr. Work thought pneumonia was less likely and was not impressed by the opacity reported on imaging but he did say okay to finish the course of antibiotics and still come back mid-September for yearly follow-up of nodules on CT scheduled 02/20/23  He reflects back and had been active several days prior to the pain and states he just got somewhat anxious and wanted to make sure things were ok- ended up skipping church Sunday then going to urgent care on Monday. He reports may have just tweaked with activity as has now resolved for several days.   A/P: Left chest wall pain with incidental possible pneumonia on chest x-ray found by Urgent Care but on right side-almost done with full treatment. He is going of the monitor and if has new or worsening symptoms will let us know. Doubt cardiac but if he has recurrent pain or  shortness of breath or left arm or neck pain/numbness/tingling he will let us know   #hypertension S: medication: amlodipine 5 mg, hctz 25 mg BP Readings from Last 3 Encounters:  01/15/23 122/82  01/15/23 120/84  01/07/23 (!) 126/98   A/P: reasonable control per jnc 8 - continue current medications   Recommended follow up: Return for next already scheduled visit or sooner if needed. Future Appointments  Date Time Provider Department Center  02/20/2023 10:00 AM [facility] 1 [facility]  06/24/2023  8:20 AM Carlus Pavlov, MD [facility] None  07/01/2023  8:20 AM [name], MD [facility]  [facility]  07/02/2023  9:15 AM [facility] ANNUAL WELLNESS VISIT 1 [facility]   Lab/Order associations:   ICD-10-CM   1. Left-sided chest wall pain  R07.89     2. Essential hypertension  I10      No orders of the defined types were placed in this encounter.  Return precautions advised.  Tana Conch, MD

## 2023-01-15 NOTE — Patient Instructions (Signed)
My office will be contacting you by phone for referral for follow up CT chest  - if you don't hear back from my office within one week please call us back or notify us thru MyChart and we'll address it right away.   Pulmonary follow up will be as needed

## 2023-01-15 NOTE — Assessment & Plan Note (Signed)
1st detected 2017 on abd ct CT 09/25/15 Multiple bilateral pulmonary nodules, measuring up to 9 mm  - CT 04/24/16 1. Stable numerous (at least 12) solid pulmonary nodules scattered throughout both lungs measuring up to 9 mm in the right lower lobe, for which 6 month stability has been demonstrated, suggesting a benign etiology.  2. Stable apical right upper lobe 1.1 cm ground-glass pulmonary nodule. Stable medial right middle lobe ground-glass and possibly cavitary 1.0 cm pulmonary nodule. Given persistence, repeat CT is recommended every 2 years until 5 years of stability   - CT chest 04/29/2017 1. Scattered solid pulmonary nodules measure 8 mm or less in size, are unchanged from 09/21/2015 and are considered benign. 2. 10 mm ground-glass nodule in the medial segment right middle lobe, stable. Follow-up in 2 years is recommended, as clinically indicated, as a low-grade adenocarcinoma cannot be excluded   - CT 05/04/2019 1. Increased size of 9 mm ground-glass nodule in medial left upper lobe. Recommend continued follow-up by chest CT without contrast in 12 months.  - CT 05/19/2020  C/w progressive met dz > - PET 06/09/2020 Persistent bilateral solid and ground-glass pulmonary nodules show low-grade FDG uptake; low-grade carcinoma cannot be excluded.  No evidence of thoracic lymph node or distant metastatic disease.  Focal FDG uptake in the right prostate peripheral zone, which may be due to high-grade prostate carcinoma or prostatitis  >>> urology referral 06/09/2020 >>> Annabell Howells f/u  CT chest 05/2021 -Several enlarging and increasingly solid pulmonary nodules bilaterally, most notably in the right middle and lower lobes. The pre-existing solid nodules are largely stable with the largest nodule in the right lower lobe decreased in size from the previous study. The findings are indeterminate for malignancy. Recommend multi disciplinary thoracic referral for further evaluation >>>PET scan   06/01/21 1. The enlarging and increasingly solid pulmonary nodules do not demonstrate any hypermetabolic activity. While reassuring, adenocarcinoma precursor not excluded.  2. Persistent focal hypermetabolic activity in the peripheral zone of the right posterior prostate apex corresponding with a PI-RADS category 4 lesion on MRI. 3. No evidence of metastatic disease. - CT 02/07/22  Waxing and waning nodules c/w inflammatory cause  - CT mid sept 2024 rec   I don't see convincing pna and his recent symptoms are not typical of pna but this covered well on present abx and tol fine so ok to finish and come back mid sep 2024 for yearly f/u of MPNs  Discussed in detail all the  indications, usual  risks and alternatives  relative to the benefits with patient who agrees to proceed with w/u as outlined.            Each maintenance medication was reviewed in detail including emphasizing most importantly the difference between maintenance and prns and under what circumstances the prns are to be triggered using an action plan format where appropriate.  Total time for H and P, chart review, counseling, and generating customized AVS unique to this office visit / same day charting = 25 min

## 2023-01-15 NOTE — Patient Instructions (Addendum)
Let us know if you get a flu or COVID vaccine this fall.  Glad you had a good check up with Dr. Sherene Sires - I am reassured by that and glad you are feeling better- let us know if anything changes  Recommended follow up: Return for next already scheduled visit or sooner if needed.

## 2023-02-08 ENCOUNTER — Telehealth: Payer: Self-pay | Admitting: Internal Medicine

## 2023-02-08 NOTE — Telephone Encounter (Signed)
Inbound call from patient stating that 2 weeks ago he had pneumonia and was put on antibiotics and he believes it has flared his colitis. Patient is requesting a call back to discuss. Please advise.

## 2023-02-08 NOTE — Telephone Encounter (Signed)
Spoke with patient regarding MD recommendations. He will come by on Monday to pick up stool kit. Advised on when/where to go & that he will receive a call with the results once completed, and recommendations from there. Pt verbalized all understanding.

## 2023-02-08 NOTE — Telephone Encounter (Signed)
Patient called in with concerns for a colitis flare. Experiencing loose stools multiple times/day since finishing two antibiotics a couple weeks ago for treatment of pneumonia. Denies pain, bleeding, n/v. He says the same thing happened back in June after antibiotics & pain medication was given to him for a should injury. He was prescribed a prednisone taper at that time which relieved symptoms. Requesting MD recommendations. Last OV with Dr. Leone Payor 12/18/22.

## 2023-02-08 NOTE — Telephone Encounter (Signed)
Left message for patient to call back  

## 2023-02-11 ENCOUNTER — Other Ambulatory Visit: Payer: Medicare HMO

## 2023-02-11 ENCOUNTER — Other Ambulatory Visit: Payer: Self-pay

## 2023-02-11 DIAGNOSIS — R197 Diarrhea, unspecified: Secondary | ICD-10-CM

## 2023-02-11 DIAGNOSIS — Z792 Long term (current) use of antibiotics: Secondary | ICD-10-CM

## 2023-02-11 DIAGNOSIS — R109 Unspecified abdominal pain: Secondary | ICD-10-CM

## 2023-02-12 ENCOUNTER — Other Ambulatory Visit: Payer: Medicare HMO

## 2023-02-12 DIAGNOSIS — R197 Diarrhea, unspecified: Secondary | ICD-10-CM

## 2023-02-12 DIAGNOSIS — R109 Unspecified abdominal pain: Secondary | ICD-10-CM

## 2023-02-12 DIAGNOSIS — Z792 Long term (current) use of antibiotics: Secondary | ICD-10-CM | POA: Diagnosis not present

## 2023-02-14 LAB — CLOSTRIDIUM DIFFICILE BY PCR: Toxigenic C. Difficile by PCR: NEGATIVE

## 2023-02-14 NOTE — Progress Notes (Signed)
[name],  Your C diff test was negative.  Are you still having flare symptoms?  How many times are you going?  Still not having any blood, pain or nausea/vomiting?

## 2023-02-14 NOTE — Telephone Encounter (Signed)
Patient returing call. States he is constantly going back and forth to the restroom 5 x daily. Also states his symptoms are not getting any better. Please advise.  Thank you

## 2023-02-15 MED ORDER — PREDNISONE 10 MG PO TABS: ORAL_TABLET | ORAL | 0 refills | Status: AC

## 2023-02-15 NOTE — Addendum Note (Signed)
Addended by: Sharyon Medicus H on: 02/15/2023 11:26 AM   Modules accepted: Orders

## 2023-02-15 NOTE — Telephone Encounter (Signed)
Spoke with patient regarding results & MD recommendations. Prescription sent to pharmacy. Advised him that Dr. Leone Payor will return next week & if he has any further recommendations then we will be in touch. Pt verbalized all understanding.

## 2023-02-17 NOTE — Telephone Encounter (Signed)
Please make a next available f/u appt w me

## 2023-02-18 NOTE — Telephone Encounter (Signed)
Pt made aware of Dr. Leone Payor recommendations: Pt was scheduled for an office visit on 03/05/2023 at 9:10 AM. Pt made aware.  Pt verbalized understanding with all questions answered.

## 2023-02-20 ENCOUNTER — Ambulatory Visit
Admission: RE | Admit: 2023-02-20 | Discharge: 2023-02-20 | Disposition: A | Discharge status: Home / Self Care | Payer: Medicare HMO | Source: Ambulatory Visit | Attending: Internal Medicine | Admitting: Internal Medicine

## 2023-02-20 DIAGNOSIS — R918 Other nonspecific abnormal finding of lung field: Secondary | ICD-10-CM | POA: Diagnosis not present

## 2023-03-05 ENCOUNTER — Telehealth: Payer: Self-pay

## 2023-03-05 ENCOUNTER — Encounter: Payer: Self-pay | Admitting: Internal Medicine

## 2023-03-05 ENCOUNTER — Ambulatory Visit: Payer: Medicare HMO | Admitting: Internal Medicine

## 2023-03-05 ENCOUNTER — Other Ambulatory Visit: Payer: Self-pay

## 2023-03-05 VITALS — BP 122/84 | HR 88 | Ht 70.0 in | Wt 238.0 lb

## 2023-03-05 DIAGNOSIS — R918 Other nonspecific abnormal finding of lung field: Secondary | ICD-10-CM

## 2023-03-05 DIAGNOSIS — K51019 Ulcerative (chronic) pancolitis with unspecified complications: Secondary | ICD-10-CM | POA: Diagnosis not present

## 2023-03-05 DIAGNOSIS — Z7960 Long term (current) use of unspecified immunomodulators and immunosuppressants: Secondary | ICD-10-CM

## 2023-03-05 NOTE — Telephone Encounter (Signed)
I left a message for the patient to return my call.

## 2023-03-05 NOTE — Telephone Encounter (Signed)
-----   Message from Angel Frazier sent at 03/05/2023  9:02 AM EDT ----- Call patient :  Study continues to show nodules that all look benign but need to be sure so set  him up for f/u OV in 3.5  months with CT s contrast at 3 months

## 2023-03-05 NOTE — Progress Notes (Signed)
Angel Frazier 67 y.o. February 03, 1956 [id number]  Assessment & Plan:   Encounter Diagnoses  Name Primary?   Universal ulcerative colitis with complication (HCC) Yes   Long-term use of immunosuppressant medication      Recent flare in September, currently improving with 3-4 bowel movements per day of varying consistency. No current bleeding or abdominal pain. On Stelara monthly since February, but still requiring prednisone for flares. Possible association with antibiotic use triggering flares. -Continue Stelara monthly injections and mesalamine po -Finish current prednisone taper. -Check Stelara levels on October 10th, prior to next dose, to assess efficacy and guide future treatment decisions. Need to consider change in Tx but financial constraints will play a role. Rinvoq could be a good option if affordable.     Orders Placed This Encounter  Procedures   Ustekinumab and Anti-Ustek Ab   QuantiFERON-TB Gold Plus        Subjective:  Gastroenterology summary 03/14/04 colonoscopy left UC, asacol initiated No active disease on sigmoidoscopy in 2012 Maintained on mesalamine 4.8 g daily, Asacol HD and then Lialda 12/2012 - flare Tx prednisone 2015-16 - prednisone required 2017-18 prednisone 3-4 x hypersensitivity with mercaptopurine Humira initiated spring 2020 Humira stopped late 2020 due to psoriasis Late 2022, Stelara begun after colonoscopy demonstrated active left and some microscopic right colitis Patient gets assistance with mesalamine and Stelara Colonoscopy 07/09/2022-normal TI, patchy plus diffuse mild colitis seen more pronounced in left colon and biopsies in the right colon (cecum ascending and transverse were normal left colon showed active colitis Patient called with flare late February started on prednisone taper   Ustekinumab levels 0.5 08/13/22 - no Abs - moved to q 4 week dosing 12/2022 improved 02/11/23 flare - neg C diff - prednisone  taper  ------------------------------------------------------------------------------------------  Chief Complaint: ulcerative colitis f/u  HPI Discussed the use of AI scribe software for clinical note transcription with the patient, who gave verbal consent to proceed.   Angel Frazier,  presents for a follow-up visit after a call in and colitis flare in September.  C diff PCR was negative and flare was managed with prednisone taper, and the patient reports improvement in symptoms. Current bowel movements are described as varying between soft, mushy, and watery, with frequency of three to four times daily. The consistency of the bowel movements appears to be dependent on dietary intake. The patient denies any recent episodes of rectal bleeding or abdominal pain. he is still on the prednisone taper.  The patient has been on Stelara, administered every four weeks, since the beginning of the year. This change in frequency was made due to an increase in flares. The patient has had one flare since the change to monthly Stelara. The patient also reports a possible correlation between antibiotic use and flares of his ulcerative colitis, noting that symptoms seem to worsen after antibiotic therapy on 2 occasions this most recently after treatment of pneumonia in August.  LABS Potassium: 3.2 (01/2023) CBC: Normal (01/2023) C diff: Negative (02/12/2023)        Wt Readings from Last 3 Encounters:  03/05/23 238 lb (108 kg)  01/15/23 239 lb 12.8 oz (108.8 kg)  01/15/23 238 lb (108 kg)    Allergies  Allergen Reactions   Humira [Adalimumab] Rash    psoriasis   Mercaptopurine Other (See Comments)    Caused Pancreatitis   Current Meds  Medication Sig   Alcohol Swabs (B-D SINGLE USE SWABS REGULAR) PADS USE AS DIRECTED OR NEEDED   amLODipine (NORVASC) 5 MG  tablet TAKE 1 TABLET AT BEDTIME   Ascorbic Acid (VITAMIN C) 1000 MG tablet Take 1,000 mg by mouth daily. Reported on 09/28/2015   aspirin 81 MG tablet  Take 81 mg by mouth daily. Reported on 09/28/2015   atorvastatin (LIPITOR) 40 MG tablet TAKE 1 TABLET ONCE A WEEK.   Blood Glucose Calibration (TRUE METRIX LEVEL 1) Low SOLN Use as directed   blood glucose meter kit and supplies Dispense based on patient and insurance preference. Use up to four times daily as directed. (FOR ICD-9 250.00, 250.01).   Blood Glucose Monitoring Suppl (TRUE METRIX METER) w/Device KIT USE AS DIRECTED   Calcium Carbonate-Vitamin D (CALCIUM-VITAMIN D3 PO) Take 1 tablet by mouth daily. Reported on 09/28/2015   Cyanocobalamin (B-12 PO) 5000mg  daily   glucose blood (TRUE METRIX BLOOD GLUCOSE TEST) test strip Use as instructed to check blood sugar before meals.   hydrochlorothiazide (HYDRODIURIL) 25 MG tablet Take 1 tablet (25 mg total) by mouth daily.   mesalamine (LIALDA) 1.2 g EC tablet Take 2 tablets (2.4 g total) by mouth 2 (two) times daily.   metFORMIN (GLUCOPHAGE) 1000 MG tablet TAKE 1 TABLET(1000 MG) BY MOUTH TWICE DAILY WITH A MEAL   Multiple Vitamins-Minerals (CENTRUM SILVER PO) Take 1 tablet by mouth daily. Reported on 09/28/2015   Omega-3 Fatty Acids (FISH OIL) 300 MG CAPS Take 300 mg by mouth daily.   potassium chloride SA (KLOR-CON M) 20 MEQ tablet Take 1 tablet (20 mEq total) by mouth once a week.   predniSONE (DELTASONE) 10 MG tablet Take 4 tablets (40 mg total) by mouth daily with breakfast for 14 days, THEN 3 tablets (30 mg total) daily with breakfast for 7 days, THEN 2 tablets (20 mg total) daily with breakfast for 7 days, THEN 1 tablet (10 mg total) daily with breakfast for 7 days.   Saline (SIMPLY SALINE) 0.9 % AERS Place 2 each into the nose as directed. Use nightly for sinus hygiene long-term.  Can also be used as many times daily as desired to assist with clearing congested sinuses.   TRUEplus Lancets 33G MISC USE TO CHECK SUGAR BEFORE MEALS 3 TIMES DAILY   ustekinumab (STELARA) 90 MG/ML SOSY injection Inject 1syringe under the skin every 8 weeks. (Patient  taking differently: Inject 90 mg into the skin every 30 (thirty) days. Inject 1syringe under the skin every 4 weeks.)   [DISCONTINUED] amoxicillin-clavulanate (AUGMENTIN) 875-125 MG tablet Take 1 tablet by mouth every 12 (twelve) hours.   [DISCONTINUED] doxycycline (VIBRAMYCIN) 100 MG capsule Take 1 capsule (100 mg total) by mouth 2 (two) times daily.   Past Medical History:  Diagnosis Date    vitamin D deficiency 01/24/2013   01/23/13 - level 29 - supplements 50k IU weekly x 6 recommended    Anabolic steroid abuse    PT. DENIES 03/30/19   Arthritis    Avascular necrosis of femoral head (HCC)    bilateral   Bleeding hemorrhoids    Diabetes mellitus without complication (HCC) 2017   type 2   Drug-induced acute pancreatitis - 09/07/2015   Erectile dysfunction    Hypertension    Left sided ulcerative colitis (HCC)    Psoriasis caused by Humira 04/13/2019   Rhabdomyolysis 12/25/2018   Spinal stenosis of lumbar region at multiple levels    Past Surgical History:  Procedure Laterality Date   COLONOSCOPY     multiple   KNEE ARTHROSCOPY Right    PROSTATE BIOPSY  08/2020   Negative for cancer  SIGMOIDOSCOPY     Social History   Social History Narrative   Single never married. 1 son 71 in 2018. 1 grandson 18 in 2018. Lives in [place]   Lives alone      Disability- low back issues   Roll up machine operator      Hobbies: enjoys tinkering in his shed, enjoys going to the gym   family history includes Breast cancer in his sister; Diabetes in his mother and another family member; Diabetes type I in his son; Hypertension in an other family member.   Review of Systems As per HPI  Objective:   Physical Exam BP 122/84   Pulse 88   Ht 5\' 10"  (1.778 m)   Wt 238 lb (108 kg)   SpO2 98%   BMI 34.15 kg/m  Physical Exam   CHEST: lungs clear to auscultation CARDIOVASCULAR: heart sounds normal ABDOMEN: bowel sounds normal, umbilical hernia present, soft, no liver enlargement, no  tenderness

## 2023-03-05 NOTE — Patient Instructions (Signed)
Please come October 10th and have your labs drawn. No appointment needed. The lab is open 7:30am-5:30pm.   _______________________________________________________  If your blood pressure at your visit was 140/90 or greater, please contact your primary care physician to follow up on this.  _______________________________________________________  If you are age 69 or older, your body mass index should be between 23-30. Your Body mass index is 34.15 kg/m. If this is out of the aforementioned range listed, please consider follow up with your Primary Care Provider.  If you are age 10 or younger, your body mass index should be between 19-25. Your Body mass index is 34.15 kg/m. If this is out of the aformentioned range listed, please consider follow up with your Primary Care Provider.   ________________________________________________________  The [facility] providers would like to encourage you to use [facility] to communicate with providers for non-urgent requests or questions.  Due to long hold times on the telephone, sending your provider a message by [facility] may be a faster and more efficient way to get a response.  Please allow 48 business hours for a response.  Please remember that this is for non-urgent requests.  _______________________________________________________  I appreciate the opportunity to care for you. Stan Head, MD, [facility]

## 2023-03-14 ENCOUNTER — Other Ambulatory Visit: Payer: Medicare HMO

## 2023-03-14 DIAGNOSIS — Z7960 Long term (current) use of unspecified immunomodulators and immunosuppressants: Secondary | ICD-10-CM

## 2023-03-14 DIAGNOSIS — K51019 Ulcerative (chronic) pancolitis with unspecified complications: Secondary | ICD-10-CM

## 2023-03-16 LAB — QUANTIFERON-TB GOLD PLUS
Mitogen-NIL: 7.71 [IU]/mL
NIL: 0.01 [IU]/mL
QuantiFERON-TB Gold Plus: NEGATIVE
TB1-NIL: 0 [IU]/mL
TB2-NIL: 0 [IU]/mL

## 2023-03-28 ENCOUNTER — Other Ambulatory Visit (HOSPITAL_BASED_OUTPATIENT_CLINIC_OR_DEPARTMENT_OTHER): Payer: Medicare HMO

## 2023-04-01 ENCOUNTER — Other Ambulatory Visit: Payer: Self-pay | Admitting: Internal Medicine

## 2023-04-01 DIAGNOSIS — N5201 Erectile dysfunction due to arterial insufficiency: Secondary | ICD-10-CM | POA: Diagnosis not present

## 2023-04-01 DIAGNOSIS — E349 Endocrine disorder, unspecified: Secondary | ICD-10-CM | POA: Diagnosis not present

## 2023-04-03 LAB — USTEKINUMAB AND ANTI-USTEK AB
Anti-Ustekinumab Antibody: <40 ng/mL
Ustekinumab: 4.2 ug/mL

## 2023-04-11 ENCOUNTER — Telehealth: Payer: Self-pay | Admitting: Internal Medicine

## 2023-04-11 NOTE — Telephone Encounter (Signed)
Message left re: looking into financial assistance for other meds to treat UC (Rinvoq)

## 2023-04-22 MED ORDER — PREDNISONE 10 MG PO TABS: ORAL_TABLET | ORAL | 0 refills | Status: AC

## 2023-04-22 NOTE — Telephone Encounter (Signed)
Patient called back and reported that he is starting to flare again with at least 2 or 3 days of 5-6 watery stools a day making it hard to leave the house.  No bleeding.  No recent antibiotics.  C. difficile test was negative and September.  He has adequate levels of ustekinumab and no antibodies but is flaring despite that.  I am working on new medication.  Prednisone taper called in.   I will work on follow-up visit and try to see about other medications available

## 2023-04-23 NOTE — Telephone Encounter (Signed)
Patient is returning you call. And is requesting a call back. Please advise

## 2023-04-23 NOTE — Telephone Encounter (Signed)
Pt made aware of Dr. Leone Payor recommendations: Pt was  scheduled for a follow up with Dr. Leone Payor on 05/10/2023 at 1:50 PM. Pt made aware.  Pt verbalized understanding with all questions answered.

## 2023-04-23 NOTE — Telephone Encounter (Signed)
Pt scheduled for a follow up with Dr. Leone Payor on 05/10/2023 at 1:50 PM.  Left message for pt to call back

## 2023-04-23 NOTE — Telephone Encounter (Signed)
RN staff Patient needs f/u me - please use RN spot available on 12/6  Also let him know I am trying to Rx new UC med Rinvoq  Angel Frazier this will require a prior aiuth - he is failing Stelara and Lialda  Is there an assistance program for the Rinvoq and which pharmacy does it go to I have pended the Rx  Thanks

## 2023-04-23 NOTE — Telephone Encounter (Signed)
Left message for pt to call back  °

## 2023-05-10 ENCOUNTER — Encounter: Payer: Self-pay | Admitting: Internal Medicine

## 2023-05-10 ENCOUNTER — Ambulatory Visit: Payer: Medicare HMO | Admitting: Internal Medicine

## 2023-05-10 ENCOUNTER — Other Ambulatory Visit (INDEPENDENT_AMBULATORY_CARE_PROVIDER_SITE_OTHER): Payer: Medicare HMO

## 2023-05-10 VITALS — BP 140/70 | HR 108 | Ht 69.5 in | Wt 248.4 lb

## 2023-05-10 DIAGNOSIS — Z7960 Long term (current) use of unspecified immunomodulators and immunosuppressants: Secondary | ICD-10-CM

## 2023-05-10 DIAGNOSIS — R030 Elevated blood-pressure reading, without diagnosis of hypertension: Secondary | ICD-10-CM

## 2023-05-10 DIAGNOSIS — K51919 Ulcerative colitis, unspecified with unspecified complications: Secondary | ICD-10-CM | POA: Diagnosis not present

## 2023-05-10 DIAGNOSIS — K51019 Ulcerative (chronic) pancolitis with unspecified complications: Secondary | ICD-10-CM

## 2023-05-10 LAB — COMPREHENSIVE METABOLIC PANEL
ALT: 31 U/L (ref 0–53)
AST: 25 U/L (ref 0–37)
Albumin: 4.2 g/dL (ref 3.5–5.2)
Alkaline Phosphatase: 38 U/L — ABNORMAL LOW (ref 39–117)
BUN: 23 mg/dL (ref 6–23)
CO2: 26 meq/L (ref 19–32)
Calcium: 9.3 mg/dL (ref 8.4–10.5)
Chloride: 98 meq/L (ref 96–112)
Creatinine, Ser: 1 mg/dL (ref 0.40–1.50)
GFR: 77.01 mL/min (ref >60.00)
Glucose, Bld: 264 mg/dL — ABNORMAL HIGH (ref 70–99)
Potassium: 4.3 meq/L (ref 3.5–5.1)
Sodium: 137 meq/L (ref 135–145)
Total Bilirubin: 1 mg/dL (ref 0.2–1.2)
Total Protein: 7.1 g/dL (ref 6.0–8.3)

## 2023-05-10 LAB — CBC WITH DIFFERENTIAL/PLATELET
Basophils Absolute: 0 10*3/uL (ref 0.0–0.1)
Basophils Relative: 0.2 % (ref 0.0–3.0)
Eosinophils Absolute: 0 10*3/uL (ref 0.0–0.7)
Eosinophils Relative: 0.1 % (ref 0.0–5.0)
HCT: 42.1 % (ref 39.0–52.0)
Hemoglobin: 14.8 g/dL (ref 13.0–17.0)
Lymphocytes Relative: 15.1 % (ref 12.0–46.0)
Lymphs Abs: 0.7 10*3/uL (ref 0.7–4.0)
MCHC: 35.2 g/dL (ref 30.0–36.0)
MCV: 98.2 fL (ref 78.0–100.0)
Monocytes Absolute: 0.1 10*3/uL (ref 0.1–1.0)
Monocytes Relative: 2.7 % — ABNORMAL LOW (ref 3.0–12.0)
Neutro Abs: 4.1 10*3/uL (ref 1.4–7.7)
Neutrophils Relative %: 81.9 % — ABNORMAL HIGH (ref 43.0–77.0)
Platelets: 200 10*3/uL (ref 150.0–400.0)
RBC: 4.29 Mil/uL (ref 4.22–5.81)
RDW: 14 % (ref 11.5–15.5)
WBC: 5 10*3/uL (ref 4.0–10.5)

## 2023-05-10 MED ORDER — RINVOQ 15 MG PO TB24: 15.0000 mg | ORAL_TABLET | Freq: Every day | ORAL | 11 refills | Status: DC

## 2023-05-10 MED ORDER — RINVOQ 45 MG PO TB24: 45.0000 mg | ORAL_TABLET | Freq: Every day | ORAL | 1 refills | Status: DC

## 2023-05-10 NOTE — Patient Instructions (Signed)
Your provider has requested that you go to the basement level for lab work before leaving today. Press "B" on the elevator. The lab is located at the first door on the left as you exit the elevator.  Due to recent changes in healthcare laws, you may see the results of your imaging and laboratory studies on MyChart before your provider has had a chance to review them.  We understand that in some cases there may be results that are confusing or concerning to you. Not all laboratory results come back in the same time frame and the provider may be waiting for multiple results in order to interpret others.  Please give [place] 48 hours in order for your provider to thoroughly review all the results before contacting the office for clarification of your results.   We are working on getting you a new medicine-Rinvoq for you.   I appreciate the opportunity to care for you. Stan Head, MD, [facility]

## 2023-05-10 NOTE — Progress Notes (Signed)
Angel Frazier 67 y.o. 1955-10-15 [id number]  Assessment & Plan:   Encounter Diagnoses  Name Primary?   Universal ulcerative colitis with complication (HCC) Yes   Long-term use of immunosuppressant medication    Elevated blood pressure reading     Multiple flares this year despite Stelara. Last colonoscopy no disease in right colon but has had microscopic colitis there in past so mainly a left-sided colitis ? universal. Currently on prednisone with improvement in symptoms. Discussed potential side effects of Rinvoq including elevated cholesterol, abnormal liver tests, and potential for blood clots. Noted that active inflammation from colitis can also increase risk of blood clots. -Continue Stelara until further notice. -Initiate process for Rinvoq prescription and assess affordability and assistance programs. -Check labs today. CBC and CMET -Recheck blood pressure due to elevated reading today, likely stress-related.       BP recheck 140/70    Subjective:  Gastroenterology summary 03/14/04 colonoscopy left UC, asacol initiated No active disease on sigmoidoscopy in 2012 Maintained on mesalamine 4.8 g daily, Asacol HD and then Lialda 12/2012 - flare Tx prednisone 2015-16 - prednisone required 2017-18 prednisone 3-4 x hypersensitivity with mercaptopurine Humira initiated spring 2020 Humira stopped late 2020 due to psoriasis Late 2022, Stelara begun after colonoscopy demonstrated active left and some microscopic right colitis Patient gets assistance with mesalamine and Stelara Colonoscopy 07/09/2022-normal TI, patchy plus diffuse mild colitis seen more pronounced in left colon and biopsies in the right colon (cecum ascending and transverse were normal left colon showed active colitis Patient called with flare late February started on prednisone taper   Ustekinumab levels 0.5 08/13/22 - no Abs - moved to q 4 week dosing 12/2022 improved 02/11/23 flare - neg C diff - prednisone  taper   03/1023 - Ustekinumab levels 4.2 no abs monthly stelara  11/24 another flare and prednisone taper  -------------------------------------------------------------------------------------------------   Chief Complaint: f/u ulcerative colitis  HPI Discussed the use of AI scribe software for clinical note transcription with the patient, who gave verbal consent to proceed.  History of Present Illness   Angel Frazier is here for f/u of ulcerative colitis. He has experienced two flare-ups in the past three months, one in September and another in November. Despite being on Stelara, the medication does not seem to be effectively managing the colitis. The patient reports feeling better on prednisone, which was  prescribed to manage the recent flare-ups.  Angel Frazier also reports a decrease in diarrhea, with bowel movements now occurring two to three times a day, down from a higher frequency. There is no reported bleeding. There is also mention of a pneumonia episode and a shoulder issue, both of which required antibiotics. The patient believes these antibiotics may have triggered the colitis flare-ups.  The patient is also on mesalamine, also known as Lialda, for colitis.   The patient is eager to find a more effective treatment for the colitis, as they do not want to continue relying on prednisone.      Allergies  Allergen Reactions   Humira [Adalimumab] Rash    psoriasis   Mercaptopurine Other (See Comments)    Caused Pancreatitis   Current Meds  Medication Sig   Alcohol Swabs (B-D SINGLE USE SWABS REGULAR) PADS USE AS DIRECTED OR NEEDED   amLODipine (NORVASC) 5 MG tablet TAKE 1 TABLET AT BEDTIME   Ascorbic Acid (VITAMIN C) 1000 MG tablet Take 1,000 mg by mouth daily. Reported on 09/28/2015   aspirin 81 MG tablet Take 81 mg by mouth daily.  Reported on 09/28/2015   atorvastatin (LIPITOR) 40 MG tablet TAKE 1 TABLET ONCE A WEEK.   Blood Glucose Calibration (TRUE METRIX LEVEL 1) Low SOLN Use as  directed   blood glucose meter kit and supplies Dispense based on patient and insurance preference. Use up to four times daily as directed. (FOR ICD-9 250.00, 250.01).   Blood Glucose Monitoring Suppl (TRUE METRIX METER) w/Device KIT USE AS DIRECTED   Calcium Carbonate-Vitamin D (CALCIUM-VITAMIN D3 PO) Take 1 tablet by mouth daily. Reported on 09/28/2015   Cyanocobalamin (B-12 PO) 5000mg  daily   glucose blood (TRUE METRIX BLOOD GLUCOSE TEST) test strip Use as instructed to check blood sugar before meals.   hydrochlorothiazide (HYDRODIURIL) 25 MG tablet Take 1 tablet (25 mg total) by mouth daily.   LIALDA 1.2 g EC tablet TAKE TWO TABLETS BY MOUTH TWICE A DAY   metFORMIN (GLUCOPHAGE) 1000 MG tablet TAKE 1 TABLET(1000 MG) BY MOUTH TWICE DAILY WITH A MEAL   Multiple Vitamins-Minerals (CENTRUM SILVER PO) Take 1 tablet by mouth daily. Reported on 09/28/2015   Omega-3 Fatty Acids (FISH OIL) 300 MG CAPS Take 300 mg by mouth daily.   potassium chloride SA (KLOR-CON M) 20 MEQ tablet Take 1 tablet (20 mEq total) by mouth once a week.   predniSONE (DELTASONE) 10 MG tablet Take 4 tablets (40 mg total) by mouth daily with breakfast for 7 days, THEN 3 tablets (30 mg total) daily with breakfast for 7 days, THEN 2 tablets (20 mg total) daily with breakfast for 7 days, THEN 1.5 tablets (15 mg total) daily with breakfast for 7 days, THEN 1 tablet (10 mg total) daily with breakfast for 7 days, THEN 0.5 tablets (5 mg total) daily with breakfast for 7 days.   Saline (SIMPLY SALINE) 0.9 % AERS Place 2 each into the nose as directed. Use nightly for sinus hygiene long-term.  Can also be used as many times daily as desired to assist with clearing congested sinuses.   TRUEplus Lancets 33G MISC USE TO CHECK SUGAR BEFORE MEALS 3 TIMES DAILY   ustekinumab (STELARA) 90 MG/ML SOSY injection Inject 1syringe under the skin every 8 weeks.   Past Medical History:  Diagnosis Date    vitamin D deficiency 01/24/2013   01/23/13 - level 29  - supplements 50k IU weekly x 6 recommended    Anabolic steroid abuse    PT. DENIES 03/30/19   Arthritis    Avascular necrosis of femoral head (HCC)    bilateral   Bleeding hemorrhoids    Diabetes mellitus without complication (HCC) 2017   type 2   Drug-induced acute pancreatitis - 09/07/2015   Erectile dysfunction    Hypertension    Left sided ulcerative colitis (HCC)    Psoriasis caused by Humira 04/13/2019   Rhabdomyolysis 12/25/2018   Spinal stenosis of lumbar region at multiple levels    Past Surgical History:  Procedure Laterality Date   COLONOSCOPY     multiple   KNEE ARTHROSCOPY Right    PROSTATE BIOPSY  08/2020   Negative for cancer   SIGMOIDOSCOPY     Social History   Social History Narrative   Single never married. 1 son 59 in 2018. 1 grandson 62 in 2018. 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   Review of Systems As per HPI  Objective:   Physical Exam @BP  (!) 150/70 (BP Location: Left Arm, Patient Position: Sitting, Cuff Size: Large)   Pulse (!) 108   Ht 5' 9.5" (1.765 m)   Wt 248 lb 6 oz (112.7 kg)   BMI 36.15 kg/m @  General:  NAD Eyes:   anicteric Lungs:  clear Heart::  S1S2 no rubs, murmurs or gallops Abdomen:  soft and nontender, BS+ Ext:   no edema, cyanosis or clubbing    Data Reviewed:  As per HPI

## 2023-05-10 NOTE — Telephone Encounter (Signed)
Had not heard back so sent the Rx to [facility] and will wait to see what happens

## 2023-05-14 ENCOUNTER — Telehealth: Payer: Self-pay | Admitting: Internal Medicine

## 2023-05-14 NOTE — Telephone Encounter (Signed)
Patient is returning your call.  

## 2023-05-14 NOTE — Telephone Encounter (Signed)
I informed Angel Frazier of his recent lab results and he said he does check his sugars at home and he will keep an eye on them.

## 2023-05-16 ENCOUNTER — Telehealth: Payer: Self-pay | Admitting: Pharmacy Technician

## 2023-05-16 ENCOUNTER — Other Ambulatory Visit (HOSPITAL_COMMUNITY): Payer: Self-pay

## 2023-05-16 NOTE — Telephone Encounter (Signed)
Pharmacy Patient Advocate Encounter  Received notification from [facility] that Prior Authorization for [facility]MG  has been APPROVED from 12.12.24 to 12.31.25. Ran test claim, Copay is $3218.35. This test claim was processed through [facility] [facility]- copay amounts may vary at other pharmacies due to pharmacy/plan contracts, or as the patient moves through the different stages of their insurance plan.   Pt is in Coverage Gap (donut hole) with a remaining balance of [phone]. PAP is pending with [employer].

## 2023-05-16 NOTE — Telephone Encounter (Signed)
Pharmacy Patient Advocate Encounter   Received notification from Pt Calls Messages that prior authorization for [facility] 45MG  is required/requested.   Insurance verification completed.   The patient is insured through [place] .   Per test claim: PA required; PA submitted to above mentioned insurance via CoverMyMeds Key/confirmation #/EOC [id number] Status is pending

## 2023-05-16 NOTE — Telephone Encounter (Signed)
PA has been submitted, and telephone encounter has been created. Updated [employer] for Rinvoq. Benefits pending.

## 2023-05-22 ENCOUNTER — Other Ambulatory Visit: Payer: Self-pay | Admitting: Family Medicine

## 2023-06-06 ENCOUNTER — Ambulatory Visit (HOSPITAL_BASED_OUTPATIENT_CLINIC_OR_DEPARTMENT_OTHER)
Admission: RE | Admit: 2023-06-06 | Discharge: 2023-06-06 | Disposition: A | Discharge status: Home / Self Care | Payer: Medicare HMO | Source: Ambulatory Visit | Attending: Internal Medicine | Admitting: Internal Medicine

## 2023-06-06 DIAGNOSIS — R918 Other nonspecific abnormal finding of lung field: Secondary | ICD-10-CM | POA: Insufficient documentation

## 2023-06-06 DIAGNOSIS — I700 Atherosclerosis of aorta: Secondary | ICD-10-CM | POA: Diagnosis not present

## 2023-06-06 DIAGNOSIS — R9389 Abnormal findings on diagnostic imaging of other specified body structures: Secondary | ICD-10-CM | POA: Diagnosis not present

## 2023-06-06 MED ORDER — IOHEXOL 300 MG/ML  SOLN
100.0000 mL | Freq: Once | INTRAMUSCULAR | Status: AC | PRN
  Administered 2023-06-06: 80 mL via INTRAVENOUS

## 2023-06-11 ENCOUNTER — Telehealth: Payer: Self-pay | Admitting: Internal Medicine

## 2023-06-11 NOTE — Telephone Encounter (Signed)
 Inbound call from patient stating he would like for PJ to give him a call. Patient did not disclose any information regarding what it was about. Please advise.

## 2023-06-11 NOTE — Telephone Encounter (Signed)
 I called Deno back and he reports missing his dose of Stelara  last month because he told them he was waiting on new medicine. The Rinvoq  rep called him today and she is going to his house tomorrow 06/12/2023 to get some information. He is currently having 4-6 watery bowel movements a day, no blood and he wanted us  to know and he wondered about the status of his new medicine. I will route to Dr Avram and both of his RN's as they do the biologics.

## 2023-06-11 NOTE — Telephone Encounter (Signed)
 Spoke with Pt. Pt stated that the Rinvoq  representative contacted him today and stated that she was going to come to his house tomorrow to discuss his insurance and try to get him approved for the Rinvoq . Pt stated that the representative will come to his house at 11:00 AM tomorrow. Pt was notified that I will follow up with him tomorrow afternoon.  Pt verbalized understanding with all questions answered.

## 2023-06-12 NOTE — Telephone Encounter (Signed)
 Pt was contacted in regard to previous message. Pt stated that the Rinvoq  representative was currently at his house and stated that they were going to be able to help him get his medications. Pt was notified to call us  back if has any issues and any questions or concerns. Pt verbalized understanding with all questions answered.  Routed as FYI

## 2023-06-12 NOTE — Telephone Encounter (Signed)
 Appointment set up for 09/02/2023, patient aware.

## 2023-06-12 NOTE — Telephone Encounter (Signed)
 OK thanks  Please schedule Gavin to see me in March

## 2023-06-14 NOTE — Telephone Encounter (Signed)
 Received fax from [employer] that patient has been approved for Rinvoq assistance through 06/03/2024 & that they will be contacting patient to schedule first delivery.

## 2023-06-21 DIAGNOSIS — Z0100 Encounter for examination of eyes and vision without abnormal findings: Secondary | ICD-10-CM | POA: Diagnosis not present

## 2023-06-21 DIAGNOSIS — E119 Type 2 diabetes mellitus without complications: Secondary | ICD-10-CM | POA: Diagnosis not present

## 2023-06-21 LAB — HM DIABETES EYE EXAM

## 2023-06-24 ENCOUNTER — Encounter: Payer: Self-pay | Admitting: Internal Medicine

## 2023-06-24 ENCOUNTER — Ambulatory Visit: Payer: Medicare HMO | Admitting: Internal Medicine

## 2023-06-24 VITALS — BP 130/70 | HR 90 | Ht 69.5 in | Wt 265.4 lb

## 2023-06-24 DIAGNOSIS — E1169 Type 2 diabetes mellitus with other specified complication: Secondary | ICD-10-CM

## 2023-06-24 DIAGNOSIS — Z7984 Long term (current) use of oral hypoglycemic drugs: Secondary | ICD-10-CM

## 2023-06-24 DIAGNOSIS — E1165 Type 2 diabetes mellitus with hyperglycemia: Secondary | ICD-10-CM | POA: Diagnosis not present

## 2023-06-24 DIAGNOSIS — E785 Hyperlipidemia, unspecified: Secondary | ICD-10-CM

## 2023-06-24 LAB — POCT GLYCOSYLATED HEMOGLOBIN (HGB A1C): Hemoglobin A1C: 7 % — AB (ref 4.0–5.6)

## 2023-06-24 NOTE — Patient Instructions (Addendum)
Please continue: - Metformin 1000 mg 2x a day, with meals  Check some sugars 2h after eating.  Please return in 3 months.

## 2023-06-24 NOTE — Progress Notes (Signed)
Patient ID: SYD SANCEN, male   DOB: Aug 07, 1955, 68 y.o.   MRN: [id number]  HPI: Angel Frazier is a 68 y.o.-year-old male, returning for follow-up for ketosis-prone diabetes, dx in 2017, non-insulin-dependent, controlled, without long-term complications. Pt. previously saw Dr. Everardo All, but last visit with me 6 months ago.  Interim history: No increased urination, blurry vision, nausea, chest pain.  He lost 23 lbs before last OV - eating less snacks, increased exercise.  He gained 15 pounds since then. However, he gained 32 lbs since last OV!!  Reviewed HbA1c: Lab Results  Component Value Date   HGBA1C 6.2 (A) 12/17/2022   HGBA1C 6.4 (A) 06/11/2022   HGBA1C 6.7 (H) 02/12/2022   HGBA1C 6.2 (A) 08/21/2021   HGBA1C 5.6 02/20/2021   HGBA1C 6.4 (A) 08/19/2020   HGBA1C 7.1 (H) 08/09/2020   HGBA1C 6.7 (A) 02/15/2020   HGBA1C 6.1 (A) 10/12/2019   HGBA1C 6.3 (A) 06/15/2019   Pt is on a regimen of: - Metformin 1000 mg 2x a day, with meals Rybelsus was too expensive. He was on insulin after diagnosis but then was able to come off.  Pt checks his sugars 1-3x a day and they are: - am: 110-120, occas. 150-160 >> 110-115, 144 (steak) >> 100-110 >> 100-160 - 2h after b'fast: n/c - before lunch: n/c - 2h after lunch: n/c - before dinner: 110-150 >> n/c >> 140-160 - 2h after dinner: n/c - bedtime: n/c  - nighttime: n/c Lowest sugar was 95 >> 100. Highest sugar was 160 >> 144 >> 160.  Glucometer: True Metrix  He saw nutrition in the past >> decreased carb intake afterwards.  However, he relaxed his diet during the holidays 11-05/2023.  - no CKD, last BUN/creatinine:  Lab Results  Component Value Date   BUN 23 05/10/2023   BUN 25 (H) 01/07/2023   CREATININE 1.00 05/10/2023   CREATININE 1.11 01/07/2023   Lab Results  Component Value Date   MICRALBCREAT 0.6 12/28/2022   MICRALBCREAT 3.6 02/12/2022   MICRALBCREAT 3.2 12/16/2020   MICRALBCREAT 3.3 12/08/2019   MICRALBCREAT 2.8  12/04/2018   MICRALBCREAT 1.7 09/11/2017   MICRALBCREAT 30 08/30/2016  He is not on ACE inhibitor/ARB.  -+ HL; last set of lipids: Lab Results  Component Value Date   CHOL 148 12/28/2022   HDL 46.60 12/28/2022   LDLCALC 82 12/28/2022   LDLDIRECT 64.0 12/08/2019   TRIG 97.0 12/28/2022   CHOLHDL 3 12/28/2022  On Lipitor 40 mg daily, omega-3 fatty acids.  - last eye exam was in 08/2022. No DR reportedly.  - no numbness and tingling in his feet.  Last foot exam 12/17/2022.  He has a history of ulcerative colitis, psoriasis, osteoarthritis, history of pancreatitis from 6-MP, spinal stenosis, vitamin D deficiency.  ROS: + see HPI  Past Medical History:  Diagnosis Date    vitamin D deficiency 01/24/2013   01/23/13 - level 29 - supplements 50k IU weekly x 6 recommended    Anabolic steroid abuse    PT. DENIES 03/30/19   Arthritis    Avascular necrosis of femoral head (HCC)    bilateral   Bleeding hemorrhoids    Diabetes mellitus without complication (HCC) 2017   type 2   Drug-induced acute pancreatitis - 09/07/2015   Erectile dysfunction    Hypertension    Left sided ulcerative colitis (HCC)    Psoriasis caused by Humira 04/13/2019   Rhabdomyolysis 12/25/2018   Spinal stenosis of lumbar region at multiple levels  Past Surgical History:  Procedure Laterality Date   COLONOSCOPY     multiple   KNEE ARTHROSCOPY Right    PROSTATE BIOPSY  08/2020   Negative for cancer   SIGMOIDOSCOPY     Social History   Socioeconomic History   Marital status: Single    Spouse name: Not on file   Number of children: 1   Years of education: Not on file   Highest education level: Not on file  Occupational History   Occupation: retired    Associate Professor: [employer]  Tobacco Use   Smoking status: Former    Current packs/day: 0.00    Average packs/day: 1 pack/day for 6.0 years (6.0 ttl pk-yrs)    Types: Cigarettes    Start date: 06/04/1994    Quit date: 06/04/2000    Years since  quitting: 23.0   Smokeless tobacco: Never  Vaping Use   Vaping status: Never Used  Substance and Sexual Activity   Alcohol use: Not Currently    Comment: occasionally   Drug use: No   Sexual activity: Not Currently  Other Topics Concern   Not on file  Social History Narrative   Single never married. 1 son 62 in 2018. 1 grandson 37 in 2018. 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      Hobbies: enjoys tinkering in his shed, enjoys going to the gym   Social Drivers of Health   Financial Resource Strain: Low Risk  (06/25/2022)   Overall Financial Resource Strain (CARDIA)    Difficulty of Paying Living Expenses: Not hard at all  Food Insecurity: No Food Insecurity (06/25/2022)   Hunger Vital Sign    Worried About Running Out of Food in the Last Year: Never true    Ran Out of Food in the Last Year: Never true  Transportation Needs: No Transportation Needs (06/25/2022)   PRAPARE - Administrator, Civil Service (Medical): No    Lack of Transportation (Non-Medical): No  Physical Activity: Inactive (06/25/2022)   Exercise Vital Sign    Days of Exercise per Week: 0 days    Minutes of Exercise per Session: 0 min  Stress: No Stress Concern Present (06/25/2022)   [employer] of Occupational Health - Occupational Stress Questionnaire    Feeling of Stress : Not at all  Social Connections: Moderately Isolated (06/25/2022)   Social Connection and Isolation Panel [NHANES]    Frequency of Communication with Friends and Family: More than three times a week    Frequency of Social Gatherings with Friends and Family: More than three times a week    Attends Religious Services: More than 4 times per year    Active Member of [employer] or Organizations: No    Attends Banker Meetings: Never    Marital Status: Never married  Intimate Partner Violence: Not At Risk (06/25/2022)   Humiliation, Afraid, Rape, and Kick questionnaire     Fear of Current or Ex-Partner: No    Emotionally Abused: No    Physically Abused: No    Sexually Abused: No   Current Outpatient Medications on File Prior to Visit  Medication Sig Dispense Refill   Alcohol Swabs (B-D SINGLE USE SWABS REGULAR) PADS USE AS DIRECTED OR NEEDED 300 each 12   amLODipine (NORVASC) 5 MG tablet TAKE 1 TABLET AT BEDTIME 90 tablet 3   Ascorbic Acid (VITAMIN C) 1000 MG tablet  Take 1,000 mg by mouth daily. Reported on 09/28/2015     aspirin 81 MG tablet Take 81 mg by mouth daily. Reported on 09/28/2015     atorvastatin (LIPITOR) 40 MG tablet TAKE 1 TABLET ONCE A WEEK. 13 tablet 3   Blood Glucose Calibration (TRUE METRIX LEVEL 1) Low SOLN Use as directed 1 each 1   blood glucose meter kit and supplies Dispense based on patient and insurance preference. Use up to four times daily as directed. (FOR ICD-9 250.00, 250.01). 1 each 0   Blood Glucose Monitoring Suppl (TRUE METRIX METER) w/Device KIT USE AS DIRECTED 1 kit 1   Calcium Carbonate-Vitamin D (CALCIUM-VITAMIN D3 PO) Take 1 tablet by mouth daily. Reported on 09/28/2015     Cyanocobalamin (B-12 PO) 5000mg  daily     glucose blood (TRUE METRIX BLOOD GLUCOSE TEST) test strip Use as instructed to check blood sugar before meals. 300 each 12   hydrochlorothiazide (HYDRODIURIL) 25 MG tablet Take 1 tablet (25 mg total) by mouth daily. 90 tablet 3   LIALDA 1.2 g EC tablet TAKE TWO TABLETS BY MOUTH TWICE A DAY 360 tablet 2   metFORMIN (GLUCOPHAGE) 1000 MG tablet TAKE 1 TABLET(1000 MG) BY MOUTH TWICE DAILY WITH A MEAL 180 tablet 3   Multiple Vitamins-Minerals (CENTRUM SILVER PO) Take 1 tablet by mouth daily. Reported on 09/28/2015     Omega-3 Fatty Acids (FISH OIL) 300 MG CAPS Take 300 mg by mouth daily.     potassium chloride SA (KLOR-CON M) 20 MEQ tablet Take 1 tablet (20 mEq total) by mouth once a week. 13 tablet 3   Saline (SIMPLY SALINE) 0.9 % AERS Place 2 each into the nose as directed. Use nightly for sinus hygiene long-term.   Can also be used as many times daily as desired to assist with clearing congested sinuses. 127 mL 11   TRUEplus Lancets 33G MISC USE TO CHECK SUGAR BEFORE MEALS 3 TIMES DAILY 300 each 3   Upadacitinib ER (RINVOQ) 15 MG TB24 Take 1 tablet (15 mg total) by mouth daily. 30 tablet 11   Upadacitinib ER (RINVOQ) 45 MG TB24 Take 1 tablet (45 mg total) by mouth daily. 30 tablet 1   ustekinumab (STELARA) 90 MG/ML SOSY injection Inject 1syringe under the skin every 8 weeks. 1 mL 6   No current facility-administered medications on file prior to visit.   Allergies  Allergen Reactions   Humira [Adalimumab] Rash    psoriasis   Mercaptopurine Other (See Comments)    Caused Pancreatitis   Family History  Problem Relation Age of Onset   Diabetes Mother    Breast cancer Sister    Diabetes Other        cousin   Hypertension Other    Diabetes type I Son    Colon cancer Neg Hx    Esophageal cancer Neg Hx    Rectal cancer Neg Hx    Stomach cancer Neg Hx    Colon polyps Neg Hx    PE: BP 130/70   Pulse 90   Ht 5' 9.5" (1.765 m)   Wt 265 lb 6.4 oz (120.4 kg)   SpO2 96%   BMI 38.63 kg/m  Wt Readings from Last 10 Encounters:  06/24/23 265 lb 6.4 oz (120.4 kg)  05/10/23 248 lb 6 oz (112.7 kg)  03/05/23 238 lb (108 kg)  01/15/23 239 lb 12.8 oz (108.8 kg)  01/15/23 238 lb (108 kg)  12/28/22 237 lb 9.6 oz (107.8 kg)  12/18/22 238 lb (108 kg)  12/17/22 233 lb 12.8 oz (106.1 kg)  08/22/22 251 lb (113.9 kg)  07/09/22 252 lb (114.3 kg)   Constitutional: overweight, in NAD Eyes:  EOMI, no exophthalmos ENT: no neck masses, no cervical lymphadenopathy Cardiovascular: RRR, No MRG Respiratory: CTA B Musculoskeletal: no deformities Skin:no rashes Neurological: no tremor with outstretched hands  ASSESSMENT: 1.  Ketosis-prone diabetes (KPD), non-insulin-dependent, controlled, without long-term complications, but with hyperglycemia - h/o DKA - per Dr. George Hugh notes - ED -unknown if related to  diabetes.  2. HL  PLAN:  1. Patient with longstanding, uncontrolled, type 2 diabetes, on oral antidiabetic regimen with metformin only, with good control.  At last visit, HbA1c was lower than previously, at 6.2%, at goal.  Sugars remained controlled and even improved after he started to exercise consistently and cut down on his snacks.  At last visit he also lost 23 pounds since the previous visit.  We did not change his regimen.  I did advise him to check some sugars at bedtime or 2 hours after dinner. -At today's visit, he mentions that he relaxed his diet during the holidays.  Per our scale, he gained 32 pounds.  Part of it could be heavy clothing, but he still gained a significant amount of weight.  We discussed about the need to improve diet but for now, I did not suggest a change in regimen.  Sugars are slightly above target and HbA1c is higher (see below).  I plan to see him back sooner than 6 months and at that time he may need intensification of treatment if sugars are not improved then he did not lose weight. - I suggested to:  Patient Instructions  Please continue: - Metformin 1000 mg 2x a day, with meals  Check some sugars at bedtime or 2h after dinner.  Please return in 3 months.  - we checked his HbA1c: 7.0% (higher) - advised to check sugars at different times of the day - 1x a day, rotating check times - advised for yearly eye exams >> he is UTD - return to clinic in 3 months  2. HL -Reviewed latest lipid panel from 12/2020: LDL above our target of less than 70, otherwise fractions at goal: Lab Results  Component Value Date   CHOL 148 12/28/2022   HDL 46.60 12/28/2022   LDLCALC 82 12/28/2022   LDLDIRECT 64.0 12/08/2019   TRIG 97.0 12/28/2022   CHOLHDL 3 12/28/2022  -He is on Lipitor 40 mg daily and omega-3 fatty acids without side effects  Carlus Pavlov, MD PhD [facility] Endocrinology

## 2023-06-25 ENCOUNTER — Telehealth: Payer: Self-pay | Admitting: Internal Medicine

## 2023-06-25 NOTE — Telephone Encounter (Signed)
Patient checking on CT results. Patient phone number is [phone].

## 2023-07-01 ENCOUNTER — Encounter: Payer: Self-pay | Admitting: Family Medicine

## 2023-07-01 ENCOUNTER — Other Ambulatory Visit: Payer: Self-pay | Admitting: Internal Medicine

## 2023-07-01 ENCOUNTER — Ambulatory Visit (INDEPENDENT_AMBULATORY_CARE_PROVIDER_SITE_OTHER): Payer: Medicare HMO | Admitting: Family Medicine

## 2023-07-01 VITALS — BP 122/80 | HR 88 | Temp 97.3°F | Ht 69.5 in | Wt 253.0 lb

## 2023-07-01 DIAGNOSIS — E1169 Type 2 diabetes mellitus with other specified complication: Secondary | ICD-10-CM

## 2023-07-01 DIAGNOSIS — K51019 Ulcerative (chronic) pancolitis with unspecified complications: Secondary | ICD-10-CM | POA: Diagnosis not present

## 2023-07-01 DIAGNOSIS — I10 Essential (primary) hypertension: Secondary | ICD-10-CM | POA: Diagnosis not present

## 2023-07-01 DIAGNOSIS — Z7960 Long term (current) use of unspecified immunomodulators and immunosuppressants: Secondary | ICD-10-CM

## 2023-07-01 DIAGNOSIS — Z23 Encounter for immunization: Secondary | ICD-10-CM

## 2023-07-01 DIAGNOSIS — E785 Hyperlipidemia, unspecified: Secondary | ICD-10-CM

## 2023-07-01 DIAGNOSIS — Z6836 Body mass index (BMI) 36.0-36.9, adult: Secondary | ICD-10-CM | POA: Diagnosis not present

## 2023-07-01 DIAGNOSIS — Z7984 Long term (current) use of oral hypoglycemic drugs: Secondary | ICD-10-CM | POA: Diagnosis not present

## 2023-07-01 DIAGNOSIS — E1165 Type 2 diabetes mellitus with hyperglycemia: Secondary | ICD-10-CM

## 2023-07-01 NOTE — Telephone Encounter (Signed)
Pt wants to know the results of his Ct Scan that was done on 06/06/23 the results are in epic

## 2023-07-01 NOTE — Telephone Encounter (Signed)
Please advise Sir, thank you.

## 2023-07-01 NOTE — Telephone Encounter (Signed)
See CT chest result note - Amy try to call him and left message  the scan was good news

## 2023-07-01 NOTE — Progress Notes (Signed)
Phone [phone] In person visit   Subjective:   Angel Frazier is a 68 y.o. year old very pleasant male patient who presents for/with See problem oriented charting Chief Complaint  Patient presents with   Medical Management of Chronic Issues   Hypertension   Past Medical History-  Patient Active Problem List   Diagnosis Date Noted   Type 2 diabetes mellitus with hyperglycemia, without long-term current use of insulin (HCC) 12/22/2018    Priority: High   Long-term use of immunosuppressant medication Stelara 07/12/2015    Priority: High   Universal ulcerative colitis (HCC) 08/11/2008    Priority: High   Hyperlipidemia associated with type 2 diabetes mellitus (HCC) 01/20/2019    Priority: Medium    Essential hypertension 01/20/2019    Priority: Medium    Pulmonary nodules 09/07/2015    Priority: Medium    Former smoker 09/17/2019    Priority: Low   Long term current use of systemic steroids 11/18/2014    Priority: Low   History of avascular necrosis  11/18/2014    Priority: Low   Vitamin D deficiency 01/24/2013    Priority: Low   ED (erectile dysfunction) 05/21/2012    Priority: Low   Psoriasis caused by Humira 04/13/2019    Medications- reviewed and updated Current Outpatient Medications  Medication Sig Dispense Refill   Alcohol Swabs (B-D SINGLE USE SWABS REGULAR) PADS USE AS DIRECTED OR NEEDED 300 each 12   amLODipine (NORVASC) 5 MG tablet TAKE 1 TABLET AT BEDTIME 90 tablet 3   Ascorbic Acid (VITAMIN C) 1000 MG tablet Take 1,000 mg by mouth daily. Reported on 09/28/2015     aspirin 81 MG tablet Take 81 mg by mouth daily. Reported on 09/28/2015     atorvastatin (LIPITOR) 40 MG tablet TAKE 1 TABLET ONCE A WEEK. 13 tablet 3   Blood Glucose Calibration (TRUE METRIX LEVEL 1) Low SOLN Use as directed 1 each 1   blood glucose meter kit and supplies Dispense based on patient and insurance preference. Use up to four times daily as directed. (FOR ICD-9 250.00, 250.01). 1 each 0    Blood Glucose Monitoring Suppl (TRUE METRIX METER) w/Device KIT USE AS DIRECTED 1 kit 1   Calcium Carbonate-Vitamin D (CALCIUM-VITAMIN D3 PO) Take 1 tablet by mouth daily. Reported on 09/28/2015     Cyanocobalamin (B-12 PO) 5000mg  daily     glucose blood (TRUE METRIX BLOOD GLUCOSE TEST) test strip Use as instructed to check blood sugar before meals. 300 each 12   hydrochlorothiazide (HYDRODIURIL) 25 MG tablet Take 1 tablet (25 mg total) by mouth daily. 90 tablet 3   LIALDA 1.2 g EC tablet TAKE TWO TABLETS BY MOUTH TWICE A DAY 360 tablet 2   metFORMIN (GLUCOPHAGE) 1000 MG tablet TAKE 1 TABLET(1000 MG) BY MOUTH TWICE DAILY WITH A MEAL 180 tablet 3   Multiple Vitamins-Minerals (CENTRUM SILVER PO) Take 1 tablet by mouth daily. Reported on 09/28/2015     Omega-3 Fatty Acids (FISH OIL) 300 MG CAPS Take 300 mg by mouth daily.     potassium chloride SA (KLOR-CON M) 20 MEQ tablet Take 1 tablet (20 mEq total) by mouth once a week. 13 tablet 3   Saline (SIMPLY SALINE) 0.9 % AERS Place 2 each into the nose as directed. Use nightly for sinus hygiene long-term.  Can also be used as many times daily as desired to assist with clearing congested sinuses. 127 mL 11   TRUEplus Lancets 33G MISC USE TO CHECK  SUGAR BEFORE MEALS 3 TIMES DAILY 300 each 3   Upadacitinib ER (RINVOQ) 15 MG TB24 Take 1 tablet (15 mg total) by mouth daily. 30 tablet 11   Upadacitinib ER (RINVOQ) 45 MG TB24 Take 1 tablet (45 mg total) by mouth daily. 30 tablet 1   ustekinumab (STELARA) 90 MG/ML SOSY injection Inject 1syringe under the skin every 8 weeks. 1 mL 6   No current facility-administered medications for this visit.     Objective:  BP 122/80   Pulse 88   Temp (!) 97.3 F (36.3 C)   Ht 5' 9.5" (1.765 m)   Wt 253 lb (114.8 kg)   SpO2 96%   BMI 36.83 kg/m  Gen: NAD, resting comfortably CV: RRR no murmurs rubs or gallops Lungs: CTAB no crackles, wheeze, rhonchi Ext: no edema Skin: warm, dry Neuro: grossly normal, moves all  extremities      Assessment and Plan   #health maintenance - Prevnar 20 today   #hypertension S: medication: amlodipine 5 mg, hctz 25 mg -takes potassium once a week-= potassium reviewed 05/10/23 and normal range on this BP Readings from Last 3 Encounters:  07/01/23 122/80  06/24/23 130/70  05/10/23 (!) 140/70  A/P: stable- continue current medicines   #hyperlipidemia S: Medication:Atorvastatin 40 mg weekly Lab Results  Component Value Date   CHOL 148 12/28/2022   HDL 46.60 12/28/2022   LDLCALC 82 12/28/2022   LDLDIRECT 64.0 12/08/2019   TRIG 97.0 12/28/2022   CHOLHDL 3 12/28/2022  A/P:  slightly above ideal levels but reasonable control and continue current medications for now and recheck at physical -Technically with morbid obesity with BMI over 35 with hypertension, hyperlipidemia, diabetes-encouraged efforts for weight loss-prior weight loss but up 16 pounds from last visit and A1c was up as well. Indoors more with the cold- has been harder to get out.   % Diabetes-follows with endocrinology now Dr. Elvera Lennox with A1c typically under 7 S: Medication:Metformin 1000 mg twice daily-takes B12 while on this  Lab Results  Component Value Date   HGBA1C 7.0 (A) 06/24/2023   HGBA1C 6.2 (A) 12/17/2022   HGBA1C 6.4 (A) 06/11/2022  A/P: Reasonable control but trending up-just had a visit with Dr. Carrolyn Leigh current medication for now but discussed healthy eating/regular exercise  #Ulcerative colitis-follows with GI S: Medication: Lialda 2.4 g twice daily, Stelara in past but still having flares and transitioned to rinvoq - only 10 days in at 45 mg -humira lead to psoriasis A/P: hoping improvement with this change but is early yet- will monitor- continue current medications for now  -next colonoscopy likely after march visit with Dr. Leone Payor  #Ed - sees Dr. Annabell Howells for this- is going of the mention low testosterone at upcoming visit   #pulmonary nodules- monitored closely by  Dr. Sherene Sires most recently 06/06/23 with plan for 3-6 month repeat. Dr. Sherene Sires says 6 months  Recommended follow up: Return in about 6 months (around 12/29/2023) for physical or sooner if needed.Schedule b4 you leave. Future Appointments  Date Time Provider Department Center  07/03/2023  8:30 AM Nyoka Cowden, MD [facility] None  07/08/2023 12:00 PM [facility] ANNUAL WELLNESS VISIT 1 [facility]  09/02/2023  9:30 AM Iva Boop, MD [facility]  09/30/2023  9:20 AM Carlus Pavlov, MD [facility] None   Lab/Order associations:   ICD-10-CM   1. Essential hypertension  I10     2. Hyperlipidemia associated with type 2 diabetes mellitus (HCC)  E11.69    E78.5  3. Type 2 diabetes mellitus with hyperglycemia, without long-term current use of insulin (HCC)  E11.65     4. Universal ulcerative colitis with complication (HCC) Chronic K51.019     5. Obesity, morbid (HCC) Chronic E66.01     6. Need for pneumococcal 20-valent conjugate vaccination  Z23 Pneumococcal conjugate vaccine 20-valent (Prevnar 20)      No orders of the defined types were placed in this encounter.   Return precautions advised.  Tana Conch, MD

## 2023-07-01 NOTE — Patient Instructions (Addendum)
Hope the rinvoq is helpful and hopefully you are able to get moving more as the weather warms up- I'm rooting for you! That should help the a1c as well  Recommended follow up: Return in about 6 months (around 12/29/2023) for physical or sooner if needed.Schedule b4 you leave.

## 2023-07-02 NOTE — Telephone Encounter (Signed)
Angel Frazier started the Rinvoq on January 17th so today is Day 28.

## 2023-07-02 NOTE — Telephone Encounter (Signed)
I refilled it  Please find out from [place] when he started the Rinvoq - he eill need to do some labs and timing depends upon start date

## 2023-07-03 ENCOUNTER — Other Ambulatory Visit: Payer: Self-pay

## 2023-07-03 ENCOUNTER — Ambulatory Visit: Payer: Medicare HMO | Admitting: Internal Medicine

## 2023-07-03 DIAGNOSIS — N5201 Erectile dysfunction due to arterial insufficiency: Secondary | ICD-10-CM | POA: Diagnosis not present

## 2023-07-03 DIAGNOSIS — E230 Hypopituitarism: Secondary | ICD-10-CM | POA: Diagnosis not present

## 2023-07-03 MED ORDER — POTASSIUM CHLORIDE CRYS ER 20 MEQ PO TBCR: 20.0000 meq | EXTENDED_RELEASE_TABLET | ORAL | 3 refills | Status: AC

## 2023-07-03 NOTE — Telephone Encounter (Signed)
Pt was called on the 06/20/23 Left message on VM for patient to call clinic regarding CT chest results per Dr Sherene Sires.  Will leave MyChart message.  Called tried call patient 07/03/23  lvm for patient also informed patient that his result are on his my chart acct.

## 2023-07-05 NOTE — Telephone Encounter (Signed)
Angel Frazier has been informed.

## 2023-07-05 NOTE — Telephone Encounter (Signed)
Please have him do a CBC and CMEt around 2/17   Encounter Diagnoses  Name Primary?   Universal ulcerative colitis with complication (HCC) Yes   Long-term use of immunosuppressant medication    Orders Placed This Encounter  Procedures   CBC w/Diff   Comp Met (CMET)

## 2023-07-08 ENCOUNTER — Ambulatory Visit (INDEPENDENT_AMBULATORY_CARE_PROVIDER_SITE_OTHER): Payer: Medicare HMO

## 2023-07-08 VITALS — BP 128/82 | Temp 98.2°F | Wt 255.8 lb

## 2023-07-08 DIAGNOSIS — Z0000 Encounter for general adult medical examination without abnormal findings: Secondary | ICD-10-CM

## 2023-07-08 DIAGNOSIS — Z1211 Encounter for screening for malignant neoplasm of colon: Secondary | ICD-10-CM

## 2023-07-08 NOTE — Patient Instructions (Signed)
[name] , Thank you for taking time to come for your Medicare Wellness Visit. I appreciate your ongoing commitment to your health goals. Please review the following plan we discussed and let me know if I can assist you in the future.   Referrals/Orders/Follow-Ups/Clinician Recommendations: Aim for 30 minutes of exercise or brisk walking, 6-8 glasses of water, and 5 servings of fruits and vegetables each day. Continue to work on losing weight   This is a list of the screening recommended for you and due dates:  Health Maintenance  Topic Date Due   Medicare Annual Wellness Visit  06/26/2023   Colon Cancer Screening  07/10/2023   COVID-19 Vaccine (6 - 2024-25 season) 07/17/2023*   Flu Shot  09/02/2023*   Complete foot exam   12/17/2023   Hemoglobin A1C  12/22/2023   Yearly kidney health urinalysis for diabetes  12/28/2023   Yearly kidney function blood test for diabetes  05/09/2024   Eye exam for diabetics  06/20/2024   DTaP/Tdap/Td vaccine (4 - Td or Tdap) 03/10/2031   Pneumonia Vaccine  Completed   Hepatitis C Screening  Completed   Zoster (Shingles) Vaccine  Completed   HPV Vaccine  Aged Out  *Topic was postponed. The date shown is not the original due date.    Advanced directives: (Copy Requested) Please bring a copy of your health care power of attorney and living will to the office to be added to your chart at your convenience.  Next Medicare Annual Wellness Visit scheduled for next year: Yes

## 2023-07-08 NOTE — Progress Notes (Signed)
Subjective:   Angel Frazier is a 68 y.o. male who presents for Medicare Annual/Subsequent preventive examination.  Visit Complete: In person   Cardiac Risk Factors include: advanced age (>43men, >34 women)     Objective:    Today's Vitals   07/08/23 0955  BP: 128/82  Temp: 98.2 F (36.8 C)  SpO2: 94%  Weight: 255 lb 12.8 oz (116 kg)   Body mass index is 37.23 kg/m.     07/08/2023   10:00 AM 06/25/2022    9:26 AM 06/12/2021    9:38 AM 06/09/2020    9:41 AM 04/16/2019    4:45 PM 06/18/2017    9:58 AM 09/13/2015   12:24 PM  Advanced Directives  Does Patient Have a Medical Advance Directive? Yes No No Yes No No No  Type of Estate agent of [place];Living will        Does patient want to make changes to medical advance directive?    Yes (MAU/Ambulatory/Procedural Areas - Information given)     Copy of Healthcare Power of Attorney in Chart? No - copy requested        Would patient like information on creating a medical advance directive?  Yes (MAU/Ambulatory/Procedural Areas - Information given) Yes (MAU/Ambulatory/Procedural Areas - Information given)  Yes (MAU/Ambulatory/Procedural Areas - Information given) Yes (MAU/Ambulatory/Procedural Areas - Information given) Yes - Educational materials given    Current Medications (verified) Outpatient Encounter Medications as of 07/08/2023  Medication Sig   Alcohol Swabs (B-D SINGLE USE SWABS REGULAR) PADS USE AS DIRECTED OR NEEDED   amLODipine (NORVASC) 5 MG tablet TAKE 1 TABLET AT BEDTIME   Ascorbic Acid (VITAMIN C) 1000 MG tablet Take 1,000 mg by mouth daily. Reported on 09/28/2015   aspirin 81 MG tablet Take 81 mg by mouth daily. Reported on 09/28/2015   atorvastatin (LIPITOR) 40 MG tablet TAKE 1 TABLET ONCE A WEEK.   Blood Glucose Calibration (TRUE METRIX LEVEL 1) Low SOLN Use as directed   blood glucose meter kit and supplies Dispense based on patient and insurance preference. Use up to four times daily as  directed. (FOR ICD-9 250.00, 250.01).   Blood Glucose Monitoring Suppl (TRUE METRIX METER) w/Device KIT USE AS DIRECTED   Calcium Carbonate-Vitamin D (CALCIUM-VITAMIN D3 PO) Take 1 tablet by mouth daily. Reported on 09/28/2015   Cyanocobalamin (B-12 PO) 5000mg  daily   glucose blood (TRUE METRIX BLOOD GLUCOSE TEST) test strip Use as instructed to check blood sugar before meals.   hydrochlorothiazide (HYDRODIURIL) 25 MG tablet Take 1 tablet (25 mg total) by mouth daily.   LIALDA 1.2 g EC tablet TAKE TWO TABLETS BY MOUTH TWICE A DAY   metFORMIN (GLUCOPHAGE) 1000 MG tablet TAKE 1 TABLET(1000 MG) BY MOUTH TWICE DAILY WITH A MEAL   Multiple Vitamins-Minerals (CENTRUM SILVER PO) Take 1 tablet by mouth daily. Reported on 09/28/2015   Omega-3 Fatty Acids (FISH OIL) 300 MG CAPS Take 300 mg by mouth daily.   potassium chloride SA (KLOR-CON M) 20 MEQ tablet Take 1 tablet (20 mEq total) by mouth once a week.   Saline (SIMPLY SALINE) 0.9 % AERS Place 2 each into the nose as directed. Use nightly for sinus hygiene long-term.  Can also be used as many times daily as desired to assist with clearing congested sinuses.   TRUEplus Lancets 33G MISC USE TO CHECK SUGAR BEFORE MEALS 3 TIMES DAILY   Upadacitinib ER (RINVOQ) 45 MG TB24 Take 1 tablet (45 mg total) by mouth daily.  Upadacitinib ER (RINVOQ) 15 MG TB24 Take 1 tablet (15 mg total) by mouth daily. (Patient not taking: Reported on 07/08/2023)   ustekinumab (STELARA) 90 MG/ML SOSY injection Inject 1syringe under the skin every 8 weeks. (Patient not taking: Reported on 07/08/2023)   No facility-administered encounter medications on file as of 07/08/2023.    Allergies (verified) Humira [adalimumab] and Mercaptopurine   History: Past Medical History:  Diagnosis Date    vitamin D deficiency 01/24/2013   01/23/13 - level 29 - supplements 50k IU weekly x 6 recommended    Anabolic steroid abuse    PT. DENIES 03/30/19   Arthritis    Avascular necrosis of femoral head  (HCC)    bilateral   Bleeding hemorrhoids    Diabetes mellitus without complication (HCC) 2017   type 2   Drug-induced acute pancreatitis - 09/07/2015   Erectile dysfunction    Hypertension    Left sided ulcerative colitis (HCC)    Psoriasis caused by Humira 04/13/2019   Rhabdomyolysis 12/25/2018   Spinal stenosis of lumbar region at multiple levels    Past Surgical History:  Procedure Laterality Date   COLONOSCOPY     multiple   KNEE ARTHROSCOPY Right    PROSTATE BIOPSY  08/2020   Negative for cancer   SIGMOIDOSCOPY     Family History  Problem Relation Age of Onset   Diabetes Mother    Breast cancer Sister    Diabetes Other        cousin   Hypertension Other    Diabetes type I Son    Colon cancer Neg Hx    Esophageal cancer Neg Hx    Rectal cancer Neg Hx    Stomach cancer Neg Hx    Colon polyps Neg Hx    Social History   Socioeconomic History   Marital status: Single    Spouse name: Not on file   Number of children: 1   Years of education: Not on file   Highest education level: Not on file  Occupational History   Occupation: retired    Associate Professor: [employer]  Tobacco Use   Smoking status: Former    Current packs/day: 0.00    Average packs/day: 1 pack/day for 6.0 years (6.0 ttl pk-yrs)    Types: Cigarettes    Start date: 06/04/1994    Quit date: 06/04/2000    Years since quitting: 23.1   Smokeless tobacco: Never  Vaping Use   Vaping status: Never Used  Substance and Sexual Activity   Alcohol use: Not Currently    Comment: occasionally   Drug use: No   Sexual activity: Not Currently  Other Topics Concern   Not on file  Social History Narrative   Single never married. 1 son 75 in 2018. 1 grandson 33 in 2018. 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      Hobbies: enjoys tinkering in his shed, enjoys going to the gym   Social Drivers of Health   Financial Resource Strain: Low Risk   (07/08/2023)   Overall Financial Resource Strain (CARDIA)    Difficulty of Paying Living Expenses: Not hard at all  Food Insecurity: No Food Insecurity (07/08/2023)   Hunger Vital Sign    Worried About Running Out of Food in the Last Year: Never true    Ran Out of Food in the Last Year: Never true  Transportation Needs: No  Transportation Needs (07/08/2023)   PRAPARE - Administrator, Civil Service (Medical): No    Lack of Transportation (Non-Medical): No  Physical Activity: Sufficiently Active (07/08/2023)   Exercise Vital Sign    Days of Exercise per Week: 4 days    Minutes of Exercise per Session: 50 min  Stress: No Stress Concern Present (07/08/2023)   [employer] of Occupational Health - Occupational Stress Questionnaire    Feeling of Stress : Not at all  Social Connections: Moderately Isolated (07/08/2023)   Social Connection and Isolation Panel [NHANES]    Frequency of Communication with Friends and Family: More than three times a week    Frequency of Social Gatherings with Friends and Family: More than three times a week    Attends Religious Services: More than 4 times per year    Active Member of [employer] or Organizations: No    Attends Engineer, structural: Never    Marital Status: Never married    Tobacco Counseling Counseling given: Not Answered   Clinical Intake:  Pre-visit preparation completed: Yes  Pain : No/denies pain     BMI - recorded: 37.23 Nutritional Status: BMI > 30  Obese Nutritional Risks: None Diabetes: Yes CBG done?: Yes (140 PER PT) CBG resulted in Enter/ Edit results?: No Did pt. bring in CBG monitor from home?: No  How often do you need to have someone help you when you read instructions, pamphlets, or other written materials from your doctor or pharmacy?: 1 - Never  Interpreter Needed?: No  Information entered by :: Lanier Ensign, LPN   Activities of Daily Living    07/08/2023    9:57 AM  In your present state of  health, do you have any difficulty performing the following activities:  Hearing? 0  Vision? 0  Difficulty concentrating or making decisions? 0  Walking or climbing stairs? 0  Dressing or bathing? 0  Doing errands, shopping? 0  Preparing Food and eating ? N  Using the Toilet? N  In the past six months, have you accidently leaked urine? N  Do you have problems with loss of bowel control? N  Managing your Medications? N  Managing your Finances? N  Housekeeping or managing your Housekeeping? N    Patient Care Team: [name], MD as PCP - General (Family Medicine) Romero Belling, MD (Inactive) as Consulting Physician (Endocrinology) Nyoka Cowden, MD as Consulting Physician (Pulmonary Disease) Iva Boop, MD as Consulting Physician (Gastroenterology) Erroll Luna, [facility] (Inactive) (Pharmacist)  Indicate any recent Medical Services you may have received from other than [facility] providers in the past year (date may be approximate).     Assessment:   This is a routine wellness examination for Alexius.  Hearing/Vision screen Hearing Screening - Comments:: Pt denies any hearing issues  Vision Screening - Comments:: [facility]    Goals Addressed             This Visit's Progress    Patient Stated       Lose weight        Depression Screen    07/08/2023   10:01 AM 07/01/2023    8:24 AM 07/05/2022    9:01 AM 06/25/2022    9:25 AM 06/12/2021    9:36 AM 12/16/2020    9:21 AM 08/09/2020    8:01 AM  PHQ 2/9 Scores  PHQ - 2 Score 0 0 0 0 0 0 0  PHQ- 9 Score 0 0 0        Fall Risk    07/08/2023   10:03 AM 07/01/2023    8:24 AM 07/05/2022    9:01 AM 06/25/2022    9:28 AM 06/12/2021    9:39 AM  Fall Risk   Falls in the past year? 0 0 0 0 0  Number falls in past yr: 0 0 0 0 0  Injury with Fall? 0 0 0 0 0  Risk for fall due to : No Fall Risks No Fall Risks No Fall Risks Impaired vision Impaired vision  Follow up Falls prevention  discussed Falls evaluation completed Falls evaluation completed Falls prevention discussed Falls prevention discussed    MEDICARE RISK AT HOME: Medicare Risk at Home Any stairs in or around the home?: No If so, are there any without handrails?: No Home free of loose throw rugs in walkways, pet beds, electrical cords, etc?: Yes Adequate lighting in your home to reduce risk of falls?: Yes Life alert?: No Use of a cane, walker or w/c?: No Grab bars in the bathroom?: No Shower chair or bench in shower?: No Elevated toilet seat or a handicapped toilet?: No  TIMED UP AND GO:  Was the test performed?  Yes  Length of time to ambulate 10 feet: 10 sec Gait steady and fast without use of assistive device    Cognitive Function:        07/08/2023   10:04 AM 06/25/2022    9:28 AM 06/12/2021    9:50 AM 06/09/2020    9:45 AM 04/16/2019    4:45 PM  6CIT Screen  What Year? 0 points 0 points 0 points 0 points 0 points  What month? 0 points 0 points 0 points 0 points 0 points  What time? 0 points 0 points 0 points  0 points  Count back from 20 0 points 0 points 0 points 0 points 0 points  Months in reverse 0 points 2 points 0 points 0 points 0 points  Repeat phrase 0 points 0 points 4 points 4 points 0 points  Total Score 0 points 2 points 4 points  0 points    Immunizations Immunization History  Administered Date(s) Administered   Fluad Quad(high Dose 65+) 03/21/2020, 03/04/2021   Influenza Split 02/15/2011   Influenza, Seasonal, Injecte, Preservative Fre 05/21/2012   Influenza,inj,Quad PF,6+ Mos 06/03/2013   Moderna Covid-19 Vaccine Bivalent Booster 14yrs & up 04/19/2021   Moderna SARS-COV2 Booster Vaccination 11/16/2020   Moderna Sars-Covid-2 Vaccination 08/24/2019, 09/21/2019, 04/04/2020   PNEUMOCOCCAL CONJUGATE-20 07/01/2023   Td 06/04/2001   Tdap 05/21/2012, 03/09/2021   Zoster Recombinant(Shingrix) 04/25/2021, 06/28/2021    TDAP status: Up to date  Flu Vaccine status: Due,  Education has been provided regarding the importance of this vaccine. Advised may receive this vaccine at local pharmacy or Health Dept. Aware to provide a copy of the vaccination record if obtained from local pharmacy or Health Dept. Verbalized acceptance and understanding.  Pneumococcal vaccine status: Up to date  Covid-19 vaccine status: Information provided on how to obtain vaccines.   Qualifies for Shingles Vaccine? Yes   Zostavax completed Yes   Shingrix Completed?: Yes  Screening Tests Health Maintenance  Topic Date Due   Colonoscopy  07/10/2023   COVID-19 Vaccine (6 - 2024-25 season) 07/17/2023 (Originally 02/03/2023)   INFLUENZA VACCINE  09/02/2023 (Originally 01/03/2023)   FOOT EXAM  12/17/2023   HEMOGLOBIN A1C  12/22/2023   Diabetic kidney evaluation - Urine ACR  12/28/2023   Diabetic kidney evaluation - eGFR measurement  05/09/2024   OPHTHALMOLOGY EXAM  06/20/2024   Medicare Annual Wellness (AWV)  07/07/2024   DTaP/Tdap/Td (4 - Td or Tdap) 03/10/2031   Pneumonia Vaccine 35+ Years old  Completed   Hepatitis C Screening  Completed   Zoster Vaccines- Shingrix  Completed   HPV VACCINES  Aged Out    Health Maintenance  Health Maintenance Due  Topic Date Due   Colonoscopy  07/10/2023    Colorectal cancer screening: Referral to GI placed 07/08/23. Pt aware the office will call re: appt.   Additional Screening:  Hepatitis C Screening:  Completed 09/07/16  Vision Screening: Recommended annual ophthalmology exams for early detection of glaucoma and other disorders of the eye. Is the patient up to date with their annual eye exam?  Yes  Who is the provider or what is the name of the office in which the patient attends annual eye exams? [facility] If pt is not established with a provider, would they like to be referred to a provider to establish care? No .   Dental Screening: Recommended annual dental exams for proper oral hygiene  Diabetic Foot Exam: Diabetic Foot Exam:  Completed 12/17/22  Community Resource Referral / Chronic Care Management: CRR required this visit?  No   CCM required this visit?  No     Plan:     I have personally reviewed and noted the following in the patient's chart:   Medical and social history Use of alcohol, tobacco or illicit drugs  Current medications and supplements including opioid prescriptions. Patient is not currently taking opioid prescriptions. Functional ability and status Nutritional status Physical activity Advanced directives List of other physicians Hospitalizations, surgeries, and ER visits in previous 12 months Vitals Screenings to include cognitive, depression, and falls Referrals and appointments  In addition, I have reviewed and discussed with patient certain preventive protocols, quality metrics, and best practice recommendations. A written personalized care plan for preventive services as well as general preventive health recommendations were provided to patient.     Marzella Schlein, LPN   0/06/930   After Visit Summary: (In Person-Printed) AVS printed and given to the patient  Nurse Notes: None

## 2023-07-17 ENCOUNTER — Encounter: Payer: Self-pay | Admitting: Internal Medicine

## 2023-07-22 ENCOUNTER — Other Ambulatory Visit (INDEPENDENT_AMBULATORY_CARE_PROVIDER_SITE_OTHER): Payer: Medicare HMO

## 2023-07-22 DIAGNOSIS — Z7960 Long term (current) use of unspecified immunomodulators and immunosuppressants: Secondary | ICD-10-CM

## 2023-07-22 DIAGNOSIS — K51019 Ulcerative (chronic) pancolitis with unspecified complications: Secondary | ICD-10-CM | POA: Diagnosis not present

## 2023-07-22 LAB — CBC WITH DIFFERENTIAL/PLATELET
Basophils Absolute: 0 10*3/uL (ref 0.0–0.1)
Basophils Relative: 0.1 % (ref 0.0–3.0)
Eosinophils Absolute: 0 10*3/uL (ref 0.0–0.7)
Eosinophils Relative: 0.4 % (ref 0.0–5.0)
HCT: 38.4 % — ABNORMAL LOW (ref 39.0–52.0)
Hemoglobin: 13.3 g/dL (ref 13.0–17.0)
Lymphocytes Relative: 57.4 % — ABNORMAL HIGH (ref 12.0–46.0)
Lymphs Abs: 3.1 10*3/uL (ref 0.7–4.0)
MCHC: 34.6 g/dL (ref 30.0–36.0)
MCV: 95.2 fL (ref 78.0–100.0)
Monocytes Absolute: 0.6 10*3/uL (ref 0.1–1.0)
Monocytes Relative: 11.2 % (ref 3.0–12.0)
Neutro Abs: 1.7 10*3/uL (ref 1.4–7.7)
Neutrophils Relative %: 30.9 % — ABNORMAL LOW (ref 43.0–77.0)
Platelets: 229 10*3/uL (ref 150.0–400.0)
RBC: 4.03 Mil/uL — ABNORMAL LOW (ref 4.22–5.81)
RDW: 13 % (ref 11.5–15.5)
WBC: 5.5 10*3/uL (ref 4.0–10.5)

## 2023-07-22 LAB — COMPREHENSIVE METABOLIC PANEL
ALT: 41 U/L (ref 0–53)
AST: 51 U/L — ABNORMAL HIGH (ref 0–37)
Albumin: 4.6 g/dL (ref 3.5–5.2)
Alkaline Phosphatase: 27 U/L — ABNORMAL LOW (ref 39–117)
BUN: 13 mg/dL (ref 6–23)
CO2: 31 meq/L (ref 19–32)
Calcium: 9.9 mg/dL (ref 8.4–10.5)
Chloride: 101 meq/L (ref 96–112)
Creatinine, Ser: 0.96 mg/dL (ref 0.40–1.50)
GFR: 80.76 mL/min (ref >60.00)
Glucose, Bld: 100 mg/dL — ABNORMAL HIGH (ref 70–99)
Potassium: 3.7 meq/L (ref 3.5–5.1)
Sodium: 142 meq/L (ref 135–145)
Total Bilirubin: 0.8 mg/dL (ref 0.2–1.2)
Total Protein: 7.3 g/dL (ref 6.0–8.3)

## 2023-07-23 ENCOUNTER — Other Ambulatory Visit: Payer: Self-pay | Admitting: Internal Medicine

## 2023-07-23 DIAGNOSIS — K5100 Ulcerative (chronic) pancolitis without complications: Secondary | ICD-10-CM

## 2023-08-03 ENCOUNTER — Other Ambulatory Visit: Payer: Self-pay | Admitting: Family Medicine

## 2023-08-19 ENCOUNTER — Other Ambulatory Visit (INDEPENDENT_AMBULATORY_CARE_PROVIDER_SITE_OTHER)

## 2023-08-19 ENCOUNTER — Encounter: Payer: Self-pay | Admitting: Internal Medicine

## 2023-08-19 DIAGNOSIS — K5100 Ulcerative (chronic) pancolitis without complications: Secondary | ICD-10-CM | POA: Diagnosis not present

## 2023-08-19 LAB — CBC WITH DIFFERENTIAL/PLATELET
Basophils Absolute: 0 10*3/uL (ref 0.0–0.1)
Basophils Relative: 0.1 % (ref 0.0–3.0)
Eosinophils Absolute: 0 10*3/uL (ref 0.0–0.7)
Eosinophils Relative: 0.2 % (ref 0.0–5.0)
HCT: 38.4 % — ABNORMAL LOW (ref 39.0–52.0)
Hemoglobin: 13.3 g/dL (ref 13.0–17.0)
Lymphocytes Relative: 34.3 % (ref 12.0–46.0)
Lymphs Abs: 1.7 10*3/uL (ref 0.7–4.0)
MCHC: 34.7 g/dL (ref 30.0–36.0)
MCV: 95 fl (ref 78.0–100.0)
Monocytes Absolute: 0.6 10*3/uL (ref 0.1–1.0)
Monocytes Relative: 12.4 % — ABNORMAL HIGH (ref 3.0–12.0)
Neutro Abs: 2.6 10*3/uL (ref 1.4–7.7)
Neutrophils Relative %: 53 % (ref 43.0–77.0)
Platelets: 220 10*3/uL (ref 150.0–400.0)
RBC: 4.04 Mil/uL — ABNORMAL LOW (ref 4.22–5.81)
RDW: 13.5 % (ref 11.5–15.5)
WBC: 4.9 10*3/uL (ref 4.0–10.5)

## 2023-08-19 LAB — COMPREHENSIVE METABOLIC PANEL
ALT: 50 U/L (ref 0–53)
AST: 53 U/L — ABNORMAL HIGH (ref 0–37)
Albumin: 4.6 g/dL (ref 3.5–5.2)
Alkaline Phosphatase: 24 U/L — ABNORMAL LOW (ref 39–117)
BUN: 17 mg/dL (ref 6–23)
CO2: 28 meq/L (ref 19–32)
Calcium: 9.8 mg/dL (ref 8.4–10.5)
Chloride: 101 meq/L (ref 96–112)
Creatinine, Ser: 1.06 mg/dL (ref 0.40–1.50)
GFR: 71.67 mL/min (ref >60.00)
Glucose, Bld: 124 mg/dL — ABNORMAL HIGH (ref 70–99)
Potassium: 3.7 meq/L (ref 3.5–5.1)
Sodium: 139 meq/L (ref 135–145)
Total Bilirubin: 0.8 mg/dL (ref 0.2–1.2)
Total Protein: 7.2 g/dL (ref 6.0–8.3)

## 2023-09-02 ENCOUNTER — Telehealth: Payer: Self-pay | Admitting: Internal Medicine

## 2023-09-02 ENCOUNTER — Encounter: Payer: Self-pay | Admitting: Internal Medicine

## 2023-09-02 ENCOUNTER — Ambulatory Visit: Payer: Medicare HMO | Admitting: Internal Medicine

## 2023-09-02 ENCOUNTER — Other Ambulatory Visit: Payer: Self-pay

## 2023-09-02 VITALS — BP 120/78 | HR 66 | Ht 69.5 in | Wt 257.0 lb

## 2023-09-02 DIAGNOSIS — R748 Abnormal levels of other serum enzymes: Secondary | ICD-10-CM

## 2023-09-02 DIAGNOSIS — K51919 Ulcerative colitis, unspecified with unspecified complications: Secondary | ICD-10-CM | POA: Diagnosis not present

## 2023-09-02 DIAGNOSIS — Z7960 Long term (current) use of unspecified immunomodulators and immunosuppressants: Secondary | ICD-10-CM

## 2023-09-02 DIAGNOSIS — K51019 Ulcerative (chronic) pancolitis with unspecified complications: Secondary | ICD-10-CM

## 2023-09-02 NOTE — Progress Notes (Signed)
 Angel Frazier 68 y.o. 1956/05/02 [id number]  Assessment & Plan:   Encounter Diagnoses  Name Primary?   Universal ulcerative colitis with complication (HCC) Yes   Long-term use of immunosuppressant medication    Abnormal transaminases     He is improved though not a great clinical response so far.  Continue with Rinvoq 15 mg daily.  Depending upon clinical course may need to move that to 30 mg daily.  Continue mesalamine as well for now.  Treat with Imodium A-D to twice daily   Fecal calprotectin level is checked today.  Not checking CRP as that has not risen when he has been in flares before.  He needs a lipid panel in the next few months given initiation of Rinvoq as it may raise lipid levels.  We will sort that out after the calprotectin returns.  Will monitor his LFTs also.  Transaminases may rise from Rinvoq though he is at risk of fatty liver also.  Follow-up plans for June 2025 sooner as needed  Anticipated colonoscopy later this year.  CC: [name], MD   Subjective:  Gastroenterology summary 03/14/04 colonoscopy left UC, asacol initiated No active disease on sigmoidoscopy in 2012 Maintained on mesalamine 4.8 g daily, Asacol HD and then Lialda 12/2012 - flare Tx prednisone 2015-16 - prednisone required 2017-18 prednisone 3-4 x hypersensitivity with mercaptopurine Humira initiated spring 2020 Humira stopped late 2020 due to psoriasis Late 2022, Stelara begun after colonoscopy demonstrated active left and some microscopic right colitis Patient gets assistance with mesalamine and Stelara Colonoscopy 07/09/2022-normal TI, patchy plus diffuse mild colitis seen more pronounced in left colon and biopsies in the right colon (cecum ascending and transverse were normal left colon showed active colitis Patient called with flare late February started on prednisone taper   Ustekinumab levels 0.5 08/13/22 - no Abs - moved to q 4 week dosing 12/2022 improved 02/11/23 flare -  neg C diff - prednisone taper   03/1023 - Ustekinumab levels 4.2 no abs monthly stelara   11/24 another flare and prednisone taper  January 2025 Rinvoq started 45 mg daily x 8 weeks then 15 mg daily -------------------------------------------------------------------------------------------- Patient consented to the use of artificial intelligence scribe function  Chief Complaint: Follow-up of ulcerative colitis after Rinvoq initiation  HPI 68 year old man with  ulcerative colitis and diabetes mellitus, here for follow-up after initiation of Rinvoq.  He was on Stelara every 4 weeks but was requiring prednisone on more than 1 occasion.  He is about 15 days into his 15 mg maintenance dose.  That was after taking 8 weeks of 45 mg.Since starting Rinvoq, he has experienced 'some good days and bad days' with bowel movements ranging from mushy to watery stools occurring three to five times daily. Prior to starting Rinvoq, he experienced more frequent bowel movements, approximately six to seven times daily, indicating a slight improvement in frequency.  No abdominal pain or bleeding. He continues to take Lialda as part of his treatment regimen and has not used Imodium or similar medications to manage diarrhea. He has minor abnormalities in his blood work, specifically a slight elevation in AST levels since starting Rinvoq, but he does not report any muscle aches or other symptoms associated with this finding.  Lab Results  Component Value Date   ALT 50 08/19/2023   AST 53 (H) 08/19/2023   ALKPHOS 24 (L) 08/19/2023   BILITOT 0.8 08/19/2023   Lab Results  Component Value Date   WBC 4.9 08/19/2023  HGB 13.3 08/19/2023   HCT 38.4 (L) 08/19/2023   MCV 95.0 08/19/2023   PLT 220.0 08/19/2023   Lab Results  Component Value Date   NA 139 08/19/2023   CL 101 08/19/2023   K 3.7 08/19/2023   CO2 28 08/19/2023   BUN 17 08/19/2023   CREATININE 1.06 08/19/2023   GFR 71.67 08/19/2023   CALCIUM 9.8  08/19/2023   ALBUMIN 4.6 08/19/2023   GLUCOSE 124 (H) 08/19/2023    Allergies  Allergen Reactions   Humira [Adalimumab] Rash    psoriasis   Mercaptopurine Other (See Comments)    Caused Pancreatitis   Current Meds  Medication Sig   Alcohol Swabs (B-D SINGLE USE SWABS REGULAR) PADS USE AS DIRECTED OR NEEDED   amLODipine (NORVASC) 5 MG tablet TAKE 1 TABLET AT BEDTIME   Ascorbic Acid (VITAMIN C) 1000 MG tablet Take 1,000 mg by mouth daily. Reported on 09/28/2015   aspirin 81 MG tablet Take 81 mg by mouth daily. Reported on 09/28/2015   atorvastatin (LIPITOR) 40 MG tablet TAKE 1 TABLET ONCE A WEEK.   Blood Glucose Calibration (TRUE METRIX LEVEL 1) Low SOLN Use as directed   blood glucose meter kit and supplies Dispense based on patient and insurance preference. Use up to four times daily as directed. (FOR ICD-9 250.00, 250.01).   Blood Glucose Monitoring Suppl (TRUE METRIX METER) w/Device KIT USE AS DIRECTED   Calcium Carbonate-Vitamin D (CALCIUM-VITAMIN D3 PO) Take 1 tablet by mouth daily. Reported on 09/28/2015   Cyanocobalamin (B-12 PO) 5000mg  daily   glucose blood (TRUE METRIX BLOOD GLUCOSE TEST) test strip Use as instructed to check blood sugar before meals.   hydrochlorothiazide (HYDRODIURIL) 25 MG tablet Take 1 tablet (25 mg total) by mouth daily.   LIALDA 1.2 g EC tablet TAKE TWO TABLETS BY MOUTH TWICE A DAY   metFORMIN (GLUCOPHAGE) 1000 MG tablet TAKE 1 TABLET(1000 MG) BY MOUTH TWICE DAILY WITH A MEAL   Multiple Vitamins-Minerals (CENTRUM SILVER PO) Take 1 tablet by mouth daily. Reported on 09/28/2015   Omega-3 Fatty Acids (FISH OIL) 300 MG CAPS Take 300 mg by mouth daily.   potassium chloride SA (KLOR-CON M) 20 MEQ tablet Take 1 tablet (20 mEq total) by mouth once a week.   Saline (SIMPLY SALINE) 0.9 % AERS Place 2 each into the nose as directed. Use nightly for sinus hygiene long-term.  Can also be used as many times daily as desired to assist with clearing congested sinuses.    TRUEplus Lancets 33G MISC USE TO CHECK SUGAR BEFORE MEALS 3 TIMES DAILY   Upadacitinib ER (RINVOQ) 15 MG TB24 Take 1 tablet (15 mg total) by mouth daily.   Past Medical History:  Diagnosis Date    vitamin D deficiency 01/24/2013   01/23/13 - level 29 - supplements 50k IU weekly x 6 recommended    Anabolic steroid abuse    PT. DENIES 03/30/19   Arthritis    Avascular necrosis of femoral head (HCC)    bilateral   Bleeding hemorrhoids    Diabetes mellitus without complication (HCC) 2017   type 2   Drug-induced acute pancreatitis - 09/07/2015   Erectile dysfunction    Hypertension    Left sided ulcerative colitis (HCC)    Psoriasis caused by Humira 04/13/2019   Rhabdomyolysis 12/25/2018   Spinal stenosis of lumbar region at multiple levels    Past Surgical History:  Procedure Laterality Date   COLONOSCOPY     multiple   KNEE  ARTHROSCOPY Right    PROSTATE BIOPSY  08/2020   Negative for cancer   SIGMOIDOSCOPY     Social History   Social History Narrative   Single never married. 1 son 67 in 2018. 1 grandson 72 in 2018. 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   Review of Systems As above  Objective:   Physical Exam @BP  120/78   Pulse 66   Ht 5' 9.5" (1.765 m)   Wt 257 lb (116.6 kg)   BMI 37.41 kg/m @  General:  NAD Eyes:   anicteric Lungs:  clear Heart::  S1S2 no rubs, murmurs or gallops Abdomen:  soft and nontender, BS+     Data Reviewed:  See HPI

## 2023-09-02 NOTE — Patient Instructions (Addendum)
 Your provider has requested that you go to the basement level for lab work before leaving today. Press "B" on the elevator. The lab is located at the first door on the left as you exit the elevator.  Due to recent changes in healthcare laws, you may see the results of your imaging and laboratory studies on MyChart before your provider has had a chance to review them.  We understand that in some cases there may be results that are confusing or concerning to you. Not all laboratory results come back in the same time frame and the provider may be waiting for multiple results in order to interpret others.  Please give [place] 48 hours in order for your provider to thoroughly review all the results before contacting the office for clarification of your results.   Call us late April for a early July Monday appointment.   Please try 2 tablets twice daily of over the counter Imodium for the diarrhea.  I appreciate the opportunity to care for you. Stan Head, MD, [facility]

## 2023-09-02 NOTE — Telephone Encounter (Signed)
 Patient called and stated that he was told by Dr. Leone Payor that he can try to take the over the counter medication but the patient stated that he does not know what the medication was called. Patient stated that if we could let him know there a text message instead. Please advise.

## 2023-09-02 NOTE — Telephone Encounter (Signed)
 Pt has been advised "Please try 2 tablets twice daily of over the counter Imodium for the diarrhea." Per office note from today.

## 2023-09-03 ENCOUNTER — Ambulatory Visit

## 2023-09-03 DIAGNOSIS — K51019 Ulcerative (chronic) pancolitis with unspecified complications: Secondary | ICD-10-CM | POA: Diagnosis not present

## 2023-09-05 ENCOUNTER — Other Ambulatory Visit: Payer: Self-pay | Admitting: Internal Medicine

## 2023-09-05 DIAGNOSIS — E785 Hyperlipidemia, unspecified: Secondary | ICD-10-CM

## 2023-09-05 DIAGNOSIS — R748 Abnormal levels of other serum enzymes: Secondary | ICD-10-CM

## 2023-09-05 LAB — CALPROTECTIN, FECAL: Calprotectin, Fecal: 118 ug/g (ref 0–120)

## 2023-09-05 NOTE — Telephone Encounter (Signed)
 An update: Angel Frazier has been taking one Imodium twice daily and is now only having one bowel movement a day. He is thrilled with how fast this has worked. I will let Dr Leone Payor know.

## 2023-09-30 ENCOUNTER — Encounter: Payer: Self-pay | Admitting: Internal Medicine

## 2023-09-30 ENCOUNTER — Other Ambulatory Visit: Payer: Self-pay | Admitting: Family Medicine

## 2023-09-30 ENCOUNTER — Ambulatory Visit: Payer: Medicare HMO | Admitting: Internal Medicine

## 2023-09-30 VITALS — BP 124/70 | HR 77 | Ht 69.5 in | Wt 249.0 lb

## 2023-09-30 DIAGNOSIS — Z7984 Long term (current) use of oral hypoglycemic drugs: Secondary | ICD-10-CM

## 2023-09-30 DIAGNOSIS — E785 Hyperlipidemia, unspecified: Secondary | ICD-10-CM | POA: Diagnosis not present

## 2023-09-30 DIAGNOSIS — E1165 Type 2 diabetes mellitus with hyperglycemia: Secondary | ICD-10-CM | POA: Diagnosis not present

## 2023-09-30 DIAGNOSIS — E1169 Type 2 diabetes mellitus with other specified complication: Secondary | ICD-10-CM

## 2023-09-30 LAB — POCT GLYCOSYLATED HEMOGLOBIN (HGB A1C): Hemoglobin A1C: 5.9 % — AB (ref 4.0–5.6)

## 2023-09-30 MED ORDER — METFORMIN HCL 1000 MG PO TABS: ORAL_TABLET | ORAL | 3 refills | Status: DC

## 2023-09-30 NOTE — Patient Instructions (Addendum)
 Please continue: - Metformin  1000 mg 2x a day, with meals  Check some sugars later in the day.  Please return in 4-6 months.

## 2023-09-30 NOTE — Progress Notes (Signed)
 Patient ID: Angel Frazier, male   DOB: [birth year], 68 y.o.   MRN: [id number]  HPI: Angel Frazier is a 68 y.o.-year-old male, returning for follow-up for ketosis-prone diabetes, dx in 2017, non-insulin -dependent, controlled, without long-term complications. Pt. previously saw Dr. Washington Hacker, but last visit with me 3 months ago.  Interim history: No increased urination, blurry vision, nausea, chest pain.  Before last visit, he gained 32 pounds.  He lost 16 pounds afterwards.  Last visit, he reduced eating at night, staying more active.  Reviewed HbA1c: Lab Results  Component Value Date   HGBA1C 7.0 (A) 06/24/2023   HGBA1C 6.2 (A) 12/17/2022   HGBA1C 6.4 (A) 06/11/2022   HGBA1C 6.7 (H) 02/12/2022   HGBA1C 6.2 (A) 08/21/2021   HGBA1C 5.6 02/20/2021   HGBA1C 6.4 (A) 08/19/2020   HGBA1C 7.1 (H) 08/09/2020   HGBA1C 6.7 (A) 02/15/2020   HGBA1C 6.1 (A) 10/12/2019   Pt is on a regimen of: - Metformin  1000 mg 2x a day, with meals Rybelsus  was too expensive. He was on insulin  after diagnosis but then was able to come off.  Pt checks his sugars 1-3x a day and they are: - am: 110-160 >> 110-115, 144 (steak) >> 100-110 >> 100-160 >> 110, 120-143, 150 - 2h after b'fast: n/c - before lunch: n/c - 2h after lunch: n/c - before dinner: 110-150 >> n/c >> 140-160 >>  n/c - 2h after dinner: n/c - bedtime: n/c  - nighttime: n/c Lowest sugar was 95 >> 100 >> 110. Highest sugar was 160 >> 144 >> 160 >> 150.  Glucometer: True Metrix  He saw nutrition in the past >> decreased carb intake afterwards.  However, he relaxed his diet during the holidays 11-05/2023.  - no CKD, last BUN/creatinine:  Lab Results  Component Value Date   BUN 17 08/19/2023   BUN 13 07/22/2023   CREATININE 1.06 08/19/2023   CREATININE 0.96 07/22/2023   Lab Results  Component Value Date   MICRALBCREAT 0.6 12/28/2022   MICRALBCREAT 3.6 02/12/2022   MICRALBCREAT 3.2 12/16/2020   MICRALBCREAT 3.3 12/08/2019   MICRALBCREAT 2.8  12/04/2018   MICRALBCREAT 1.7 09/11/2017   MICRALBCREAT 30 08/30/2016  He is not on ACE inhibitor/ARB.  -+ HL; last set of lipids: Lab Results  Component Value Date   CHOL 148 12/28/2022   HDL 46.60 12/28/2022   LDLCALC 82 12/28/2022   LDLDIRECT 64.0 12/08/2019   TRIG 97.0 12/28/2022   CHOLHDL 3 12/28/2022  On Lipitor 40 mg daily, omega-3 fatty acids.  - last eye exam was 06/21/2023: No DR.   - no numbness and tingling in his feet.  Last foot exam 12/17/2022.  He has a history of ulcerative colitis, psoriasis, osteoarthritis, history of pancreatitis from 6-MP, spinal stenosis, vitamin D  deficiency.  ROS: + see HPI  Past Medical History:  Diagnosis Date    vitamin D  deficiency 01/24/2013   01/23/13 - level 29 - supplements 50k IU weekly x 6 recommended    Anabolic steroid abuse    PT. DENIES 03/30/19   Arthritis    Avascular necrosis of femoral head (HCC)    bilateral   Bleeding hemorrhoids    Diabetes mellitus without complication (HCC) 2017   type 2   Drug-induced acute pancreatitis - 09/07/2015   Erectile dysfunction    Hypertension    Left sided ulcerative colitis (HCC)    Psoriasis caused by Humira  04/13/2019   Rhabdomyolysis 12/25/2018   Spinal stenosis of lumbar region at  multiple levels    Past Surgical History:  Procedure Laterality Date   COLONOSCOPY     multiple   KNEE ARTHROSCOPY Right    PROSTATE BIOPSY  08/2020   Negative for cancer   SIGMOIDOSCOPY     Social History   Socioeconomic History   Marital status: Single    Spouse name: Not on file   Number of children: 1   Years of education: Not on file   Highest education level: Not on file  Occupational History   Occupation: retired    Associate Professor: [employer]  Tobacco Use   Smoking status: Former    Current packs/day: 0.00    Average packs/day: 1 pack/day for 6.0 years (6.0 ttl pk-yrs)    Types: Cigarettes    Start date: 06/04/1994    Quit date: 06/04/2000    Years since quitting:  23.3   Smokeless tobacco: Never  Vaping Use   Vaping status: Never Used  Substance and Sexual Activity   Alcohol  use: Not Currently    Comment: occasionally   Drug use: No   Sexual activity: Not Currently  Other Topics Concern   Not on file  Social History Narrative   Single never married. 1 son 60 in 2018. 1 grandson 46 in 2018. Lives in [place]   Lives alone      Disability- low back issues   Roll up machine operator      Hobbies: enjoys tinkering in his shed, enjoys going to the gym   Social Drivers of Health   Financial Resource Strain: Low Risk  (07/08/2023)   Overall Financial Resource Strain (CARDIA)    Difficulty of Paying Living Expenses: Not hard at all  Food Insecurity: No Food Insecurity (07/08/2023)   Hunger Vital Sign    Worried About Running Out of Food in the Last Year: Never true    Ran Out of Food in the Last Year: Never true  Transportation Needs: No Transportation Needs (07/08/2023)   PRAPARE - Administrator, Civil Service (Medical): No    Lack of Transportation (Non-Medical): No  Physical Activity: Sufficiently Active (07/08/2023)   Exercise Vital Sign    Days of Exercise per Week: 4 days    Minutes of Exercise per Session: 50 min  Stress: No Stress Concern Present (07/08/2023)   [employer] of Occupational Health - Occupational Stress Questionnaire    Feeling of Stress : Not at all  Social Connections: Moderately Isolated (07/08/2023)   Social Connection and Isolation Panel [NHANES]    Frequency of Communication with Friends and Family: More than three times a week    Frequency of Social Gatherings with Friends and Family: More than three times a week    Attends Religious Services: More than 4 times per year    Active Member of [employer] or Organizations: No    Attends Banker Meetings: Never    Marital Status: Never married  Intimate Partner Violence: Not At Risk (07/08/2023)   Humiliation, Afraid, Rape, and Kick questionnaire     Fear of Current or Ex-Partner: No    Emotionally Abused: No    Physically Abused: No    Sexually Abused: No   Current Outpatient Medications on File Prior to Visit  Medication Sig Dispense Refill   Alcohol  Swabs  (B-D SINGLE USE SWABS  REGULAR) PADS USE AS DIRECTED OR NEEDED 300 each 12   amLODipine  (NORVASC ) 5 MG tablet TAKE 1 TABLET AT BEDTIME 90 tablet 3   Ascorbic  Acid (VITAMIN C ) 1000 MG tablet Take 1,000 mg by mouth daily. Reported on 09/28/2015     aspirin  81 MG tablet Take 81 mg by mouth daily. Reported on 09/28/2015     atorvastatin  (LIPITOR) 40 MG tablet TAKE 1 TABLET ONCE A WEEK. 13 tablet 3   Blood Glucose Calibration (TRUE METRIX LEVEL 1) Low SOLN Use as directed 1 each 1   blood glucose meter kit and supplies Dispense based on patient and insurance preference. Use up to four times daily as directed. (FOR ICD-9 250.00, 250.01). 1 each 0   Blood Glucose Monitoring Suppl (TRUE METRIX METER) w/Device KIT USE AS DIRECTED 1 kit 1   Calcium  Carbonate-Vitamin D  (CALCIUM -VITAMIN D3 PO) Take 1 tablet by mouth daily. Reported on 09/28/2015     Cyanocobalamin  (B-12 PO) 5000mg  daily     glucose blood (TRUE METRIX BLOOD GLUCOSE TEST) test strip Use as instructed to check blood sugar before meals. 300 each 12   hydrochlorothiazide  (HYDRODIURIL ) 25 MG tablet Take 1 tablet (25 mg total) by mouth daily. 90 tablet 3   LIALDA  1.2 g EC tablet TAKE TWO TABLETS BY MOUTH TWICE A DAY 360 tablet 2   metFORMIN  (GLUCOPHAGE ) 1000 MG tablet TAKE 1 TABLET(1000 MG) BY MOUTH TWICE DAILY WITH A MEAL 180 tablet 3   Multiple Vitamins-Minerals (CENTRUM SILVER PO) Take 1 tablet by mouth daily. Reported on 09/28/2015     Omega-3 Fatty Acids (FISH OIL) 300 MG CAPS Take 300 mg by mouth daily.     potassium chloride  SA (KLOR-CON  M) 20 MEQ tablet Take 1 tablet (20 mEq total) by mouth once a week. 13 tablet 3   Saline (SIMPLY SALINE) 0.9 % AERS Place 2 each into the nose as directed. Use nightly for sinus hygiene long-term.   Can also be used as many times daily as desired to assist with clearing congested sinuses. 127 mL 11   TRUEplus Lancets 33G MISC USE TO CHECK SUGAR BEFORE MEALS 3 TIMES DAILY 300 each 3   Upadacitinib  ER (RINVOQ ) 15 MG TB24 Take 1 tablet (15 mg total) by mouth daily. 30 tablet 11   No current facility-administered medications on file prior to visit.   Allergies  Allergen Reactions   Humira  [Adalimumab ] Rash    psoriasis   Mercaptopurine  Other (See Comments)    Caused Pancreatitis   Family History  Problem Relation Age of Onset   Diabetes Mother    Breast cancer Sister    Diabetes Other        cousin   Hypertension Other    Diabetes type I Son    Colon cancer Neg Hx    Esophageal cancer Neg Hx    Rectal cancer Neg Hx    Stomach cancer Neg Hx    Colon polyps Neg Hx    PE: BP 124/70   Pulse 77   Ht 5' 9.5" (1.765 m)   Wt 249 lb (112.9 kg)   SpO2 96%   BMI 36.24 kg/m  Wt Readings from Last 10 Encounters:  09/30/23 249 lb (112.9 kg)  09/02/23 257 lb (116.6 kg)  07/08/23 255 lb 12.8 oz (116 kg)  07/01/23 253 lb (114.8 kg)  06/24/23 265 lb 6.4 oz (120.4 kg)  05/10/23 248 lb 6 oz (112.7 kg)  03/05/23 238 lb (108 kg)  01/15/23 239 lb 12.8 oz (108.8 kg)  01/15/23 238 lb (108 kg)  12/28/22 237 lb 9.6 oz (107.8 kg)   Constitutional: overweight, in NAD Eyes:  EOMI, no  exophthalmos ENT: no neck masses, no cervical lymphadenopathy Cardiovascular: RRR, No MRG Respiratory: CTA B Musculoskeletal: no deformities Skin:no rashes Neurological: no tremor with outstretched hands Diabetic Foot Exam - Simple   Simple Foot Form Diabetic Foot exam was performed with the following findings: Yes 09/30/2023  9:31 AM  Visual Inspection No deformities, no ulcerations, no other skin breakdown bilaterally: Yes Sensation Testing Intact to touch and monofilament testing bilaterally: Yes Pulse Check Posterior Tibialis and Dorsalis pulse intact bilaterally: Yes Comments    ASSESSMENT: 1.   Ketosis-prone diabetes (KPD), non-insulin -dependent, controlled, without long-term complications, but with hyperglycemia - h/o DKA - per Dr. Jacqualyn Mates notes - ED -unknown if related to diabetes.  2. HL  PLAN:  1. Patient with longstanding, uncontrolled, type 2 diabetes, on oral antidiabetic regimen with metformin  only, with good control.  At last visit, HbA1c was higher, though, at 7.0% after he relaxed his diet during the holidays.  Also, per our scale, he gained 32 pounds.  Part of it could have been heavy clothing but he still gained a significant amount of weight.  We discussed about the need to improve diet but I did not suggest a change in regimen.  Sugars were slightly above target but I also advised him to check some sugars at bedtime and 2 hours after dinner to detect trends. - At today's visit, sugars are at or slightly above goal in the morning but he is not checking sugars around later in the day.  We again discussed about the importance of checking some sugars later in the day, maybe when he comes home from work, but he mentions that he is busy and forgets to check it.  For now, especially since the HbA1c is better (see below), we discussed about continuing the same regimen.  I refilled his metformin  to [facility]. - I suggested to:  Patient Instructions  Please continue: - Metformin  1000 mg 2x a day, with meals  Check some sugars later in the day.  Please return in 4-6 months.  - we checked his HbA1c: 5.9% (lower) - advised to check sugars at different times of the day - 1x a day, rotating check times - advised for yearly eye exams >> he is UTD - will recheck his ACR today - return to clinic in 4-6 months  2. HL - Reviewed latest lipid panel from 12/2022: All fractions at goal: Lab Results  Component Value Date   CHOL 148 12/28/2022   HDL 46.60 12/28/2022   LDLCALC 82 12/28/2022   LDLDIRECT 64.0 12/08/2019   TRIG 97.0 12/28/2022   CHOLHDL 3 12/28/2022  - He continues  Lipitor 40 mg daily and omega-3 fatty acids without side effects  Emilie Harden, MD PhD [facility]

## 2023-10-01 ENCOUNTER — Encounter: Payer: Self-pay | Admitting: Internal Medicine

## 2023-10-01 ENCOUNTER — Telehealth: Payer: Self-pay | Admitting: Internal Medicine

## 2023-10-01 LAB — MICROALBUMIN / CREATININE URINE RATIO
Creatinine, Urine: 282 mg/dL (ref 20–320)
Microalb Creat Ratio: 13 mg/g{creat} (ref <30)
Microalb, Ur: 3.7 mg/dL

## 2023-10-01 NOTE — Telephone Encounter (Signed)
 PT called to update that his assistant application was denied and he would like to discuss next steps. Please advise.

## 2023-10-01 NOTE — Telephone Encounter (Signed)
 I spoke with Angel Frazier and he is talking about his Lialda  assistance. He said they will send us  forms to fill out. I reminded Angel Frazier to come early May for his fasting blood work. He needs a July appointment but we don't have any open at this time.

## 2023-10-07 ENCOUNTER — Other Ambulatory Visit (INDEPENDENT_AMBULATORY_CARE_PROVIDER_SITE_OTHER)

## 2023-10-07 DIAGNOSIS — E785 Hyperlipidemia, unspecified: Secondary | ICD-10-CM

## 2023-10-07 DIAGNOSIS — R748 Abnormal levels of other serum enzymes: Secondary | ICD-10-CM

## 2023-10-07 LAB — HEPATIC FUNCTION PANEL
ALT: 53 U/L (ref 0–53)
AST: 59 U/L — ABNORMAL HIGH (ref 0–37)
Albumin: 4.5 g/dL (ref 3.5–5.2)
Alkaline Phosphatase: 21 U/L — ABNORMAL LOW (ref 39–117)
Bilirubin, Direct: 0.1 mg/dL (ref 0.0–0.3)
Total Bilirubin: 0.8 mg/dL (ref 0.2–1.2)
Total Protein: 7.4 g/dL (ref 6.0–8.3)

## 2023-10-07 LAB — LIPID PANEL
Cholesterol: 157 mg/dL (ref 0–200)
HDL: 46.7 mg/dL (ref >39.00)
LDL Cholesterol: 96 mg/dL (ref 0–99)
NonHDL: 110.75
Total CHOL/HDL Ratio: 3
Triglycerides: 76 mg/dL (ref 0.0–149.0)
VLDL: 15.2 mg/dL (ref 0.0–40.0)

## 2023-10-09 NOTE — Telephone Encounter (Signed)
 I spoke with Angel Frazier and told him I haven't seen any Lialda  paperwork come thru yet. He is going to call them when he gets home.

## 2023-10-14 ENCOUNTER — Other Ambulatory Visit: Payer: Self-pay | Admitting: Internal Medicine

## 2023-10-14 DIAGNOSIS — R748 Abnormal levels of other serum enzymes: Secondary | ICD-10-CM

## 2023-10-15 NOTE — Telephone Encounter (Signed)
 He is going to try and contact them again since we still don't have the paperwork.

## 2023-10-17 NOTE — Telephone Encounter (Signed)
 Angel Frazier came by and signed the forms and brought his financial paper. I completed the forms and faxed them to [facility] fax # [phone].

## 2023-10-17 NOTE — Telephone Encounter (Signed)
 The paperwork came from [employer] and Dr Willy Harvest has signed it. I have left voice mail messages on both his home and cell #'s to come up and sign his part and bring proof of income.

## 2023-10-18 NOTE — Telephone Encounter (Signed)
 We received fax confirmation that Angel Frazier received the forms.

## 2023-10-29 NOTE — Telephone Encounter (Signed)
 I have confirmation that Angel Frazier got the paperwork on 10/17/2023 at 5:58pm. I have re-faxed it today as requested and I informed [name].

## 2023-10-29 NOTE — Telephone Encounter (Signed)
 PT is calling to update that he spoke with [employer] at [facility] and they still have not received the forms. Please refax to [phone]

## 2023-10-30 NOTE — Telephone Encounter (Signed)
 Zalan informed that we got an Artist. His Lialda  rx is good thru 06/03/24. Forms will be scanned into epic.

## 2023-11-04 ENCOUNTER — Other Ambulatory Visit (INDEPENDENT_AMBULATORY_CARE_PROVIDER_SITE_OTHER)

## 2023-11-04 ENCOUNTER — Ambulatory Visit: Payer: Self-pay | Admitting: Internal Medicine

## 2023-11-04 DIAGNOSIS — R748 Abnormal levels of other serum enzymes: Secondary | ICD-10-CM | POA: Diagnosis not present

## 2023-11-04 LAB — HEPATIC FUNCTION PANEL
ALT: 44 U/L (ref 0–53)
AST: 45 U/L — ABNORMAL HIGH (ref 0–37)
Albumin: 4.4 g/dL (ref 3.5–5.2)
Alkaline Phosphatase: 22 U/L — ABNORMAL LOW (ref 39–117)
Bilirubin, Direct: 0.2 mg/dL (ref 0.0–0.3)
Total Bilirubin: 0.7 mg/dL (ref 0.2–1.2)
Total Protein: 7.2 g/dL (ref 6.0–8.3)

## 2023-11-18 ENCOUNTER — Other Ambulatory Visit

## 2023-11-18 ENCOUNTER — Ambulatory Visit: Admitting: Internal Medicine

## 2023-11-18 ENCOUNTER — Encounter: Payer: Self-pay | Admitting: Internal Medicine

## 2023-11-18 VITALS — BP 110/84 | HR 81 | Ht 70.0 in | Wt 241.0 lb

## 2023-11-18 DIAGNOSIS — Z5181 Encounter for therapeutic drug level monitoring: Secondary | ICD-10-CM

## 2023-11-18 DIAGNOSIS — K51019 Ulcerative (chronic) pancolitis with unspecified complications: Secondary | ICD-10-CM | POA: Diagnosis not present

## 2023-11-18 DIAGNOSIS — R748 Abnormal levels of other serum enzymes: Secondary | ICD-10-CM

## 2023-11-18 DIAGNOSIS — K760 Fatty (change of) liver, not elsewhere classified: Secondary | ICD-10-CM

## 2023-11-18 DIAGNOSIS — Z7960 Long term (current) use of unspecified immunomodulators and immunosuppressants: Secondary | ICD-10-CM

## 2023-11-18 NOTE — Progress Notes (Signed)
 Angel Frazier 68 y.o. 10-22-1955 [id number]  Assessment & Plan:   Encounter Diagnoses  Name Primary?   Universal ulcerative colitis with complication (HCC) Yes   Metabolic dysfunction-associated fatty liver disease (MAFLD) suspected    Abnormal transaminases    Long-term use of immunosuppressant medication     He appears to be doing well with the use of a Rinvoq , mesalamine  p.o., and Imodium.  Will check fecal calprotectin.  Once I review that we will decide on timing of colonoscopy.  As long as the calprotectin is not significantly elevated would continue with current dose of Rinvoq  and other medications.  Need to consider backing off the Imodium as well though I do not think it is causing harm it could be clouding the issue regarding response to disease specific therapy.    Regarding abnormal transaminases and suggestion of fatty liver by CT scanning, he has a fib 4 calculation of 2.13 and this requires additional investigation so we will perform hepatic elastography.  He certainly is a strong candidate to have MAFLD, Rinvoq  may cause abnormal transaminases as well so we will keep that in mind.  Prednisone  usage may also contribute to fatty liver changes.  The trend has been down.  I do not think he is a significant alcohol  user but I did discuss reducing beer consumption with the patient.  Further recommendations pending elastography.   Colonoscopy later this year  Subjective:  Gastroenterology summary 03/14/04 colonoscopy left UC, asacol  initiated No active disease on sigmoidoscopy in 2012 Maintained on mesalamine  4.8 g daily, Asacol  HD and then Lialda  12/2012 - flare Tx prednisone  2015-16 - prednisone  required 2017-18 prednisone  3-4 x hypersensitivity with mercaptopurine  Humira  initiated spring 2020 Humira  stopped late 2020 due to psoriasis Late 2022, Stelara  begun after colonoscopy demonstrated active left and some microscopic right colitis Patient gets assistance with  mesalamine  and Stelara  Colonoscopy 07/09/2022-normal TI, patchy plus diffuse mild colitis seen more pronounced in left colon and biopsies in the right colon (cecum ascending and transverse were normal left colon showed active colitis Patient called with flare late February started on prednisone  taper   Ustekinumab  levels 0.5 08/13/22 - no Abs - moved to q 4 week dosing 12/2022 improved 02/11/23 flare - neg C diff - prednisone  taper   03/1023 - Ustekinumab  levels 4.2 no abs monthly stelara    11/24 another flare and prednisone  taper   January 2025 Rinvoq  started 45 mg daily x 8 weeks then 15 mg daily  Abnormal transaminases, CT of the chest suggest fatty liver  ------------------------------------------------------------------------------------------------------------- Chief Complaint: Follow-up of ulcerative colitis  Patient agreed to the use of artificial intelligence scribe.  HPI 68 year old man with ulcerative colitis and GI issues as outlined above presenting for scheduled follow-up.  He reports that he is doing well overall with 2-3 stools a day on his medication regimen which includes Imodium 2 mg twice daily.  Stools are generally formed or soft.  No bleeding no significant abdominal pain.  He has had abnormal transaminases that I have been tracking. A CT of the chest has suggested fatty liver changes. He has several beers a week but not more than 5 or 6.  Lab Results  Component Value Date   ALT 44 11/04/2023   AST 45 (H) 11/04/2023   ALKPHOS 22 (L) 11/04/2023   BILITOT 0.7 11/04/2023      Allergies  Allergen Reactions   Humira  [Adalimumab ] Rash    psoriasis   Mercaptopurine  Other (See Comments)    Caused Pancreatitis  Current Meds  Medication Sig   Alcohol  Swabs  (B-D SINGLE USE SWABS  REGULAR) PADS USE AS DIRECTED OR NEEDED   amLODipine  (NORVASC ) 5 MG tablet TAKE 1 TABLET AT BEDTIME   Ascorbic Acid (VITAMIN C ) 1000 MG tablet Take 1,000 mg by mouth daily. Reported on  09/28/2015   aspirin  81 MG tablet Take 81 mg by mouth daily. Reported on 09/28/2015   atorvastatin  (LIPITOR) 40 MG tablet TAKE 1 TABLET ONCE A WEEK.   Blood Glucose Calibration (TRUE METRIX LEVEL 1) Low SOLN Use as directed   blood glucose meter kit and supplies Dispense based on patient and insurance preference. Use up to four times daily as directed. (FOR ICD-9 250.00, 250.01).   Blood Glucose Monitoring Suppl (TRUE METRIX METER) w/Device KIT USE AS DIRECTED   Calcium  Carbonate-Vitamin D  (CALCIUM -VITAMIN D3 PO) Take 1 tablet by mouth daily. Reported on 09/28/2015   Cyanocobalamin  (B-12 PO) 5000mg  daily   glucose blood (TRUE METRIX BLOOD GLUCOSE TEST) test strip Use as instructed to check blood sugar before meals.   hydrochlorothiazide  (HYDRODIURIL ) 25 MG tablet TAKE 1 TABLET EVERY DAY   LIALDA  1.2 g EC tablet TAKE TWO TABLETS BY MOUTH TWICE A DAY   loperamide (IMODIUM A-D) 2 MG tablet Take 2 mg by mouth in the morning and at bedtime.   metFORMIN  (GLUCOPHAGE ) 1000 MG tablet TAKE 1 TABLET(1000 MG) BY MOUTH TWICE DAILY WITH A MEAL   Multiple Vitamins-Minerals (CENTRUM SILVER PO) Take 1 tablet by mouth daily. Reported on 09/28/2015   Omega-3 Fatty Acids (FISH OIL) 300 MG CAPS Take 300 mg by mouth daily.   potassium chloride  SA (KLOR-CON  M) 20 MEQ tablet Take 1 tablet (20 mEq total) by mouth once a week.   Saline (SIMPLY SALINE) 0.9 % AERS Place 2 each into the nose as directed. Use nightly for sinus hygiene long-term.  Can also be used as many times daily as desired to assist with clearing congested sinuses.   TRUEplus Lancets 33G MISC USE TO CHECK SUGAR BEFORE MEALS 3 TIMES DAILY   Upadacitinib  ER (RINVOQ ) 15 MG TB24 Take 1 tablet (15 mg total) by mouth daily.   Past Medical History:  Diagnosis Date    vitamin D  deficiency 01/24/2013   01/23/13 - level 29 - supplements 50k IU weekly x 6 recommended    Anabolic steroid abuse    PT. DENIES 03/30/19   Arthritis    Avascular necrosis of femoral head  (HCC)    bilateral   Bleeding hemorrhoids    Diabetes mellitus without complication (HCC) 2017   type 2   Drug-induced acute pancreatitis - 09/07/2015   Erectile dysfunction    Hypertension    Left sided ulcerative colitis (HCC)    Psoriasis caused by Humira  04/13/2019   Rhabdomyolysis 12/25/2018   Spinal stenosis of lumbar region at multiple levels    Past Surgical History:  Procedure Laterality Date   COLONOSCOPY     multiple   KNEE ARTHROSCOPY Right    PROSTATE BIOPSY  08/2020   Negative for cancer   SIGMOIDOSCOPY     Social History   Social History Narrative   Single never married. 1 son 71 in 2018. 1 grandson 68 in 2018. 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   Review of Systems See HPI  Objective:   Physical Exam @BP  110/84   Pulse 81   Ht 5' 10 (1.778 m)   Wt 241 lb (109.3 kg)   BMI 34.58 kg/m @  General:  NAD Eyes:   anicteric Lungs:  clear Heart::  S1S2 no rubs, murmurs or gallops Abdomen:  soft and nontender, BS+ Ext:   no edema, cyanosis or clubbing    Data Reviewed:  See HPI and summary

## 2023-11-18 NOTE — Patient Instructions (Addendum)
 Your provider has requested that you go to the basement level for lab work before leaving today. Press B on the elevator. The lab is located at the first door on the left as you exit the elevator.  Due to recent changes in healthcare laws, you may see the results of your imaging and laboratory studies on MyChart before your provider has had a chance to review them.  We understand that in some cases there may be results that are confusing or concerning to you. Not all laboratory results come back in the same time frame and the provider may be waiting for multiple results in order to interpret others.  Please give us  48 hours in order for your provider to thoroughly review all the results before contacting the office for clarification of your results.    You have been scheduled for an abdominal ultrasound at [facility] (1st floor of hospital) on 11/06/2023 at 7:00am. Please arrive 30 minutes prior to your appointment for registration. Make certain not to have anything to eat or drink 6 hours prior to your appointment. Should you need to reschedule your appointment, please contact radiology at [phone]. This test typically takes about 30 minutes to perform.   Cut down on your Beer intake.   I appreciate the opportunity to care for you. Loy Ruff, MD, [facility]

## 2023-11-20 LAB — CALPROTECTIN, FECAL: Calprotectin, Fecal: 167 ug/g — ABNORMAL HIGH (ref 0–120)

## 2023-11-22 ENCOUNTER — Ambulatory Visit: Payer: Self-pay | Admitting: Internal Medicine

## 2023-11-22 DIAGNOSIS — K51019 Ulcerative (chronic) pancolitis with unspecified complications: Secondary | ICD-10-CM

## 2023-12-30 DIAGNOSIS — N5201 Erectile dysfunction due to arterial insufficiency: Secondary | ICD-10-CM | POA: Diagnosis not present

## 2023-12-30 DIAGNOSIS — E230 Hypopituitarism: Secondary | ICD-10-CM | POA: Diagnosis not present

## 2024-01-06 ENCOUNTER — Ambulatory Visit: Payer: Medicare HMO | Admitting: Family Medicine

## 2024-01-06 ENCOUNTER — Encounter: Payer: Self-pay | Admitting: Family Medicine

## 2024-01-06 ENCOUNTER — Ambulatory Visit: Payer: Self-pay | Admitting: Family Medicine

## 2024-01-06 ENCOUNTER — Ambulatory Visit (HOSPITAL_COMMUNITY)
Admission: RE | Admit: 2024-01-06 | Discharge: 2024-01-06 | Disposition: A | Discharge status: Home / Self Care | Source: Ambulatory Visit | Attending: Internal Medicine | Admitting: Internal Medicine

## 2024-01-06 VITALS — BP 126/70 | HR 97 | Temp 97.4°F | Ht 70.0 in | Wt 231.4 lb

## 2024-01-06 DIAGNOSIS — I10 Essential (primary) hypertension: Secondary | ICD-10-CM

## 2024-01-06 DIAGNOSIS — K51019 Ulcerative (chronic) pancolitis with unspecified complications: Secondary | ICD-10-CM | POA: Diagnosis not present

## 2024-01-06 DIAGNOSIS — E1165 Type 2 diabetes mellitus with hyperglycemia: Secondary | ICD-10-CM | POA: Diagnosis not present

## 2024-01-06 DIAGNOSIS — K760 Fatty (change of) liver, not elsewhere classified: Secondary | ICD-10-CM | POA: Diagnosis not present

## 2024-01-06 DIAGNOSIS — M25472 Effusion, left ankle: Secondary | ICD-10-CM | POA: Diagnosis not present

## 2024-01-06 DIAGNOSIS — E785 Hyperlipidemia, unspecified: Secondary | ICD-10-CM

## 2024-01-06 DIAGNOSIS — E559 Vitamin D deficiency, unspecified: Secondary | ICD-10-CM

## 2024-01-06 DIAGNOSIS — R748 Abnormal levels of other serum enzymes: Secondary | ICD-10-CM | POA: Insufficient documentation

## 2024-01-06 DIAGNOSIS — Z125 Encounter for screening for malignant neoplasm of prostate: Secondary | ICD-10-CM | POA: Diagnosis not present

## 2024-01-06 DIAGNOSIS — E1169 Type 2 diabetes mellitus with other specified complication: Secondary | ICD-10-CM

## 2024-01-06 DIAGNOSIS — Z0000 Encounter for general adult medical examination without abnormal findings: Secondary | ICD-10-CM | POA: Diagnosis not present

## 2024-01-06 DIAGNOSIS — Z7984 Long term (current) use of oral hypoglycemic drugs: Secondary | ICD-10-CM | POA: Diagnosis not present

## 2024-01-06 LAB — COMPREHENSIVE METABOLIC PANEL WITH GFR
ALT: 47 U/L (ref 0–53)
AST: 48 U/L — ABNORMAL HIGH (ref 0–37)
Albumin: 4.7 g/dL (ref 3.5–5.2)
Alkaline Phosphatase: 29 U/L — ABNORMAL LOW (ref 39–117)
BUN: 16 mg/dL (ref 6–23)
CO2: 29 meq/L (ref 19–32)
Calcium: 9.1 mg/dL (ref 8.4–10.5)
Chloride: 98 meq/L (ref 96–112)
Creatinine, Ser: 0.98 mg/dL (ref 0.40–1.50)
GFR: 78.53 mL/min (ref >60.00)
Glucose, Bld: 112 mg/dL — ABNORMAL HIGH (ref 70–99)
Potassium: 3.2 meq/L — ABNORMAL LOW (ref 3.5–5.1)
Sodium: 139 meq/L (ref 135–145)
Total Bilirubin: 1.2 mg/dL (ref 0.2–1.2)
Total Protein: 7.6 g/dL (ref 6.0–8.3)

## 2024-01-06 LAB — CBC WITH DIFFERENTIAL/PLATELET
Basophils Absolute: 0 K/uL (ref 0.0–0.1)
Basophils Relative: 0.1 % (ref 0.0–3.0)
Eosinophils Absolute: 0 K/uL (ref 0.0–0.7)
Eosinophils Relative: 0.2 % (ref 0.0–5.0)
HCT: 38.7 % — ABNORMAL LOW (ref 39.0–52.0)
Hemoglobin: 13 g/dL (ref 13.0–17.0)
Lymphocytes Relative: 16.8 % (ref 12.0–46.0)
Lymphs Abs: 1.2 K/uL (ref 0.7–4.0)
MCHC: 33.7 g/dL (ref 30.0–36.0)
MCV: 96.8 fl (ref 78.0–100.0)
Monocytes Absolute: 0.8 K/uL (ref 0.1–1.0)
Monocytes Relative: 11.1 % (ref 3.0–12.0)
Neutro Abs: 5 K/uL (ref 1.4–7.7)
Neutrophils Relative %: 71.8 % (ref 43.0–77.0)
Platelets: 231 K/uL (ref 150.0–400.0)
RBC: 4 Mil/uL — ABNORMAL LOW (ref 4.22–5.81)
RDW: 13.3 % (ref 11.5–15.5)
WBC: 6.9 K/uL (ref 4.0–10.5)

## 2024-01-06 LAB — MICROALBUMIN / CREATININE URINE RATIO
Creatinine,U: 259.5 mg/dL
Microalb Creat Ratio: 22.8 mg/g (ref 0.0–30.0)
Microalb, Ur: 5.9 mg/dL — ABNORMAL HIGH (ref 0.0–1.9)

## 2024-01-06 LAB — VITAMIN D 25 HYDROXY (VIT D DEFICIENCY, FRACTURES): VITD: 53.61 ng/mL (ref 30.00–100.00)

## 2024-01-06 LAB — PSA, MEDICARE: PSA: 1.27 ng/mL (ref 0.10–4.00)

## 2024-01-06 NOTE — Progress Notes (Signed)
 Phone: [phone]   Subjective:  Patient presents today for their annual physical. Chief complaint-noted.   See problem oriented charting- ROS- full  review of systems was completed and negative  Per full ROS sheet completed by patient except for topics noted under acute/chronic concerns  The following were reviewed and entered/updated in epic: Past Medical History:  Diagnosis Date    vitamin D  deficiency 01/24/2013   01/23/13 - level 29 - supplements 50k IU weekly x 6 recommended    Anabolic steroid abuse    PT. DENIES 03/30/19   Arthritis    Avascular necrosis of femoral head (HCC)    bilateral   Bleeding hemorrhoids    Diabetes mellitus without complication (HCC) 2017   type 2   Drug-induced acute pancreatitis - 09/07/2015   Erectile dysfunction    Hypertension    Left sided ulcerative colitis (HCC)    Psoriasis caused by Humira  04/13/2019   Rhabdomyolysis 12/25/2018   Spinal stenosis of lumbar region at multiple levels    Patient Active Problem List   Diagnosis Date Noted   Type 2 diabetes mellitus with hyperglycemia, without long-term current use of insulin  (HCC) 12/22/2018    Priority: High   Long-term use of immunosuppressant medication Stelara  07/12/2015    Priority: High   Universal ulcerative colitis (HCC) 08/11/2008    Priority: High   Hyperlipidemia associated with type 2 diabetes mellitus (HCC) 01/20/2019    Priority: Medium    Essential hypertension 01/20/2019    Priority: Medium    Pulmonary nodules 09/07/2015    Priority: Medium    Former smoker 09/17/2019    Priority: Low   Long term current use of systemic steroids 11/18/2014    Priority: Low   History of avascular necrosis  11/18/2014    Priority: Low   Vitamin D  deficiency 01/24/2013    Priority: Low   ED (erectile dysfunction) 05/21/2012    Priority: Low   Metabolic dysfunction-associated fatty liver disease (MAFLD) suspected 11/18/2023   Psoriasis caused by Humira  04/13/2019   Past  Surgical History:  Procedure Laterality Date   COLONOSCOPY     multiple   KNEE ARTHROSCOPY Right    PROSTATE BIOPSY  08/2020   Negative for cancer   SIGMOIDOSCOPY      Family History  Problem Relation Age of Onset   Diabetes Mother    Breast cancer Sister    Diabetes Other        cousin   Hypertension Other    Diabetes type I Son    Colon cancer Neg Hx    Esophageal cancer Neg Hx    Rectal cancer Neg Hx    Stomach cancer Neg Hx    Colon polyps Neg Hx     Medications- reviewed and updated Current Outpatient Medications  Medication Sig Dispense Refill   Ascorbic Acid (VITAMIN C ) 1000 MG tablet Take 1,000 mg by mouth daily. Reported on 09/28/2015     aspirin  81 MG tablet Take 81 mg by mouth daily. Reported on 09/28/2015     atorvastatin  (LIPITOR) 40 MG tablet TAKE 1 TABLET ONCE A WEEK. 13 tablet 3   Blood Glucose Calibration (TRUE METRIX LEVEL 1) Low SOLN Use as directed 1 each 1   blood glucose meter kit and supplies Dispense based on patient and insurance preference. Use up to four times daily as directed. (FOR ICD-9 250.00, 250.01). 1 each 0   Blood Glucose Monitoring Suppl (TRUE METRIX METER) w/Device KIT USE AS DIRECTED 1 kit  1   Calcium  Carbonate-Vitamin D  (CALCIUM -VITAMIN D3 PO) Take 1 tablet by mouth daily. Reported on 09/28/2015     Cyanocobalamin  (B-12 PO) 5000mg  daily     glucose blood (TRUE METRIX BLOOD GLUCOSE TEST) test strip Use as instructed to check blood sugar before meals. 300 each 12   hydrochlorothiazide  (HYDRODIURIL ) 25 MG tablet TAKE 1 TABLET EVERY DAY 90 tablet 3   LIALDA  1.2 g EC tablet TAKE TWO TABLETS BY MOUTH TWICE A DAY 360 tablet 2   loperamide (IMODIUM A-D) 2 MG tablet Take 2 mg by mouth in the morning and at bedtime.     metFORMIN  (GLUCOPHAGE ) 1000 MG tablet TAKE 1 TABLET(1000 MG) BY MOUTH TWICE DAILY WITH A MEAL 180 tablet 3   Multiple Vitamins-Minerals (CENTRUM SILVER PO) Take 1 tablet by mouth daily. Reported on 09/28/2015     Omega-3 Fatty Acids  (FISH OIL) 300 MG CAPS Take 300 mg by mouth daily.     potassium chloride  SA (KLOR-CON  M) 20 MEQ tablet Take 1 tablet (20 mEq total) by mouth once a week. 13 tablet 3   Saline (SIMPLY SALINE) 0.9 % AERS Place 2 each into the nose as directed. Use nightly for sinus hygiene long-term.  Can also be used as many times daily as desired to assist with clearing congested sinuses. 127 mL 11   TRUEplus Lancets 33G MISC USE TO CHECK SUGAR BEFORE MEALS 3 TIMES DAILY 300 each 3   Upadacitinib  ER (RINVOQ ) 15 MG TB24 Take 1 tablet (15 mg total) by mouth daily. 30 tablet 11   Alcohol  Swabs  (B-D SINGLE USE SWABS  REGULAR) PADS USE AS DIRECTED OR NEEDED 300 each 12   amLODipine  (NORVASC ) 5 MG tablet TAKE 1 TABLET AT BEDTIME 90 tablet 3   No current facility-administered medications for this visit.    Allergies-reviewed and updated Allergies  Allergen Reactions   Humira  [Adalimumab ] Rash    psoriasis   Mercaptopurine  Other (See Comments)    Caused Pancreatitis    Social History   Social History Narrative   Single never married- long term girlfriend over 30 years in 2025. 1 son 46 in 30. 1 grandson 1 in 2025. Lives in [place]. Helped raise girlfriends children- one living daughter- 1 grandchild. One other grandchild from deceased daughter. 3 total grandkids and 1 greatgrand and another one on the way   Lives alone      Disability- low back issues   Roll up machine operator      Hobbies: enjoys tinkering in his shed, enjoys going to the gym   Objective  Objective:  BP 126/70   Pulse 97   Temp (!) 97.4 F (36.3 C)   Ht 5' 10 (1.778 m)   Wt 231 lb 6.4 oz (105 kg)   SpO2 96%   BMI 33.20 kg/m  Gen: NAD, resting comfortably HEENT: Mucous membranes are moist. Oropharynx normal Neck: no thyromegaly CV: RRR no murmurs rubs or gallops Lungs: CTAB no crackles, wheeze, rhonchi Abdomen: soft/nontender/nondistended/normal bowel sounds. No rebound or guarding.  Ext: trace edema Skin: warm,  dry Neuro: grossly normal, moves all extremities, PERRLA Tender over medial ankle on left, antalgic gait   Assessment and Plan  68 y.o. male presenting for annual physical.  Health Maintenance counseling: 1. Anticipatory guidance: Patient counseled regarding regular dental exams -q6 months- has to go to oral surgeon for tooth fracture removal,  eye exams -yearly,  avoiding smoking and second hand smoke , limiting alcohol  to 2 beverages per day -  trying to cut down from 5-6 a week for liver health, no illicit drugs .   2. Risk factor reduction:  Advised patient of need for regular exercise and diet rich and fruits and vegetables to reduce risk of heart attack and stroke.  Exercise- staying active but no regular intentional exercise but encouraged.  Diet/weight management-Down 6 pounds in the last year-congratulated efforts- trying to be active to help.  Wt Readings from Last 3 Encounters:  01/06/24 231 lb 6.4 oz (105 kg)  11/18/23 241 lb (109.3 kg)  09/30/23 249 lb (112.9 kg)  3. Immunizations/screenings/ancillary studies-advised fall COVID and flu shot-we do not have this available quite yet-he may hold off on flu shot as has not tolerated in the past Immunization History  Administered Date(s) Administered   Fluad Quad(high Dose 65+) 03/21/2020, 03/04/2021   Influenza Split 02/15/2011   Influenza, Seasonal, Injecte, Preservative Fre 05/21/2012   Influenza,inj,Quad PF,6+ Mos 06/03/2013   Moderna Covid-19 Vaccine Bivalent Booster 64yrs & up 04/19/2021   Moderna SARS-COV2 Booster Vaccination 11/16/2020   Moderna Sars-Covid-2 Vaccination 08/24/2019, 09/21/2019, 04/04/2020   PNEUMOCOCCAL CONJUGATE-20 07/01/2023   Td 06/04/2001   Tdap 05/21/2012, 03/09/2021   Zoster Recombinant(Shingrix) 04/25/2021, 06/28/2021  4. Prostate cancer screening-  prior MRI of the prostate 07/16/2020 and has followed with Dr. Watt.  Reports prior biopsy that was benign.  We will trend PSA with labs. Last visit  03/26/22  Lab Results  Component Value Date   PSA 1.19 12/28/2022   PSA 0.91 08/10/2021   PSA 0.76 09/17/2019   5. Colon cancer screening - February 2024 and may have repeat later this year-working with gastroenterology on this 6. Skin cancer screening-has seen dermatology in the past due to psoriasis from Humira -does not see dermatology now. advised regular sunscreen use. Denies worrisome, changing, or new skin lesions.  7. Smoking associated screening (lung cancer screening, AAA screen 65-75, UA)-former smoker-quit smoking in 2002.  No aneurysm on CT in 2017.  Recent urinalysis with urology and they are planning on PSA  8. STD screening - monogamous with long term girlfriend - over 30 years  Status of chronic or acute concerns   # [facility]- left ankle pain- woke up acutely with pain yesterday. No fall or injury. Offered to undress it and evaluate but he wants to go to urgent care. Advised [facility] and gave handout since he sees Dr. Duwayne -if this were gout would not want to check uric acid during acute flare- would do at later date  # Elevated LFTs-working with Dr. Vearl was ordered with changes suggesting steatosis but no advanced liver disease based on low median kPa.  Notes from Dr. Avram suggest Rinvoq  could be playing a role-thankfully only mild elevation.  Last year drinking only 5-6 beverages per week-encouraged further reduction or cessation. Also possible fatty liver on us  advised weight loss  Lab Results  Component Value Date   ALT 44 11/04/2023   AST 45 (H) 11/04/2023   ALKPHOS 22 (L) 11/04/2023   BILITOT 0.7 11/04/2023    #hypertension S: medication: amlodipine  5 mg, hctz 25 mg -takes potassium once a week BP Readings from Last 3 Encounters:  01/06/24 126/70  11/18/23 110/84  09/30/23 124/70  A/P: well controlled continue current medications   #hyperlipidemia S: Medication:Atorvastatin  40 mg weekly Lab Results   Component Value Date   CHOL 157 10/07/2023   HDL 46.70 10/07/2023   LDLCALC 96 10/07/2023   LDLDIRECT 64.0 12/08/2019   TRIG 76.0  10/07/2023   CHOLHDL 3 10/07/2023  A/P: reasonable control just 3 months ago- continue current medications   % Diabetes-follows with endocrinology now Dr. Trixie with A1c typically under 7 S: Medication:Metformin  1000 mg twice daily-takes B12 while on this Lab Results  Component Value Date   HGBA1C 5.9 (A) 09/30/2023   HGBA1C 7.0 (A) 06/24/2023   HGBA1C 6.2 (A) 12/17/2022  A/P: well controlled continue current medications  -I recommended also taking B12 since he is on Metformin - still taking  #Ulcerative colitis-follows with GI S: Medication: Lialda  2.4 g twice daily, now on rinvoq  as well . Some imodium -humira  lead to psoriasis -prior stelara  but still with flares A/P: improved control- continue current medications- they  will decide about next colonoscopy  #Vitamin D  deficiency S: Medication: 1000 units Last vitamin D  Lab Results  Component Value Date   VD25OH 52.17 12/28/2022  A/P: hopefully stable- update vitamin D  today. Continue current meds for now   # Hypogonadism-treatment through urology Dr. Watt being considered  but he opted out  #pulmonary nodules- monitored closely by Dr. Darlean most recently 06/06/23 with plan for 3-6 month repeat. Dr. Darlean says 6 months-encouraged patient to schedule office visit   Recommended follow up: Return in about 6 months (around 07/08/2024) for followup or sooner if needed.Schedule b4 you leave. Future Appointments  Date Time Provider Department Center  02/24/2024  9:00 AM [name], MD [facility] None  07/13/2024 10:00 AM [facility] ANNUAL WELLNESS VISIT 1 [facility]   Lab/Order associations: fasting   ICD-10-CM   1. Preventative health care  Z00.00     2. Type 2 diabetes mellitus with hyperglycemia, without long-term current use of insulin  (HCC)  E11.65     3. Universal ulcerative colitis with  complication (HCC)  K51.019     4. Hyperlipidemia associated with type 2 diabetes mellitus (HCC)  E11.69    E78.5     5. Essential hypertension  I10     6. Vitamin D  deficiency  E55.9     7. Screening for prostate cancer  Z12.5       No orders of the defined types were placed in this encounter.   Return precautions advised.  Garnette Lukes, MD

## 2024-01-06 NOTE — Patient Instructions (Addendum)
 Schedule follow-up with Dr. Darlean  [facility] today   Please stop by lab before you go If you have mychart- we will send your results within 3 business days of us  receiving them.  If you do not have mychart- we will call you about results within 5 business days of us  receiving them.  *please also note that you will see labs on mychart as soon as they post. I will later go in and write notes on them- will say notes from Dr. Katrinka   Recommended follow up: Return in about 6 months (around 07/08/2024) for followup or sooner if needed.Schedule b4 you leave.

## 2024-01-07 NOTE — Telephone Encounter (Signed)
 Patient returning phone call. Patient stated he is unable to complete stool sample due to ankle injury. States he will not be able to complete for about 1-2 weeks. Please advise, thank you.

## 2024-01-08 ENCOUNTER — Telehealth: Payer: Self-pay | Admitting: Family Medicine

## 2024-01-08 NOTE — Telephone Encounter (Signed)
 Spoke with patient. Noted in result notes.

## 2024-01-08 NOTE — Telephone Encounter (Signed)
Patient returned call. Requests to be called. 

## 2024-01-20 DIAGNOSIS — M7022 Olecranon bursitis, left elbow: Secondary | ICD-10-CM | POA: Diagnosis not present

## 2024-01-20 DIAGNOSIS — M542 Cervicalgia: Secondary | ICD-10-CM | POA: Diagnosis not present

## 2024-01-20 DIAGNOSIS — M25561 Pain in right knee: Secondary | ICD-10-CM | POA: Diagnosis not present

## 2024-01-20 DIAGNOSIS — M19072 Primary osteoarthritis, left ankle and foot: Secondary | ICD-10-CM | POA: Diagnosis not present

## 2024-01-31 DIAGNOSIS — M25562 Pain in left knee: Secondary | ICD-10-CM | POA: Diagnosis not present

## 2024-02-04 ENCOUNTER — Other Ambulatory Visit (INDEPENDENT_AMBULATORY_CARE_PROVIDER_SITE_OTHER)

## 2024-02-04 DIAGNOSIS — K51019 Ulcerative (chronic) pancolitis with unspecified complications: Secondary | ICD-10-CM | POA: Diagnosis not present

## 2024-02-04 DIAGNOSIS — M7022 Olecranon bursitis, left elbow: Secondary | ICD-10-CM | POA: Diagnosis not present

## 2024-02-09 LAB — CALPROTECTIN: Calprotectin: 775 ug/g — ABNORMAL HIGH

## 2024-02-10 ENCOUNTER — Ambulatory Visit: Payer: Self-pay | Admitting: Internal Medicine

## 2024-02-11 ENCOUNTER — Ambulatory Visit: Admitting: Family Medicine

## 2024-02-11 ENCOUNTER — Encounter: Payer: Self-pay | Admitting: Family Medicine

## 2024-02-11 ENCOUNTER — Other Ambulatory Visit

## 2024-02-11 VITALS — BP 113/79 | HR 87 | Temp 98.0°F | Resp 16 | Ht 70.0 in | Wt 226.0 lb

## 2024-02-11 DIAGNOSIS — E1165 Type 2 diabetes mellitus with hyperglycemia: Secondary | ICD-10-CM | POA: Diagnosis not present

## 2024-02-11 DIAGNOSIS — I10 Essential (primary) hypertension: Secondary | ICD-10-CM | POA: Diagnosis not present

## 2024-02-11 DIAGNOSIS — R634 Abnormal weight loss: Secondary | ICD-10-CM

## 2024-02-11 DIAGNOSIS — K51019 Ulcerative (chronic) pancolitis with unspecified complications: Secondary | ICD-10-CM | POA: Diagnosis not present

## 2024-02-11 DIAGNOSIS — M1712 Unilateral primary osteoarthritis, left knee: Secondary | ICD-10-CM | POA: Diagnosis not present

## 2024-02-11 DIAGNOSIS — M19022 Primary osteoarthritis, left elbow: Secondary | ICD-10-CM

## 2024-02-11 DIAGNOSIS — M19072 Primary osteoarthritis, left ankle and foot: Secondary | ICD-10-CM | POA: Diagnosis not present

## 2024-02-11 DIAGNOSIS — M130 Polyarthritis, unspecified: Secondary | ICD-10-CM | POA: Diagnosis not present

## 2024-02-11 DIAGNOSIS — E559 Vitamin D deficiency, unspecified: Secondary | ICD-10-CM | POA: Diagnosis not present

## 2024-02-11 LAB — CBC WITH DIFFERENTIAL/PLATELET
Basophils Absolute: 0 K/uL (ref 0.0–0.1)
Basophils Relative: 0.2 % (ref 0.0–3.0)
Eosinophils Absolute: 0.1 K/uL (ref 0.0–0.7)
Eosinophils Relative: 1.9 % (ref 0.0–5.0)
HCT: 34.3 % — ABNORMAL LOW (ref 39.0–52.0)
Hemoglobin: 11.6 g/dL — ABNORMAL LOW (ref 13.0–17.0)
Lymphocytes Relative: 23.9 % (ref 12.0–46.0)
Lymphs Abs: 1.3 K/uL (ref 0.7–4.0)
MCHC: 33.9 g/dL (ref 30.0–36.0)
MCV: 93.5 fl (ref 78.0–100.0)
Monocytes Absolute: 0.5 K/uL (ref 0.1–1.0)
Monocytes Relative: 9.9 % (ref 3.0–12.0)
Neutro Abs: 3.6 K/uL (ref 1.4–7.7)
Neutrophils Relative %: 64.1 % (ref 43.0–77.0)
Platelets: 290 K/uL (ref 150.0–400.0)
RBC: 3.67 Mil/uL — ABNORMAL LOW (ref 4.22–5.81)
RDW: 12.8 % (ref 11.5–15.5)
WBC: 5.6 K/uL (ref 4.0–10.5)

## 2024-02-11 LAB — SEDIMENTATION RATE: Sed Rate: 57 mm/h — ABNORMAL HIGH (ref 0–20)

## 2024-02-11 LAB — HEMOGLOBIN A1C: Hgb A1c MFr Bld: 7.6 % — ABNORMAL HIGH (ref 4.6–6.5)

## 2024-02-11 NOTE — Telephone Encounter (Signed)
 Inbound call from pt returning a call back to [place]. Patient is requesting a call back. Please advise.

## 2024-02-11 NOTE — Patient Instructions (Signed)
 Weight weekly  Call Dr. Avram back

## 2024-02-11 NOTE — Progress Notes (Signed)
 Subjective:     Patient ID: Angel Frazier, male    DOB: 1956/05/23, 68 y.o.   MRN: [id number]  Chief Complaint  Patient presents with   Follow-up    Follow-up from ortho visit, was told to see pcp concerning possible arthritis Concerned with losing weight for the last couple of months    HPI 249 April  253 jan   238 1 yr ago and up to oct Discussed the use of AI scribe software for clinical note transcription with the patient, who gave verbal consent to proceed.  History of Present Illness Angel Frazier is a 68 year old male with arthritis who presents for evaluation of joint pain and weight loss.  He experiences joint pain and swelling in the left ankle, knee, and elbow. The symptoms began with swelling in the ankle, which worsened over the course of a day. Saw ortho. Initial treatment with prednisone  for a week did not alleviate the symptoms, followed by a corticosteroid injection in the knee. Despite these treatments, he continues to experience swelling, particularly in the elbow. Was told by ortho, needs to f/u here for eval of arthritis  He has a history of ulcerative colitis and is currently taking medication for it. A recent stool sample showed no bleeding or significant issues.  Over the past few months, he has experienced significant weight fluctuations, losing approximately 15 pounds in three months without intentional weight loss efforts. No gastrointestinal symptoms such as diarrhea, nausea, or vomiting. No changes in appetite or alcohol  and drug use. He monitors his blood sugar levels regularly, with recent readings around 113 mg/dL, and has an upcoming appointment with his diabetes specialist.no cp/palp, etc.   No known family history of rheumatoid arthritis or gout. He feels generally well aside from the joint issues.     Health Maintenance Due  Topic Date Due   Colonoscopy  07/10/2023    Past Medical History:  Diagnosis Date    vitamin D  deficiency 01/24/2013    01/23/13 - level 29 - supplements 50k IU weekly x 6 recommended    Anabolic steroid abuse    PT. DENIES 03/30/19   Arthritis    Avascular necrosis of femoral head (HCC)    bilateral   Bleeding hemorrhoids    Diabetes mellitus without complication (HCC) 2017   type 2   Drug-induced acute pancreatitis - 09/07/2015   Erectile dysfunction    Hypertension    Left sided ulcerative colitis (HCC)    Psoriasis caused by Humira  04/13/2019   Rhabdomyolysis 12/25/2018   Spinal stenosis of lumbar region at multiple levels     Past Surgical History:  Procedure Laterality Date   COLONOSCOPY     multiple   KNEE ARTHROSCOPY Right    PROSTATE BIOPSY  08/2020   Negative for cancer   SIGMOIDOSCOPY       Current Outpatient Medications:    Alcohol  Swabs  (B-D SINGLE USE SWABS  REGULAR) PADS, USE AS DIRECTED OR NEEDED, Disp: 300 each, Rfl: 12   amLODipine  (NORVASC ) 5 MG tablet, TAKE 1 TABLET AT BEDTIME, Disp: 90 tablet, Rfl: 3   Ascorbic Acid (VITAMIN C ) 1000 MG tablet, Take 1,000 mg by mouth daily. Reported on 09/28/2015, Disp: , Rfl:    aspirin  81 MG tablet, Take 81 mg by mouth daily. Reported on 09/28/2015, Disp: , Rfl:    atorvastatin  (LIPITOR) 40 MG tablet, TAKE 1 TABLET ONCE A WEEK., Disp: 13 tablet, Rfl: 3   Blood Glucose Calibration (TRUE METRIX  LEVEL 1) Low SOLN, Use as directed, Disp: 1 each, Rfl: 1   blood glucose meter kit and supplies, Dispense based on patient and insurance preference. Use up to four times daily as directed. (FOR ICD-9 250.00, 250.01)., Disp: 1 each, Rfl: 0   Blood Glucose Monitoring Suppl (TRUE METRIX METER) w/Device KIT, USE AS DIRECTED, Disp: 1 kit, Rfl: 1   Calcium  Carbonate-Vitamin D  (CALCIUM -VITAMIN D3 PO), Take 1 tablet by mouth daily. Reported on 09/28/2015, Disp: , Rfl:    Cyanocobalamin  (B-12 PO), 5000mg  daily, Disp: , Rfl:    glucose blood (TRUE METRIX BLOOD GLUCOSE TEST) test strip, Use as instructed to check blood sugar before meals., Disp: 300 each, Rfl: 12    hydrochlorothiazide  (HYDRODIURIL ) 25 MG tablet, TAKE 1 TABLET EVERY DAY, Disp: 90 tablet, Rfl: 3   LIALDA  1.2 g EC tablet, TAKE TWO TABLETS BY MOUTH TWICE A DAY, Disp: 360 tablet, Rfl: 2   loperamide (IMODIUM A-D) 2 MG tablet, Take 2 mg by mouth in the morning and at bedtime., Disp: , Rfl:    metFORMIN  (GLUCOPHAGE ) 1000 MG tablet, TAKE 1 TABLET(1000 MG) BY MOUTH TWICE DAILY WITH A MEAL, Disp: 180 tablet, Rfl: 3   Multiple Vitamins-Minerals (CENTRUM SILVER PO), Take 1 tablet by mouth daily. Reported on 09/28/2015, Disp: , Rfl:    Omega-3 Fatty Acids (FISH OIL) 300 MG CAPS, Take 300 mg by mouth daily., Disp: , Rfl:    potassium chloride  SA (KLOR-CON  M) 20 MEQ tablet, Take 1 tablet (20 mEq total) by mouth once a week., Disp: 13 tablet, Rfl: 3   Saline (SIMPLY SALINE) 0.9 % AERS, Place 2 each into the nose as directed. Use nightly for sinus hygiene long-term.  Can also be used as many times daily as desired to assist with clearing congested sinuses., Disp: 127 mL, Rfl: 11   TRUEplus Lancets 33G MISC, USE TO CHECK SUGAR BEFORE MEALS 3 TIMES DAILY, Disp: 300 each, Rfl: 3   Upadacitinib  ER (RINVOQ ) 15 MG TB24, Take 1 tablet (15 mg total) by mouth daily., Disp: 30 tablet, Rfl: 11  Allergies  Allergen Reactions   Adalimumab  Rash and Dermatitis    psoriasis  adalimumab    Mercaptopurine  Other (See Comments)    Caused Pancreatitis  mercaptopurine    ROS neg/noncontributory except as noted HPI/below      Objective:     BP 113/79   Pulse 87   Temp 98 F (36.7 C) (Temporal)   Resp 16   Ht 5' 10 (1.778 m)   Wt 226 lb (102.5 kg)   SpO2 97%   BMI 32.43 kg/m  Wt Readings from Last 3 Encounters:  02/11/24 226 lb (102.5 kg)  01/06/24 231 lb 6.4 oz (105 kg)  11/18/23 241 lb (109.3 kg)    Physical Exam   Gen: WDWN NAD HEENT: NCAT, conjunctiva not injected, sclera nonicteric NECK:  supple, no thyromegaly, no nodes, no carotid bruits CARDIAC: RRR, S1S2+, no murmur.  LUNGS: CTAB. No  wheezes ABDOMEN:  BS+, soft, NTND, No HSM, no masses EXT:  no edema MSK: no gross abnormalities.  NEURO: A&O x3.  CN II-XII intact.  PSYCH: normal mood. Good eye contact  Wearing L ankle brace. Some olecrenon swelling on L(has ace on)  Reviewed labs, ortho notes, GI notes  I personally spent a total of 35 minutes in the care of the patient today including preparing to see the patient, getting/reviewing separately obtained history, performing a medically appropriate exam/evaluation, counseling and educating, placing orders, documenting clinical information  in the EHR, and independently interpreting results.      Assessment & Plan:  Polyarthritis -     CBC with Differential/Platelet -     Comprehensive metabolic panel with GFR -     Rheumatoid factor -     Sedimentation rate -     Uric acid -     C-reactive protein  Weight loss -     CBC with Differential/Platelet -     Comprehensive metabolic panel with GFR -     TSH -     Hemoglobin A1c  Type 2 diabetes mellitus with hyperglycemia, without long-term current use of insulin  (HCC) -     Hemoglobin A1c  Universal ulcerative colitis with complication (HCC) -     IBC + Ferritin -     Vitamin B12  Assessment and Plan Assessment & Plan Polyarthritis involving left ankle, knee, and elbow   Polyarthritis affects his left ankle, knee, and elbow with persistent swelling, especially in the elbow. Previous treatments with prednisone  and an intra-articular injection have not significantly improved his condition. The cause of the arthritis is unclear, necessitating further evaluation to determine if it is rheumatoid arthritis, gout, or another type. Blood work will be ordered to evaluate for rheumatoid arthritis and other types of arthritis. He will follow up with an orthopedic specialist in two weeks for potential joint aspiration.  Unintentional weight loss   He has experienced unintentional weight loss of approximately 15 pounds over the  past three months, with significant fluctuations over the past year. He denies symptoms such as diarrhea, nausea, vomiting, or changes in appetite. The differential diagnosis includes uncontrolled diabetes, thyroid dysfunction, or other metabolic or systemic conditions. A recent blood sugar reading was 113 mg/dL. Blood work, including A1c and thyroid function tests, will be ordered. His weight will be monitored weekly until his follow-up with the endocrinologist on February 24, 2024.  Ulcerative pancolitis   He has ulcerative pancolitis with a recent stool sample showing increased markers of inflammation. He is asymptomatic with no bleeding, diarrhea, or abdominal pain. A colonoscopy is recommended to assess for potential complications such as colon cancer, given the increased risk associated with ulcerative colitis. A colonoscopy will be scheduled as recommended by the gastroenterologist, and the gastroenterologist will be contacted to discuss the recommendation.  Type 2 diabetes mellitus   He has type 2 diabetes mellitus with a recent blood sugar reading of 113 mg/dL. His last A1c was checked in April. An A1c test will be ordered to assess current glycemic control. He is scheduled to follow up with the endocrinologist on February 24, 2024.  Given wt loss, will check A1C    Return for keep appt 10/2 with Dr. Katrinka.  Jenkins CHRISTELLA Carrel, MD

## 2024-02-11 NOTE — Addendum Note (Signed)
 Addended by: Ivori Storr K on: 02/11/2024 02:50 PM   Modules accepted: Orders

## 2024-02-12 ENCOUNTER — Encounter: Payer: Self-pay | Admitting: Internal Medicine

## 2024-02-12 ENCOUNTER — Ambulatory Visit: Payer: Self-pay

## 2024-02-12 ENCOUNTER — Ambulatory Visit: Payer: Self-pay | Admitting: Family Medicine

## 2024-02-12 DIAGNOSIS — M130 Polyarthritis, unspecified: Secondary | ICD-10-CM

## 2024-02-12 LAB — COMPREHENSIVE METABOLIC PANEL WITH GFR
AG Ratio: 1.1 (calc) (ref 1.0–2.5)
ALT: 15 U/L (ref 9–46)
AST: 15 U/L (ref 10–35)
Albumin: 4 g/dL (ref 3.6–5.1)
Alkaline phosphatase (APISO): 56 U/L (ref 35–144)
BUN: 16 mg/dL (ref 7–25)
CO2: 28 mmol/L (ref 20–32)
Calcium: 9.3 mg/dL (ref 8.6–10.3)
Chloride: 97 mmol/L — ABNORMAL LOW (ref 98–110)
Creat: 0.91 mg/dL (ref 0.70–1.35)
Globulin: 3.5 g/dL (ref 1.9–3.7)
Glucose, Bld: 90 mg/dL (ref 65–99)
Potassium: 4.5 mmol/L (ref 3.5–5.3)
Sodium: 136 mmol/L (ref 135–146)
Total Bilirubin: 0.6 mg/dL (ref 0.2–1.2)
Total Protein: 7.5 g/dL (ref 6.1–8.1)
eGFR: 91 mL/min/1.73m2 (ref >=60)

## 2024-02-12 LAB — VITAMIN B12: Vitamin B-12: >2000 pg/mL — ABNORMAL HIGH (ref 200–1100)

## 2024-02-12 LAB — RHEUMATOID FACTOR: Rheumatoid fact SerPl-aCnc: 11 [IU]/mL (ref <14)

## 2024-02-12 LAB — TSH: TSH: 1.03 m[IU]/L (ref 0.40–4.50)

## 2024-02-12 LAB — IRON,TIBC AND FERRITIN PANEL
%SAT: 12 % — ABNORMAL LOW (ref 20–48)
Ferritin: 166 ng/mL (ref 24–380)
Iron: 31 ug/dL — ABNORMAL LOW (ref 50–180)
TIBC: 263 ug/dL (ref 250–425)

## 2024-02-12 LAB — URIC ACID: Uric Acid, Serum: 6.7 mg/dL (ref 4.0–8.0)

## 2024-02-12 LAB — C-REACTIVE PROTEIN: CRP: 43.5 mg/L — ABNORMAL HIGH (ref <8.0)

## 2024-02-12 NOTE — Progress Notes (Signed)
 Uric acid levels are mostly normal Rheumatoid negative Inflammatory markers are very high which could be an inflammatory arthritis or his ulcerative colitis-will tag GI and refer pt to rheumatology Iron is low and some anemia-will tag GI A1C is a bit high as well-sees Dr. Trixie on 9/22.

## 2024-02-12 NOTE — Telephone Encounter (Signed)
 Left message to return call to office.

## 2024-02-12 NOTE — Telephone Encounter (Signed)
 Copied from CRM [id number]. Topic: Clinical - Lab/Test Results >> Feb 12, 2024  2:13 PM Viola F wrote: Reason for CRM: I relayed lab results to patient and he has further question on what the labs mean Answer Assessment - Initial Assessment Questions Patient called about lab results. This nurse provided MD's recommendations: 1.  Uric acid levels are mostly normal 2. Rheumatoid negative 3. Inflammatory markers are very high which could be an inflammatory arthritis or his ulcerative colitis-will tag GI and refer pt to rheumatology 4. Iron is low and some anemia-will tag GI 5. A1C is a bit high as well-sees Dr. Trixie on 9/22.    Patient had question of Rheumatology, did not see referral or history of rheumatology on file. Does patient need referral?  Patient asked about labs that were sent out. Informed patient that once provider reviews labs and give interpretations, someone will contact him with the results via mychart or phone.  Protocols used: Information Only Call - No Triage-A-AH

## 2024-02-12 NOTE — Progress Notes (Signed)
 See other 1/2 of labs

## 2024-02-12 NOTE — Telephone Encounter (Signed)
 Left vm for pt requesting call back tomorrow regarding referral as I will be leaving the office at 3pm. Will go over this with patient during phone call.

## 2024-02-13 ENCOUNTER — Telehealth: Payer: Self-pay | Admitting: Family Medicine

## 2024-02-13 NOTE — Telephone Encounter (Signed)
 Spoke with patient and went over lab work and provided the number for rheumatology office. No further questions at this time.   Copied from CRM #[id number]. Topic: General - Other >> Feb 13, 2024  1:55 PM Angel Frazier wrote: Reason for CRM: Patient called regarding missed call. Called [facility] and was advised to send a CRM for a call back from [place].

## 2024-02-14 ENCOUNTER — Telehealth: Payer: Self-pay | Admitting: Family Medicine

## 2024-02-14 NOTE — Telephone Encounter (Signed)
 Please see patient message and advise.    Copied from CRM [id number]. Topic: Referral - Question >> Feb 14, 2024 10:57 AM Chiquita SQUIBB wrote: Reason for CRM: Patient is calling in to let Dr. Wendolyn know that the rheumatologist she referred him to can not get him in until November 18th. Patient is asking if he can be referred to a different office. Please advise the patient.

## 2024-02-24 ENCOUNTER — Encounter: Payer: Self-pay | Admitting: Internal Medicine

## 2024-02-24 ENCOUNTER — Ambulatory Visit: Admitting: Internal Medicine

## 2024-02-24 VITALS — BP 118/70 | HR 108 | Ht 70.0 in | Wt 222.2 lb

## 2024-02-24 DIAGNOSIS — E1165 Type 2 diabetes mellitus with hyperglycemia: Secondary | ICD-10-CM | POA: Diagnosis not present

## 2024-02-24 DIAGNOSIS — E785 Hyperlipidemia, unspecified: Secondary | ICD-10-CM

## 2024-02-24 DIAGNOSIS — E1169 Type 2 diabetes mellitus with other specified complication: Secondary | ICD-10-CM

## 2024-02-24 MED ORDER — METFORMIN HCL ER 500 MG PO TB24: 1000.0000 mg | ORAL_TABLET | Freq: Two times a day (BID) | ORAL | 3 refills | Status: AC

## 2024-02-24 NOTE — Progress Notes (Signed)
 Patient ID: Angel Frazier, male   DOB: 1955-08-29, 68 y.o.   MRN: [id number]  HPI: Angel Frazier is a 68 y.o.-year-old male, returning for follow-up for ketosis-prone diabetes, dx in 2017, non-insulin -dependent, controlled, without long-term complications. Pt. previously saw Dr. Kassie, but last visit with me 5 months ago.  Interim history: No increased urination, blurry vision, nausea, chest pain. He has diarrhea >> On Rinvoq , Imodium. He has a colonoscopy coming up.  He is seeing Dr. Avram. He has L knee pain >> had steroid injections >> sugars higher. He lost approximately 27 pounds since last visit.  Reviewed HbA1c: Lab Results  Component Value Date   HGBA1C 7.6 (H) 02/11/2024   HGBA1C 5.9 (A) 09/30/2023   HGBA1C 7.0 (A) 06/24/2023   HGBA1C 6.2 (A) 12/17/2022   HGBA1C 6.4 (A) 06/11/2022   HGBA1C 6.7 (H) 02/12/2022   HGBA1C 6.2 (A) 08/21/2021   HGBA1C 5.6 02/20/2021   HGBA1C 6.4 (A) 08/19/2020   HGBA1C 7.1 (H) 08/09/2020   Pt is on a regimen of: - Metformin  1000 mg 2x a day, with meals Rybelsus  was too expensive. He was on insulin  after diagnosis but then was able to come off.  Pt checks his sugars 1-3x a day and they are: - am: 100-110 >> 100-160 >> 110, 120-143, 150 >> 95-160, ave 114-120 - 2h after b'fast: n/c - before lunch: n/c - 2h after lunch: n/c - before dinner: 110-150 >> n/c >> 140-160 >>  n/c - 2h after dinner: n/c - bedtime: n/c  - nighttime: n/c Lowest sugar was 95 >> 100 >> 110 >> 95. Highest sugar was 160 >> 150 >> 160.  Glucometer: True Metrix  He saw nutrition in the past >> decreased carb intake afterwards.    - no CKD, last BUN/creatinine:  Lab Results  Component Value Date   BUN 16 02/11/2024   BUN 16 01/06/2024   CREATININE 0.91 02/11/2024   CREATININE 0.98 01/06/2024   Lab Results  Component Value Date   MICRALBCREAT 22.8 01/06/2024   MICRALBCREAT 13 09/30/2023   MICRALBCREAT 30 08/30/2016  He is not on ACE inhibitor/ARB.  -+ HL;  last set of lipids: Lab Results  Component Value Date   CHOL 157 10/07/2023   HDL 46.70 10/07/2023   LDLCALC 96 10/07/2023   LDLDIRECT 64.0 12/08/2019   TRIG 76.0 10/07/2023   CHOLHDL 3 10/07/2023  On Lipitor 40 mg daily, omega-3 fatty acids.  - last eye exam was 06/21/2023: No DR.   - no numbness and tingling in his feet.  Last foot exam 09/30/2023.  He has a history of ulcerative colitis, psoriasis, osteoarthritis, history of pancreatitis from 6-MP, spinal stenosis, vitamin D  deficiency.  ROS: + see HPI  Past Medical History:  Diagnosis Date    vitamin D  deficiency 01/24/2013   01/23/13 - level 29 - supplements 50k IU weekly x 6 recommended    Anabolic steroid abuse    PT. DENIES 03/30/19   Arthritis    Avascular necrosis of femoral head (HCC)    bilateral   Bleeding hemorrhoids    Diabetes mellitus without complication (HCC) 2017   type 2   Drug-induced acute pancreatitis - 09/07/2015   Erectile dysfunction    Hypertension    Left sided ulcerative colitis (HCC)    Psoriasis caused by Humira  04/13/2019   Rhabdomyolysis 12/25/2018   Spinal stenosis of lumbar region at multiple levels    Past Surgical History:  Procedure Laterality Date   COLONOSCOPY  multiple   KNEE ARTHROSCOPY Right    PROSTATE BIOPSY  08/2020   Negative for cancer   SIGMOIDOSCOPY     Social History   Socioeconomic History   Marital status: Single    Spouse name: Not on file   Number of children: 1   Years of education: Not on file   Highest education level: Not on file  Occupational History   Occupation: retired    Associate Professor: [employer]  Tobacco Use   Smoking status: Former    Current packs/day: 0.00    Average packs/day: 1 pack/day for 6.0 years (6.0 ttl pk-yrs)    Types: Cigarettes    Start date: 06/04/1994    Quit date: 06/04/2000    Years since quitting: 23.7   Smokeless tobacco: Never  Vaping Use   Vaping status: Never Used  Substance and Sexual Activity   Alcohol   use: Not Currently    Comment: occasionally   Drug use: No   Sexual activity: Not Currently  Other Topics Concern   Not on file  Social History Narrative   Single never married- long term girlfriend over 30 years in 2025. 1 son 85 in 57. 1 grandson 86 in 42. Lives in [place]. Helped raise girlfriends children- one living daughter- 1 grandchild. One other grandchild from deceased daughter. 3 total grandkids and 1 greatgrand and another one on the way   Lives alone      Disability- low back issues   Roll up machine operator      Hobbies: enjoys tinkering in his shed, enjoys going to the gym   Social Drivers of Health   Financial Resource Strain: Low Risk  (07/08/2023)   Overall Financial Resource Strain (CARDIA)    Difficulty of Paying Living Expenses: Not hard at all  Food Insecurity: No Food Insecurity (07/08/2023)   Hunger Vital Sign    Worried About Running Out of Food in the Last Year: Never true    Ran Out of Food in the Last Year: Never true  Transportation Needs: No Transportation Needs (07/08/2023)   PRAPARE - Administrator, Civil Service (Medical): No    Lack of Transportation (Non-Medical): No  Physical Activity: Sufficiently Active (07/08/2023)   Exercise Vital Sign    Days of Exercise per Week: 4 days    Minutes of Exercise per Session: 50 min  Stress: No Stress Concern Present (07/08/2023)   [employer] of Occupational Health - Occupational Stress Questionnaire    Feeling of Stress : Not at all  Social Connections: Moderately Isolated (07/08/2023)   Social Connection and Isolation Panel    Frequency of Communication with Friends and Family: More than three times a week    Frequency of Social Gatherings with Friends and Family: More than three times a week    Attends Religious Services: More than 4 times per year    Active Member of [employer] or Organizations: No    Attends Banker Meetings: Never    Marital Status: Never married  Intimate  Partner Violence: Not At Risk (07/08/2023)   Humiliation, Afraid, Rape, and Kick questionnaire    Fear of Current or Ex-Partner: No    Emotionally Abused: No    Physically Abused: No    Sexually Abused: No   Current Outpatient Medications on File Prior to Visit  Medication Sig Dispense Refill   Alcohol  Swabs  (B-D SINGLE USE SWABS  REGULAR) PADS USE AS DIRECTED OR NEEDED 300 each 12   amLODipine  (  NORVASC ) 5 MG tablet TAKE 1 TABLET AT BEDTIME 90 tablet 3   Ascorbic Acid (VITAMIN C ) 1000 MG tablet Take 1,000 mg by mouth daily. Reported on 09/28/2015     aspirin  81 MG tablet Take 81 mg by mouth daily. Reported on 09/28/2015     atorvastatin  (LIPITOR) 40 MG tablet TAKE 1 TABLET ONCE A WEEK. 13 tablet 3   Blood Glucose Calibration (TRUE METRIX LEVEL 1) Low SOLN Use as directed 1 each 1   blood glucose meter kit and supplies Dispense based on patient and insurance preference. Use up to four times daily as directed. (FOR ICD-9 250.00, 250.01). 1 each 0   Blood Glucose Monitoring Suppl (TRUE METRIX METER) w/Device KIT USE AS DIRECTED 1 kit 1   Calcium  Carbonate-Vitamin D  (CALCIUM -VITAMIN D3 PO) Take 1 tablet by mouth daily. Reported on 09/28/2015     Cyanocobalamin  (B-12 PO) 5000mg  daily     glucose blood (TRUE METRIX BLOOD GLUCOSE TEST) test strip Use as instructed to check blood sugar before meals. 300 each 12   hydrochlorothiazide  (HYDRODIURIL ) 25 MG tablet TAKE 1 TABLET EVERY DAY 90 tablet 3   LIALDA  1.2 g EC tablet TAKE TWO TABLETS BY MOUTH TWICE A DAY 360 tablet 2   loperamide (IMODIUM A-D) 2 MG tablet Take 2 mg by mouth in the morning and at bedtime.     metFORMIN  (GLUCOPHAGE ) 1000 MG tablet TAKE 1 TABLET(1000 MG) BY MOUTH TWICE DAILY WITH A MEAL 180 tablet 3   Multiple Vitamins-Minerals (CENTRUM SILVER PO) Take 1 tablet by mouth daily. Reported on 09/28/2015     Omega-3 Fatty Acids (FISH OIL) 300 MG CAPS Take 300 mg by mouth daily.     potassium chloride  SA (KLOR-CON  M) 20 MEQ tablet Take 1 tablet  (20 mEq total) by mouth once a week. 13 tablet 3   Saline (SIMPLY SALINE) 0.9 % AERS Place 2 each into the nose as directed. Use nightly for sinus hygiene long-term.  Can also be used as many times daily as desired to assist with clearing congested sinuses. 127 mL 11   TRUEplus Lancets 33G MISC USE TO CHECK SUGAR BEFORE MEALS 3 TIMES DAILY 300 each 3   Upadacitinib  ER (RINVOQ ) 15 MG TB24 Take 1 tablet (15 mg total) by mouth daily. 30 tablet 11   No current facility-administered medications on file prior to visit.   Allergies  Allergen Reactions   Adalimumab  Rash and Dermatitis    psoriasis  adalimumab    Mercaptopurine  Other (See Comments)    Caused Pancreatitis  mercaptopurine    Family History  Problem Relation Age of Onset   Diabetes Mother    Breast cancer Sister    Diabetes Other        cousin   Hypertension Other    Diabetes type I Son    Colon cancer Neg Hx    Esophageal cancer Neg Hx    Rectal cancer Neg Hx    Stomach cancer Neg Hx    Colon polyps Neg Hx    PE: BP 118/70   Pulse (!) 108   Ht 5' 10 (1.778 m)   Wt 222 lb 3.2 oz (100.8 kg)   SpO2 94%   BMI 31.88 kg/m  Wt Readings from Last 10 Encounters:  02/24/24 222 lb 3.2 oz (100.8 kg)  02/11/24 226 lb (102.5 kg)  01/06/24 231 lb 6.4 oz (105 kg)  11/18/23 241 lb (109.3 kg)  09/30/23 249 lb (112.9 kg)  09/02/23 257 lb (116.6 kg)  07/08/23 255 lb 12.8 oz (116 kg)  07/01/23 253 lb (114.8 kg)  06/24/23 265 lb 6.4 oz (120.4 kg)  05/10/23 248 lb 6 oz (112.7 kg)   Constitutional: overweight, in NAD Eyes:  EOMI, no exophthalmos ENT: no neck masses, no cervical lymphadenopathy Cardiovascular: tachycardia, RR, No MRG Respiratory: CTA B Musculoskeletal: no deformities Skin:no rashes Neurological: + mild tremor with outstretched hands  ASSESSMENT: 1.  Ketosis-prone diabetes (KPD), non-insulin -dependent, controlled, without long-term complications, but with hyperglycemia - h/o DKA - per Dr. Laymond notes -  ED -unknown if related to diabetes.  2. HL  PLAN:  1. Patient with longstanding, uncontrolled, type 2 diabetes, on oral antidiabetic regimen with metformin  only, previously with good control but with the latest HbA1c higher, at 7.6%, 2 weeks ago.  At our last visit, sugars were at or slightly above goal in the morning and he was not checking later in the day.  We discussed about the importance of checking some sugars at different times of the day but since HbA1c was lower, at 5.9%, I did not suggest a change in regimen.  I refilled his metformin . - At today's visit, sugars in the morning are mostly at goal but he is still not checking later in the day.  I again advised him to try to check some sugars before and after dinner. -Since last visit, he had steroid injections and he feels that this is the reason for his elevated HbA1c.  I agree, but we also need more blood sugars, to make sure that the sugars are not persistently elevated especially later in the day, now that he is off injections. -He also has diarrhea, and is treated with several agents.  He does not feel that this is related to metformin  but at today's visit I suggested to switch to the extended release formulation. - I suggested to:  Patient Instructions  Please change: - Metformin  ER 1000 mg 2x a day, with meals  Check some sugars later in the day.  Please return in 4 months.  - advised to check sugars at different times of the day - 1x a day, rotating check times - advised for yearly eye exams >> he is UTD - return to clinic in 4 months  2. HL - Latest lipid panel showed fractions at goal with the exception of an LDL above our target of less than 70: Lab Results  Component Value Date   CHOL 157 10/07/2023   HDL 46.70 10/07/2023   LDLCALC 96 10/07/2023   LDLDIRECT 64.0 12/08/2019   TRIG 76.0 10/07/2023   CHOLHDL 3 10/07/2023  - He continues on Lipitor 40 mg daily and omega-3 fatty acids, without side effects  Lela Fendt, MD PhD [facility] Endocrinology

## 2024-02-24 NOTE — Patient Instructions (Addendum)
 Please change: - Metformin  ER 1000 mg 2x a day, with meals  Check some sugars later in the day.  Please return in 4 months.

## 2024-02-25 ENCOUNTER — Encounter (HOSPITAL_COMMUNITY): Payer: Self-pay

## 2024-02-25 ENCOUNTER — Ambulatory Visit (HOSPITAL_COMMUNITY)
Admission: EM | Admit: 2024-02-25 | Discharge: 2024-02-25 | Disposition: A | Discharge status: Home / Self Care | Attending: Family Medicine | Admitting: Family Medicine

## 2024-02-25 DIAGNOSIS — R634 Abnormal weight loss: Secondary | ICD-10-CM | POA: Diagnosis not present

## 2024-02-25 DIAGNOSIS — R42 Dizziness and giddiness: Secondary | ICD-10-CM | POA: Diagnosis not present

## 2024-02-25 DIAGNOSIS — D649 Anemia, unspecified: Secondary | ICD-10-CM | POA: Diagnosis not present

## 2024-02-25 LAB — CBC WITH DIFFERENTIAL/PLATELET
Abs Immature Granulocytes: 0.05 K/uL (ref 0.00–0.07)
Basophils Absolute: 0 K/uL (ref 0.0–0.1)
Basophils Relative: 0 %
Eosinophils Absolute: 0.2 K/uL (ref 0.0–0.5)
Eosinophils Relative: 2 %
HCT: 35.5 % — ABNORMAL LOW (ref 39.0–52.0)
Hemoglobin: 11.9 g/dL — ABNORMAL LOW (ref 13.0–17.0)
Immature Granulocytes: 1 %
Lymphocytes Relative: 20 %
Lymphs Abs: 1.3 K/uL (ref 0.7–4.0)
MCH: 30.7 pg (ref 26.0–34.0)
MCHC: 33.5 g/dL (ref 30.0–36.0)
MCV: 91.7 fL (ref 80.0–100.0)
Monocytes Absolute: 0.6 K/uL (ref 0.1–1.0)
Monocytes Relative: 9 %
Neutro Abs: 4.5 K/uL (ref 1.7–7.7)
Neutrophils Relative %: 68 %
Platelets: 334 K/uL (ref 150–400)
RBC: 3.87 MIL/uL — ABNORMAL LOW (ref 4.22–5.81)
RDW: 12.3 % (ref 11.5–15.5)
WBC: 6.6 K/uL (ref 4.0–10.5)
nRBC: 0 % (ref 0.0–0.2)

## 2024-02-25 LAB — POC COVID19/FLU A&B COMBO
Covid Antigen, POC: NEGATIVE
Influenza A Antigen, POC: NEGATIVE
Influenza B Antigen, POC: NEGATIVE

## 2024-02-25 LAB — POCT FASTING CBG KUC MANUAL ENTRY: POCT Glucose (KUC): 87 mg/dL (ref 70–99)

## 2024-02-25 NOTE — ED Provider Notes (Signed)
 [facility]    CSN: [id number] Arrival date & time: 02/25/24  9073      History   Chief Complaint Chief Complaint  Patient presents with   Dizziness    HPI Angel Frazier is a 68 y.o. male.    Dizziness  Patient is here for not feeling well today.  He is feeling sweaty, dizzy.   No runny nose, congestion, drainage. No cough or sob or wheezing.  No n/v.  No diarrhea or constipation.  No urinary symptoms.  He describes the dizziness as light headedness.  Has had hot flashes off/on for a while.  No headache.  No blurry vision.  No chest pain.  No changes to diet, no changes to medications.  He has been dealing with arthritis since August.  He has gotten several steroid injections.  He did have blood work done 9/9.  Was told to see a rheumatologist.  He has also had some weight loss.  About 40 lbs sine January.   He does have an apt with GI for colonoscopy due to elevated calprotectin      Past Medical History:  Diagnosis Date    vitamin D  deficiency 01/24/2013   01/23/13 - level 29 - supplements 50k IU weekly x 6 recommended    Anabolic steroid abuse    PT. DENIES 03/30/19   Arthritis    Avascular necrosis of femoral head (HCC)    bilateral   Bleeding hemorrhoids    Diabetes mellitus without complication (HCC) 2017   type 2   Drug-induced acute pancreatitis - 09/07/2015   Erectile dysfunction    Hypertension    Left sided ulcerative colitis (HCC)    Psoriasis caused by Humira  04/13/2019   Rhabdomyolysis 12/25/2018   Spinal stenosis of lumbar region at multiple levels     Patient Active Problem List   Diagnosis Date Noted   Metabolic dysfunction-associated fatty liver disease (MAFLD) suspected 11/18/2023   Former smoker 09/17/2019   Psoriasis caused by Humira  04/13/2019   Hyperlipidemia associated with type 2 diabetes mellitus (HCC) 01/20/2019   Essential hypertension 01/20/2019   Type 2 diabetes mellitus with hyperglycemia, without long-term  current use of insulin  (HCC) 12/22/2018   Pulmonary nodules 09/07/2015   Long-term use of immunosuppressant medication Stelara  07/12/2015   Long term current use of systemic steroids 11/18/2014   History of avascular necrosis  11/18/2014   Vitamin D  deficiency 01/24/2013   ED (erectile dysfunction) 05/21/2012   Universal ulcerative colitis (HCC) 08/11/2008    Past Surgical History:  Procedure Laterality Date   COLONOSCOPY     multiple   KNEE ARTHROSCOPY Right    PROSTATE BIOPSY  08/2020   Negative for cancer   SIGMOIDOSCOPY         Home Medications    Prior to Admission medications   Medication Sig Start Date End Date Taking? Authorizing Provider  Alcohol  Swabs  (B-D SINGLE USE SWABS  REGULAR) PADS USE AS DIRECTED OR NEEDED 09/27/22   [name], MD  amLODipine  (NORVASC ) 5 MG tablet TAKE 1 TABLET AT BEDTIME 08/05/23   [name], MD  Ascorbic Acid (VITAMIN C ) 1000 MG tablet Take 1,000 mg by mouth daily. Reported on 09/28/2015    [provider]  aspirin  81 MG tablet Take 81 mg by mouth daily. Reported on 09/28/2015    [provider]  atorvastatin  (LIPITOR) 40 MG tablet TAKE 1 TABLET ONCE A WEEK. 05/22/23   [name], MD  Blood Glucose Calibration (  TRUE METRIX LEVEL 1) Low SOLN Use as directed 03/21/20   [name], MD  blood glucose meter kit and supplies Dispense based on patient and insurance preference. Use up to four times daily as directed. (FOR ICD-9 250.00, 250.01). 04/09/17   [name], MD  Blood Glucose Monitoring Suppl (TRUE METRIX METER) w/Device KIT USE AS DIRECTED 11/25/20   [name], MD  Calcium  Carbonate-Vitamin D  (CALCIUM -VITAMIN D3 PO) Take 1 tablet by mouth daily. Reported on 09/28/2015    [provider]  Cyanocobalamin  (B-12 PO) 5000mg  daily    [provider]  glucose blood (TRUE METRIX BLOOD GLUCOSE TEST) test strip Use as instructed to check blood sugar before meals. 03/14/22   [name], MD  hydrochlorothiazide  (HYDRODIURIL ) 25 MG tablet TAKE 1 TABLET EVERY DAY 10/01/23   [name], MD  LIALDA  1.2 g EC tablet TAKE TWO TABLETS BY MOUTH TWICE A DAY 07/02/23   [name], MD  loperamide (IMODIUM A-D) 2 MG tablet Take 2 mg by mouth in the morning and at bedtime.    [provider]  metFORMIN  (GLUCOPHAGE -XR) 500 MG 24 hr tablet Take 2 tablets (1,000 mg total) by mouth 2 (two) times daily with a meal. 02/24/24   [name], MD  Multiple Vitamins-Minerals (CENTRUM SILVER PO) Take 1 tablet by mouth daily. Reported on 09/28/2015    [provider]  Omega-3 Fatty Acids (FISH OIL) 300 MG CAPS Take 300 mg by mouth daily.    [provider]  potassium chloride  SA (KLOR-CON  M) 20 MEQ tablet Take 1 tablet (20 mEq total) by mouth once a week. 07/03/23   [name], MD  Saline (SIMPLY SALINE) 0.9 % AERS Place 2 each into the nose as directed. Use nightly for sinus hygiene long-term.  Can also be used as many times daily as desired to assist with clearing congested sinuses. 06/19/22   Jesus Bernardino MATSU, MD  TRUEplus Lancets 33G MISC USE TO CHECK SUGAR BEFORE MEALS 3 TIMES DAILY 09/25/22   [name], MD  Upadacitinib  ER (RINVOQ ) 15 MG TB24 Take 1 tablet (15 mg total) by mouth daily. 05/10/23   [name], MD    Family History Family History  Problem Relation Age of Onset   Diabetes Mother    Breast cancer Sister    Diabetes Other        cousin   Hypertension Other    Diabetes type I Son    Colon cancer Neg Hx    Esophageal cancer Neg Hx    Rectal cancer Neg Hx    Stomach cancer Neg Hx    Colon polyps Neg Hx     Social History Social History   Tobacco Use   Smoking status: Former    Current packs/day: 0.00    Average packs/day: 1 pack/day for 6.0 years (6.0 ttl pk-yrs)    Types: Cigarettes    Start date: 06/04/1994    Quit date: 06/04/2000    Years since quitting: 23.7   Smokeless tobacco: Never  Vaping Use    Vaping status: Never Used  Substance Use Topics   Alcohol  use: Not Currently    Comment: occasionally   Drug use: No     Allergies   Adalimumab  and Mercaptopurine    Review of Systems Review of Systems  Constitutional:  Positive for unexpected weight change.  HENT: Negative.    Respiratory: Negative.    Cardiovascular: Negative.   Gastrointestinal: Negative.   Genitourinary: Negative.  Musculoskeletal: Negative.   Neurological:  Positive for dizziness.     Physical Exam Triage Vital Signs ED Triage Vitals  Encounter Vitals Group     BP 02/25/24 0940 116/76     Girls Systolic BP Percentile --      Girls Diastolic BP Percentile --      Boys Systolic BP Percentile --      Boys Diastolic BP Percentile --      Pulse Rate 02/25/24 0940 (!) 101     Resp 02/25/24 0940 18     Temp 02/25/24 0940 98.7 F (37.1 C)     Temp Source 02/25/24 0940 Oral     SpO2 02/25/24 0940 96 %     Weight --      Height --      Head Circumference --      Peak Flow --      Pain Score 02/25/24 0939 0     Pain Loc --      Pain Education --      Exclude from Growth Chart --    No data found.  Updated Vital Signs BP 116/76 (BP Location: Left Arm)   Pulse (!) 101   Temp 98.7 F (37.1 C) (Oral)   Resp 18   SpO2 96%   Visual Acuity Right Eye Distance:   Left Eye Distance:   Bilateral Distance:    Right Eye Near:   Left Eye Near:    Bilateral Near:     Physical Exam Constitutional:      General: He is not in acute distress.    Appearance: Normal appearance. He is normal weight. He is not ill-appearing or toxic-appearing.  HENT:     Nose: Nose normal.     Mouth/Throat:     Mouth: Mucous membranes are moist.  Cardiovascular:     Rate and Rhythm: Normal rate and regular rhythm.  Pulmonary:     Effort: Pulmonary effort is normal.     Breath sounds: Normal breath sounds.  Abdominal:     Palpations: Abdomen is soft.     Tenderness: There is no abdominal tenderness. There is no  guarding or rebound.  Musculoskeletal:     Cervical back: Normal range of motion. No tenderness.  Lymphadenopathy:     Cervical: No cervical adenopathy.  Skin:    General: Skin is warm.  Neurological:     General: No focal deficit present.     Mental Status: He is alert and oriented to person, place, and time.     Cranial Nerves: No cranial nerve deficit.     Sensory: No sensory deficit.     Motor: No weakness.     Gait: Gait normal.  Psychiatric:        Mood and Affect: Mood normal.        Behavior: Behavior normal.      UC Treatments / Results  Labs (all labs ordered are listed, but only abnormal results are displayed) Labs Reviewed  CBC WITH DIFFERENTIAL/PLATELET  POC COVID19/FLU A&B COMBO  POCT FASTING CBG [facility] MANUAL ENTRY   CBG 87  EKG NSR;  LVH;  otherwise normal EKG;  no changes from previous  Radiology No results found.  Procedures Procedures (including critical care time)  Medications Ordered in UC Medications - No data to display  Initial Impression / Assessment and Plan / UC Course  I have reviewed the triage vital signs and the nursing notes.  Pertinent labs & imaging results that were available  during my care of the patient were reviewed by me and considered in my medical decision making (see chart for details).  Final Clinical Impressions(s) / UC Diagnoses   Final diagnoses:  Dizziness  Loss of weight  Anemia, unspecified type     Discharge Instructions      You were seen today for dizziness and weight loss.  Your flu/covid swab was negative.  Your blood sugar was normal.  Your EKG appears unchanged from previous.  As this time I have ordered blood work for further evaluation.  This should be resulted later today and you will be notified of results with further instructions.  If you have worsening dizziness or other symptoms then go to the ER for evaluation.  Please follow up with your primary care provider in terms of your continued  weight loss, as well as GI in term of possibly moving up your colonoscopy.     ED Prescriptions   None    PDMP not reviewed this encounter.   Darral Longs, MD 02/25/24 [phone]

## 2024-02-25 NOTE — Discharge Instructions (Signed)
 You were seen today for dizziness and weight loss.  Your flu/covid swab was negative.  Your blood sugar was normal.  Your EKG appears unchanged from previous.  As this time I have ordered blood work for further evaluation.  This should be resulted later today and you will be notified of results with further instructions.  If you have worsening dizziness or other symptoms then go to the ER for evaluation.  Please follow up with your primary care provider in terms of your continued weight loss, as well as GI in term of possibly moving up your colonoscopy.

## 2024-02-25 NOTE — ED Triage Notes (Signed)
 Pt states woke up this morning with dizziness, weakness, and sweaty. Pt diaphoretic during triage. Denies CP or SOB.

## 2024-03-02 DIAGNOSIS — M19072 Primary osteoarthritis, left ankle and foot: Secondary | ICD-10-CM | POA: Diagnosis not present

## 2024-03-05 ENCOUNTER — Encounter: Payer: Self-pay | Admitting: Family Medicine

## 2024-03-05 ENCOUNTER — Ambulatory Visit: Admitting: Family Medicine

## 2024-03-05 VITALS — BP 130/88 | HR 94 | Temp 97.4°F | Ht 70.0 in | Wt 227.8 lb

## 2024-03-05 DIAGNOSIS — K51019 Ulcerative (chronic) pancolitis with unspecified complications: Secondary | ICD-10-CM | POA: Diagnosis not present

## 2024-03-05 DIAGNOSIS — R918 Other nonspecific abnormal finding of lung field: Secondary | ICD-10-CM

## 2024-03-05 DIAGNOSIS — R634 Abnormal weight loss: Secondary | ICD-10-CM | POA: Diagnosis not present

## 2024-03-05 DIAGNOSIS — E1165 Type 2 diabetes mellitus with hyperglycemia: Secondary | ICD-10-CM

## 2024-03-05 DIAGNOSIS — Z7984 Long term (current) use of oral hypoglycemic drugs: Secondary | ICD-10-CM | POA: Diagnosis not present

## 2024-03-05 DIAGNOSIS — I10 Essential (primary) hypertension: Secondary | ICD-10-CM | POA: Diagnosis not present

## 2024-03-05 NOTE — Patient Instructions (Addendum)
 Urgent chest CT- team would love to get this done this week or early next week- please let him know how he will be contacted  No changes in medicine for now- depending on results may do more bloodwork but you have had a lot of bloodwork recently- mild anemia but colonoscopy upcomgin to evaluation   Recommended follow up: Return for next already scheduled visit or sooner if needed.

## 2024-03-05 NOTE — Progress Notes (Signed)
 Phone [phone] In person visit   Subjective:   Angel Frazier is a 68 y.o. year old very pleasant male patient who presents for/with See problem oriented charting Chief Complaint  Patient presents with   unintentional weightloss    Colonoscopy scheduled for 03/19/2024.    Past Medical History-  Patient Active Problem List   Diagnosis Date Noted   Type 2 diabetes mellitus with hyperglycemia, without long-term current use of insulin  (HCC) 12/22/2018    Priority: High   Long-term use of immunosuppressant medication Stelara  07/12/2015    Priority: High   Universal ulcerative colitis (HCC) 08/11/2008    Priority: High   Hyperlipidemia associated with type 2 diabetes mellitus (HCC) 01/20/2019    Priority: Medium    Essential hypertension 01/20/2019    Priority: Medium    Pulmonary nodules 09/07/2015    Priority: Medium    Former smoker 09/17/2019    Priority: Low   Long term current use of systemic steroids 11/18/2014    Priority: Low   History of avascular necrosis  11/18/2014    Priority: Low   Vitamin D  deficiency 01/24/2013    Priority: Low   ED (erectile dysfunction) 05/21/2012    Priority: Low   Metabolic dysfunction-associated fatty liver disease (MAFLD) suspected 11/18/2023   Psoriasis caused by Humira  04/13/2019    Medications- reviewed and updated Current Outpatient Medications  Medication Sig Dispense Refill   Alcohol  Swabs  (B-D SINGLE USE SWABS  REGULAR) PADS USE AS DIRECTED OR NEEDED 300 each 12   amLODipine  (NORVASC ) 5 MG tablet TAKE 1 TABLET AT BEDTIME 90 tablet 3   Ascorbic Acid (VITAMIN C ) 1000 MG tablet Take 1,000 mg by mouth daily. Reported on 09/28/2015     aspirin  81 MG tablet Take 81 mg by mouth daily. Reported on 09/28/2015     atorvastatin  (LIPITOR) 40 MG tablet TAKE 1 TABLET ONCE A WEEK. 13 tablet 3   Blood Glucose Calibration (TRUE METRIX LEVEL 1) Low SOLN Use as directed 1 each 1   blood glucose meter kit and supplies Dispense based on patient and  insurance preference. Use up to four times daily as directed. (FOR ICD-9 250.00, 250.01). 1 each 0   Blood Glucose Monitoring Suppl (TRUE METRIX METER) w/Device KIT USE AS DIRECTED 1 kit 1   Calcium  Carbonate-Vitamin D  (CALCIUM -VITAMIN D3 PO) Take 1 tablet by mouth daily. Reported on 09/28/2015     Cyanocobalamin  (B-12 PO) 5000mg  daily     glucose blood (TRUE METRIX BLOOD GLUCOSE TEST) test strip Use as instructed to check blood sugar before meals. 300 each 12   hydrochlorothiazide  (HYDRODIURIL ) 25 MG tablet TAKE 1 TABLET EVERY DAY 90 tablet 3   LIALDA  1.2 g EC tablet TAKE TWO TABLETS BY MOUTH TWICE A DAY 360 tablet 2   loperamide (IMODIUM A-D) 2 MG tablet Take 2 mg by mouth in the morning and at bedtime.     metFORMIN  (GLUCOPHAGE -XR) 500 MG 24 hr tablet Take 2 tablets (1,000 mg total) by mouth 2 (two) times daily with a meal. 360 tablet 3   Multiple Vitamins-Minerals (CENTRUM SILVER PO) Take 1 tablet by mouth daily. Reported on 09/28/2015     Omega-3 Fatty Acids (FISH OIL) 300 MG CAPS Take 300 mg by mouth daily.     potassium chloride  SA (KLOR-CON  M) 20 MEQ tablet Take 1 tablet (20 mEq total) by mouth once a week. 13 tablet 3   Saline (SIMPLY SALINE) 0.9 % AERS Place 2 each into the nose as directed.  Use nightly for sinus hygiene long-term.  Can also be used as many times daily as desired to assist with clearing congested sinuses. 127 mL 11   TRUEplus Lancets 33G MISC USE TO CHECK SUGAR BEFORE MEALS 3 TIMES DAILY 300 each 3   Upadacitinib  ER (RINVOQ ) 15 MG TB24 Take 1 tablet (15 mg total) by mouth daily. 30 tablet 11   No current facility-administered medications for this visit.     Objective:  BP 130/88 (BP Location: Left Arm, Patient Position: Sitting, Cuff Size: Normal)   Pulse 94   Temp (!) 97.4 F (36.3 C) (Temporal)   Ht 5' 10 (1.778 m)   Wt 227 lb 12.8 oz (103.3 kg)   SpO2 94%   BMI 32.69 kg/m  Gen: NAD, resting comfortably CV: RRR no murmurs rubs or gallops Lungs: CTAB no  crackles, wheeze, rhonchi Ext: no edema Skin: warm, dry     Assessment and Plan    # Unintentional weight loss # Pulmonary nodules S: Patient's weight as high as 265 in January and now as low as 227 within 9 months.  On his home scales January 1 was 252 and down this month to 223. But he was still 254 in April before going to 234 in may then 244 in June.  -of note has had loose stools and changed to metformin  ER lately. On imodium to help as well -Scheduled for colonoscopy in 2 weeks -He has mild anemia with last hemoglobin of 11.99 days ago but this was a mild improvement from 11.6 to 8 weeks ago  A1c up some to 7.6 but thinks related to steroid shots he needed for joint issues  Of note at last visit in August I have encouraged patient to schedule follow-up with Dr. Darlean as were planning on repeat CT of chest but he has not had a chance to schedule it yet as he assumed he was going to get a call from them A/P: I am concerned about patient's 30 pound weight loss since April especially in light of pulmonary nodules.  I directly reached out to Dr. Darlean and he was okay with me proceeding forward with CT of chest without contrast as long as I keep him in the loop.  We have ordered this as stat - Has had pretty extensive blood work lately-CRP and sed rate were elevated which could be related to ulcerative colitis with upcoming colonoscopy on 1 hand but on the other hand I told patient directly I had some concern about malignancy with the lung-regardless both will be addressed with upcoming colonoscopy and getting the CT done - Offered repeat blood work but we thought it would be # probably to get the CT scan and then reconsider this given recent blood work that has been completed   #hypertension S: medication: amlodipine  5 mg, hctz 25 mg -takes potassium once a week BP Readings from Last 3 Encounters:  03/05/24 130/88  02/25/24 116/76  02/24/24 118/70  A/P: reasonabe controlled continue  current medications   #hyperlipidemia S: Medication:Atorvastatin  40 mg weekly Lab Results  Component Value Date   CHOL 157 10/07/2023   HDL 46.70 10/07/2023   LDLCALC 96 10/07/2023   LDLDIRECT 64.0 12/08/2019   TRIG 76.0 10/07/2023   CHOLHDL 3 10/07/2023  A/P: #s reasonable 0 continue current medications - could push for lower #s but with weight loss want to hold off on further adjustment  % Diabetes-follows with endocrinology now Dr. Trixie with A1c typically under 7 S: Medication:Metformin   1000 mg twice daily recently changed to ER  Lab Results  Component Value Date   HGBA1C 7.6 (H) 02/11/2024   HGBA1C 5.9 (A) 09/30/2023   HGBA1C 7.0 (A) 06/24/2023  A/P: A1c has been trending up with steroid injections-has had recent adjustment in his metformin  to hopefully help him tolerate it better but cautious about other medication changes given weight loss  #Ulcerative colitis-follows with GI S: Medication: Lialda  2.4 g twice daily, now on rinvoq  as well  -humira  lead to psoriasis -prior stelara  but still with flares A/P: Upcoming colonoscopy to assess control-did not tolerate other medication trials  Recommended follow up: Return for next already scheduled visit or sooner if needed. Future Appointments  Date Time Provider Department Center  03/10/2024  8:30 AM [facility] PREVISIT RM 53 [facility]  03/19/2024  7:00 AM [name], MD [facility]  06/29/2024 10:20 AM [name], MD [facility] None  07/13/2024 10:00 AM [facility] ANNUAL WELLNESS VISIT 1 [facility]  07/20/2024  9:00 AM [name], MD [facility]  01/11/2025  8:00 AM [name], MD [facility]    Lab/Order associations:   ICD-10-CM   1. Multiple pulmonary nodules  R91.8 CT Chest Wo Contrast    2. Unintentional weight loss  R63.4 CT Chest Wo Contrast    3. Type 2 diabetes mellitus with hyperglycemia, without long-term current use of insulin  (HCC)  E11.65      4. Universal ulcerative colitis with complication (HCC)  K51.019     5. Essential hypertension  I10       No orders of the defined types were placed in this encounter.   Return precautions advised.  Garnette Katrinka, MD

## 2024-03-06 ENCOUNTER — Ambulatory Visit: Payer: Self-pay | Admitting: Family Medicine

## 2024-03-06 ENCOUNTER — Ambulatory Visit
Admission: RE | Admit: 2024-03-06 | Discharge: 2024-03-06 | Disposition: A | Discharge status: Home / Self Care | Source: Ambulatory Visit | Attending: Family Medicine | Admitting: Family Medicine

## 2024-03-06 DIAGNOSIS — R911 Solitary pulmonary nodule: Secondary | ICD-10-CM | POA: Diagnosis not present

## 2024-03-06 DIAGNOSIS — R918 Other nonspecific abnormal finding of lung field: Secondary | ICD-10-CM

## 2024-03-06 DIAGNOSIS — R634 Abnormal weight loss: Secondary | ICD-10-CM

## 2024-03-10 ENCOUNTER — Encounter: Payer: Self-pay | Admitting: Internal Medicine

## 2024-03-10 ENCOUNTER — Ambulatory Visit (AMBULATORY_SURGERY_CENTER)

## 2024-03-10 VITALS — Ht 70.0 in | Wt 232.8 lb

## 2024-03-10 DIAGNOSIS — K51019 Ulcerative (chronic) pancolitis with unspecified complications: Secondary | ICD-10-CM

## 2024-03-10 NOTE — Progress Notes (Signed)
 No egg or soy allergy known to patient  No issues known to pt with past sedation with any surgeries or procedures Patient denies ever being told they had issues or difficulty with intubation  No FH of Malignant Hyperthermia Pt is not on diet pills Pt is not on  home 02  Pt is not on blood thinners  Pt denies issues with constipation  No A fib or A flutter Have any cardiac testing pending--no  LOA: independent Prep: spilt dose miralax   Patient's chart reviewed by Cathlyn Parsons CNRA prior to previsit and patient appropriate for the [facility].  Previsit completed and red dot placed by patient's name on their procedure day (on provider's schedule).     PV completed with patient. Prep instructions sent via mychart and home address.

## 2024-03-11 ENCOUNTER — Other Ambulatory Visit: Payer: Self-pay | Admitting: Family Medicine

## 2024-03-18 NOTE — Progress Notes (Unsigned)
 [facility] History and Physical   Primary Care Physician:  [name], MD   Reason for Procedure:    Encounter Diagnosis  Name Primary?   Universal ulcerative colitis with complication (HCC) Yes     Plan:    Colonoscopy     HPI: Angel Frazier is a 68 y.o. male with universal ulcerative colitis.  Within the last few months he has had an elevated fecal calprotectin.  He has not been symptomatic however.  He is here for reassessment of his inflammatory bowel disease and assess response to therapy.  He is currently on Rinvoq  15 mg daily. He has also experienced unintentional weight loss.  CT of the chest has demonstrated slightly larger multiple pulmonary nodules.  His hemoglobin A1c has been elevated.   Gastroenterology summary 03/14/04 colonoscopy left UC, asacol  initiated No active disease on sigmoidoscopy in 2012 Maintained on mesalamine  4.8 g daily, Asacol  HD and then Lialda  12/2012 - flare Tx prednisone  2015-16 - prednisone  required 2017-18 prednisone  3-4 x hypersensitivity with mercaptopurine  Humira  initiated spring 2020 Humira  stopped late 2020 due to psoriasis Late 2022, Stelara  begun after colonoscopy demonstrated active left and some microscopic right colitis Patient gets assistance with mesalamine  and Stelara  Colonoscopy 07/09/2022-normal TI, patchy plus diffuse mild colitis seen more pronounced in left colon and biopsies in the right colon (cecum ascending and transverse were normal left colon showed active colitis Patient called with flare late February started on prednisone  taper   Ustekinumab  levels 0.5 08/13/22 - no Abs - moved to q 4 week dosing 12/2022 improved 02/11/23 flare - neg C diff - prednisone  taper   03/1023 - Ustekinumab  levels 4.2 no abs monthly stelara    11/24 another flare and prednisone  taper   January 2025 Rinvoq  started 45 mg daily x 8 weeks then 15 mg daily   Abnormal transaminases, CT of the chest suggest fatty liver  Past  Medical History:  Diagnosis Date    vitamin D  deficiency 01/24/2013   01/23/13 - level 29 - supplements 50k IU weekly x 6 recommended    Anabolic steroid abuse    PT. DENIES 03/30/19   Arthritis    Avascular necrosis of femoral head (HCC)    bilateral   Bleeding hemorrhoids    Diabetes mellitus without complication (HCC) 2017   type 2   Drug-induced acute pancreatitis - 09/07/2015   Erectile dysfunction    Hypertension    Left sided ulcerative colitis (HCC)    Psoriasis caused by Humira  04/13/2019   Rhabdomyolysis 12/25/2018   Spinal stenosis of lumbar region at multiple levels     Past Surgical History:  Procedure Laterality Date   COLONOSCOPY     multiple   KNEE ARTHROSCOPY Right    PROSTATE BIOPSY  08/2020   Negative for cancer   SIGMOIDOSCOPY       Current Outpatient Medications  Medication Sig Dispense Refill   Alcohol  Swabs  (B-D SINGLE USE SWABS  REGULAR) PADS USE AS DIRECTED OR NEEDED 300 each 12   amLODipine  (NORVASC ) 5 MG tablet TAKE 1 TABLET AT BEDTIME 90 tablet 3   Ascorbic Acid (VITAMIN C ) 1000 MG tablet Take 1,000 mg by mouth daily. Reported on 09/28/2015     aspirin  81 MG tablet Take 81 mg by mouth daily. Reported on 09/28/2015     atorvastatin  (LIPITOR) 40 MG tablet TAKE 1 TABLET ONCE A WEEK. 13 tablet 3   Blood Glucose Calibration (TRUE METRIX LEVEL 1) Low SOLN Use as directed 1 each 1  blood glucose meter kit and supplies Dispense based on patient and insurance preference. Use up to four times daily as directed. (FOR ICD-9 250.00, 250.01). 1 each 0   Blood Glucose Monitoring Suppl (TRUE METRIX METER) w/Device KIT USE AS DIRECTED 1 kit 1   Calcium  Carbonate-Vitamin D  (CALCIUM -VITAMIN D3 PO) Take 1 tablet by mouth daily. Reported on 09/28/2015     Cyanocobalamin  (B-12 PO) 5000mg  daily     glucose blood (TRUE METRIX BLOOD GLUCOSE TEST) test strip Use as instructed to check blood sugar before meals. 300 each 12   hydrochlorothiazide  (HYDRODIURIL ) 25 MG tablet TAKE  1 TABLET EVERY DAY 90 tablet 3   LIALDA  1.2 g EC tablet TAKE TWO TABLETS BY MOUTH TWICE A DAY 360 tablet 2   metFORMIN  (GLUCOPHAGE -XR) 500 MG 24 hr tablet Take 2 tablets (1,000 mg total) by mouth 2 (two) times daily with a meal. 360 tablet 3   Multiple Vitamins-Minerals (CENTRUM SILVER PO) Take 1 tablet by mouth daily. Reported on 09/28/2015     Omega-3 Fatty Acids (FISH OIL) 300 MG CAPS Take 300 mg by mouth daily.     potassium chloride  SA (KLOR-CON  M) 20 MEQ tablet Take 1 tablet (20 mEq total) by mouth once a week. 13 tablet 3   TRUEplus Lancets 33G MISC USE TO CHECK SUGAR BEFORE MEALS 3 TIMES DAILY 300 each 3   Upadacitinib  ER (RINVOQ ) 15 MG TB24 Take 1 tablet (15 mg total) by mouth daily. 30 tablet 11   loperamide (IMODIUM A-D) 2 MG tablet Take 2 mg by mouth in the morning and at bedtime.     Current Facility-Administered Medications  Medication Dose Route Frequency Provider Last Rate Last Admin   0.9 %  sodium chloride  infusion  500 mL Intravenous Once [name], MD        Allergies as of 03/19/2024 - Review Complete 03/19/2024  Allergen Reaction Noted   Adalimumab  Rash and Dermatitis 04/13/2019   Mercaptopurine  Other (See Comments) 09/07/2015    Family History  Problem Relation Age of Onset   Diabetes Mother    Breast cancer Sister    Diabetes Other        cousin   Hypertension Other    Diabetes type I Son    Colon cancer Neg Hx    Esophageal cancer Neg Hx    Rectal cancer Neg Hx    Stomach cancer Neg Hx    Colon polyps Neg Hx     Social History   Socioeconomic History   Marital status: Single    Spouse name: Not on file   Number of children: 1   Years of education: Not on file   Highest education level: Not on file  Occupational History   Occupation: retired    Associate Professor: [employer]  Tobacco Use   Smoking status: Former    Current packs/day: 0.00    Average packs/day: 1 pack/day for 6.0 years (6.0 ttl pk-yrs)    Types: Cigarettes    Start  date: 06/04/1994    Quit date: 06/04/2000    Years since quitting: 23.8   Smokeless tobacco: Never  Vaping Use   Vaping status: Never Used  Substance and Sexual Activity   Alcohol  use: Not Currently    Comment: occasionally   Drug use: No   Sexual activity: Not Currently  Other Topics Concern   Not on file  Social History Narrative   Single never married- long term girlfriend over 30 years in 2025.  1 son 47 in 2025. 1 grandson 25 in 2025. Lives in [place]. Helped raise girlfriends children- one living daughter- 1 grandchild. One other grandchild from deceased daughter. 3 total grandkids and 1 greatgrand and another one on the way   Lives alone      Disability- low back issues   Roll up machine operator      Hobbies: enjoys tinkering in his shed, enjoys going to the gym   Social Drivers of Health   Financial Resource Strain: Low Risk  (07/08/2023)   Overall Financial Resource Strain (CARDIA)    Difficulty of Paying Living Expenses: Not hard at all  Food Insecurity: No Food Insecurity (07/08/2023)   Hunger Vital Sign    Worried About Running Out of Food in the Last Year: Never true    Ran Out of Food in the Last Year: Never true  Transportation Needs: No Transportation Needs (07/08/2023)   PRAPARE - Administrator, Civil Service (Medical): No    Lack of Transportation (Non-Medical): No  Physical Activity: Sufficiently Active (07/08/2023)   Exercise Vital Sign    Days of Exercise per Week: 4 days    Minutes of Exercise per Session: 50 min  Stress: No Stress Concern Present (07/08/2023)   [employer] of Occupational Health - Occupational Stress Questionnaire    Feeling of Stress : Not at all  Social Connections: Moderately Isolated (07/08/2023)   Social Connection and Isolation Panel    Frequency of Communication with Friends and Family: More than three times a week    Frequency of Social Gatherings with Friends and Family: More than three times a week    Attends Religious  Services: More than 4 times per year    Active Member of [employer] or Organizations: No    Attends Banker Meetings: Never    Marital Status: Never married  Intimate Partner Violence: Not At Risk (07/08/2023)   Humiliation, Afraid, Rape, and Kick questionnaire    Fear of Current or Ex-Partner: No    Emotionally Abused: No    Physically Abused: No    Sexually Abused: No    Review of Systems:  All other review of systems negative except as mentioned in the HPI.  Physical Exam: Vital signs BP (!) 150/106   Pulse 75   Temp 97.8 F (36.6 C) (Temporal)   Resp 18   Ht 5' 10 (1.778 m)   Wt 227 lb (103 kg)   SpO2 96%   BMI 32.57 kg/m   General:   Alert,  Well-developed, well-nourished, pleasant and cooperative in NAD Lungs:  Clear throughout to auscultation.   Heart:  Regular rate and rhythm; no murmurs, clicks, rubs,  or gallops. Abdomen:  Soft, nontender and nondistended. Normal bowel sounds.   Neuro/Psych:  Alert and cooperative. Normal mood and affect. A and O x 3   @Zaveon Gillen  CHARLENA Commander, MD, [facility] [phone] (pager) 03/19/2024 8:06 AM@

## 2024-03-19 ENCOUNTER — Encounter: Payer: Self-pay | Admitting: Internal Medicine

## 2024-03-19 ENCOUNTER — Ambulatory Visit (AMBULATORY_SURGERY_CENTER): Admitting: Internal Medicine

## 2024-03-19 VITALS — BP 107/65 | HR 69 | Temp 97.8°F | Resp 18 | Ht 70.0 in | Wt 227.0 lb

## 2024-03-19 DIAGNOSIS — I10 Essential (primary) hypertension: Secondary | ICD-10-CM | POA: Diagnosis not present

## 2024-03-19 DIAGNOSIS — K6389 Other specified diseases of intestine: Secondary | ICD-10-CM | POA: Diagnosis not present

## 2024-03-19 DIAGNOSIS — K648 Other hemorrhoids: Secondary | ICD-10-CM | POA: Diagnosis not present

## 2024-03-19 DIAGNOSIS — K529 Noninfective gastroenteritis and colitis, unspecified: Secondary | ICD-10-CM | POA: Diagnosis not present

## 2024-03-19 DIAGNOSIS — K56609 Unspecified intestinal obstruction, unspecified as to partial versus complete obstruction: Secondary | ICD-10-CM | POA: Diagnosis not present

## 2024-03-19 DIAGNOSIS — E119 Type 2 diabetes mellitus without complications: Secondary | ICD-10-CM | POA: Diagnosis not present

## 2024-03-19 DIAGNOSIS — K51019 Ulcerative (chronic) pancolitis with unspecified complications: Secondary | ICD-10-CM

## 2024-03-19 DIAGNOSIS — K5190 Ulcerative colitis, unspecified, without complications: Secondary | ICD-10-CM | POA: Diagnosis not present

## 2024-03-19 MED ORDER — SODIUM CHLORIDE 0.9 % IV SOLN: 500.0000 mL | Freq: Once | INTRAVENOUS | Status: DC

## 2024-03-19 NOTE — Progress Notes (Signed)
 0755 BP 150/106, Labetalol given IV, MD update, vss

## 2024-03-19 NOTE — Patient Instructions (Addendum)
 There is a 4 inch or 10 cm section of inflamed colon everything else looks great.  I took biopsies.  I think the answer is going to be to increase the Rinvoq  to 30 mg.  At least you feel well.  Let me see what the biopsies tell us  and then we will go from there.  I appreciate the opportunity to care for you. Lupita CHARLENA Commander, MD, [facility]  Resume all of your previous medications today as ordered. Read your discharge instructions. The biopsies will be back in about 2 weeks.    YOU HAD AN ENDOSCOPIC PROCEDURE TODAY AT THE [facility]:   Refer to the procedure report that was given to you for any specific questions about what was found during the examination.  If the procedure report does not answer your questions, please call your gastroenterologist to clarify.  If you requested that your care partner not be given the details of your procedure findings, then the procedure report has been included in a sealed envelope for you to review at your convenience later.  YOU SHOULD EXPECT: Some feelings of bloating in the abdomen. Passage of more gas than usual.  Walking can help get rid of the air that was put into your GI tract during the procedure and reduce the bloating. If you had a lower endoscopy (such as a colonoscopy or flexible sigmoidoscopy) you may notice spotting of blood in your stool or on the toilet paper. If you underwent a bowel prep for your procedure, you may not have a normal bowel movement for a few days.  Please Note:  You might notice some irritation and congestion in your nose or some drainage.  This is from the oxygen used during your procedure.  There is no need for concern and it should clear up in a day or so.  SYMPTOMS TO REPORT IMMEDIATELY:  Following lower endoscopy (colonoscopy or flexible sigmoidoscopy):  Excessive amounts of blood in the stool  Significant tenderness or worsening of abdominal pains  Swelling of the abdomen that is new, acute  Fever of 100F or  higher   For urgent or emergent issues, a gastroenterologist can be reached at any hour by calling [phone]. Do not use MyChart messaging for urgent concerns.    DIET:  We do recommend a small meal at first, but then you may proceed to your regular diet.  Drink plenty of fluids but you should avoid alcoholic beverages for 24 hours.  ACTIVITY:  You should plan to take it easy for the rest of today and you should NOT DRIVE or use heavy machinery until tomorrow (because of the sedation medicines used during the test).    FOLLOW UP: Our staff will call the number listed on your records the next business day following your procedure.  We will call around 7:15- 8:00 am to check on you and address any questions or concerns that you may have regarding the information given to you following your procedure. If we do not reach you, we will leave a message.     If any biopsies were taken you will be contacted by phone or by letter within the next 1-3 weeks.  Please call us  at [phone] if you have not heard about the biopsies in 3 weeks.    SIGNATURES/CONFIDENTIALITY: You and/or your care partner have signed paperwork which will be entered into your electronic medical record.  These signatures attest to the fact that that the information above on your After  Visit Summary has been reviewed and is understood.  Full responsibility of the confidentiality of this discharge information lies with you and/or your care-partner.

## 2024-03-19 NOTE — Op Note (Signed)
 [facility] Patient Name: Angel Frazier Procedure Date: 03/19/2024 7:22 AM MRN: [id number] Endoscopist: Lupita FORBES Commander , MD, [id number] Age: 68 Referring MD:  Date of Birth: April 26, 1956 Gender: Male Account #: [id number] Procedure:                Colonoscopy Indications:              Chronic ulcerative pancolitis, Follow-up of chronic                            ulcerative pancolitis, Disease activity assessment                            of chronic ulcerative pancolitis, Assess                            therapeutic response to therapy of chronic                            ulcerative pancolitis Medicines:                Monitored Anesthesia Care Procedure:                Pre-Anesthesia Assessment:                           - Prior to the procedure, a History and Physical                            was performed, and patient medications and                            allergies were reviewed. The patient's tolerance of                            previous anesthesia was also reviewed. The risks                            and benefits of the procedure and the sedation                            options and risks were discussed with the patient.                            All questions were answered, and informed consent                            was obtained. Prior Anticoagulants: The patient has                            taken no anticoagulant or antiplatelet agents. ASA                            Grade Assessment: II - A patient with mild systemic  disease. After reviewing the risks and benefits,                            the patient was deemed in satisfactory condition to                            undergo the procedure.                           After obtaining informed consent, the colonoscope                            was passed under direct vision. Throughout the                            procedure, the patient's blood pressure, pulse, and                             oxygen saturations were monitored continuously. The                            Olympus Scope SN: [id number] was introduced through                            the anus and advanced to the the terminal ileum,                            with identification of the appendiceal orifice and                            IC valve. The colonoscopy was performed without                            difficulty. The patient tolerated the procedure                            well. The quality of the bowel preparation was                            good. The terminal ileum, ileocecal valve,                            appendiceal orifice, and rectum were photographed.                            The bowel preparation used was Miralax via split                            dose instruction. Scope In: 8:15:02 AM Scope Out: 8:34:33 AM Scope Withdrawal Time: 0 hours 14 minutes 15 seconds  Total Procedure Duration: 0 hours 19 minutes 31 seconds  Findings:                 The perianal and digital rectal examinations were  normal. Pertinent negatives include normal prostate                            (size, shape, and consistency).                           The terminal ileum appeared normal.                           A continuous area of ulcerated mucosa was present                            in the sigmoid colon. 35-45 cm - decreased vacular                            pattern and patchy ulceration as in photos. Slight                            stenosis. Biopsies were taken with a cold forceps                            for histology. Verification of patient                            identification for the specimen was done. Estimated                            blood loss was minimal.                           Internal hemorrhoids were found.                           The exam was otherwise without abnormality on                            direct and retroflexion  views.                           Two biopsies were taken every 10 cm with a cold                            forceps from the entire colon for ulcerative                            colitis surveillance. These biopsy specimens from                            the 4 bottles were sent to Pathology. Verification                            of patient identification for the specimen was                            done.  Estimated blood loss was minimal. Complications:            No immediate complications. Estimated Blood Loss:     Estimated blood loss was minimal. Impression:               - The examined portion of the ileum was normal.                           - Mucosal ulceration in the sigmoid colon. 10 cm                            segment of active disease as describled above.                            Biopsied.                           - Internal hemorrhoids.                           - The examination was otherwise normal on direct                            and retroflexion views.                           - Biopsies for surveillance were taken from the                            entire colon. 2 x 10 cm - 4 bottles. Recommendation:           - Patient has a contact number available for                            emergencies. The signs and symptoms of potential                            delayed complications were discussed with the                            patient. Return to normal activities tomorrow.                            Written discharge instructions were provided to the                            patient.                           - Resume previous diet.                           - Continue present medications. I anticipate will                            need to increase Rinvoq  from 15 mg daily to 30 mg  daily pending pathology review.                           - Await pathology results.                           - Repeat colonoscopy is  recommended. The                            colonoscopy date will be determined after pathology                            results from today's exam become available for                            review. Lupita FORBES Commander, MD 03/19/2024 8:47:41 AM This report has been signed electronically.

## 2024-03-19 NOTE — Progress Notes (Signed)
 0810 BP 149/107, Labetalol given IV, MD update, vss

## 2024-03-19 NOTE — Progress Notes (Signed)
 Pt's states no medical or surgical changes since previsit or office visit.

## 2024-03-19 NOTE — Progress Notes (Signed)
 Report given to PACU, vss

## 2024-03-19 NOTE — Progress Notes (Signed)
 Called to room to assist during endoscopic procedure.  Patient ID and intended procedure confirmed with present staff. Received instructions for my participation in the procedure from the performing physician.

## 2024-03-20 ENCOUNTER — Telehealth: Payer: Self-pay | Admitting: *Deleted

## 2024-03-20 NOTE — Telephone Encounter (Signed)
  Follow up Call-     03/19/2024    7:15 AM 07/09/2022   10:00 AM  Call back number  Post procedure Call Back phone  # [phone]  Permission to leave phone message Yes No     Patient questions:  Do you have a fever, pain , or abdominal swelling? No. Pain Score  0 *  Have you tolerated food without any problems? Yes.    Have you been able to return to your normal activities? Yes.    Do you have any questions about your discharge instructions: Diet   No. Medications  No. Follow up visit  No.  Do you have questions or concerns about your Care? No.  Actions: * If pain score is 4 or above: No action needed, pain <4.

## 2024-03-23 LAB — SURGICAL PATHOLOGY

## 2024-03-31 ENCOUNTER — Ambulatory Visit: Payer: Self-pay | Admitting: Internal Medicine

## 2024-03-31 ENCOUNTER — Telehealth: Payer: Self-pay | Admitting: Internal Medicine

## 2024-03-31 DIAGNOSIS — K51019 Ulcerative (chronic) pancolitis with unspecified complications: Secondary | ICD-10-CM

## 2024-03-31 NOTE — Telephone Encounter (Signed)
 Patient has active colitis despite > 6 months Rinvoq   Need to increase maintenance dose from 15 mg every day to 30 mg qd

## 2024-04-01 ENCOUNTER — Telehealth: Payer: Self-pay

## 2024-04-01 ENCOUNTER — Other Ambulatory Visit: Payer: Self-pay

## 2024-04-01 ENCOUNTER — Other Ambulatory Visit (HOSPITAL_COMMUNITY): Payer: Self-pay

## 2024-04-01 MED ORDER — RINVOQ 30 MG PO TB24
30.0000 mg | ORAL_TABLET | Freq: Every day | ORAL | 3 refills | Status: DC
  Filled 2024-04-01: qty 90, 90d supply, fill #0

## 2024-04-01 NOTE — Telephone Encounter (Signed)
 Angel Frazier is correct right? This will go to specialty pharmacy?

## 2024-04-01 NOTE — Telephone Encounter (Signed)
 I spoke with Angel Frazier and he said he has a name written down at home , he has to find the paper he said. It is a lady that is suppose to be able to help him with this. ? Like a Hotel Manager. He is going to call us  back when he finds it.

## 2024-04-01 NOTE — Telephone Encounter (Signed)
 Pharmacy Patient Advocate Encounter   Received notification from Pt Calls Messages that prior authorization for Rinvoq  30MG  er tablets is required/requested.   Insurance verification completed.   The patient is insured through [employer].   Per test claim: Current 30 day supply co-pay is $1,900.32 Patient may be eligible for a Medicare prescription Payment plan. The patient will need to reach out to their insurance company to enrol in the payment plan to spread out their payments throughout the year, If available. This test claim was processed through [facility]- copay amounts may vary at other pharmacies due to pharmacy/plan contracts, or as the patient moves through the different stages of their insurance plan.

## 2024-04-01 NOTE — Telephone Encounter (Signed)
 Attempted to reach pt to discuss message. No answer, no VM. Pt does check my chart messages, will send a my chart message to the pt.

## 2024-04-01 NOTE — Telephone Encounter (Signed)
 PJ can you assist as well? Thank you

## 2024-04-01 NOTE — Telephone Encounter (Signed)
 PJ see message from the pt

## 2024-04-01 NOTE — Telephone Encounter (Signed)
 Per the pt, this is correct. He uses a specialty pharmacy to get his Rinvoq .

## 2024-04-01 NOTE — Telephone Encounter (Signed)
 Pt returned call. Discussed pharmacy message with him. Pt reports that he is on the assistance program for Rinvoq  and he normally gets his medications from the specialty pharmacy. I have reached out to Dr. Darilyn MA for further assistance to ensure pt gets his medications appropriately.

## 2024-04-01 NOTE — Progress Notes (Signed)
 Patient to be enrolled with [facility]. Routed to Rx Prior Auth Team (ATTN: Odilia Bennett).

## 2024-04-01 NOTE — Telephone Encounter (Signed)
 Inbound call stating the number is [phone] ext [phone].

## 2024-04-02 ENCOUNTER — Ambulatory Visit: Admitting: Internal Medicine

## 2024-04-02 ENCOUNTER — Encounter: Payer: Self-pay | Admitting: Internal Medicine

## 2024-04-02 VITALS — BP 137/92 | HR 106 | Temp 98.2°F | Ht 69.0 in | Wt 239.0 lb

## 2024-04-02 DIAGNOSIS — Z87891 Personal history of nicotine dependence: Secondary | ICD-10-CM | POA: Diagnosis not present

## 2024-04-02 DIAGNOSIS — R918 Other nonspecific abnormal finding of lung field: Secondary | ICD-10-CM

## 2024-04-02 MED ORDER — RINVOQ 30 MG PO TB24: 30.0000 mg | ORAL_TABLET | Freq: Every day | ORAL | 3 refills | Status: AC

## 2024-04-02 NOTE — Progress Notes (Unsigned)
 Subjective:     Patient ID: Angel Frazier, male   DOB: 08-27-1955,   MRN: [id number]  HPI  15  yobm quit smoking 2002 p pna with no residual symptoms and nl baseline cxr and CT ABD for ulcerative colitis  with MPN's confirmed on CT chest so referred to pulmonary clinic 09/28/2015 by Dr Avram.  09/28/2015 1st [facility] office visit/ Lynnet Hefley   No symptoms at all - Not limited by breathing from desired activities  / no cough rec CT 04/24/16 1. Stable numerous (at least 12) solid pulmonary nodules scattered throughout both lungs measuring up to 9 mm in the right lower lobe, for which 6 month stability has been demonstrated, suggesting a benign etiology.  2. Stable apical right upper lobe 1.1 cm ground-glass pulmonary nodule. Stable medial right middle lobe ground-glass and possibly cavitary 1.0 cm pulmonary nodule. Given persistence, repeat CT is recommended every 2 years until 5 years of stability   - CT chest 04/29/2017 1. Scattered solid pulmonary nodules measure 8 mm or less in size, are unchanged from 09/21/2015 and are considered benign. 2. 10 mm ground-glass nodule in the medial segment right middle lobe, stable. Follow-up in 2 years is recommended, as clinically indicated, as a low-grade adenocarcinoma cannot be excluded   - CT 05/04/2019 1. Increased size of 9 mm ground-glass nodule in medial left upper lobe. Recommend continued follow-up by chest CT without contrast in 12 months.  - CT 05/19/2020  C/w progressive met dz > - PET 06/09/2020 Persistent bilateral solid and ground-glass pulmonary nodules show low-grade FDG uptake; low-grade carcinoma cannot be excluded.  No evidence of thoracic lymph node or distant metastatic disease.  Focal FDG uptake in the right prostate peripheral zone, which may be due to high-grade prostate carcinoma or prostatitis  >>> urology referral 06/09/2020 >>>  - f/u pulmonary ov 09/07/20 to regroup      01/15/2023  f/u ov/Laryssa Hassing re: MPNs/ UC   maint on no resp rx  Seen in UC  01/07/23 tired and nauseated  x 4d with L chest discomfort >>> eval = ? 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 Pt denies sob.   Dyspnea:  Not limited by breathing from desired activities   Cough: none  Sleeping: bed is flat one one pillow resp cc  SABA use: none  02: none     No obvious day to day or daytime variability or assoc excess/ purulent sputum or mucus plugs or hemoptysis or cp or chest tightness, subjective wheeze or overt sinus or hb symptoms.    Also denies any obvious fluctuation of symptoms with weather or environmental changes or other aggravating  or alleviating factors except as outlined above   No unusual exposure hx or h/o childhood pna/ asthma or knowledge of premature birth.  Current Allergies, Complete Past Medical History, Past Surgical History, Family History, and Social History were reviewed in [employer] record.  ROS  The following are not active complaints unless bolded Hoarseness, sore throat, dysphagia, dental problems, itching, sneezing,  nasal congestion or discharge of excess mucus or purulent secretions, ear ache,   fever, chills, sweats, unintended wt loss or wt gain, classically pleuritic or exertional cp,  orthopnea pnd or arm/hand swelling  or leg swelling,  presyncope, palpitations, abdominal pain, anorexia, nausea, vomiting, diarrhea  or change in bowel habits or change in bladder habits, change in stools or change in urine, dysuria, hematuria,  rash, arthralgias, visual complaints, headache, numbness, weakness or ataxia or problems with walking or coordination,  change in mood or  memory.        Current Meds  Medication Sig   Alcohol  Swabs  (B-D SINGLE USE SWABS  REGULAR) PADS USE AS DIRECTED OR NEEDED   amLODipine  (NORVASC ) 5 MG tablet TAKE 1 TABLET AT BEDTIME   Ascorbic Acid (VITAMIN C ) 1000 MG tablet Take 1,000 mg by mouth daily. Reported on 09/28/2015   aspirin  81 MG tablet Take 81 mg by mouth daily. Reported on 09/28/2015   atorvastatin  (LIPITOR) 40 MG tablet TAKE 1 TABLET ONCE A WEEK.   Blood Glucose Calibration (TRUE METRIX LEVEL 1) Low SOLN Use as directed   blood glucose meter kit and supplies Dispense based on patient and insurance preference. Use up to four times daily as directed. (FOR ICD-9 250.00, 250.01).   Blood Glucose Monitoring Suppl (TRUE METRIX METER) w/Device KIT USE AS DIRECTED   Calcium  Carbonate-Vitamin D  (CALCIUM -VITAMIN D3 PO) Take 1 tablet by mouth daily. Reported on 09/28/2015   Cyanocobalamin  (B-12 PO) 5000mg  daily   glucose blood (TRUE METRIX BLOOD GLUCOSE TEST) test strip Use as instructed to check blood sugar before meals.   hydrochlorothiazide  (HYDRODIURIL ) 25 MG tablet TAKE 1 TABLET EVERY DAY   LIALDA  1.2 g EC tablet TAKE TWO TABLETS BY MOUTH TWICE A DAY   loperamide (IMODIUM A-D) 2 MG tablet Take 2 mg by mouth in the morning and at bedtime.   metFORMIN  (GLUCOPHAGE -XR) 500 MG 24 hr tablet Take 2 tablets (1,000 mg total) by mouth 2 (two) times daily with a meal.   Multiple Vitamins-Minerals (CENTRUM SILVER PO) Take 1 tablet by mouth daily. Reported on 09/28/2015   Omega-3 Fatty Acids (FISH OIL) 300 MG CAPS Take 300 mg by mouth daily.   potassium chloride  SA (KLOR-CON  M) 20 MEQ tablet Take 1 tablet (20 mEq total) by  mouth once a week.   TRUEplus Lancets 33G MISC USE TO CHECK SUGAR BEFORE MEALS 3 TIMES DAILY   Upadacitinib  ER (RINVOQ ) 30 MG TB24 Take 1 tablet (30 mg total) by mouth daily.                    Objective:   Physical Exam   Wts  04/02/2024     239 01/15/2023       238  06/09/2021         252   08/16/2020      256   09/28/15 243 lb (110.224 kg)  09/26/15 243 lb (110.224 kg)  09/13/15 241 lb 8 oz (109.544 kg)     Vital signs reviewed  04/02/2024  - Note at rest 02 sats  94% on RA  General appearance:   pleasant amb bm nad   HEENT : Oropharynx  clear       Nasal turbinates nl    NECK :  without  apparent JVD/ palpable Nodes/TM    LUNGS: no acc muscle use,  Nl contour chest which is clear to A and P bilaterally without cough on insp or exp maneuvers   CV:  RRR  no s3 or murmur or increase in P2, and no edema   ABD:  soft and nontender   MS:  Gait nl   ext warm without deformities Or obvious joint restrictions  calf tenderness, cyanosis or clubbing    SKIN: warm and dry without lesions    NEURO:  alert, approp, nl sensorium with  no motor or cerebellar deficits apparent.       Assessment:        Assessment & Plan Pulmonary nodules 1st detected 2017 on abd ct in pt with UC quit smoking 2002  CT 09/25/15 Multiple bilateral pulmonary nodules, measuring up to 9 mm  - CT 04/24/16 1. Stable numerous (at least 12) solid pulmonary nodules scattered throughout both lungs measuring up to 9 mm in the right lower lobe, for which 6 month stability has been demonstrated, suggesting a benign etiology.  2. Stable apical right upper lobe 1.1 cm ground-glass pulmonary nodule. Stable medial right middle lobe ground-glass and possibly cavitary 1.0 cm pulmonary nodule. Given persistence, repeat CT is recommended every 2 years until 5 years of stability   - CT chest 04/29/2017 1. Scattered solid pulmonary nodules measure 8 mm or less in size, are unchanged from 09/21/2015 and are  considered benign. 2. 10 mm ground-glass nodule in the medial segment right middle lobe, stable. Follow-up in 2 years is recommended, as clinically indicated, as a low-grade adenocarcinoma cannot be excluded   - CT 05/04/2019 1. Increased size of 9 mm ground-glass nodule in medial left upper lobe. Recommend continued follow-up by chest CT without contrast in 12 months.  - CT 05/19/2020  C/w progressive met dz > - PET 06/09/2020 Persistent bilateral solid and ground-glass pulmonary nodules show low-grade FDG uptake; low-grade carcinoma cannot be excluded.  No evidence of thoracic lymph node or distant metastatic disease.  Focal FDG uptake in the right prostate peripheral zone, which may be due to high-grade prostate carcinoma or prostatitis  >>> urology referral 06/09/2020 >>> Watt f/u  CT chest 05/2021 -Several enlarging and increasingly solid pulmonary nodules bilaterally, most notably in the right middle and lower lobes. The pre-existing solid nodules are largely stable with the largest nodule in the right lower lobe decreased in size from the previous study. The findings are indeterminate for malignancy. Recommend multi disciplinary thoracic referral for further evaluation >>>PET scan  06/01/21 1. The enlarging and increasingly solid pulmonary nodules do not demonstrate any hypermetabolic activity. While reassuring, adenocarcinoma precursor not excluded.  2. Persistent focal hypermetabolic activity in the peripheral zone of the right posterior prostate apex corresponding with a PI-RADS category 4 lesion on MRI. 3. No evidence of metastatic disease. - CT 02/07/22  Waxing and waning nodules c/w inflammatory cause  - CT 02/20/23 continue wax / wane   - CT chest 06/06/23 with contrast:  no real changes - CT chest 03/06/24  slt increase in 2 of the MPNs but still < 1 cm   Discussed above results with pt in detail  - unlikely these are separate primaries and more likely than not they are benign  and related to UC  but so small can probably be safely followed at 6 m again   Discussed in detail all the  indications, usual  risks and alternatives  relative to the benefits with patient who agrees to proceed with w/u as outlined.       AVS  Patient Instructions  Plan on CT chest without contrast in 09/2024   Follow up can be as needed for pulmonary problems    Ozell America, MD 04/03/2024

## 2024-04-02 NOTE — Addendum Note (Signed)
 Addended by: NICHOLAUS JARVIS on: 04/02/2024 02:39 PM   Modules accepted: Orders

## 2024-04-02 NOTE — Patient Instructions (Signed)
 Plan on CT chest without contrast in 09/2024   Follow up can be as needed for pulmonary problems

## 2024-04-02 NOTE — Telephone Encounter (Signed)
 Called and spoke with nurse ambassador for Rinvoq . Determined that pt's Rinvoq  Rx goes through [facility], which is an [employer] [facility] Resent the Rx to the new pharmacy. While on the phone with Alfonso, the pharmacy technician, she reported that the pt's prior authorization will expire 06/03/2024. She reports that the pt can call in to renew his authorization or if he is connected via text, they will text him the appropriate questions to renew this. Attempted to reach out to patient to discuss this. No answer, left a vm for pt to return call.

## 2024-04-03 DIAGNOSIS — M7022 Olecranon bursitis, left elbow: Secondary | ICD-10-CM | POA: Diagnosis not present

## 2024-04-03 NOTE — Assessment & Plan Note (Addendum)
 1st detected 2017 on abd ct in pt with UC quit smoking 2002  CT 09/25/15 Multiple bilateral pulmonary nodules, measuring up to 9 mm  - CT 04/24/16 1. Stable numerous (at least 12) solid pulmonary nodules scattered throughout both lungs measuring up to 9 mm in the right lower lobe, for which 6 month stability has been demonstrated, suggesting a benign etiology.  2. Stable apical right upper lobe 1.1 cm ground-glass pulmonary nodule. Stable medial right middle lobe ground-glass and possibly cavitary 1.0 cm pulmonary nodule. Given persistence, repeat CT is recommended every 2 years until 5 years of stability   - CT chest 04/29/2017 1. Scattered solid pulmonary nodules measure 8 mm or less in size, are unchanged from 09/21/2015 and are considered benign. 2. 10 mm ground-glass nodule in the medial segment right middle lobe, stable. Follow-up in 2 years is recommended, as clinically indicated, as a low-grade adenocarcinoma cannot be excluded   - CT 05/04/2019 1. Increased size of 9 mm ground-glass nodule in medial left upper lobe. Recommend continued follow-up by chest CT without contrast in 12 months.  - CT 05/19/2020  C/w progressive met dz > - PET 06/09/2020 Persistent bilateral solid and ground-glass pulmonary nodules show low-grade FDG uptake; low-grade carcinoma cannot be excluded.  No evidence of thoracic lymph node or distant metastatic disease.  Focal FDG uptake in the right prostate peripheral zone, which may be due to high-grade prostate carcinoma or prostatitis  >>> urology referral 06/09/2020 >>> Watt f/u  CT chest 05/2021 -Several enlarging and increasingly solid pulmonary nodules bilaterally, most notably in the right middle and lower lobes. The pre-existing solid nodules are largely stable with the largest nodule in the right lower lobe decreased in size from the previous study. The findings are indeterminate for malignancy. Recommend multi disciplinary thoracic referral for  further evaluation >>>PET scan  06/01/21 1. The enlarging and increasingly solid pulmonary nodules do not demonstrate any hypermetabolic activity. While reassuring, adenocarcinoma precursor not excluded.  2. Persistent focal hypermetabolic activity in the peripheral zone of the right posterior prostate apex corresponding with a PI-RADS category 4 lesion on MRI. 3. No evidence of metastatic disease. - CT 02/07/22  Waxing and waning nodules c/w inflammatory cause  - CT 02/20/23 continue wax / wane   - CT chest 06/06/23 with contrast:  no real changes - CT chest 03/06/24  slt increase in 2 of the MPNs but still < 1 cm   Discussed above results with pt in detail  - unlikely these are separate primaries and more likely than not they are benign and related to UC  but so small can probably be safely followed at 6 m again   Discussed in detail all the  indications, usual  risks and alternatives  relative to the benefits with patient who agrees to proceed with w/u as outlined.

## 2024-04-03 NOTE — Telephone Encounter (Signed)
 Trayson called in and I relayed the message from [place]. He will try and contact them in mid November to get the ball rolling.

## 2024-04-16 ENCOUNTER — Telehealth: Payer: Self-pay | Admitting: Internal Medicine

## 2024-04-16 DIAGNOSIS — E1169 Type 2 diabetes mellitus with other specified complication: Secondary | ICD-10-CM

## 2024-04-16 DIAGNOSIS — K51019 Ulcerative (chronic) pancolitis with unspecified complications: Secondary | ICD-10-CM

## 2024-04-16 DIAGNOSIS — Z7960 Long term (current) use of unspecified immunomodulators and immunosuppressants: Secondary | ICD-10-CM

## 2024-04-16 NOTE — Telephone Encounter (Signed)
 He is to continue Rinvoq  30 mg daily That is new dose  As we discussed before he should dc Lialda    I need him to do fecal calprotectin, CBC, CMET and fasting lipids 3 months after beginning the 30 mg dose   Encounter Diagnoses  Name Primary?   Universal ulcerative colitis with complication (HCC) Yes   Hyperlipidemia associated with type 2 diabetes mellitus (HCC)

## 2024-04-16 NOTE — Telephone Encounter (Signed)
 Jamale informed about his Rinvoq  dosage and to d/c the Lialda . He is currently on the Rinvoq  15mg . He is going to contact [employer] about his rx. I will contact him in early February about getting the fasting labs drawn.

## 2024-04-16 NOTE — Telephone Encounter (Signed)
 Please confirm Sir, thank you.

## 2024-04-16 NOTE — Telephone Encounter (Signed)
 Thank you nothing further needed

## 2024-04-16 NOTE — Telephone Encounter (Signed)
 PT is calling to speak with a nurse regarding his Rinvoq . He wants to confirm that he will continue on the 30MG . Please advise.

## 2024-04-23 ENCOUNTER — Other Ambulatory Visit: Payer: Self-pay

## 2024-05-06 ENCOUNTER — Telehealth: Payer: Self-pay

## 2024-05-06 NOTE — Progress Notes (Signed)
 Patient appearing on report for [employer] - Hypertension Control report due to last documented ambulatory blood pressure of 137/92 on 04/02/24. Next appointment with PCP is 07/20/24.   Outreached patient to discuss hypertension control and medication management. Left voicemail for patient to return my call at their convenience.   Woodie Jock, PharmD PGY1 Pharmacy Resident  05/06/2024

## 2024-05-07 NOTE — Progress Notes (Signed)
 Patient appearing on report for [employer] - Hypertension Control report due to last documented ambulatory blood pressure of 137/92 on 04/02/2024. Next appointment with PCP is 07/20/2024.   Outreached patient to discuss hypertension control and medication management.   Current antihypertensives:  Amlodipine  5 mg daily daily Hydrochlorothiazide  25 mg daily  Patient has an automated upper arm home BP machine. He doesn't check his blood pressure often. His blood pressure cuff is not working currently, potentially needing new batteries in the machine. Unable to check BP during today's visit, would prefer to not follow up with readings.   Current blood pressure readings: N/A Current meal patterns: doesn't adhere to a specific diet, tries to bake foods rather than fry Current physical activity: walks around daily and goes to the gym weekly Patient denies hypotensive signs and symptoms including dizziness, lightheadedness.  Patient denies hypertensive symptoms including headache, chest pain, shortness of breath.   Assessment/Plan: Currently uncontrolled, reports compliance with medications, but doesn't monitor blood pressure at home often. Reviewed goal blood pressure <130/80 Reviewed appropriate home BP monitoring technique (avoid caffeine , smoking, and exercise for 30 minutes before checking, rest for at least 5 minutes before taking BP, sit with feet flat on the floor and back against a hard surface, uncross legs, and rest arm on flat surface) Discussed dietary modifications, such as reduced salt intake, focus on whole grains, vegetables, lean proteins Discussed goal of 150 minutes of moderate intensity physical activity weekly Recommend to continue medications as prescribed Completed full medication review, no current issues with adherence or cost at this time  Woodie Jock, PharmD PGY1 Pharmacy Resident  05/07/2024

## 2024-05-22 ENCOUNTER — Ambulatory Visit (HOSPITAL_COMMUNITY)
Admission: EM | Admit: 2024-05-22 | Discharge: 2024-05-22 | Disposition: A | Discharge status: Home / Self Care | Attending: Emergency Medicine | Admitting: Emergency Medicine

## 2024-05-22 ENCOUNTER — Encounter (HOSPITAL_COMMUNITY): Payer: Self-pay

## 2024-05-22 DIAGNOSIS — L03114 Cellulitis of left upper limb: Secondary | ICD-10-CM

## 2024-05-22 DIAGNOSIS — M7989 Other specified soft tissue disorders: Secondary | ICD-10-CM

## 2024-05-22 MED ORDER — CEFTRIAXONE SODIUM 1 G IJ SOLR
1.0000 g | Freq: Once | INTRAMUSCULAR | Status: AC
  Administered 2024-05-22: 1 g via INTRAMUSCULAR

## 2024-05-22 MED ORDER — CEFTRIAXONE SODIUM 1 G IJ SOLR
INTRAMUSCULAR | Status: AC
  Filled 2024-05-22: qty 10

## 2024-05-22 MED ORDER — LIDOCAINE HCL (PF) 1 % IJ SOLN
INTRAMUSCULAR | Status: AC
  Filled 2024-05-22: qty 2

## 2024-05-22 NOTE — ED Triage Notes (Addendum)
 Pt present with c/o lt hand swelling. Pt states this morning he had a fever. Pt was told by [facility]  to come in to UC if he felt feverish. Pt states he has not picked up Doxycyline yet. Pt denies any cold symptoms

## 2024-05-22 NOTE — ED Provider Notes (Signed)
 " [facility]    CSN: [id number] Arrival date & time: 05/22/24  0802      History   Chief Complaint Chief Complaint  Patient presents with   Fever    HPI Angel Frazier is a 68 y.o. male.   Patient presents with area of left hand swelling that began sometime last week.  Patient states that he was seen at [facility] yesterday for this and was started on doxycycline  to cover for possible infection.  Patient states that he has not yet picked up the doxycycline .    Patient states that this morning he felt feverish with sweats and chills and was told that he needed to follow-up immediately if he developed a fever or any worsening signs of infection. Patient states that the swelling, pain, and redness are no different from yesterday when he was seen by [facility].  The history is provided by the patient and medical records.  Fever   Past Medical History:  Diagnosis Date    vitamin D  deficiency 01/24/2013   01/23/13 - level 29 - supplements 50k IU weekly x 6 recommended    Anabolic steroid abuse    PT. DENIES 03/30/19   Arthritis    Avascular necrosis of femoral head (HCC)    bilateral   Bleeding hemorrhoids    Diabetes mellitus without complication (HCC) 2017   type 2   Drug-induced acute pancreatitis - 09/07/2015   Erectile dysfunction    Hypertension    Left sided ulcerative colitis (HCC)    Psoriasis caused by Humira  04/13/2019   Rhabdomyolysis 12/25/2018   Spinal stenosis of lumbar region at multiple levels     Patient Active Problem List   Diagnosis Date Noted   Metabolic dysfunction-associated fatty liver disease (MAFLD) suspected 11/18/2023   Former smoker 09/17/2019   Psoriasis caused by Humira  04/13/2019   Hyperlipidemia associated with type 2 diabetes mellitus (HCC) 01/20/2019   Essential hypertension 01/20/2019   Type 2 diabetes mellitus with hyperglycemia, without long-term current use of insulin  (HCC) 12/22/2018   Pulmonary nodules 09/07/2015    Long-term use of immunosuppressant medication Stelara  07/12/2015   Long term current use of systemic steroids 11/18/2014   History of avascular necrosis  11/18/2014   Vitamin D  deficiency 01/24/2013   ED (erectile dysfunction) 05/21/2012   Universal ulcerative colitis (HCC) 08/11/2008    Past Surgical History:  Procedure Laterality Date   COLONOSCOPY     multiple   KNEE ARTHROSCOPY Right    PROSTATE BIOPSY  08/2020   Negative for cancer   SIGMOIDOSCOPY         Home Medications    Prior to Admission medications  Medication Sig Start Date End Date Taking? Authorizing Provider  Alcohol  Swabs  (B-D SINGLE USE SWABS  REGULAR) PADS USE AS DIRECTED OR NEEDED 09/27/22   [name], MD  amLODipine  (NORVASC ) 5 MG tablet TAKE 1 TABLET AT BEDTIME 08/05/23   [name], MD  Ascorbic Acid (VITAMIN C ) 1000 MG tablet Take 1,000 mg by mouth daily. Reported on 09/28/2015    [provider]  aspirin  81 MG tablet Take 81 mg by mouth daily. Reported on 09/28/2015    [provider]  atorvastatin  (LIPITOR) 40 MG tablet TAKE 1 TABLET ONCE A WEEK. 03/11/24   [name], MD  Blood Glucose Calibration (TRUE METRIX LEVEL 1) Low SOLN Use as directed 03/21/20   [name], MD  blood glucose meter kit and supplies Dispense based on patient and insurance  preference. Use up to four times daily as directed. (FOR ICD-9 250.00, 250.01). 04/09/17   [name], MD  Blood Glucose Monitoring Suppl (TRUE METRIX METER) w/Device KIT USE AS DIRECTED 11/25/20   [name], MD  Calcium  Carbonate-Vitamin D  (CALCIUM -VITAMIN D3 PO) Take 1 tablet by mouth daily. Reported on 09/28/2015    [provider]  Cyanocobalamin  (B-12 PO) 5000mg  daily    [provider]  glucose blood (TRUE METRIX BLOOD GLUCOSE TEST) test strip Use as instructed to check blood sugar before meals. 03/14/22   [name], MD  hydrochlorothiazide  (HYDRODIURIL ) 25 MG tablet TAKE 1 TABLET EVERY  DAY 10/01/23   [name], MD  loperamide (IMODIUM A-D) 2 MG tablet Take 2 mg by mouth in the morning and at bedtime.    [provider]  metFORMIN  (GLUCOPHAGE -XR) 500 MG 24 hr tablet Take 2 tablets (1,000 mg total) by mouth 2 (two) times daily with a meal. 02/24/24   [name], MD  Multiple Vitamins-Minerals (CENTRUM SILVER PO) Take 1 tablet by mouth daily. Reported on 09/28/2015    [provider]  Omega-3 Fatty Acids (FISH OIL) 300 MG CAPS Take 300 mg by mouth daily.    [provider]  potassium chloride  SA (KLOR-CON  M) 20 MEQ tablet Take 1 tablet (20 mEq total) by mouth once a week. 07/03/23   [name], MD  TRUEplus Lancets 33G MISC USE TO CHECK SUGAR BEFORE MEALS 3 TIMES DAILY 09/25/22   [name], MD  Upadacitinib  ER (RINVOQ ) 30 MG TB24 Take 1 tablet (30 mg total) by mouth daily. 04/02/24   [name], MD    Family History Family History  Problem Relation Age of Onset   Diabetes Mother    Breast cancer Sister    Diabetes Other        cousin   Hypertension Other    Diabetes type I Son    Colon cancer Neg Hx    Esophageal cancer Neg Hx    Rectal cancer Neg Hx    Stomach cancer Neg Hx    Colon polyps Neg Hx     Social History Social History[1]   Allergies   Adalimumab  and Mercaptopurine    Review of Systems Review of Systems  Constitutional:  Positive for fever.   Per HPI  Physical Exam Triage Vital Signs ED Triage Vitals  Encounter Vitals Group     BP 05/22/24 0822 (!) 144/103     Girls Systolic BP Percentile --      Girls Diastolic BP Percentile --      Boys Systolic BP Percentile --      Boys Diastolic BP Percentile --      Pulse Rate 05/22/24 0821 83     Resp 05/22/24 0821 16     Temp 05/22/24 0821 97.9 F (36.6 C)     Temp src --      SpO2 05/22/24 0821 94 %     Weight --      Height --      Head Circumference --      Peak Flow --      Pain Score 05/22/24 0820 4     Pain Loc --      Pain  Education --      Exclude from Growth Chart --    No data found.  Updated Vital Signs BP (!) 144/103   Pulse 83   Temp 97.9 F (36.6 C)   Resp 16  SpO2 94%   Visual Acuity Right Eye Distance:   Left Eye Distance:   Bilateral Distance:    Right Eye Near:   Left Eye Near:    Bilateral Near:     Physical Exam Vitals and nursing note reviewed.  Constitutional:      General: He is awake. He is not in acute distress.    Appearance: Normal appearance. He is well-developed and well-groomed. He is not ill-appearing.  Musculoskeletal:     Left hand: Swelling and tenderness present. Normal range of motion. Normal strength. Normal sensation. There is no disruption of two-point discrimination. Normal capillary refill. Normal pulse.       Hands:     Comments: Area of swelling measuring approximately 3 x 3 cm just over the dorsal joint of left hand.  Tenderness and erythema noted to this area.  Skin:    General: Skin is warm and dry.  Neurological:     Mental Status: He is alert.  Psychiatric:        Behavior: Behavior is cooperative.      UC Treatments / Results  Labs (all labs ordered are listed, but only abnormal results are displayed) Labs Reviewed - No data to display  EKG   Radiology No results found.  Procedures Procedures (including critical care time)  Medications Ordered in UC Medications  cefTRIAXone  (ROCEPHIN ) injection 1 g (has no administration in time range)    Initial Impression / Assessment and Plan / UC Course  I have reviewed the triage vital signs and the nursing notes.  Pertinent labs & imaging results that were available during my care of the patient were reviewed by me and considered in my medical decision making (see chart for details).     Patient is overall well-appearing.  Vitals are stable.  No presence of fever in clinic today.  Exam findings concerning for suspected cellulitis or underlying infection.  Given IM Rocephin  in clinic for  initial treatment of this.  Recommended patient pick up previously prescribed doxycycline  and start taking this as prescribed.  Discussed follow-up, return, and strict ER precautions. Final Clinical Impressions(s) / UC Diagnoses   Final diagnoses:  Hand swelling  Cellulitis of hand, left     Discharge Instructions      You were given an injection of Rocephin  in clinic today for initial treatment of cellulitis. Please pick up your previously prescribed doxycycline  as soon as you leave here and start taking this twice daily as prescribed. Keep your appointment with [facility] on Monday for follow-up of this issue. If you develop a true fever, weakness, or increased swelling or spreading of redness please seek immediate medical treatment in the emergency department.   ED Prescriptions   None    PDMP not reviewed this encounter.    [1]  Social History Tobacco Use   Smoking status: Former    Current packs/day: 0.00    Average packs/day: 1 pack/day for 6.0 years (6.0 ttl pk-yrs)    Types: Cigarettes    Start date: 06/04/1994    Quit date: 06/04/2000    Years since quitting: 23.9   Smokeless tobacco: Never  Vaping Use   Vaping status: Never Used  Substance Use Topics   Alcohol  use: Not Currently    Comment: occasionally   Drug use: No     Johnie Rumaldo LABOR, NP 05/22/24 [phone]  "

## 2024-05-22 NOTE — Discharge Instructions (Signed)
 You were given an injection of Rocephin  in clinic today for initial treatment of cellulitis. Please pick up your previously prescribed doxycycline  as soon as you leave here and start taking this twice daily as prescribed. Keep your appointment with [facility] on Monday for follow-up of this issue. If you develop a true fever, weakness, or increased swelling or spreading of redness please seek immediate medical treatment in the emergency department.

## 2024-05-23 ENCOUNTER — Other Ambulatory Visit: Payer: Self-pay | Admitting: Family Medicine

## 2024-05-26 ENCOUNTER — Emergency Department (HOSPITAL_COMMUNITY)
Admission: EM | Admit: 2024-05-26 | Discharge: 2024-05-26 | Disposition: A | Discharge status: Home / Self Care | Source: Ambulatory Visit | Attending: Emergency Medicine | Admitting: Emergency Medicine

## 2024-05-26 ENCOUNTER — Emergency Department (HOSPITAL_COMMUNITY)

## 2024-05-26 DIAGNOSIS — M7989 Other specified soft tissue disorders: Secondary | ICD-10-CM | POA: Insufficient documentation

## 2024-05-26 DIAGNOSIS — Z7982 Long term (current) use of aspirin: Secondary | ICD-10-CM | POA: Diagnosis not present

## 2024-05-26 DIAGNOSIS — Z79899 Other long term (current) drug therapy: Secondary | ICD-10-CM | POA: Diagnosis not present

## 2024-05-26 DIAGNOSIS — M79642 Pain in left hand: Secondary | ICD-10-CM | POA: Diagnosis not present

## 2024-05-26 DIAGNOSIS — Z7984 Long term (current) use of oral hypoglycemic drugs: Secondary | ICD-10-CM | POA: Insufficient documentation

## 2024-05-26 DIAGNOSIS — E119 Type 2 diabetes mellitus without complications: Secondary | ICD-10-CM | POA: Diagnosis not present

## 2024-05-26 DIAGNOSIS — I10 Essential (primary) hypertension: Secondary | ICD-10-CM | POA: Diagnosis not present

## 2024-05-26 LAB — CBC WITH DIFFERENTIAL/PLATELET
Abs Immature Granulocytes: 0.02 K/uL (ref 0.00–0.07)
Basophils Absolute: 0 K/uL (ref 0.0–0.1)
Basophils Relative: 0 %
Eosinophils Absolute: 0 K/uL (ref 0.0–0.5)
Eosinophils Relative: 0 %
HCT: 41.6 % (ref 39.0–52.0)
Hemoglobin: 13.9 g/dL (ref 13.0–17.0)
Immature Granulocytes: 0 %
Lymphocytes Relative: 26 %
Lymphs Abs: 1.7 K/uL (ref 0.7–4.0)
MCH: 29.9 pg (ref 26.0–34.0)
MCHC: 33.4 g/dL (ref 30.0–36.0)
MCV: 89.5 fL (ref 80.0–100.0)
Monocytes Absolute: 0.7 K/uL (ref 0.1–1.0)
Monocytes Relative: 11 %
Neutro Abs: 4.1 K/uL (ref 1.7–7.7)
Neutrophils Relative %: 63 %
Platelets: 296 K/uL (ref 150–400)
RBC: 4.65 MIL/uL (ref 4.22–5.81)
RDW: 13 % (ref 11.5–15.5)
WBC: 6.6 K/uL (ref 4.0–10.5)
nRBC: 0 % (ref 0.0–0.2)

## 2024-05-26 LAB — BASIC METABOLIC PANEL WITH GFR
Anion gap: 15 (ref 5–15)
BUN: 22 mg/dL (ref 8–23)
CO2: 27 mmol/L (ref 22–32)
Calcium: 10.6 mg/dL — ABNORMAL HIGH (ref 8.9–10.3)
Chloride: 94 mmol/L — ABNORMAL LOW (ref 98–111)
Creatinine, Ser: 1.14 mg/dL (ref 0.61–1.24)
GFR, Estimated: >60 mL/min
Glucose, Bld: 112 mg/dL — ABNORMAL HIGH (ref 70–99)
Potassium: 4.1 mmol/L (ref 3.5–5.1)
Sodium: 136 mmol/L (ref 135–145)

## 2024-05-26 MED ORDER — HYDROCODONE-ACETAMINOPHEN 5-325 MG PO TABS
1.0000 | ORAL_TABLET | Freq: Once | ORAL | Status: AC
  Administered 2024-05-26: 1 via ORAL
  Filled 2024-05-26: qty 1

## 2024-05-26 MED ORDER — NAPROXEN 500 MG PO TABS: 500.0000 mg | ORAL_TABLET | Freq: Two times a day (BID) | ORAL | 0 refills | Status: AC

## 2024-05-26 NOTE — Discharge Instructions (Signed)
 It was a pleasure taking care of you today. As discussed, your labs were reassuring. I am sending you home with medication to treat possible gout. I have included the number of the rheumatologist. Call to schedule an appointment. Return to the ER for new or worsening symptoms.

## 2024-05-26 NOTE — ED Provider Notes (Signed)
 "  EMERGENCY DEPARTMENT AT [facility] Provider Note   CSN: [id number] Arrival date & time: 05/26/24  1428     Patient presents with: Hand Pain   Angel Frazier is a 68 y.o. male with a past medical history significant for hypertension, ulcerative colitis, and diabetes who presents to the ED due to persistent left hand swelling x 1 week.  Patient has been seen by urgent care and [facility] for same complaint and was diagnosed with possible cellulitis.  Patient has been taking doxycycline  with no improvement in edema.  Patient notes edema has stayed the same since being on doxycycline  and has not worsened. Denies any injury to left hand.  Patient is right-hand dominant.  Had a subjective fever a few days ago however, none since.  No objective fever.  Notes he has had intermittent polyarthralgia since August. Started in the left ankle then knee and elbow.  No history of gout.  Denies penile discharge.  No concern for STIs.  No recent tick bites.  History obtained from patient and past medical records. No interpreter used during encounter.      Prior to Admission medications  Medication Sig Start Date End Date Taking? Authorizing Provider  naproxen  (NAPROSYN ) 500 MG tablet Take 1 tablet (500 mg total) by mouth 2 (two) times daily with a meal for 3 days. 05/26/24 05/29/24 Yes Maddalynn Barnard, Aleck C, PA-C  Alcohol  Swabs  (B-D SINGLE USE SWABS  REGULAR) PADS USE AS DIRECTED OR NEEDED 09/27/22   [name], MD  amLODipine  (NORVASC ) 5 MG tablet TAKE 1 TABLET AT BEDTIME 05/25/24   [name], MD  Ascorbic Acid (VITAMIN C ) 1000 MG tablet Take 1,000 mg by mouth daily. Reported on 09/28/2015    [provider]  aspirin  81 MG tablet Take 81 mg by mouth daily. Reported on 09/28/2015    [provider]  atorvastatin  (LIPITOR) 40 MG tablet TAKE 1 TABLET ONCE A WEEK. 03/11/24   [name], MD  Blood Glucose Calibration (TRUE METRIX LEVEL 1) Low SOLN Use as  directed 03/21/20   [name], MD  blood glucose meter kit and supplies Dispense based on patient and insurance preference. Use up to four times daily as directed. (FOR ICD-9 250.00, 250.01). 04/09/17   [name], MD  Blood Glucose Monitoring Suppl (TRUE METRIX METER) w/Device KIT USE AS DIRECTED 11/25/20   [name], MD  Calcium  Carbonate-Vitamin D  (CALCIUM -VITAMIN D3 PO) Take 1 tablet by mouth daily. Reported on 09/28/2015    [provider]  Cyanocobalamin  (B-12 PO) 5000mg  daily    [provider]  glucose blood (TRUE METRIX BLOOD GLUCOSE TEST) test strip Use as instructed to check blood sugar before meals. 03/14/22   [name], MD  hydrochlorothiazide  (HYDRODIURIL ) 25 MG tablet TAKE 1 TABLET EVERY DAY 10/01/23   [name], MD  loperamide (IMODIUM A-D) 2 MG tablet Take 2 mg by mouth in the morning and at bedtime.    [provider]  metFORMIN  (GLUCOPHAGE -XR) 500 MG 24 hr tablet Take 2 tablets (1,000 mg total) by mouth 2 (two) times daily with a meal. 02/24/24   [name], MD  Multiple Vitamins-Minerals (CENTRUM SILVER PO) Take 1 tablet by mouth daily. Reported on 09/28/2015    [provider]  Omega-3 Fatty Acids (FISH OIL) 300 MG CAPS Take 300 mg by mouth daily.    [provider]  potassium chloride  SA (KLOR-CON  M) 20 MEQ tablet Take 1 tablet (20 mEq  total) by mouth once a week. 07/03/23   [name], MD  TRUEplus Lancets 33G MISC USE TO CHECK SUGAR BEFORE MEALS 3 TIMES DAILY 09/25/22   [name], MD  Upadacitinib  ER (RINVOQ ) 30 MG TB24 Take 1 tablet (30 mg total) by mouth daily. 04/02/24   [name], MD    Allergies: Adalimumab  and Mercaptopurine     Review of Systems  Musculoskeletal:  Positive for arthralgias and joint swelling.    Updated Vital Signs BP 110/73 (BP Location: Left Arm)   Pulse 98   Temp 98 F (36.7 C) (Oral)   Resp 18   SpO2 93%   Physical Exam Vitals and nursing  note reviewed.  Constitutional:      General: He is not in acute distress.    Appearance: He is not ill-appearing.  HENT:     Head: Normocephalic.  Eyes:     Pupils: Pupils are equal, round, and reactive to light.  Cardiovascular:     Rate and Rhythm: Normal rate and regular rhythm.     Pulses: Normal pulses.     Heart sounds: Normal heart sounds. No murmur heard.    No friction rub. No gallop.  Pulmonary:     Effort: Pulmonary effort is normal.     Breath sounds: Normal breath sounds.  Abdominal:     General: Abdomen is flat. There is no distension.     Palpations: Abdomen is soft.     Tenderness: There is no abdominal tenderness. There is no guarding or rebound.  Musculoskeletal:        General: Normal range of motion.     Cervical back: Neck supple.     Comments: Edema to left hand. Decreased ROM of fingers due to pain. Radial pulse intact. See photos below.   Skin:    General: Skin is warm and dry.  Neurological:     General: No focal deficit present.     Mental Status: He is alert.  Psychiatric:        Mood and Affect: Mood normal.        Behavior: Behavior normal.        (all labs ordered are listed, but only abnormal results are displayed) Labs Reviewed  BASIC METABOLIC PANEL WITH GFR - Abnormal; Notable for the following components:      Result Value   Chloride 94 (*)    Glucose, Bld 112 (*)    Calcium  10.6 (*)    All other components within normal limits  CBC WITH DIFFERENTIAL/PLATELET    EKG: None  Radiology: DG Hand Complete Left Result Date: 05/26/2024 CLINICAL DATA:  Possible infection. EXAM: LEFT HAND - COMPLETE 3+ VIEW COMPARISON:  None Available. FINDINGS: No acute fracture or dislocation. The bones are well mineralized. No significant arthritic changes. There is diffuse soft tissue swelling. No radiopaque foreign object or soft tissue gas. IMPRESSION: 1. No acute fracture or dislocation. 2. Diffuse soft tissue swelling. Electronically Signed    By: Vanetta Chou M.D.   On: 05/26/2024 18:34     Procedures   Medications Ordered in the ED  HYDROcodone -acetaminophen  (NORCO/VICODIN) 5-325 MG per tablet 1 tablet (1 tablet Oral Given 05/26/24 1622)    Clinical Course as of 05/26/24 1855  Tue May 26, 2024  1657 WBC: 6.6 [CA]    Clinical Course User Index [CA] Lorelle Aleck BROCKS, PA-C  Medical Decision Making Amount and/or Complexity of Data Reviewed External Data Reviewed: notes. Labs: ordered. Decision-making details documented in ED Course. Radiology: ordered and independent interpretation performed. Decision-making details documented in ED Course.  Risk Prescription drug management.   This patient presents to the ED for concern of hand edema, this involves an extensive number of treatment options, and is a complaint that carries with it a high risk of complications and morbidity.  The differential diagnosis includes cellulitis, septic joint, gout, bony fracture, etc  68 year old male presents to the ED due to persistent left hand edema.  Has been evaluated by [facility] and urgent care with concerns about possible cellulitis.  Currently on doxycycline  with no improvement in edema.  Has had intermittent joint pain and edema since August in the ankle, knee, and elbow.  No history of gout.  No concern for STIs.  Upon arrival patient tachycardic at 109.  Afebrile.  Does have edema to left hand with decreased range of motion of finger secondary to pain.  Radial pulse intact.  See photos above.  X-ray ordered.  Routine labs ordered.  Questionable infection versus gout.  Denies  concerns for STIs so lower suspicion for disseminated gonorrhea. Low suspicion for tenosynovitis. Discussed with Dr. Bernard who evaluated patient at bedside and agrees with assessment and plan.   CBC with no leukocytosis.  Normal hemoglobin.  BMP reassuring.  Normal renal function.  X-ray personally reviewed and interpreted  which is negative for any bony fracture.  Does demonstrate diffuse soft tissue edema. Unclear what is causing patient's edema. Will add treatment for gout in addition to his antibiotic.  Denies any worsening of edema since being on the antibiotics, so I do not feel this is treatment failure.  Rheumatology referral given to patient at discharge and advised to call to schedule an appointment for further evaluation.  Patient stable for discharge. Strict ED precautions discussed with patient. Patient states understanding and agrees to plan. Patient discharged home in no acute distress and stable vitals  Co morbidities that complicate the patient evaluation  DM  Social Determinants of Health:  Elderly >65  Test / Admission - Considered:  Considered admission; however did not feel patient needs to be admitted for IV antibiotics at this time. No streaking up the arm. Not worsening since being on antibiotics. Normal WBC. Possible gout? Feel patient is stable for outpatient treatment. Discussed with Dr. Bernard who evaluated patient at bedside and agrees with assessment and plan.       Final diagnoses:  Swelling of left hand    ED Discharge Orders          Ordered    naproxen  (NAPROSYN ) 500 MG tablet  2 times daily with meals        05/26/24 1829               Tyrese Capriotti C, PA-C 05/26/24 DANIAL    Bernard Drivers, MD 05/30/24 1422  "

## 2024-05-26 NOTE — ED Triage Notes (Signed)
 Pt c/o L hand swelling for approximately one week. Denies known injury. Pt states that he was seen at [facility] and had w/u for cellulitis. Pt states that he's also had intermittent joint swelling and pain since August to other areas. No known hx of gout.

## 2024-05-27 ENCOUNTER — Telehealth: Payer: Self-pay

## 2024-05-27 NOTE — Telephone Encounter (Signed)
 Transition Care Management Unsuccessful Follow-up Telephone Call  Date of discharge and from where:  05/26/24 [facility]  Attempts:  1st Attempt  Reason for unsuccessful TCM follow-up call:  Left voice message; LVM for patient to complete TOC call  Advised to call our office if needing to schedule an appointment. Pt advised to contact [facility] for appt and given referral per ED notes.

## 2024-06-15 ENCOUNTER — Ambulatory Visit: Admitting: Family Medicine

## 2024-06-15 ENCOUNTER — Encounter: Payer: Self-pay | Admitting: Family Medicine

## 2024-06-15 VITALS — BP 128/78 | HR 80 | Temp 97.8°F | Ht 69.0 in | Wt 247.0 lb

## 2024-06-15 DIAGNOSIS — E1165 Type 2 diabetes mellitus with hyperglycemia: Secondary | ICD-10-CM

## 2024-06-15 DIAGNOSIS — R2232 Localized swelling, mass and lump, left upper limb: Secondary | ICD-10-CM

## 2024-06-15 DIAGNOSIS — I10 Essential (primary) hypertension: Secondary | ICD-10-CM

## 2024-06-15 DIAGNOSIS — M659 Unspecified synovitis and tenosynovitis, unspecified site: Secondary | ICD-10-CM

## 2024-06-15 DIAGNOSIS — Z7984 Long term (current) use of oral hypoglycemic drugs: Secondary | ICD-10-CM | POA: Diagnosis not present

## 2024-06-15 DIAGNOSIS — M2550 Pain in unspecified joint: Secondary | ICD-10-CM

## 2024-06-15 MED ORDER — COLCHICINE 0.6 MG PO TABS: 0.3000 mg | ORAL_TABLET | Freq: Every day | ORAL | 0 refills | Status: AC

## 2024-06-15 NOTE — Progress Notes (Signed)
 " Phone [phone] In person visit   Subjective:   Angel Frazier is a 69 y.o. year old very pleasant male patient who presents for/with See problem oriented charting Chief Complaint  Patient presents with   Hospitalization Follow-up    Pt would like to go over recent labs and hand swelling; referral for rheumatology;     Past Medical History-  Patient Active Problem List   Diagnosis Date Noted   Type 2 diabetes mellitus with hyperglycemia, without long-term current use of insulin  (HCC) 12/22/2018    Priority: High   Long-term use of immunosuppressant medication Stelara  07/12/2015    Priority: High   Universal ulcerative colitis (HCC) 08/11/2008    Priority: High   Hyperlipidemia associated with type 2 diabetes mellitus (HCC) 01/20/2019    Priority: Medium    Essential hypertension 01/20/2019    Priority: Medium    Pulmonary nodules 09/07/2015    Priority: Medium    Former smoker 09/17/2019    Priority: Low   Long term current use of systemic steroids 11/18/2014    Priority: Low   History of avascular necrosis  11/18/2014    Priority: Low   Vitamin D  deficiency 01/24/2013    Priority: Low   ED (erectile dysfunction) 05/21/2012    Priority: Low   Metabolic dysfunction-associated fatty liver disease (MAFLD) suspected 11/18/2023   Psoriasis caused by Humira  04/13/2019    Medications- reviewed and updated Current Outpatient Medications  Medication Sig Dispense Refill   Alcohol  Swabs  (B-D SINGLE USE SWABS  REGULAR) PADS USE AS DIRECTED OR NEEDED 300 each 12   amLODipine  (NORVASC ) 5 MG tablet TAKE 1 TABLET AT BEDTIME 90 tablet 3   Ascorbic Acid (VITAMIN C ) 1000 MG tablet Take 1,000 mg by mouth daily. Reported on 09/28/2015     aspirin  81 MG tablet Take 81 mg by mouth daily. Reported on 09/28/2015     atorvastatin  (LIPITOR) 40 MG tablet TAKE 1 TABLET ONCE A WEEK. 13 tablet 3   Blood Glucose Calibration (TRUE METRIX LEVEL 1) Low SOLN Use as directed 1 each 1   blood glucose  meter kit and supplies Dispense based on patient and insurance preference. Use up to four times daily as directed. (FOR ICD-9 250.00, 250.01). 1 each 0   Blood Glucose Monitoring Suppl (TRUE METRIX METER) w/Device KIT USE AS DIRECTED 1 kit 1   Calcium  Carbonate-Vitamin D  (CALCIUM -VITAMIN D3 PO) Take 1 tablet by mouth daily. Reported on 09/28/2015     colchicine  0.6 MG tablet Take 0.5-1 tablets (0.3-0.6 mg total) by mouth daily. Take 1 tablet at first sign of gout attack and then half tablet daily until resolved (adjusted down due to being on atorvastatin ) 30 tablet 0   Cyanocobalamin  (B-12 PO) 5000mg  daily     glucose blood (TRUE METRIX BLOOD GLUCOSE TEST) test strip Use as instructed to check blood sugar before meals. 300 each 12   hydrochlorothiazide  (HYDRODIURIL ) 25 MG tablet TAKE 1 TABLET EVERY DAY 90 tablet 3   loperamide (IMODIUM A-D) 2 MG tablet Take 2 mg by mouth in the morning and at bedtime.     metFORMIN  (GLUCOPHAGE -XR) 500 MG 24 hr tablet Take 2 tablets (1,000 mg total) by mouth 2 (two) times daily with a meal. 360 tablet 3   Multiple Vitamins-Minerals (CENTRUM SILVER PO) Take 1 tablet by mouth daily. Reported on 09/28/2015     Omega-3 Fatty Acids (FISH OIL) 300 MG CAPS Take 300 mg by mouth daily.     potassium chloride  SA (  KLOR-CON  M) 20 MEQ tablet Take 1 tablet (20 mEq total) by mouth once a week. 13 tablet 3   TRUEplus Lancets 33G MISC USE TO CHECK SUGAR BEFORE MEALS 3 TIMES DAILY 300 each 3   Upadacitinib  ER (RINVOQ ) 30 MG TB24 Take 1 tablet (30 mg total) by mouth daily. 90 tablet 3   No current facility-administered medications for this visit.     Objective:  BP 128/78 (BP Location: Left Arm, Patient Position: Sitting, Cuff Size: Normal)   Pulse 80   Temp 97.8 F (36.6 C) (Temporal)   Ht 5' 9 (1.753 m)   Wt 247 lb (112 kg)   SpO2 95%   BMI 36.48 kg/m  Gen: NAD, resting comfortably CV: RRR no murmurs rubs or gallops Lungs: CTAB no crackles, wheeze, rhonchi Ext: trace  edema, left third MCP significantly swollen and mildly tender to palpation-he reports much improved Skin: warm, dry     Assessment and Plan   # Emergency department follow-up-left third MCP swelling and pain with concern for gout S: Patient presented with atraumatic left hand swelling on 05/22/2024 to urgent care.  He had been seen by [facility] the day prior (x-rays with soft tissue swelling only-with pain noted dorsal middle finger MCP) and was started on doxycycline  to cover for potential infection but he had not yet picked this up.  The day of urgent care he felt feverish with sweats and chills but the redness and swelling in his hand thankfully have not worsened.  They gave him a single dose of IM ceftriaxone  as they were still concerned for cellulitis and recommended starting the doxycycline   He saw [facility] back on 05/25/2024.  The updated blood work had not come back yet  but later did and noted  rheumatoid factor was negative as well as anti-CCP.  Uric acid was high normal at 8.  Sedimentation rate does not appear to have been submitted.  White blood cell count was not elevated.  Lyme screen was negative.  ANA was negative.  CRP was not elevated.  Unfortunately his symptoms were not improving and they recommended to go to the emergency department.  They were considering prednisone  for gout as he cannot take NSAIDs  Labs in emergency department were reassuring including no elevation white blood count.  They were concerned for inflammatory arthritis or gout.  Updated hand x-ray with no acute fracture or dislocation but only diffuse soft tissue swelling.  He was given a dose of hydrocodone  given his level of pain.  They noted no history of gout.  No concern for STIs so doubted disseminated gonorrhea.  They doubted tenosynovitis.  Given no improvement on antibiotic but also no worsening they did not call this a treatment failure.  They transitioned  to treatment of gout and sent him home with  naproxen  which is less than ideal given his ulcerative colitis history on 1 hand but on the other hand they were cautious about using prednisone  it appears due to last A1c of 7.6  He took the naproxen   and noted gradual improvement in pain but still joint remains swollen and he has had a hard time with grip strength as as requested result- area remains swollen though no longer as painful and no longer red.   Also noted pretty intense ankle pain sometime after the hand started and reports in Emergency Department had to use crutches   Also reports brother diagnosed with gout A/P: At last physical in August patient was dealing with some gout  left ankle pain-we have considered gout but held off on uric acid at that time as he was in acute flare and plan to see orthopedics later that day.  Recurrence of the left ankle pain as well as now significant left third MCP swelling even after flare seems to have calmed down.  Multiple sets of x-rays without fracture or dislocation or bony mass -I strongly suspect gout and offered adjustments as below and treatment but he would prefer to get expert opinion from rheumatology and with ongoing struggle in the last month we did a urgent referral-she would really like to get some answers at this point  From [facility]   Patient Instructions  I am concerned for gout and we discussed uric acid lowering agents as well as a medicine like colchicine  if you had a flare- you wanted to hold off for now  on lowering uric acid (but we are starting colchicine  if you have a flare)and get expert rheumatology opinion- we have referred you to [facility] Dr. Curt- a rheumatologist. If you do not hear within a week please call them directly   We considered losartan instead of hydrochlorothiazide  as this would lower gout risk but you wanted to see what DrRONITA Curt thinks [facility] [address] [place], [place]  [address] [phone]  Recommended follow up: Return for next already scheduled visit or sooner if needed.      #hypertension S: medication: amlodipine  5 mg, hctz 25 mg -takes potassium once a week Home readings #s: often 130s BP Readings from Last 3 Encounters:  06/15/24 128/78  05/26/24 110/73  05/22/24 (!) 144/103  A/P: blood pressure controlled but we considered lowering hydrochlorothiazide  dose due to recent probable gout- he wanted to get rheumatology opinion first  % Diabetes-follows with endocrinology now Dr. Trixie with A1c typically under 7 S: Medication:Metformin  1000 mg twice daily now ER--takes B12 while on this Lab Results  Component Value Date   HGBA1C 7.6 (H) 02/11/2024   HGBA1C 5.9 (A) 09/30/2023   HGBA1C 7.0 (A) 06/24/2023  A/P: Mild poor control at last visit-for now continue current medication as she has tried and increase time at the gym but some recent setback with his hand.  Hoping levels have improved-upcoming visit with Dr. Genice could use prednisone  if A1c levels have improved for potential gout or pseudogout  Recommended follow up: Return for next already scheduled visit or sooner if needed. Future Appointments  Date Time Provider Department Center  06/29/2024 10:20 AM [name], MD [facility] None  07/13/2024 10:00 AM [facility] ANNUAL WELLNESS VISIT 1 [facility]  07/20/2024  9:00 AM [name], MD [facility]  09/04/2024 10:30 AM [facility] 1 [facility]  01/11/2025  8:00 AM [name], MD [facility]    Lab/Order associations:   ICD-10-CM   1. Synovitis  M65.90 Ambulatory referral to Rheumatology    CANCELED: Ambulatory referral to Rheumatology    CANCELED: Ambulatory referral to Rheumatology    CANCELED: Ambulatory referral to Rheumatology    2. Arthralgia, unspecified joint  M25.50 Ambulatory referral to Rheumatology    CANCELED: Ambulatory referral to Rheumatology     CANCELED: Ambulatory referral to Rheumatology    CANCELED: Ambulatory referral to Rheumatology    3. Type 2 diabetes mellitus with hyperglycemia, without long-term current use of insulin  (HCC)  E11.65     4. Essential hypertension  I10       Meds ordered this encounter  Medications   colchicine  0.6 MG tablet    Sig: Take 0.5-1 tablets (0.3-0.6 mg total) by mouth daily. Take 1 tablet at first sign of gout attack and then half tablet daily until resolved (adjusted down due to being on atorvastatin )    Dispense:  30 tablet    Refill:  0    Return precautions advised.  Garnette Lukes, MD  "

## 2024-06-15 NOTE — Patient Instructions (Addendum)
 I am concerned for gout and we discussed uric acid lowering agents as well as a medicine like colchicine  if you had a flare- you wanted to hold off for now  on lowering uric acid (but we are starting colchicine  if you have another flare)and get expert rheumatology opinion- we have referred you to [facility] associates Dr. Pennelope- a rheumatologist. If you do not hear within a week please call them directly   We considered losartan instead of hydrochlorothiazide  as this would lower gout risk but you wanted to see what DrRONITA Setter thinks [facility] [address] [place], [place] [address] [phone]  Recommended follow up: Return for next already scheduled visit or sooner if needed.

## 2024-06-16 ENCOUNTER — Telehealth: Payer: Self-pay | Admitting: Family Medicine

## 2024-06-16 NOTE — Telephone Encounter (Signed)
 Copied from CRM #[id number]. Topic: Referral - Question >> Jun 16, 2024  7:58 AM Robinson DEL wrote: Reason for CRM: Santana with [facility] calling stating they received referral for patient via fax and states it was 43 pages, only pages 1-27 came through. Santana states they need rest of documents.  [facility] [phone] Ext [phone] Fax

## 2024-06-17 ENCOUNTER — Telehealth: Payer: Self-pay | Admitting: Family Medicine

## 2024-06-17 NOTE — Telephone Encounter (Signed)
 Please handle referral to rheumatology. Thanks.   Copied from CRM [id number]. Topic: Referral - Question >> Jun 17, 2024 11:55 AM Angel Frazier wrote: Reason for CRM: Patient was referred to Dr. Cindy Setter [facility], states he called them today and they said do not have the referral. Is requesting it to be refaxed so he can schedule asap. Fax: [phone]  Patient can be reached at [phone]

## 2024-06-19 NOTE — Telephone Encounter (Signed)
 Copied from CRM #[id number]. Topic: Referral - Question >> Jun 16, 2024  7:58 AM Robinson DEL wrote: Reason for CRM: Santana with [facility] calling stating they received referral for patient via fax and states it was 43 pages, only pages 1-27 came through. Santana states they need rest of documents.  [facility] [phone] Ext [phone] Fax >> Jun 19, 2024  7:41 AM Deleta RAMAN wrote: Following up regarding referral request missing pages 28-43. Please fax to penny at [facility] [phone]

## 2024-06-24 ENCOUNTER — Telehealth: Payer: Self-pay | Admitting: Family Medicine

## 2024-06-24 NOTE — Telephone Encounter (Signed)
 Copied from CRM #[id number]. Topic: Referral - Question >> Jun 24, 2024 12:05 PM Chasity T wrote: Reason for CRM: Santana from [facility] is calling to follow up on requested fax for the remainder of the referral for patient pages 28-43.

## 2024-06-25 NOTE — Telephone Encounter (Signed)
 This has been faxed to [facility] as requested.

## 2024-06-29 ENCOUNTER — Ambulatory Visit: Admitting: Internal Medicine

## 2024-06-29 NOTE — Telephone Encounter (Signed)
 Referral shows it was resent on  1/22

## 2024-07-01 ENCOUNTER — Telehealth: Payer: Self-pay

## 2024-07-01 DIAGNOSIS — E1169 Type 2 diabetes mellitus with other specified complication: Secondary | ICD-10-CM

## 2024-07-01 DIAGNOSIS — K51019 Ulcerative (chronic) pancolitis with unspecified complications: Secondary | ICD-10-CM

## 2024-07-01 NOTE — Telephone Encounter (Signed)
 I called Penny at [phone] ext [phone] and eft a message. I faxed all referral records again with a cover letter instructing them to call me directly at [phone] to confirm receipt.

## 2024-07-01 NOTE — Telephone Encounter (Signed)
 I spoke with Angel Frazier and explained the plan below. I will put the orders in today. He is going to try and come get the stool kit so he can have it to return when he comes for labs late February. He understands to be fasting.

## 2024-07-01 NOTE — Telephone Encounter (Signed)
-----   Message from [facility] Tonia Avino J sent at 04/16/2024  1:20 PM EST ----- Needs: fecal calprotectin, CBC, CMET , fasting lipid panel 3 months after starting Rinvoq  30mg  so probably late Feb. 2026

## 2024-07-01 NOTE — Telephone Encounter (Signed)
 Copied from CRM #[id number]. Topic: Referral - Question >> Jul 01, 2024  7:56 AM Deleta RAMAN wrote: Angel Frazier is calling due to pages 28-43 is missing from request. She has not received any updates from request on 1/13. Unable to schedule patient due to missing  pages

## 2024-07-07 ENCOUNTER — Ambulatory Visit: Admitting: Internal Medicine

## 2024-07-13 ENCOUNTER — Ambulatory Visit: Payer: Medicare HMO

## 2024-07-20 ENCOUNTER — Ambulatory Visit: Admitting: Family Medicine

## 2024-07-30 ENCOUNTER — Ambulatory Visit: Admitting: Internal Medicine

## 2024-09-04 ENCOUNTER — Other Ambulatory Visit

## 2025-01-11 ENCOUNTER — Encounter: Admitting: Family Medicine
